# Patient Record
Sex: Female | Born: 1965 | Race: White | Hispanic: No | Marital: Married | Smoking: Never smoker
Health system: Southern US, Community
[De-identification: ages and names within clinical notes are randomized; demographics above are authoritative.]

## PROBLEM LIST (undated history)

## (undated) DIAGNOSIS — Z1389 Encounter for screening for other disorder: Secondary | ICD-10-CM

## (undated) DIAGNOSIS — Z8052 Family history of malignant neoplasm of bladder: Secondary | ICD-10-CM

## (undated) DIAGNOSIS — Z800 Family history of malignant neoplasm of digestive organs: Secondary | ICD-10-CM

## (undated) DIAGNOSIS — C801 Malignant (primary) neoplasm, unspecified: Secondary | ICD-10-CM

## (undated) DIAGNOSIS — Z1371 Encounter for nonprocreative screening for genetic disease carrier status: Secondary | ICD-10-CM

## (undated) DIAGNOSIS — C50919 Malignant neoplasm of unspecified site of unspecified female breast: Secondary | ICD-10-CM

## (undated) DIAGNOSIS — D649 Anemia, unspecified: Secondary | ICD-10-CM

## (undated) DIAGNOSIS — Z803 Family history of malignant neoplasm of breast: Secondary | ICD-10-CM

## (undated) DIAGNOSIS — D249 Benign neoplasm of unspecified breast: Secondary | ICD-10-CM

## (undated) DIAGNOSIS — L570 Actinic keratosis: Secondary | ICD-10-CM

## (undated) HISTORY — DX: Family history of malignant neoplasm of breast: Z80.3

## (undated) HISTORY — DX: Actinic keratosis: L57.0

## (undated) HISTORY — DX: Encounter for screening for other disorder: Z13.89

## (undated) HISTORY — DX: Malignant neoplasm of unspecified site of unspecified female breast: C50.919

## (undated) HISTORY — PX: DILATION AND CURETTAGE OF UTERUS: SHX78

## (undated) HISTORY — DX: Family history of malignant neoplasm of bladder: Z80.52

## (undated) HISTORY — DX: Benign neoplasm of unspecified breast: D24.9

## (undated) HISTORY — PX: MASTECTOMY: SHX3

## (undated) HISTORY — DX: Encounter for nonprocreative screening for genetic disease carrier status: Z13.71

## (undated) HISTORY — DX: Family history of malignant neoplasm of digestive organs: Z80.0

---

## 2004-04-04 ENCOUNTER — Ambulatory Visit: Payer: Self-pay | Admitting: Internal Medicine

## 2005-01-08 ENCOUNTER — Ambulatory Visit: Payer: Self-pay

## 2005-01-08 IMAGING — MG UNKNOWN MG STUDY
1 series · 4 of 4 positions shown · non-contrast
Comparison: none

REASON FOR EXAM: Screening  Dr EPSOLAM  Report to EPSOLAM
COMMENTS:

[R CC · right · 4 of 4 slices shown]
[im 1/4]
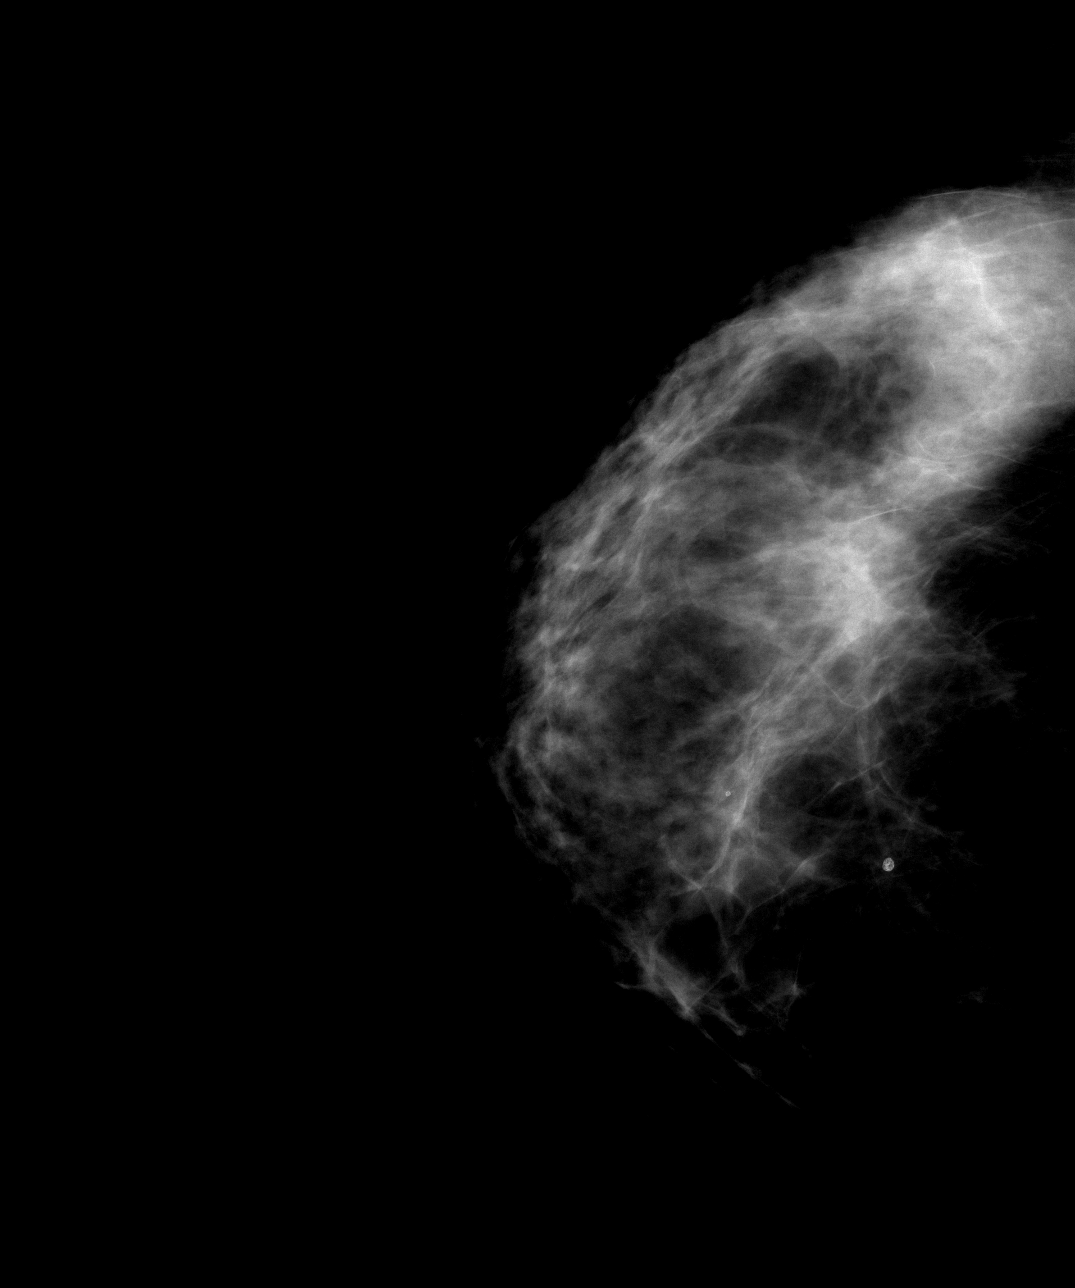
[im 2/4]
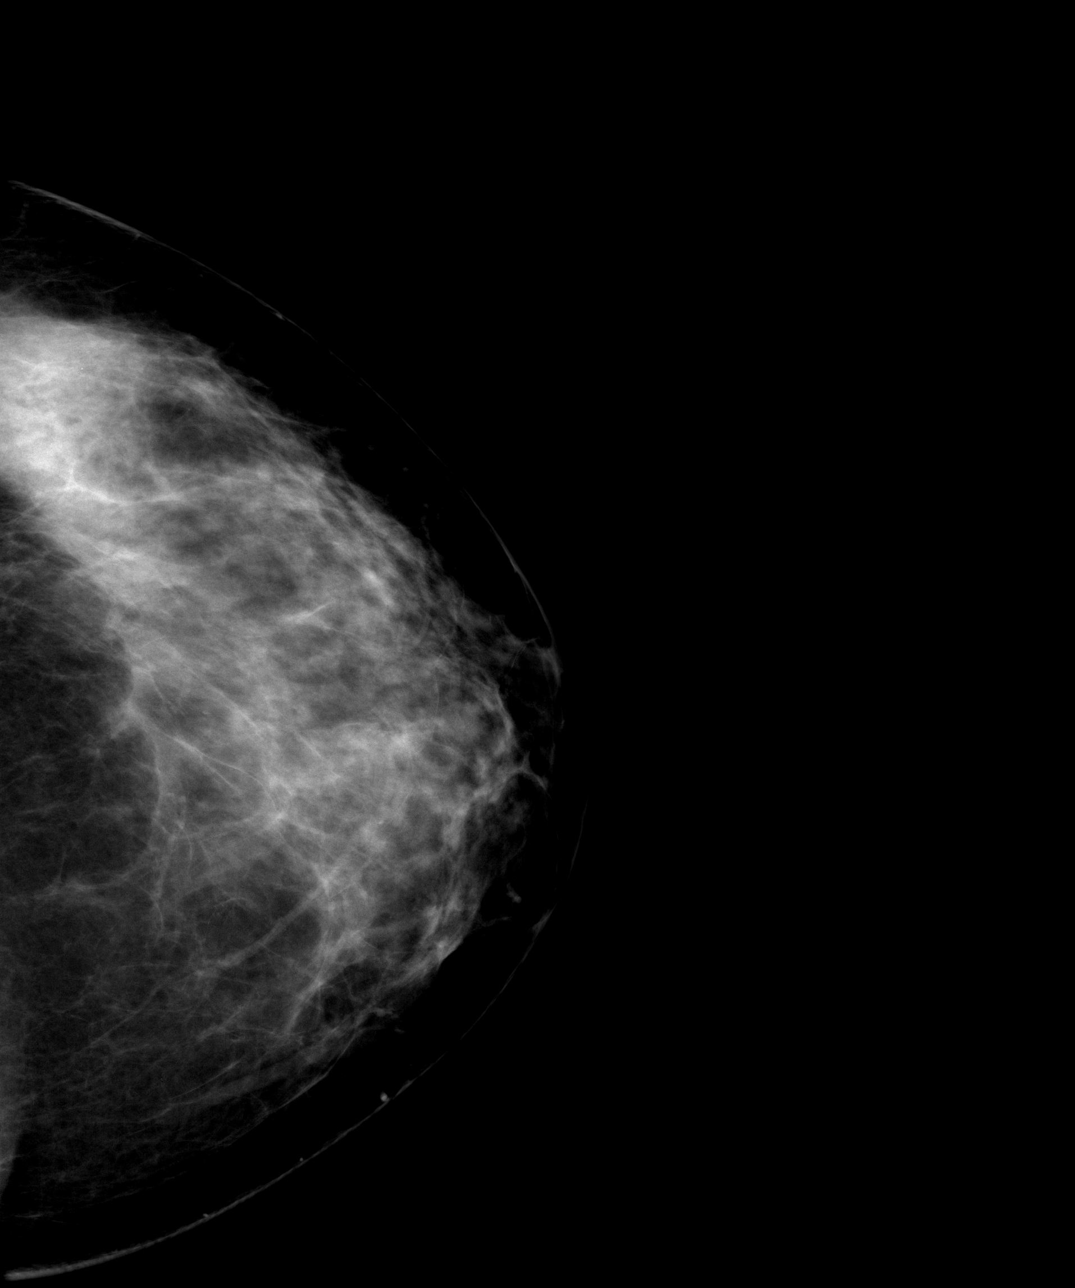
[im 3/4]
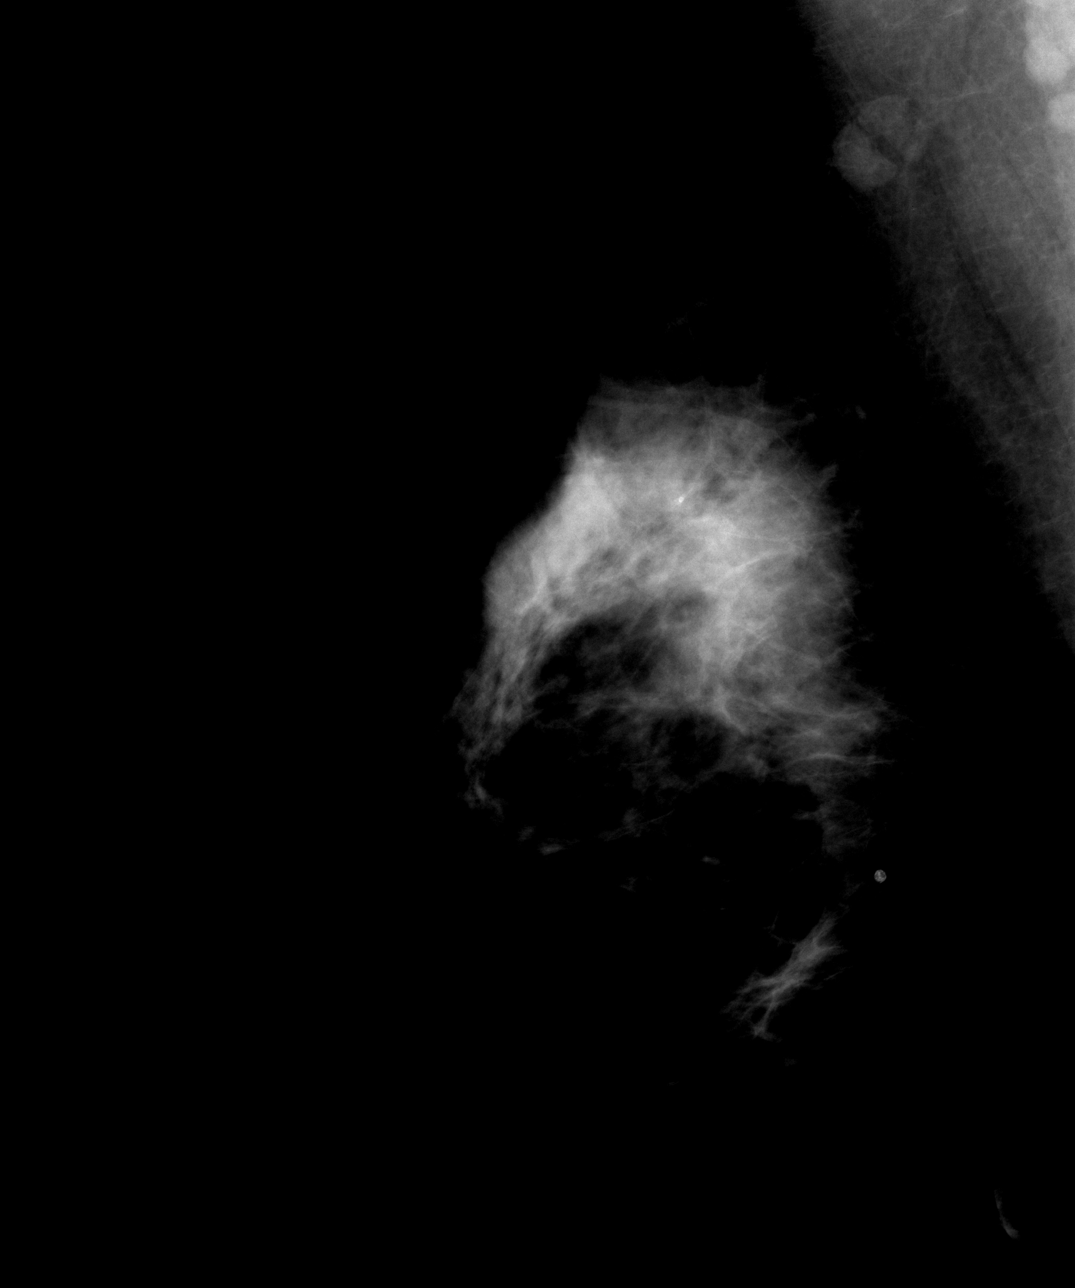
[im 4/4]
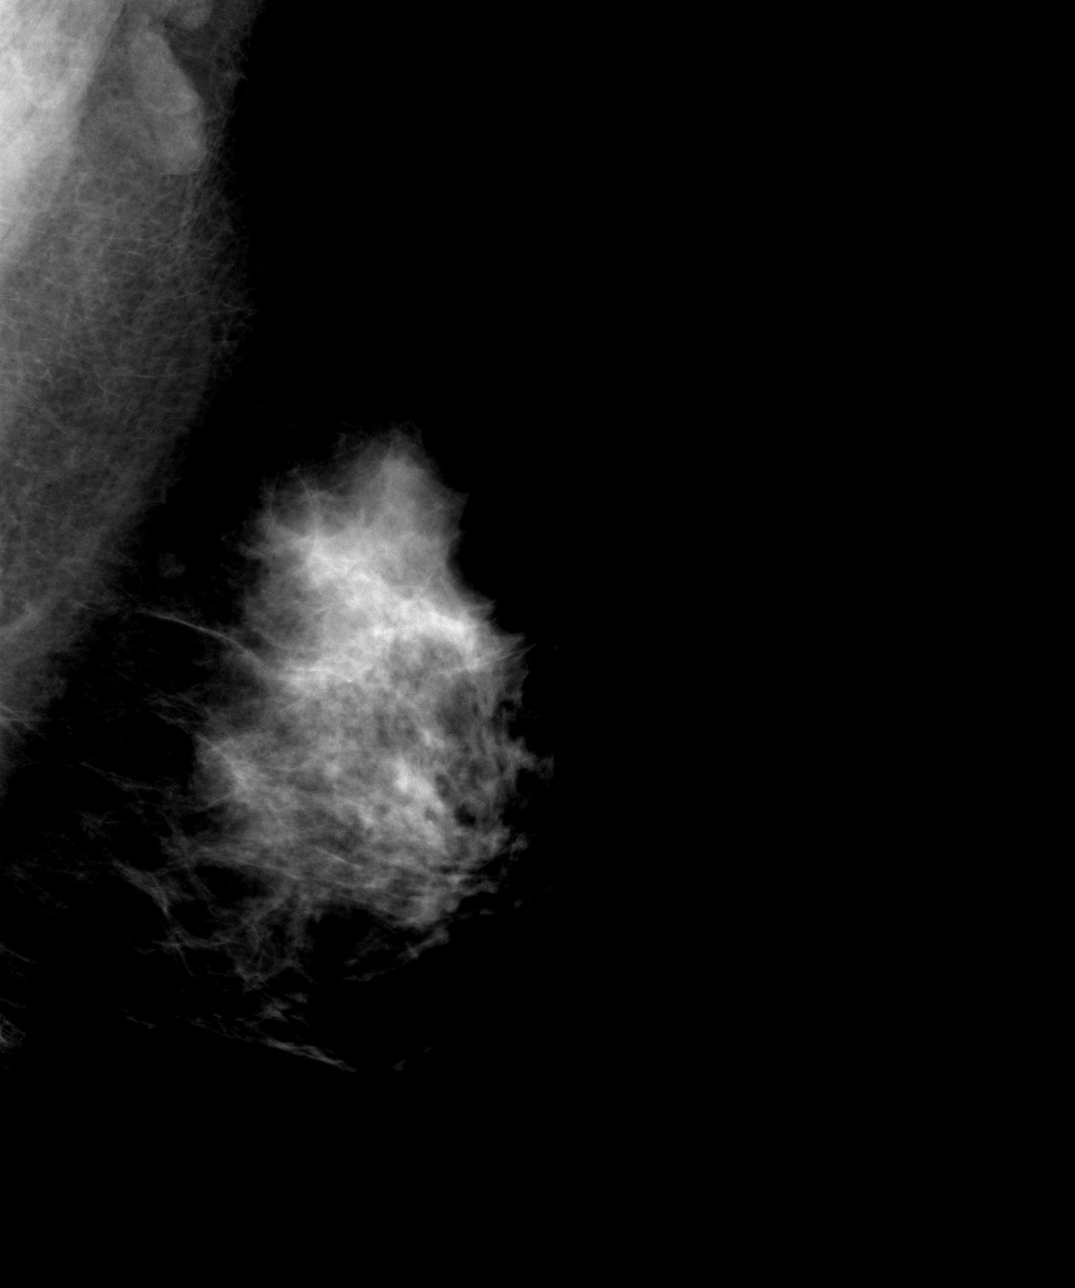

[4 of 4 positions shown; findings below may reference images not displayed]

PROCEDURE:     MAM - MAM DGTL SCREENING MAMMO W/CAD  - [DATE]  [DATE]

RESULT:     Mild fullness in the upper outer aspect of the RIGHT breast is
noted.  It is suggested that compression spot films be obtained for further
evaluation. If need be ultrasound can also be obtained.  A small nodule
noted in the upper aspect of the LEFT breast is stable, consistent with
intramammary lymph node.
IMPRESSION: 1)Increased density in the upper outer aspect of the RIGHT breast for which
compression spot films and if need ultrasound is suggested for further
evaluation.

BI-RADS: Category 0-Needs Additional Imaging Evaluation

A NEGATIVE MAMMOGRAM REPORT DOES NOT PRECLUDE BIOPSY OR OTHER EVALUATION OF
A CLINICALLY PALPABLE OR OTHERWISE SUSPICIOUS MASS OR LESION.  BREAST CANCER
MAY NOT BE DETECTED BY MAMMOGRAPHY IN UP TO 10% OF CASES.

## 2006-03-18 ENCOUNTER — Ambulatory Visit: Payer: Self-pay

## 2007-05-24 ENCOUNTER — Ambulatory Visit: Payer: Self-pay

## 2008-05-24 ENCOUNTER — Ambulatory Visit: Payer: Self-pay

## 2009-05-25 HISTORY — PX: ABDOMINAL HYSTERECTOMY: SHX81

## 2009-06-18 ENCOUNTER — Ambulatory Visit: Payer: Self-pay

## 2009-09-10 ENCOUNTER — Ambulatory Visit: Payer: Self-pay

## 2009-09-17 ENCOUNTER — Ambulatory Visit: Payer: Self-pay

## 2010-09-03 ENCOUNTER — Ambulatory Visit: Payer: Self-pay

## 2011-05-26 DIAGNOSIS — Z1389 Encounter for screening for other disorder: Secondary | ICD-10-CM

## 2011-05-26 DIAGNOSIS — Z803 Family history of malignant neoplasm of breast: Secondary | ICD-10-CM

## 2011-05-26 DIAGNOSIS — D249 Benign neoplasm of unspecified breast: Secondary | ICD-10-CM

## 2011-05-26 HISTORY — DX: Encounter for screening for other disorder: Z13.89

## 2011-05-26 HISTORY — PX: BREAST BIOPSY: SHX20

## 2011-05-26 HISTORY — DX: Family history of malignant neoplasm of breast: Z80.3

## 2011-05-26 HISTORY — DX: Benign neoplasm of unspecified breast: D24.9

## 2011-10-02 ENCOUNTER — Ambulatory Visit: Payer: Self-pay

## 2011-10-02 IMAGING — MG MAM DGTL SCREENING MAMMO W/CAD
1 series · 4 of 4 positions shown · non-contrast
Comparison: [DATE], [DATE], [DATE], [DATE].

REASON FOR EXAM: screening
COMMENTS:  Submitted by practice: [facility] Scheduled by user: AKIHIRU
AKIHIRU

PROCEDURE:     MAM - MAM DGTL SCREENING MAMMO W/CAD  - [DATE]  [DATE]
RESULT:

[Series 6275: R CC · right · 4 of 4 slices shown]
[im 1/4]
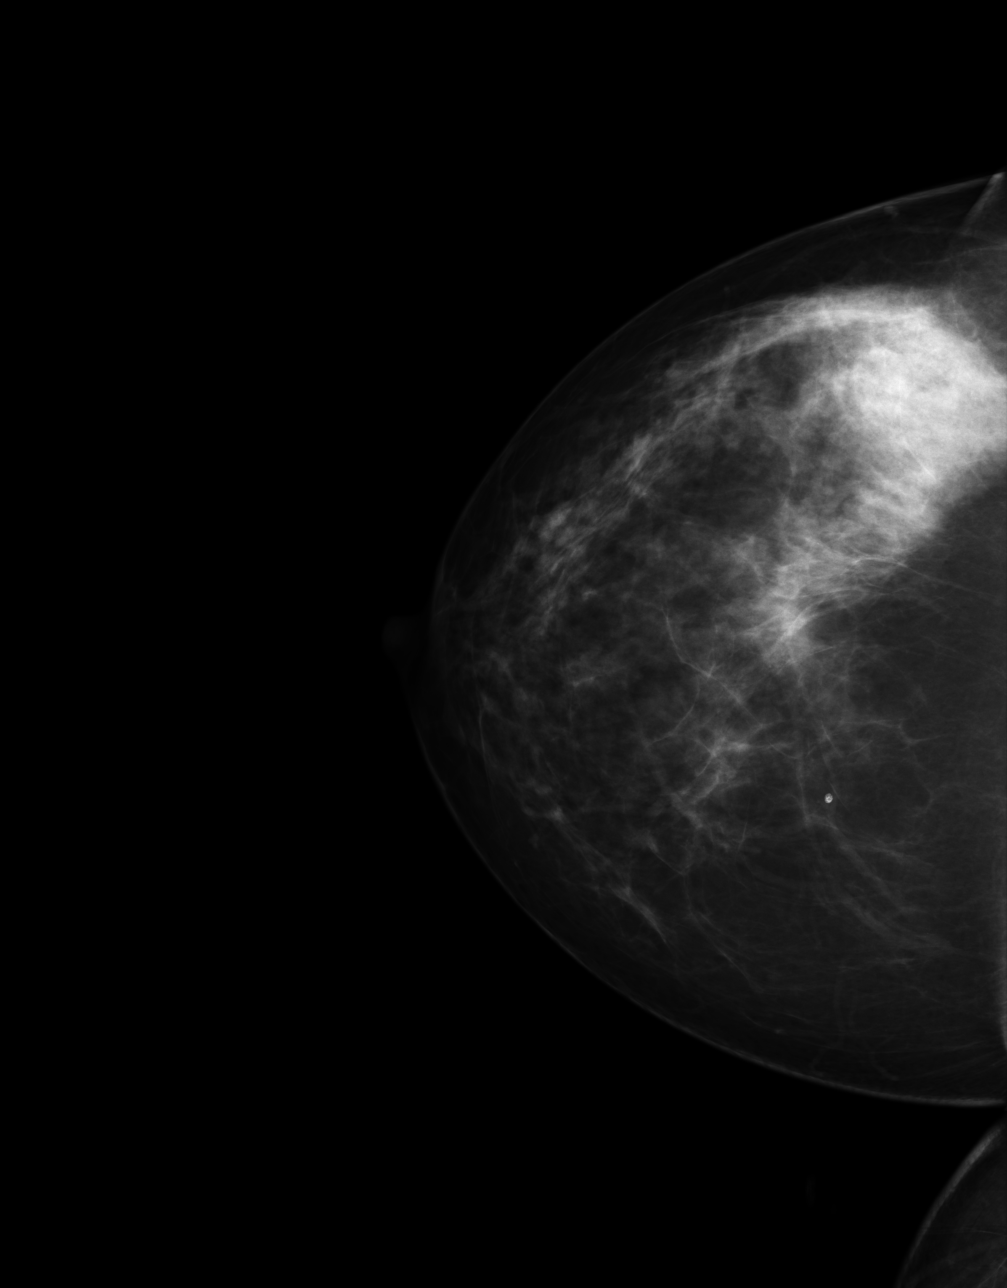
[im 2/4]
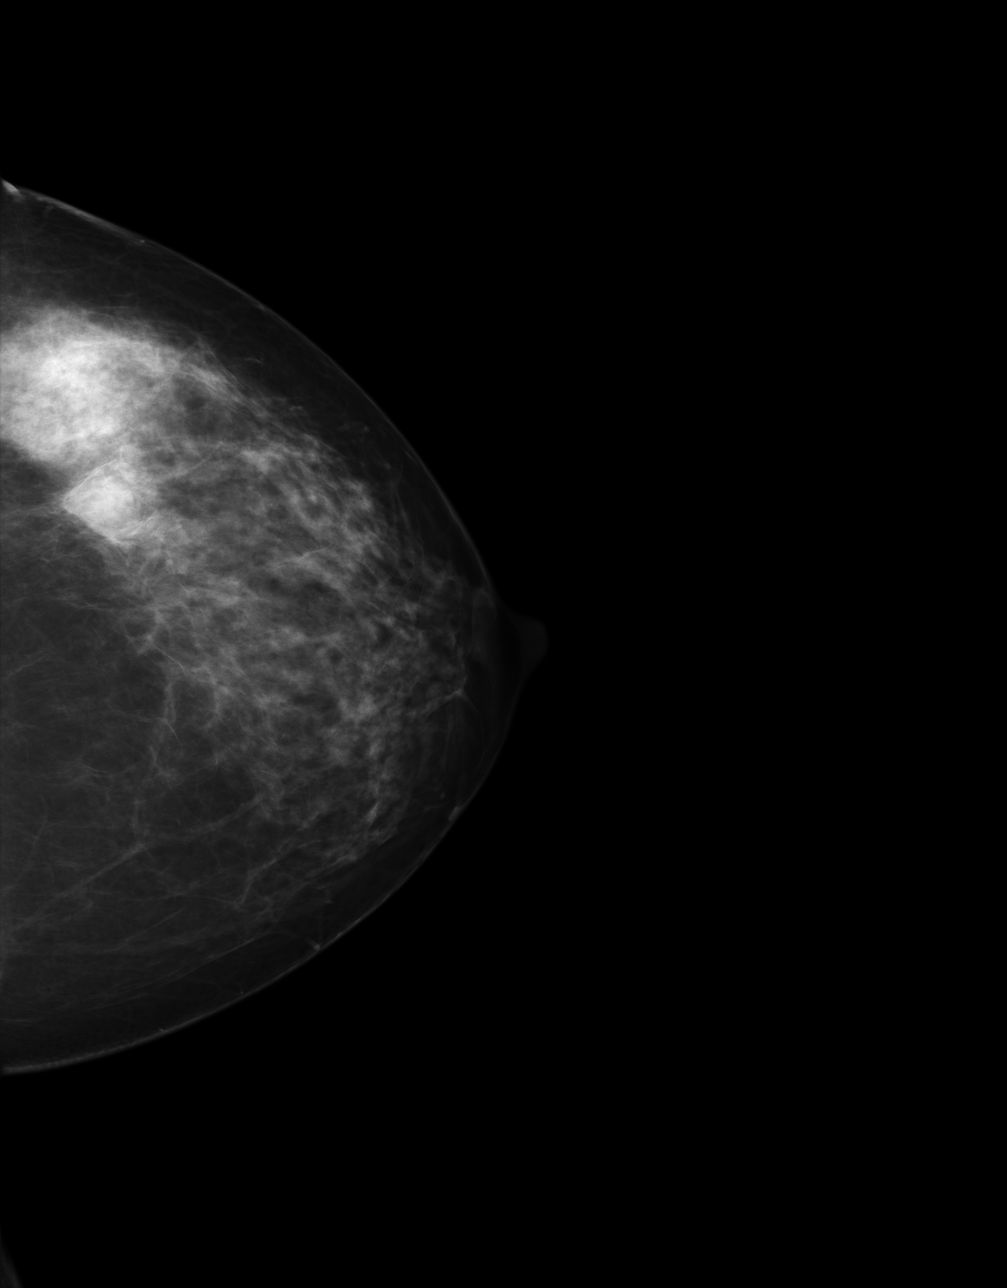
[im 3/4]
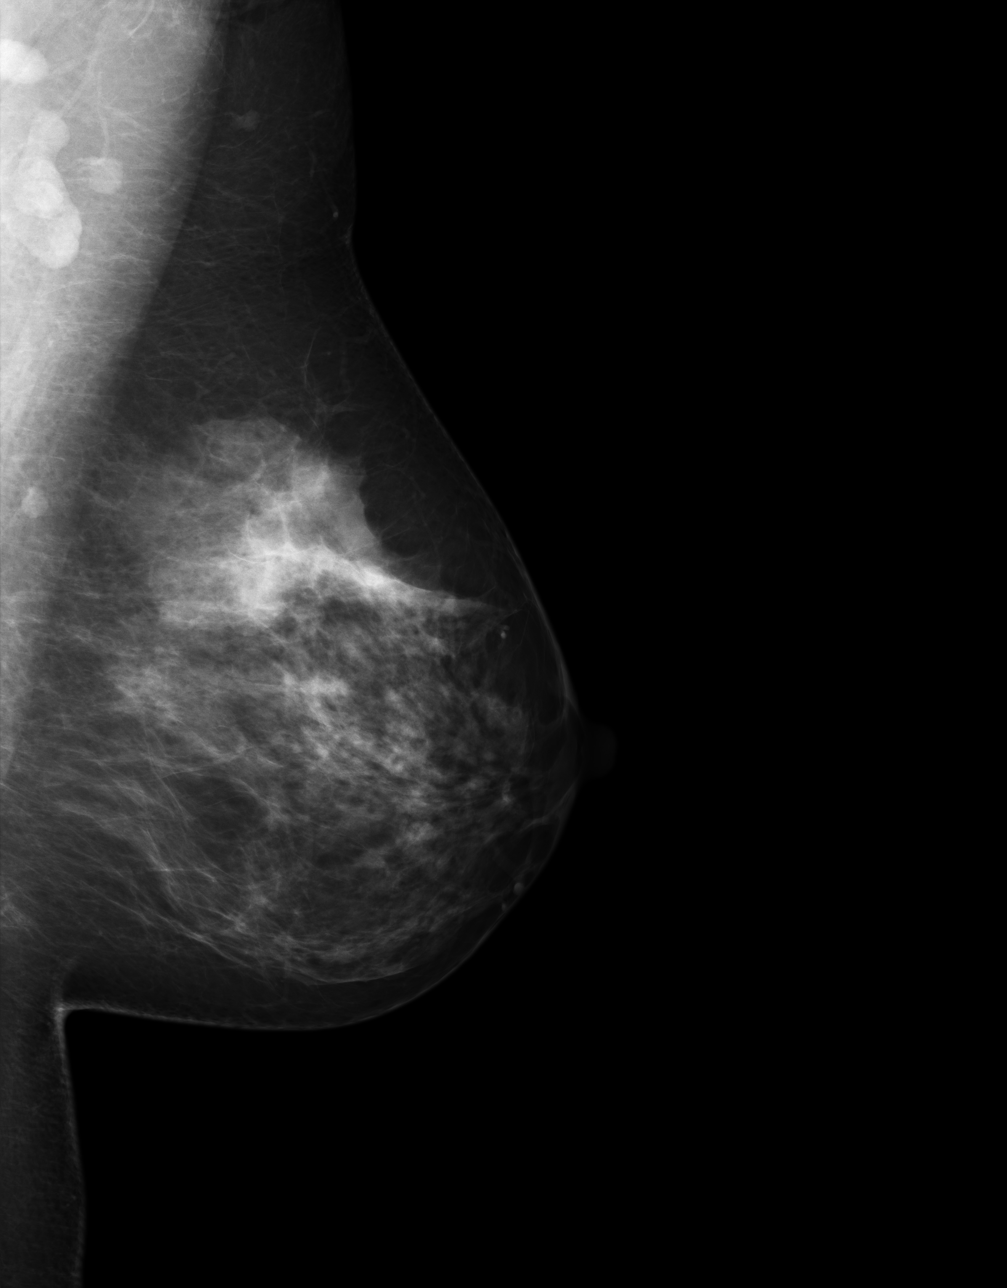
[im 4/4]
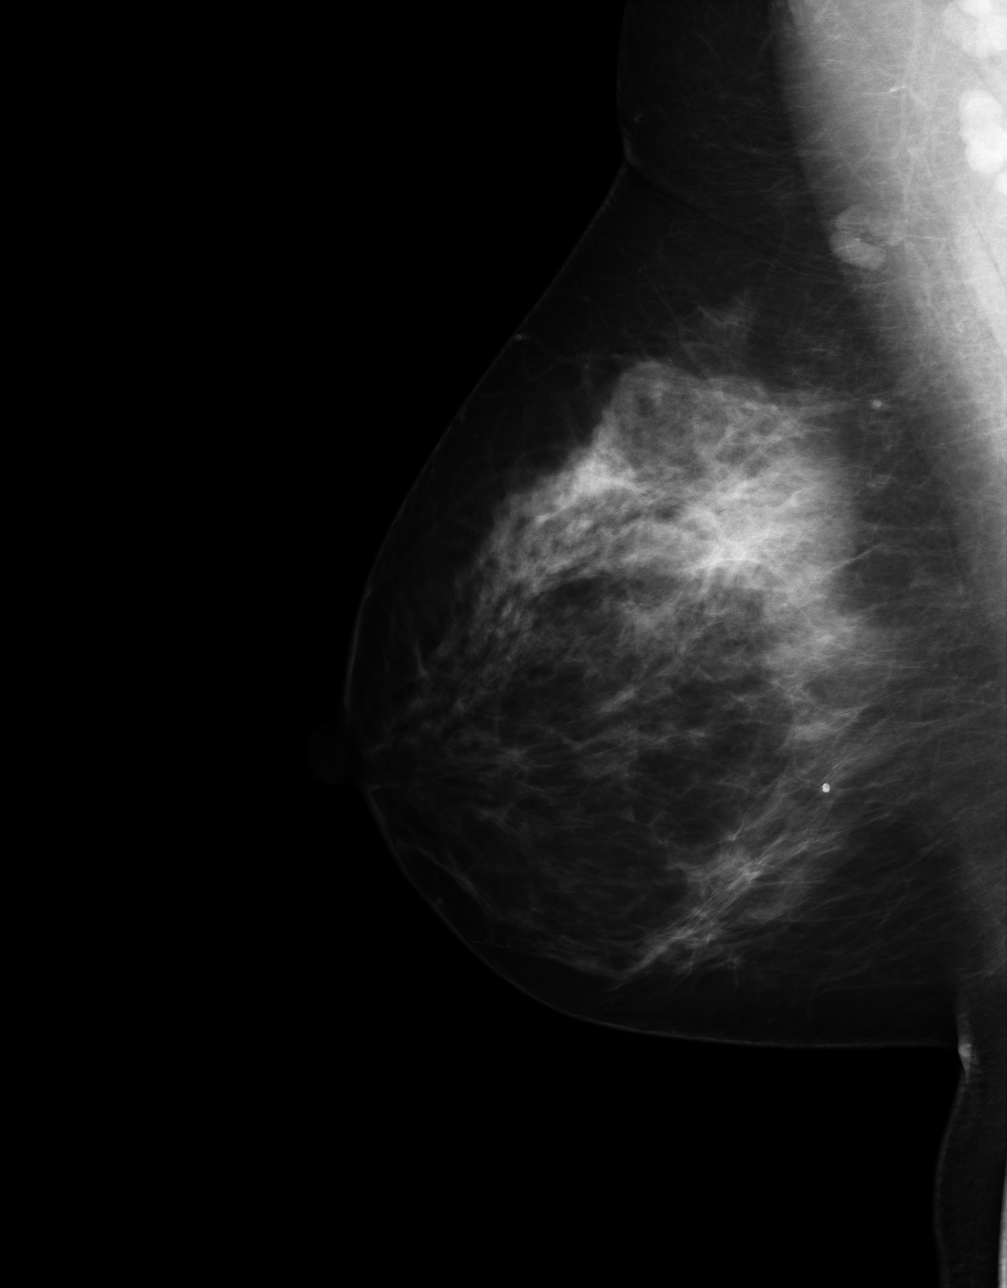

[4 of 4 positions shown; findings below may reference images not displayed]

FINDINGS: The breast tissue is heterogeneously dense. There is a small, round
asymmetry in the lateral aspect of the left CC view, posterior depth. This
likely represents an area of confluence of fibroglandular tissue. No
definite correlate seen on the MLO view. No suspicious masses or
calcifications are identified in the right breast.
IMPRESSION: BI-RADS: Category 0 - Needs Additional Imaging Evaluation

RECOMMENDATION:  True lateral and spot compression magnification views of
the small asymmetry in the left CC view.

Thank you for this opportunity to contribute to the care of your patient.

A NEGATIVE MAMMOGRAM REPORT DOES NOT PRECLUDE BIOPSY OR OTHER EVALUATION OF
A CLINICALLY PALPABLE OR OTHERWISE SUSPICIOUS MASS OR LESION. BREAST CANCER
MAY NOT BE DETECTED BY MAMMOGRAPHY IN UP TO 10% OF CASES.

## 2011-10-07 ENCOUNTER — Ambulatory Visit: Payer: Self-pay

## 2011-10-07 IMAGING — US ULTRASOUND LEFT BREAST
1 series · 13 of 25 positions shown · non-contrast
Comparison: none

REASON FOR EXAM: AV LT ASYMMETRY
COMMENTS:

PROCEDURE:     US  - US BREAST LEFT  - [DATE] [DATE]
RESULT:
COMPARISON STUDY:     [DATE], [DATE], [DATE], [DATE].

[Series 1: ultrasound left breast · 0.09mm/px · 13 of 28 slices shown]
[im 1/28]
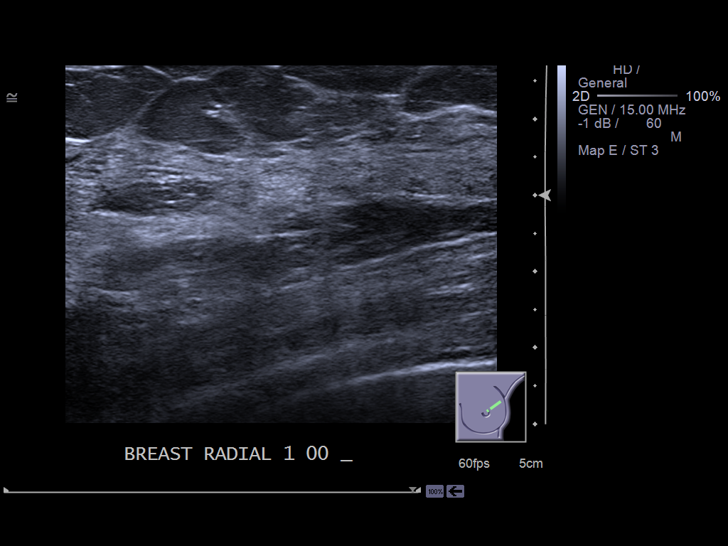
[im 3/28]
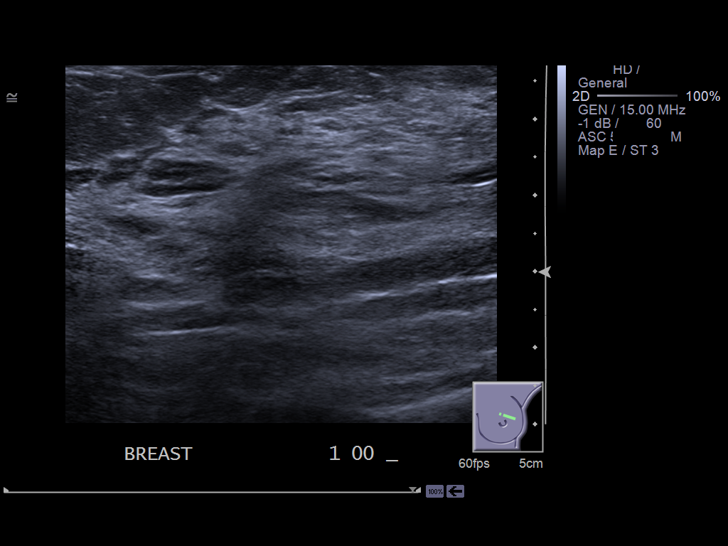
[im 5/28]
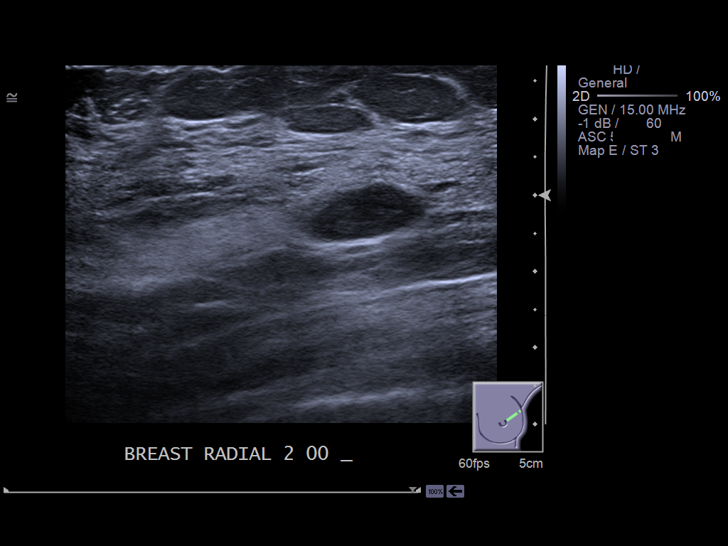
[im 7/28]
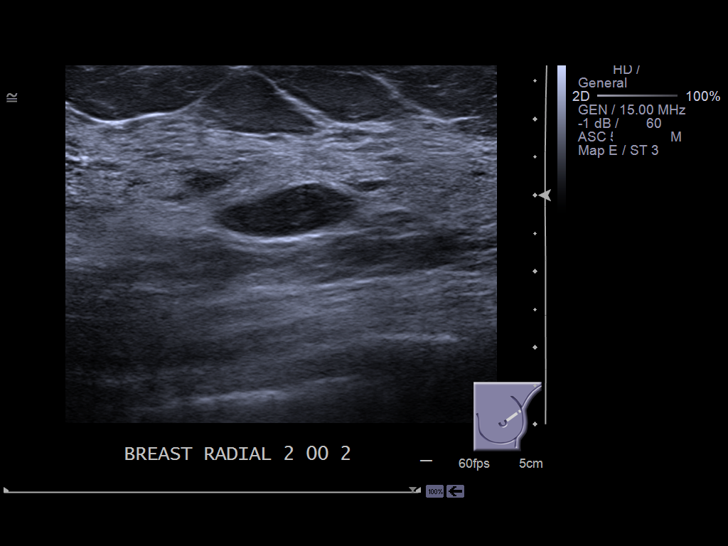
[im 10/28]
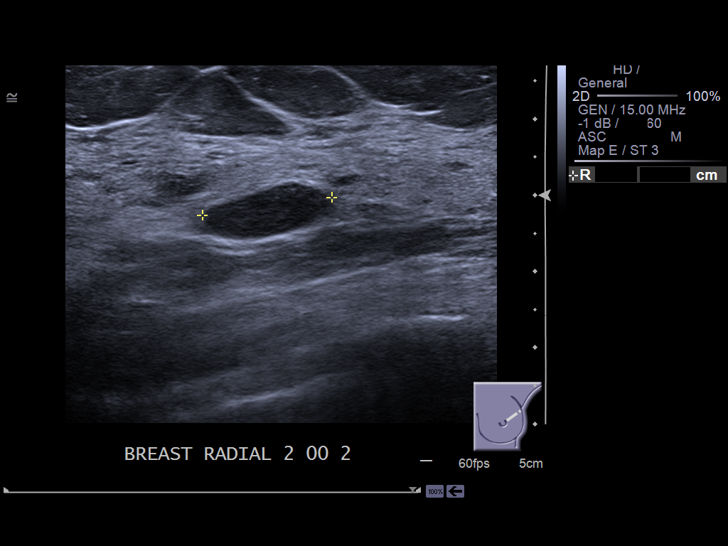
[im 12/28]
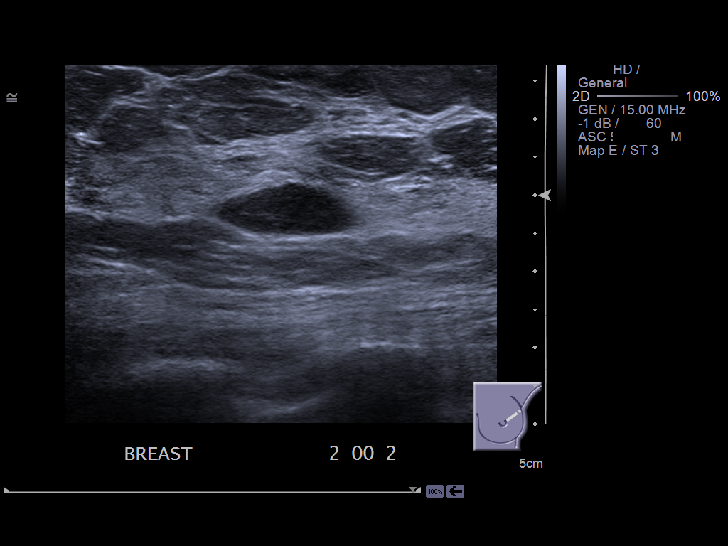
[im 14/28]
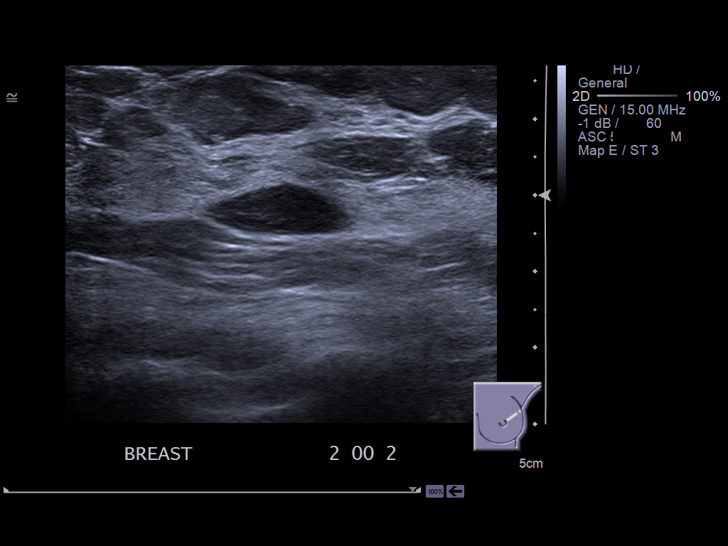
[im 16/28]
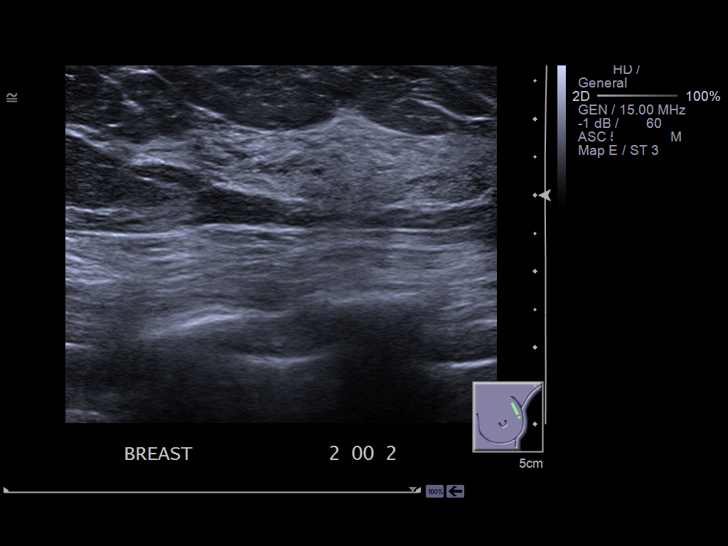
[im 19/28]
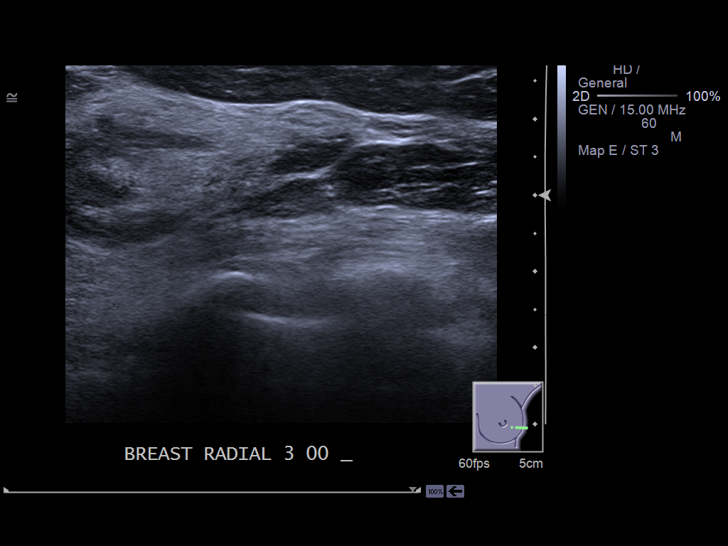
[im 21/28]
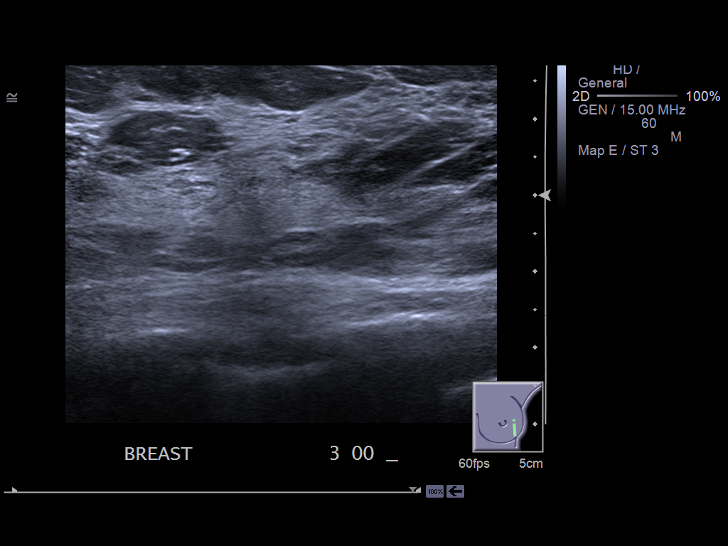
[im 23/28]
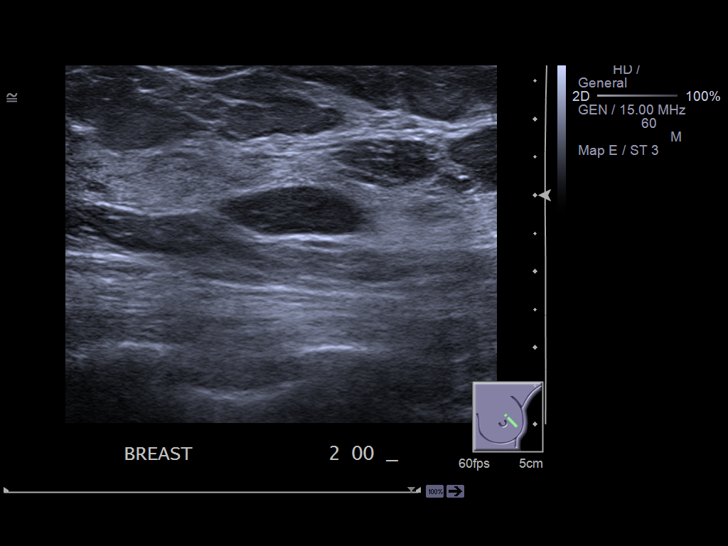
[im 25/28]
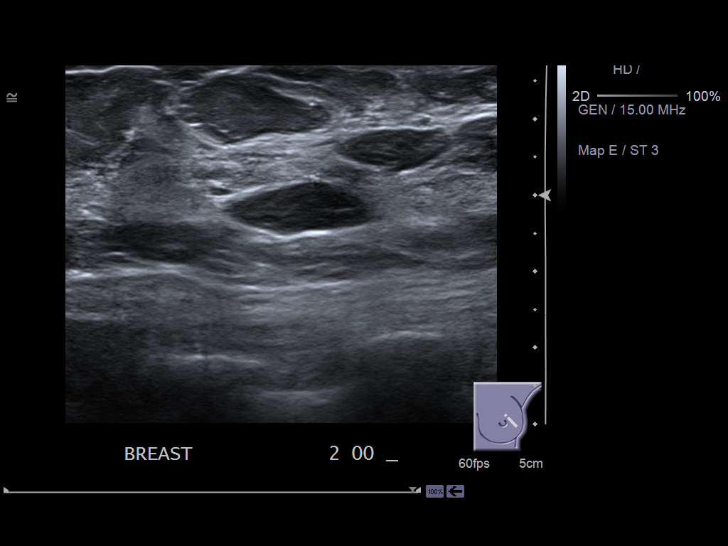
[im 28/28]
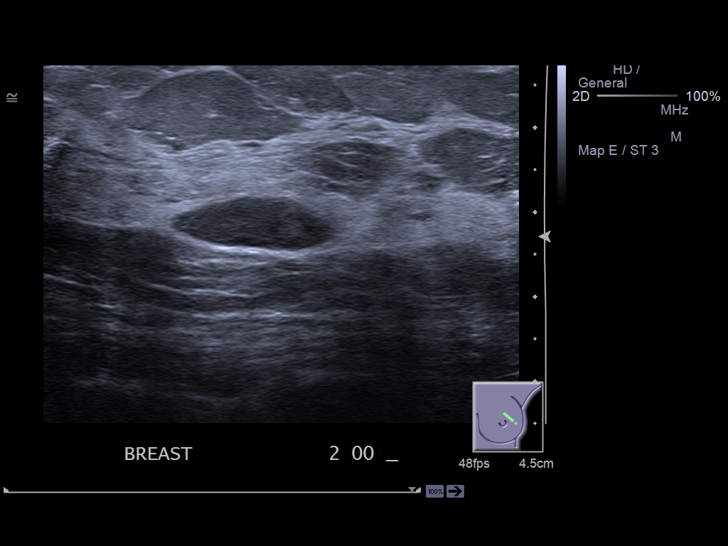

[13 of 25 positions shown; findings below may reference images not displayed]

FINDINGS: True lateral and spot compression magnification views were
performed to evaluate the findings in the left breast on the screening
mammograms. The spot compression magnification CC view demonstrated a
well-circumscribed oval mass in the lateral aspect of the left breast. Spot
compression magnification views in the MLO and true lateral views
demonstrate this mass to be just above the level of the nipple line at
approximately [DATE]. The majority of the borders are well-circumscribed,
although some of the borders are obscured by overlapping fibroglandular
tissue.

Real-time ultrasound was performed of the superior lateral left breast from
[DATE] through [DATE]. This demonstrated an oval hypoechoic mass at [DATE]. The
mass measures 1.8 x 1.7 x 0.7 cm. The borders are well-circumscribed. There
does not appear to be significant posterior acoustic shadowing or posterior
acoustic enhancement.
IMPRESSION: 1.  BIRADS:   Category 4-Suspicious Abnormality.
2.  Surgical consultation and tissue diagnosis are recommended for the small
oval mass in the superolateral left breast at approximately [DATE]. This could
represent a fibroadenoma, but is indeterminate, and tissue diagnosis is
recommended.

## 2011-10-16 HISTORY — PX: BREAST SURGERY: SHX581

## 2012-08-09 ENCOUNTER — Encounter: Payer: Self-pay | Admitting: *Deleted

## 2012-08-24 ENCOUNTER — Encounter: Payer: Self-pay | Admitting: General Surgery

## 2012-10-04 ENCOUNTER — Ambulatory Visit: Payer: Self-pay | Admitting: General Surgery

## 2012-10-04 IMAGING — MG MM CAD SCREENING MAMMO
1 series · 4 of 4 positions shown · non-contrast
Comparison: none

REASON FOR EXAM: SCR MAMMO NO ORDER
COMMENTS:

PROCEDURE:     MAM - MAM DGTL SCRN MAM NO ORDER W/CAD  - [DATE]  [DATE]
RESULT:
Comparisons: [DATE], [DATE], [DATE], [DATE], and left breast
ultrasound [DATE].

[R CC · right · 4 of 4 slices shown]
[im 1/4]
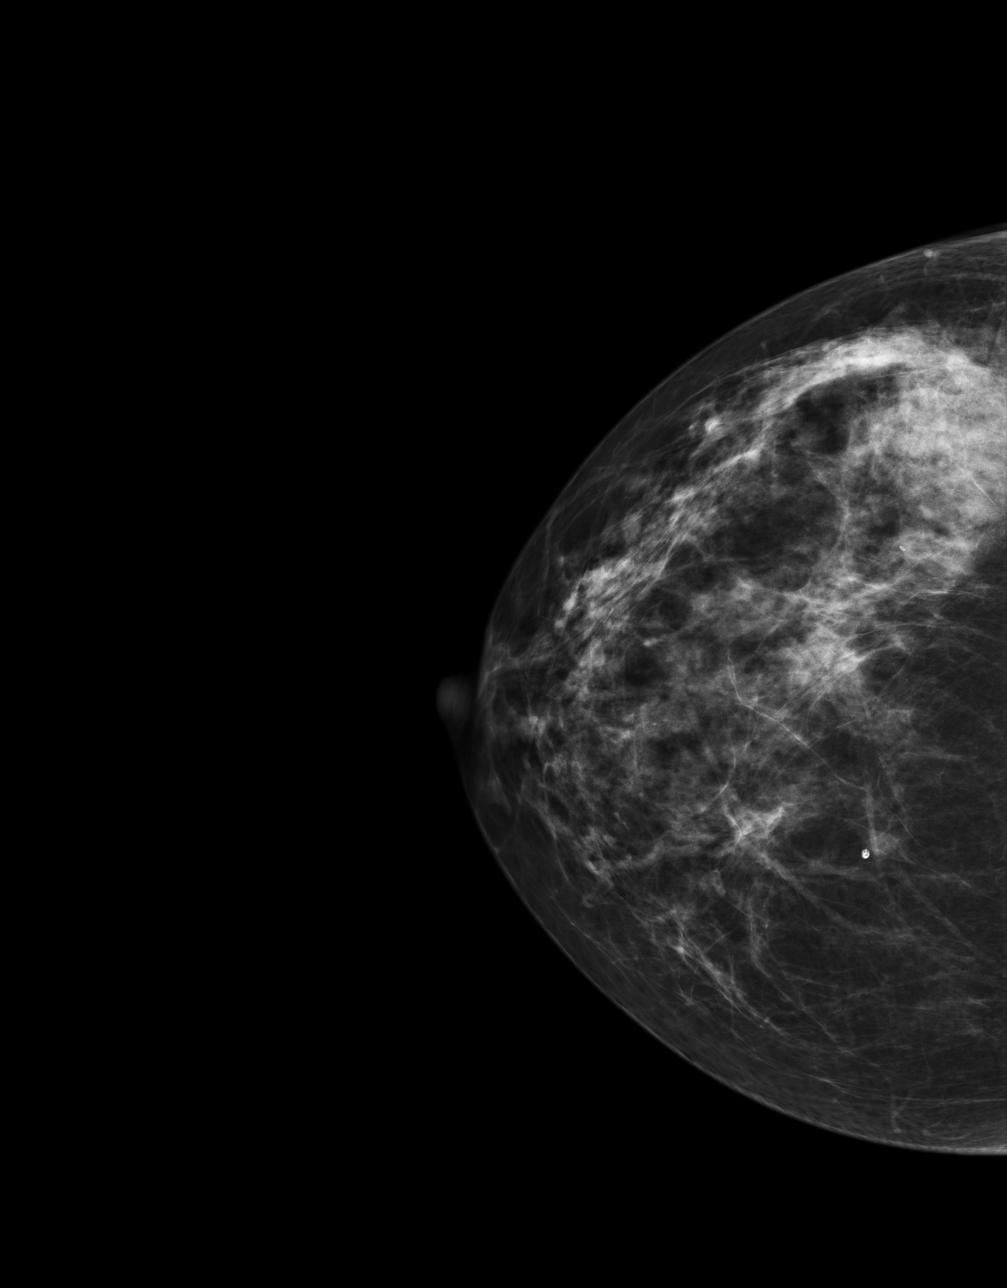
[im 2/4]
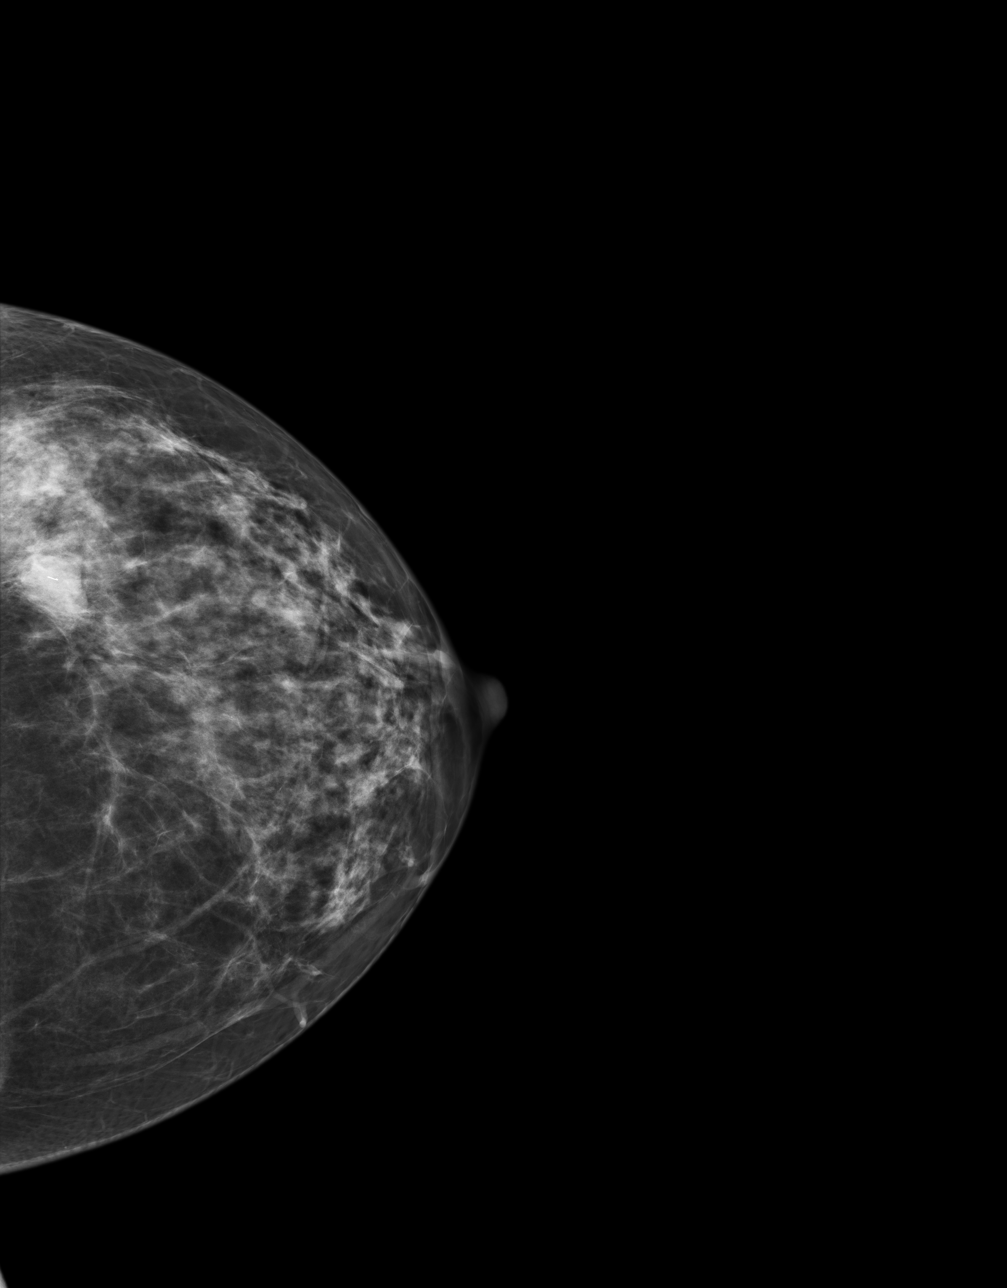
[im 3/4]
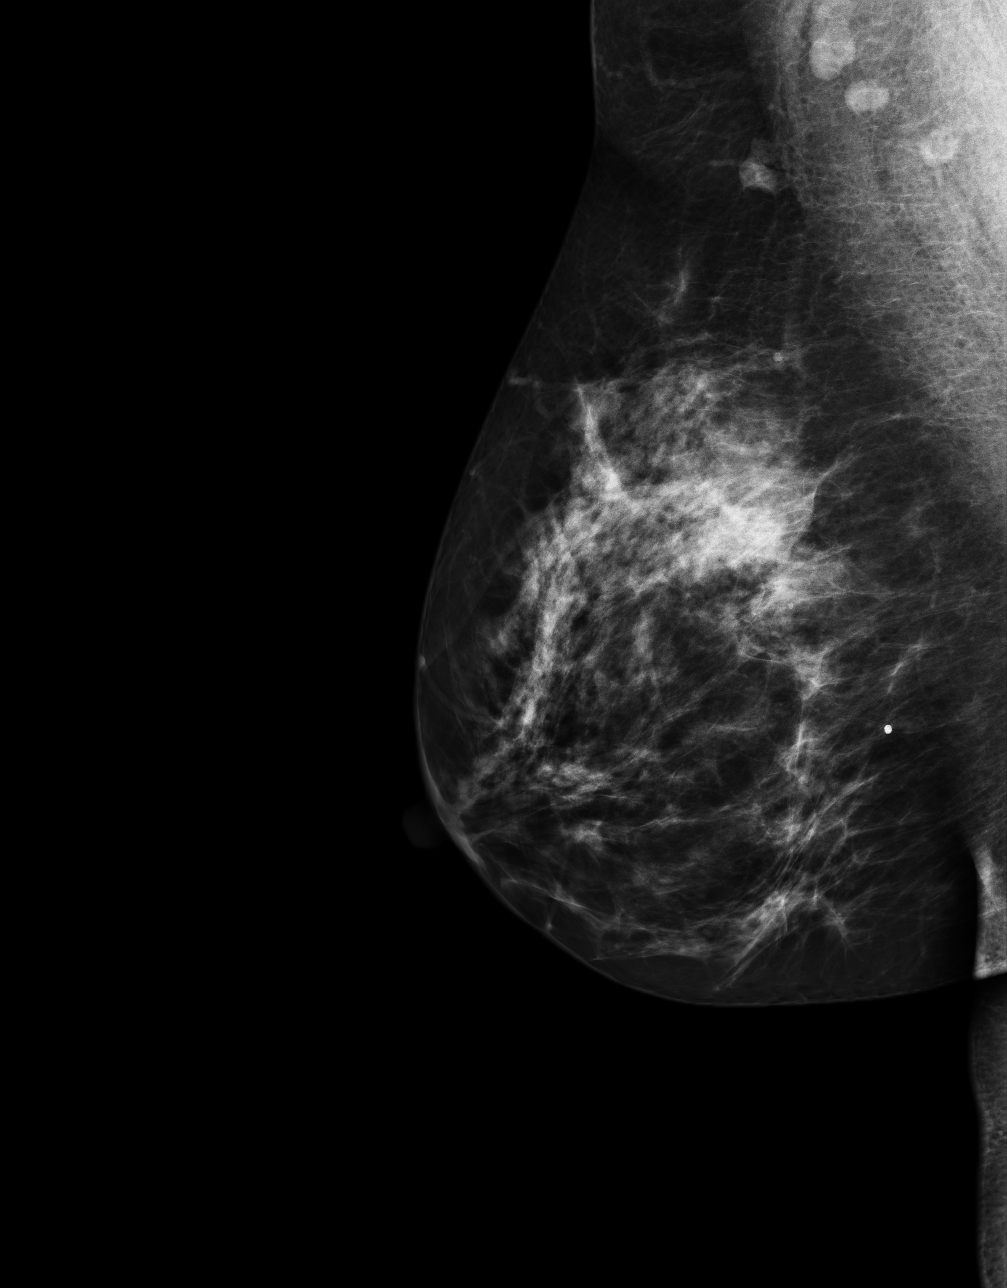
[im 4/4]
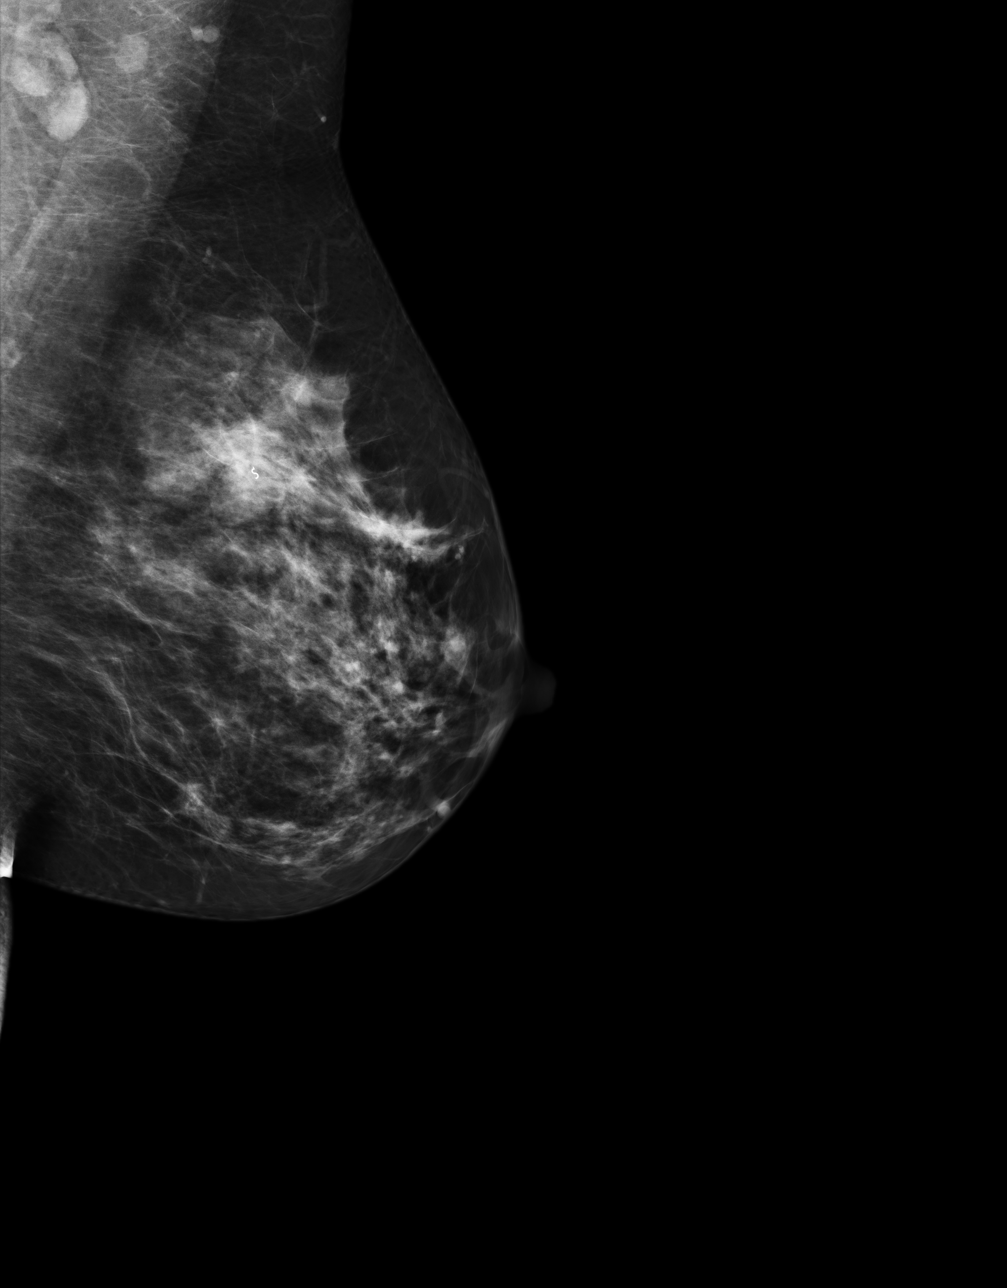

[4 of 4 positions shown; findings below may reference images not displayed]

FINDINGS: The breast tissue is heterogeneously dense, which may lower the sensitivity
of mammography. A biopsy clip marker overlies the mass in the superolateral
left breast. The biopsy results were benign. The mass is similar in size to
prior. No suspicious masses or calcifications are identified. No areas of
architectural distortion.
IMPRESSION: BI-RADS: Category 2 - Benign Finding.

BREAST COMPOSITION: The breast composition is HETEROGENEOUSLY DENSE
(glandular tissue is 51-75%) This may decrease the sensitivity of
mammography.

Recommend continued annual screening mammography.

A NEGATIVE MAMMOGRAM REPORT DOES NOT PRECLUDE BIOPSY OR OTHER EVALUATION OF
A CLINICALLY PALPABLE OR OTHERWISE SUSPICIOUS MASS OR LESION. BREAST CANCER
MAY NOT BE DETECTED BY MAMMOGRAPHY IN UP TO 10% OF CASES.

[REDACTED]

## 2012-10-12 ENCOUNTER — Encounter: Payer: Self-pay | Admitting: General Surgery

## 2012-10-27 ENCOUNTER — Ambulatory Visit: Payer: 59 | Admitting: General Surgery

## 2012-10-27 ENCOUNTER — Encounter: Payer: Self-pay | Admitting: General Surgery

## 2012-10-27 VITALS — BP 110/78 | Ht 67.0 in | Wt 188.0 lb

## 2012-10-27 DIAGNOSIS — D242 Benign neoplasm of left breast: Secondary | ICD-10-CM

## 2012-10-27 DIAGNOSIS — Z803 Family history of malignant neoplasm of breast: Secondary | ICD-10-CM

## 2012-10-27 DIAGNOSIS — D249 Benign neoplasm of unspecified breast: Secondary | ICD-10-CM | POA: Insufficient documentation

## 2012-10-27 NOTE — Progress Notes (Signed)
  Patient ID: Emma Cuevas, female   DOB: 09-19-1965, 47 y.o.   MRN: [id number]  Chief Complaint  Patient presents with  . Other    mammogram    HPI Emma Cuevas is a 47 y.o. female here today for her follow up mammogram done on 10/07/12. Patient states no new breast problems.Patienty had a left breast mass last year.Had a core biopsy showing a fibroadenoma. Family history of breast cancer. HPI  Past Medical History  Diagnosis Date  . Family history of malignant neoplasm of breast 2013  . Breast mass, left 2013    benign  . Screening for obesity 2013  . Obesity, unspecified 2013  . Lump or mass in breast 2013    left breast  . Breast screening, unspecified 2013  . Benign neoplasm of breast 2013    left breast    Past Surgical History  Procedure Laterality Date  . Breast surgery Left 10/16/2011    left breast finesse biopsy  . Abdominal hysterectomy  2011    partial    Family History  Problem Relation Age of Onset  . Breast cancer Mother 45  . Breast cancer Maternal Aunt 39    Social History History  Substance Use Topics  . Smoking status: Never Smoker   . Smokeless tobacco: Not on file  . Alcohol Use: No    No Known Allergies  Current Outpatient Prescriptions  Medication Sig Dispense Refill  . Calcium Carbonate (CALCIUM 600 PO) Take 1 tablet by mouth as directed.       No current facility-administered medications for this visit.    Review of Systems Review of Systems  Constitutional: Negative.   Respiratory: Negative.   Cardiovascular: Negative.     Blood pressure 110/78, height 5\' 7"  (1.702 m), weight 188 lb (85.276 kg).  Physical Exam Physical Exam  Constitutional: She appears well-developed and well-nourished.  Eyes: Conjunctivae are normal. No scleral icterus.  Neck: Neck supple.  Cardiovascular: Normal rate, regular rhythm and normal heart sounds.   Pulmonary/Chest: Breath sounds normal. Right breast exhibits no inverted nipple, no mass,  no nipple discharge and no skin change. Left breast exhibits no inverted nipple, no mass, no nipple discharge, no skin change and no tenderness.  Abdominal: Bowel sounds are normal.  Lymphadenopathy:    She has no cervical adenopathy.    She has no axillary adenopathy.    Data Reviewed Mammogram reviewed -left breast mass remains unchanged with clip in place.  Assessment   stable exam. Benign neoplasm left breast     Plan     Patient to return in one year bilateral screening mammogram.       Emma Cuevas G 10/27/2012, 11:56 AM

## 2012-10-27 NOTE — Patient Instructions (Addendum)
Patient to return in one year bilateral screening mammogram. Continued monthly self breast checks.

## 2013-05-25 DIAGNOSIS — Z1371 Encounter for nonprocreative screening for genetic disease carrier status: Secondary | ICD-10-CM

## 2013-05-25 HISTORY — DX: Encounter for nonprocreative screening for genetic disease carrier status: Z13.71

## 2013-11-06 ENCOUNTER — Ambulatory Visit: Payer: Self-pay | Admitting: General Surgery

## 2013-11-06 IMAGING — MG MM DIGITAL SCREENING BILAT W/ CAD
1 series · 4 of 4 positions shown · non-contrast
Comparison: Previous exam(s).

CLINICAL DATA: Screening.

EXAM:
DIGITAL SCREENING BILATERAL MAMMOGRAM WITH CAD

[R CC · right · 4 of 4 slices shown]
[im 1/4]
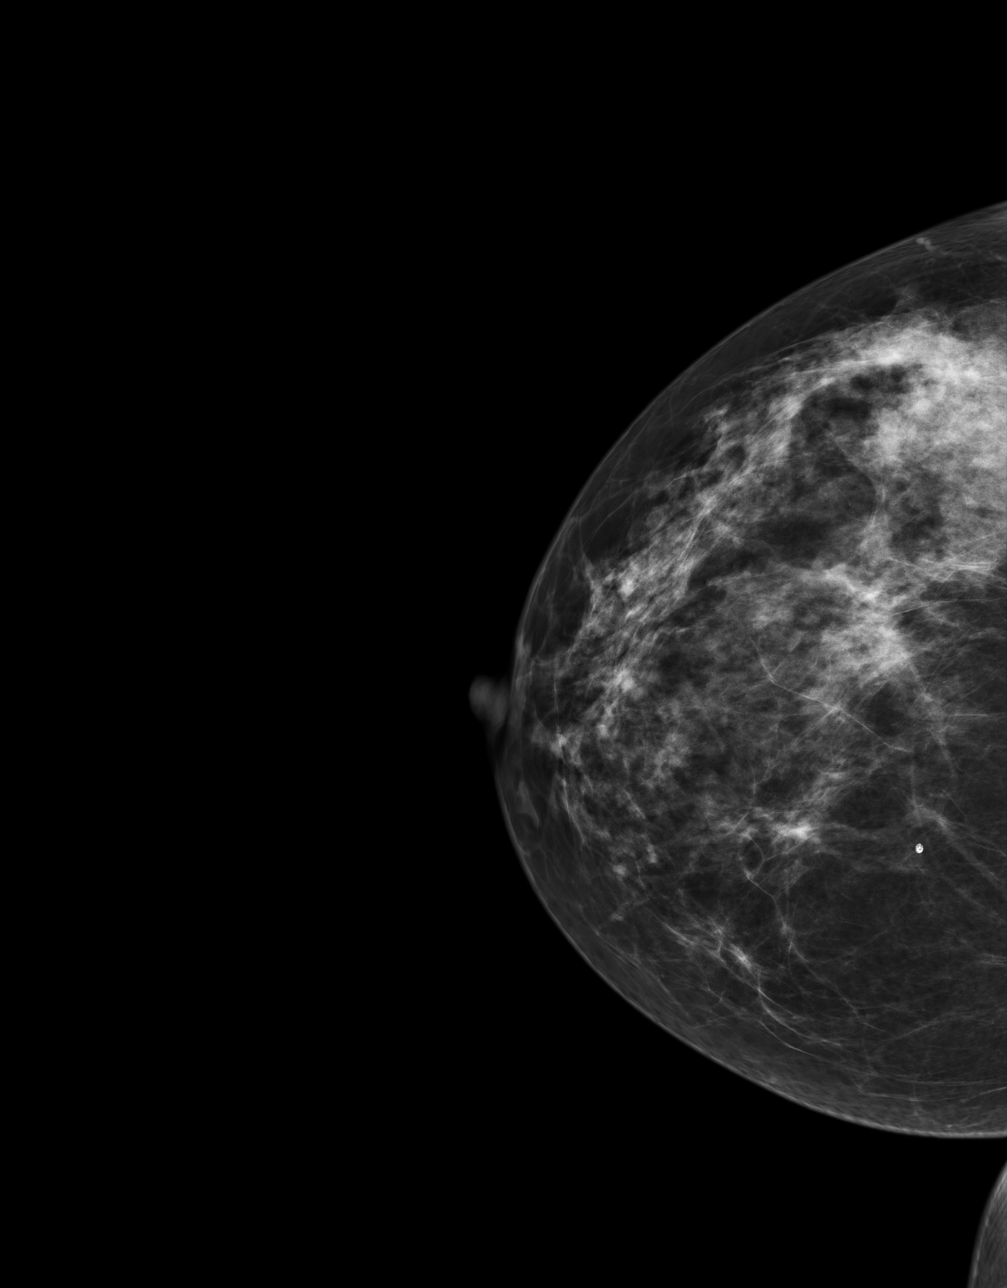
[im 2/4]
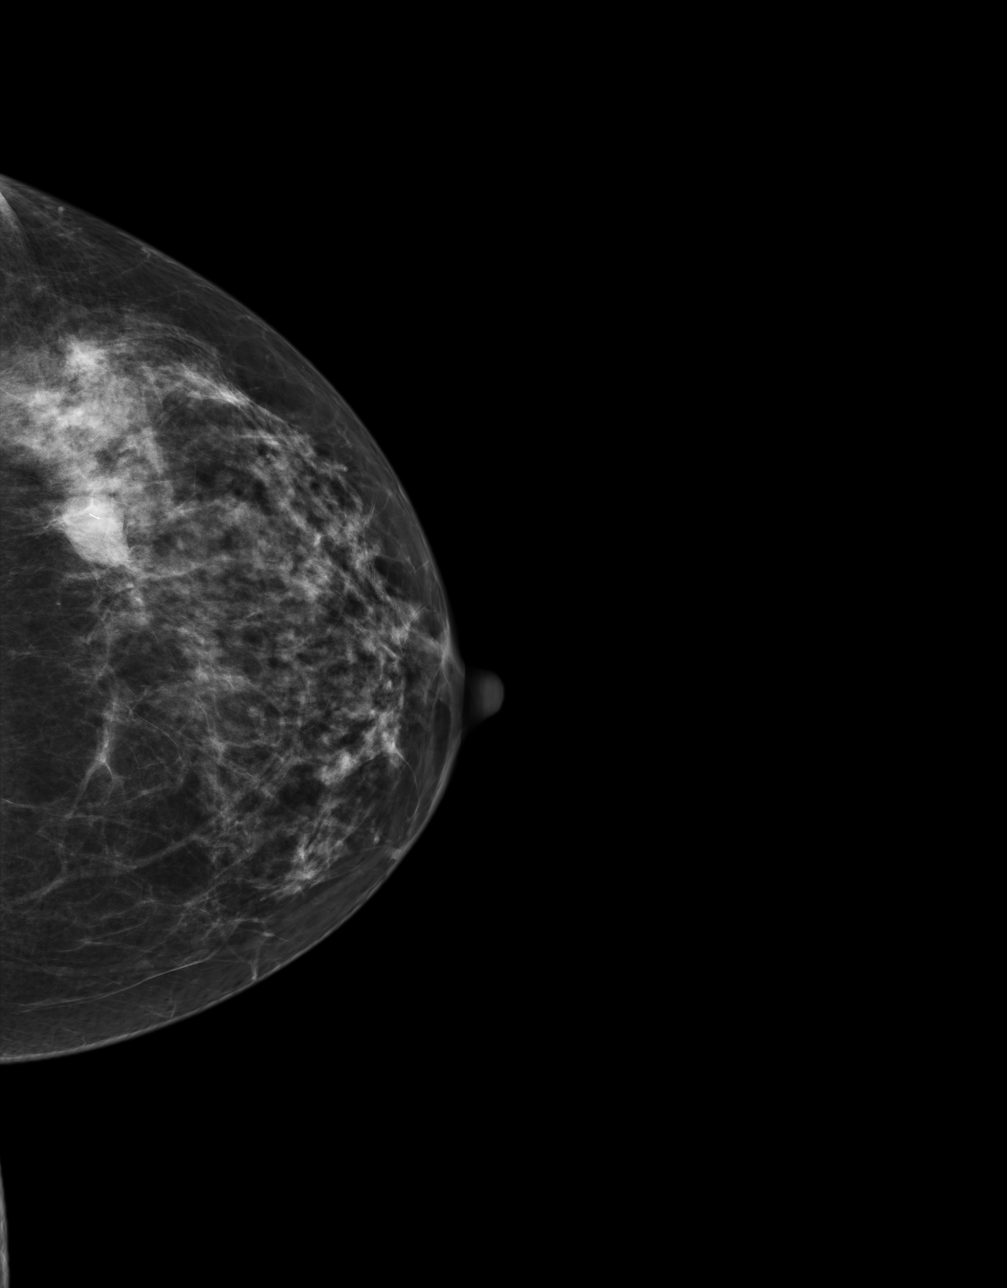
[im 3/4]
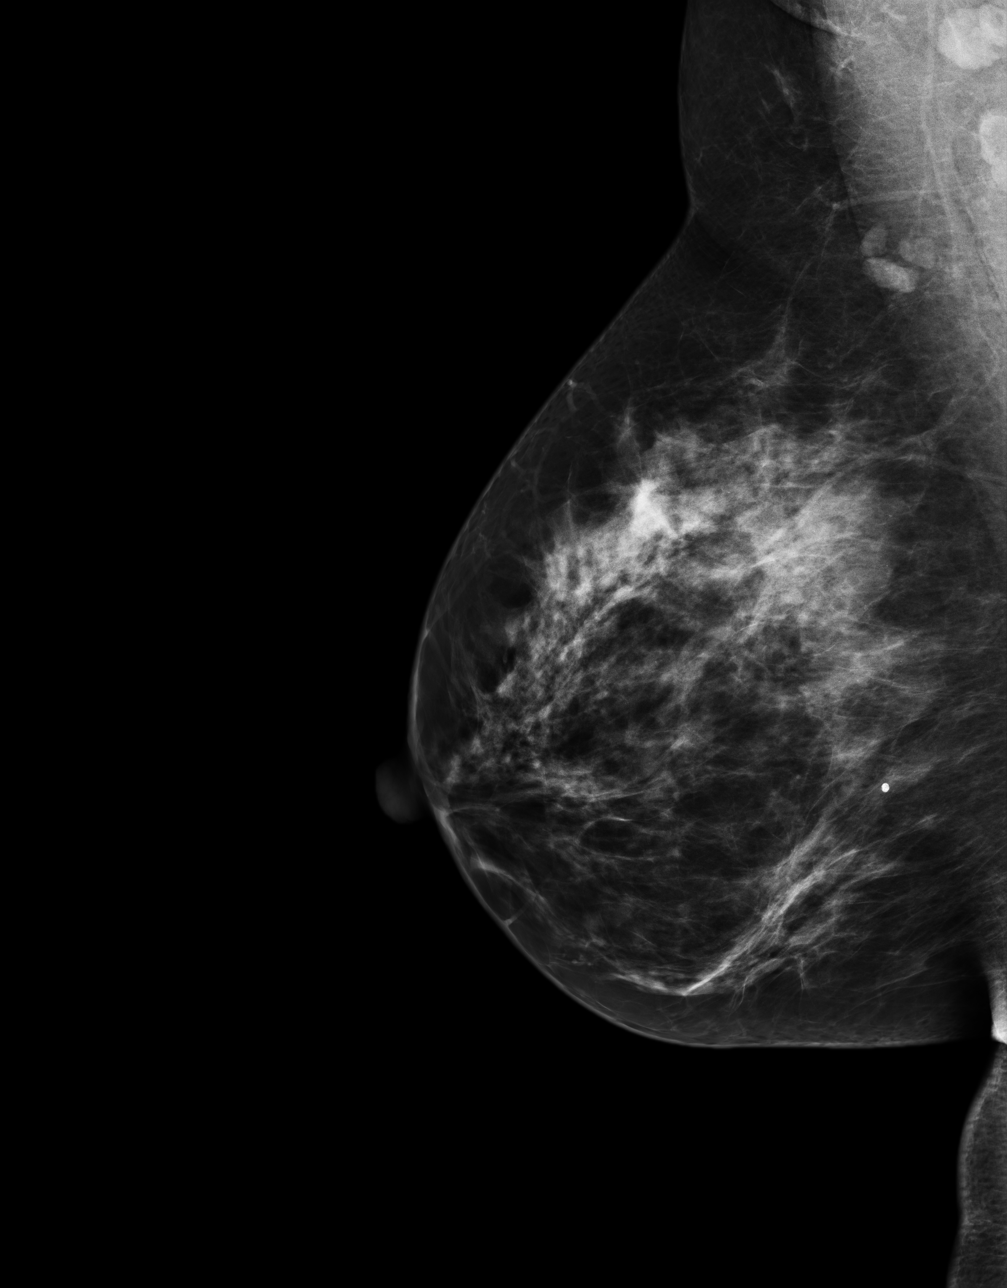
[im 4/4]
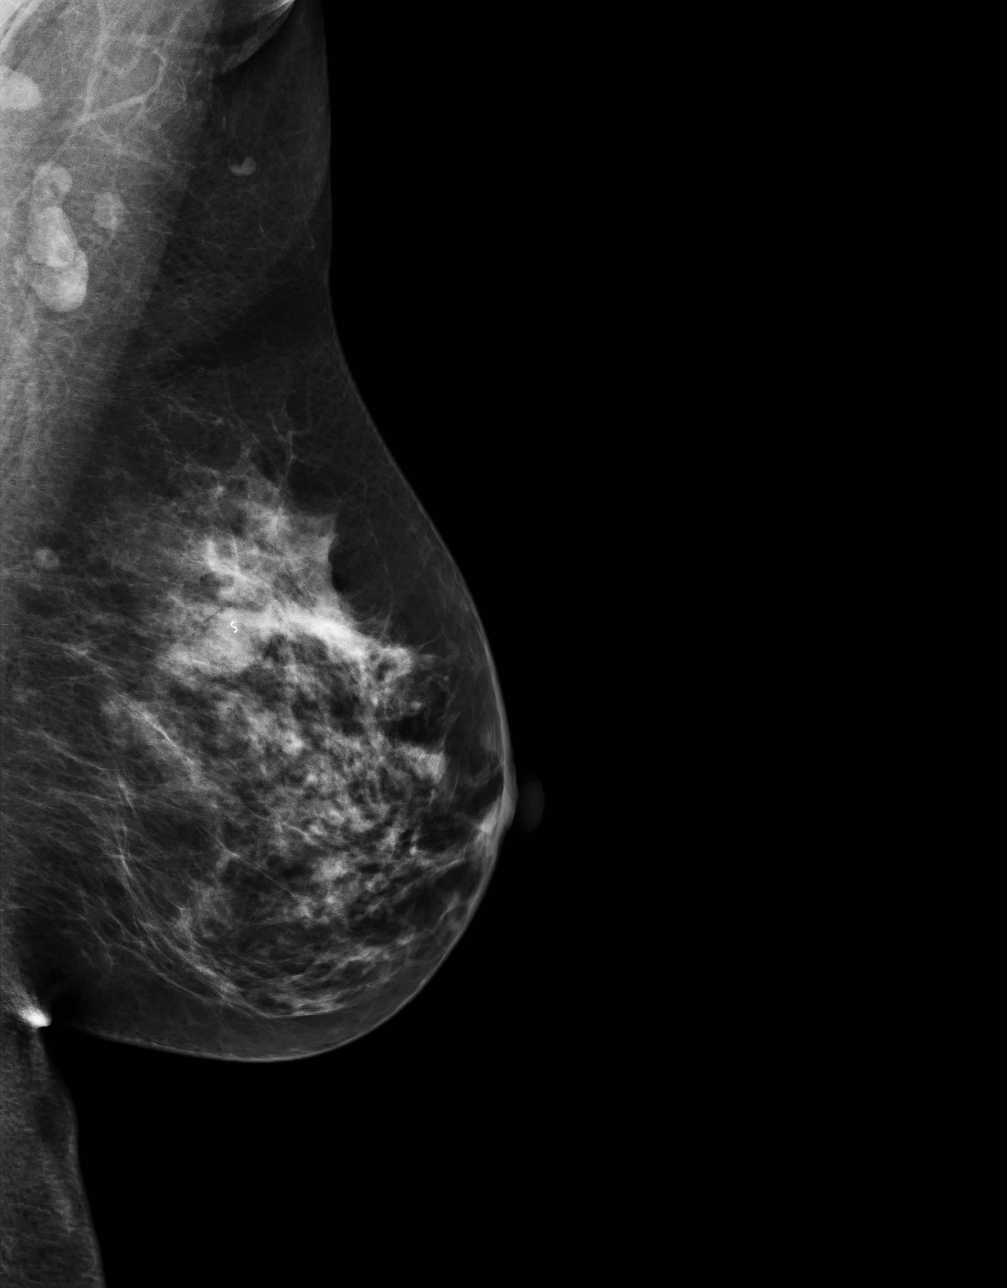

[4 of 4 positions shown; findings below may reference images not displayed]

ACR Breast Density Category c: The breast tissue is heterogeneously
dense, which may obscure small masses.
FINDINGS: There are no findings suspicious for malignancy. Images were
processed with CAD.
IMPRESSION: No mammographic evidence of malignancy. A result letter of this
screening mammogram will be mailed directly to the patient.

RECOMMENDATION:
Screening mammogram in one year. (Code:[0J])

BI-RADS CATEGORY  1: Negative.

## 2013-11-07 ENCOUNTER — Encounter: Payer: Self-pay | Admitting: General Surgery

## 2013-11-13 ENCOUNTER — Ambulatory Visit: Payer: 59 | Admitting: General Surgery

## 2013-11-15 ENCOUNTER — Ambulatory Visit: Payer: 59 | Admitting: General Surgery

## 2013-11-15 NOTE — Progress Notes (Signed)
This encounter was created in error - please disregard.

## 2013-11-15 NOTE — Progress Notes (Deleted)
Patient ID: Emma Cuevas, female   DOB: [birth year], 48 y.o.   MRN: [id number]  No chief complaint on file.   HPI Emma Cuevas is a 48 y.o. female who presents for a breast evaluation. The most recent mammogram was done at [facility] on 11/06/13. Patient does perform regular self breast checks and gets regular mammograms done.     HPI  Past Medical History  Diagnosis Date  . Family history of malignant neoplasm of breast 2013  . Breast mass, left 2013    benign  . Screening for obesity 2013  . Obesity, unspecified 2013  . Lump or mass in breast 2013    left breast  . Breast screening, unspecified 2013  . Benign neoplasm of breast 2013    left breast    Past Surgical History  Procedure Laterality Date  . Breast surgery Left 10/16/2011    left breast finesse biopsy  . Abdominal hysterectomy  2011    partial    Family History  Problem Relation Age of Onset  . Breast cancer Mother 21  . Breast cancer Maternal Aunt 57    Social History History  Substance Use Topics  . Smoking status: Never Smoker   . Smokeless tobacco: Not on file  . Alcohol Use: No    No Known Allergies  Current Outpatient Prescriptions  Medication Sig Dispense Refill  . Calcium Carbonate (CALCIUM 600 PO) Take 1 tablet by mouth as directed.       No current facility-administered medications for this visit.    Review of Systems Review of Systems  Constitutional: Negative.   Respiratory: Negative.   Cardiovascular: Negative.     There were no vitals taken for this visit.  Physical Exam Physical Exam  Data Reviewed ***  Assessment    ***    Plan    ***       Emma Cuevas 11/15/2013, 4:48 PM

## 2013-11-22 ENCOUNTER — Ambulatory Visit (INDEPENDENT_AMBULATORY_CARE_PROVIDER_SITE_OTHER): Payer: 59 | Admitting: General Surgery

## 2013-11-22 ENCOUNTER — Encounter: Payer: Self-pay | Admitting: General Surgery

## 2013-11-22 VITALS — BP 126/72 | HR 70 | Resp 12 | Ht 66.0 in | Wt 187.0 lb

## 2013-11-22 DIAGNOSIS — D249 Benign neoplasm of unspecified breast: Secondary | ICD-10-CM

## 2013-11-22 DIAGNOSIS — Z803 Family history of malignant neoplasm of breast: Secondary | ICD-10-CM

## 2013-11-22 DIAGNOSIS — D242 Benign neoplasm of left breast: Secondary | ICD-10-CM

## 2013-11-22 NOTE — Progress Notes (Signed)
Patient ID: Raynelle Jan, female   DOB: 05-Jan-1966, 48 y.o.   MRN: [id number]  Chief Complaint  Patient presents with  . Follow-up    mammogram    HPI AMBERLE LYTER is a 48 y.o. female who presents for a breast evaluation. The most recent mammogram was done on 11/07/13 Patient does perform regular self breast checks and gets regular mammograms done.   Patienty had a left breast mass 2013.Had a core biopsy showing a fibroadenoma.  Family history of breast cancer.  BRCA testing completed with OB/GYN was negative.  HPI  Past Medical History  Diagnosis Date  . Family history of malignant neoplasm of breast 2013  . Breast mass, left 2013    benign  . Screening for obesity 2013  . Obesity, unspecified 2013  . Lump or mass in breast 2013    left breast  . Breast screening, unspecified 2013  . Benign neoplasm of breast 2013    left breast    Past Surgical History  Procedure Laterality Date  . Breast surgery Left 10/16/2011    left breast finesse biopsy  . Abdominal hysterectomy  2011    partial    Family History  Problem Relation Age of Onset  . Breast cancer Mother 40  . Breast cancer Maternal Aunt 43  . Cancer Maternal Grandfather     prostate    Social History History  Substance Use Topics  . Smoking status: Never Smoker   . Smokeless tobacco: Never Used  . Alcohol Use: No    No Known Allergies  No current outpatient prescriptions on file.   No current facility-administered medications for this visit.    Review of Systems Review of Systems  Constitutional: Negative.   Respiratory: Negative.   Cardiovascular: Negative.     Blood pressure 126/72, pulse 70, resp. rate 12, height '5\' 6"'  (1.676 m), weight 187 lb (84.823 kg).  Physical Exam Physical Exam  Constitutional: She is oriented to person, place, and time. She appears well-developed and well-nourished.  Eyes: Conjunctivae are normal. No scleral icterus.  Neck: Neck supple.  Cardiovascular:  Normal rate, regular rhythm and normal heart sounds.   Pulmonary/Chest: Effort normal and breath sounds normal. Right breast exhibits no inverted nipple, no mass, no nipple discharge, no skin change and no tenderness. Left breast exhibits no inverted nipple, no mass, no nipple discharge, no skin change and no tenderness.  Lymphadenopathy:    She has no cervical adenopathy.    She has no axillary adenopathy.  Neurological: She is alert and oriented to person, place, and time.  Skin: Skin is warm and dry.    Data Reviewed Mammogram reviewed and stable.  Assessment    Stable physical exam. Benign fibroadenoma left breast     Plan    Follow up in one year with bilateral screening mammogram and office visit. Discussed scheduling a screening colonoscopy within the next year.       Nishka Heide G 11/23/2013, 7:33 AM

## 2013-11-22 NOTE — Patient Instructions (Addendum)
Follow up in one year with bilateral screening mammogram and office visit. Continue self breast exams. Call office for any new breast issues or concerns. Discussed scheduling a colonoscopy within the next year.

## 2013-11-23 ENCOUNTER — Encounter: Payer: Self-pay | Admitting: General Surgery

## 2014-01-11 ENCOUNTER — Telehealth: Payer: Self-pay | Admitting: General Surgery

## 2014-01-11 NOTE — Telephone Encounter (Signed)
01-11-14 I L/M @ 1:57PM ON PTS CELL,ASKING HER TO CALL BACK REGAURDING HER PMT.SHE MAILED IN HER [employer] CARD # TO PAY THE [id number].WHEN MICHELE RAN THE INFO THROUGH THE CARD ONLY ACCEPTED THE AMT $123.79.(ALL THAT WAS LEFT ON THE CARD.SHE VOIDED THE CREDIT CARD,NOT SURE IF PT WANTED TO PAY WITH ANOTHER CARD,RUN THE CARD INFO WE HAVE AGAIN./MTH

## 2014-03-26 ENCOUNTER — Encounter: Payer: Self-pay | Admitting: General Surgery

## 2014-10-03 ENCOUNTER — Other Ambulatory Visit: Payer: Self-pay

## 2014-10-03 DIAGNOSIS — Z1231 Encounter for screening mammogram for malignant neoplasm of breast: Secondary | ICD-10-CM

## 2014-11-08 ENCOUNTER — Ambulatory Visit
Admission: RE | Admit: 2014-11-08 | Discharge: 2014-11-08 | Disposition: A | Discharge status: Home / Self Care | Payer: 59 | Source: Ambulatory Visit | Attending: General Surgery | Admitting: General Surgery

## 2014-11-08 DIAGNOSIS — Z1231 Encounter for screening mammogram for malignant neoplasm of breast: Secondary | ICD-10-CM | POA: Diagnosis not present

## 2014-11-08 IMAGING — MG MM DIGITAL SCREENING BILAT W/ CAD
6 series · 6 of 6 positions shown · non-contrast
Comparison: Previous exam(s).

CLINICAL DATA: Screening.

EXAM:
DIGITAL SCREENING BILATERAL MAMMOGRAM WITH CAD

[L CC]
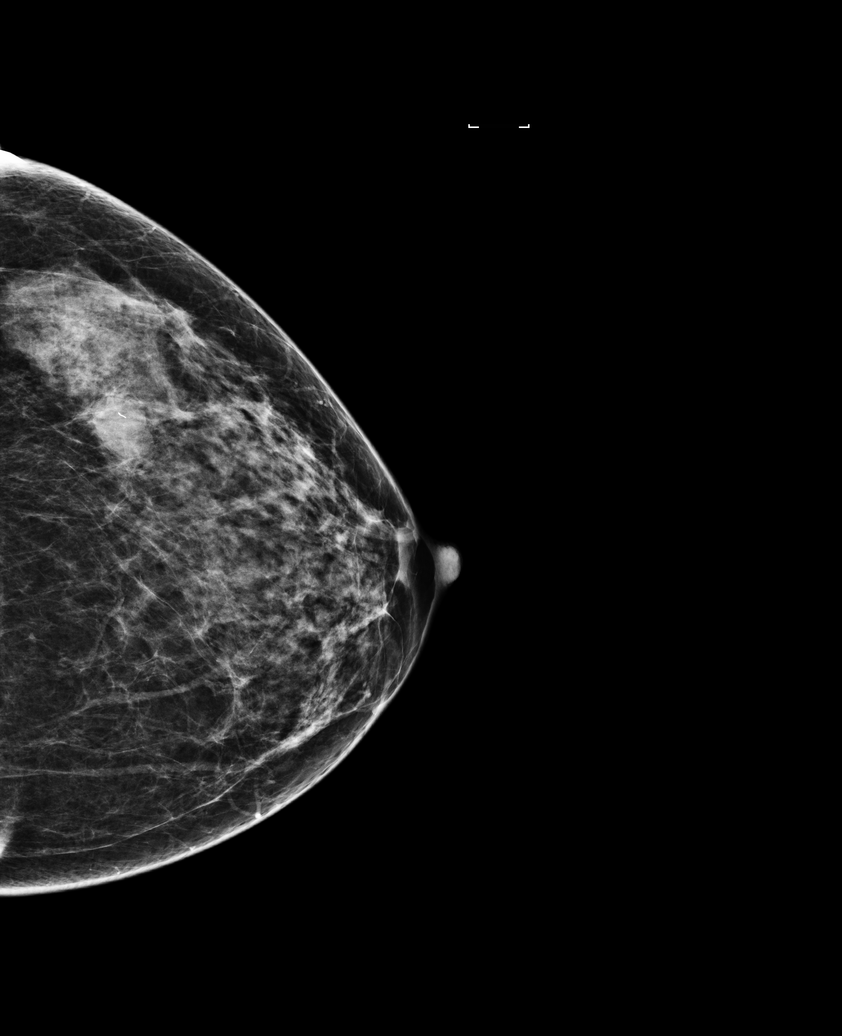

[R XCCL]
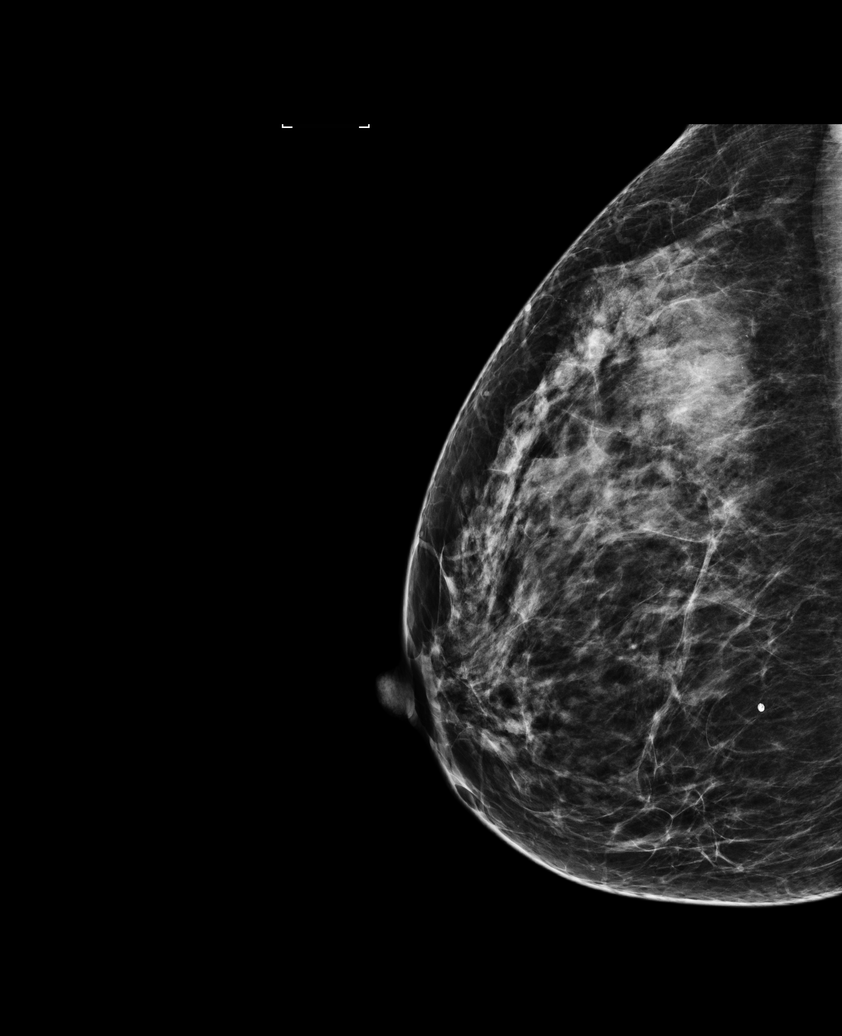

[L MLO]
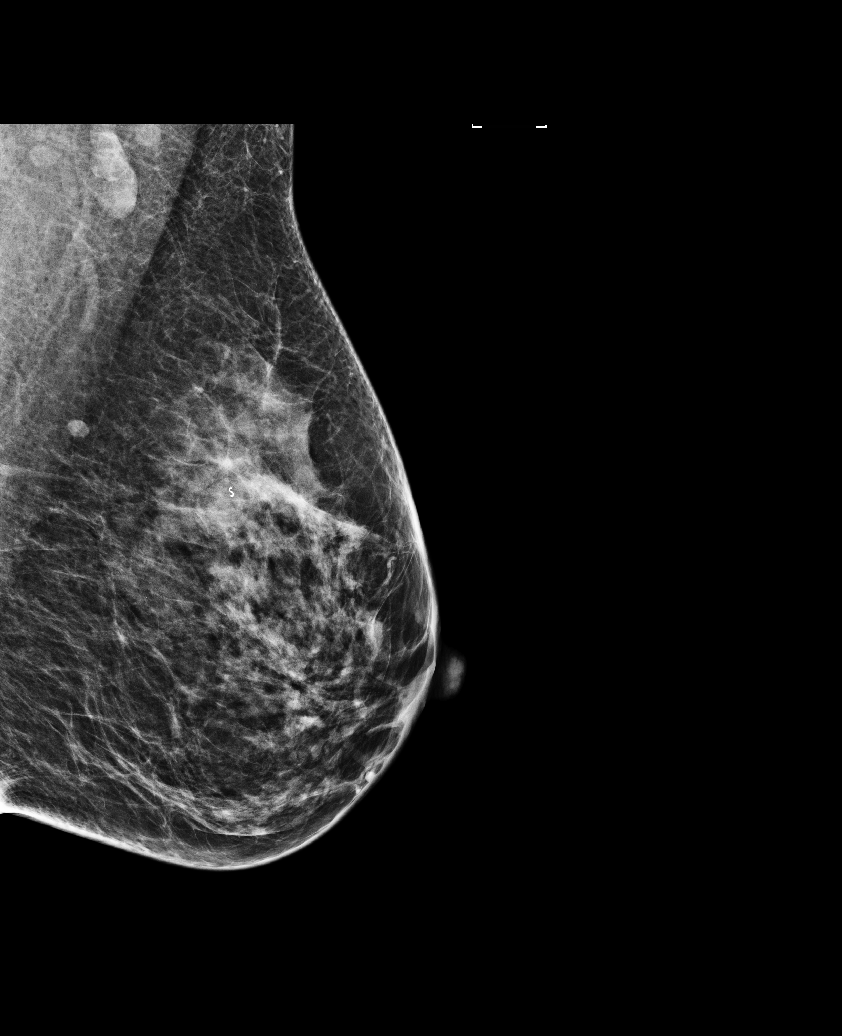

[R MLO]
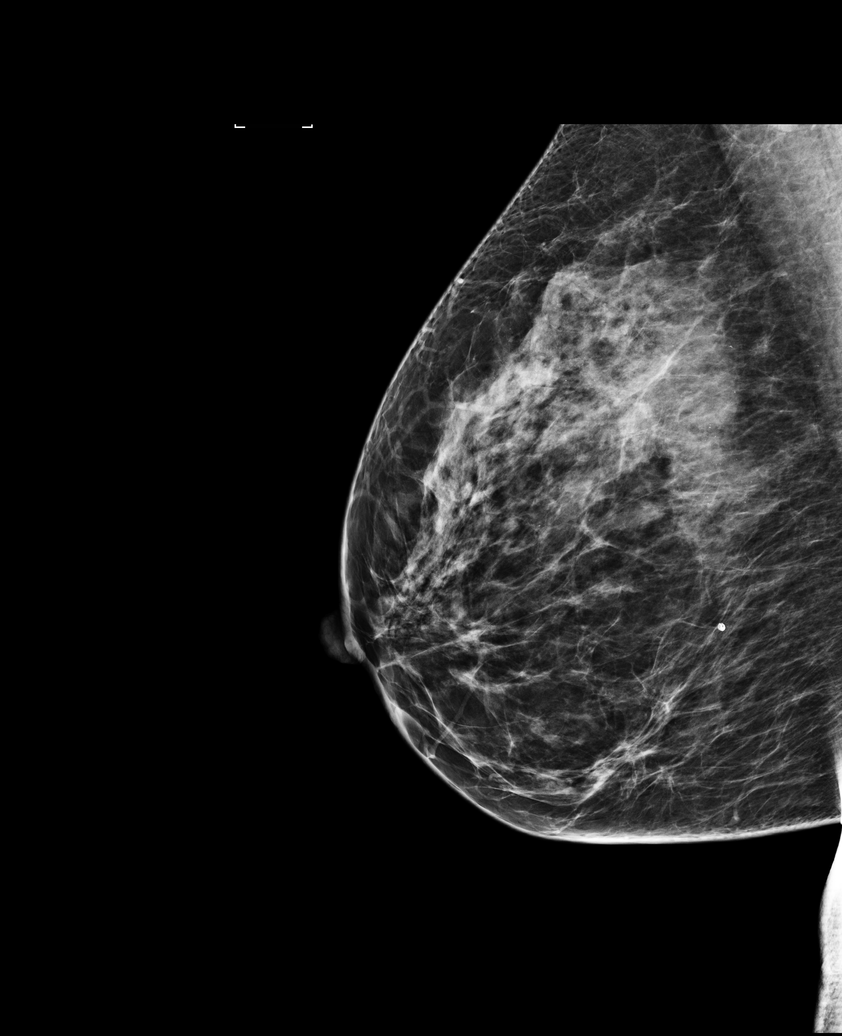

[R CC]
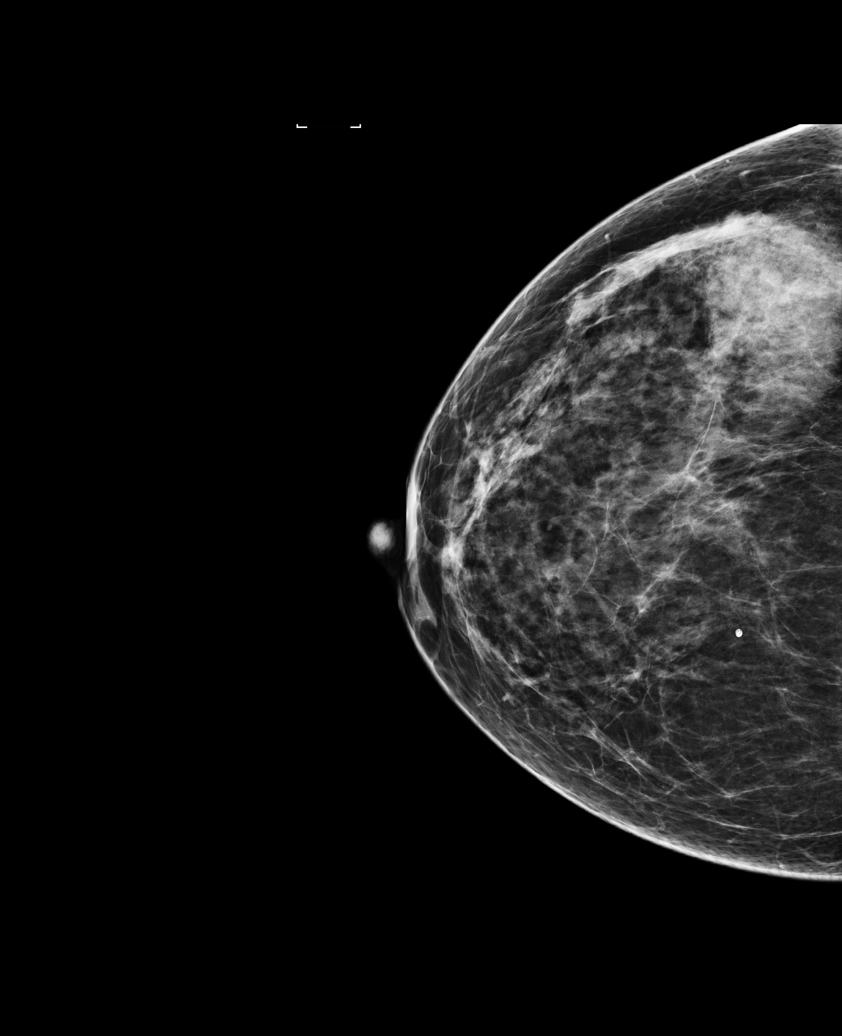

[L XCCL]
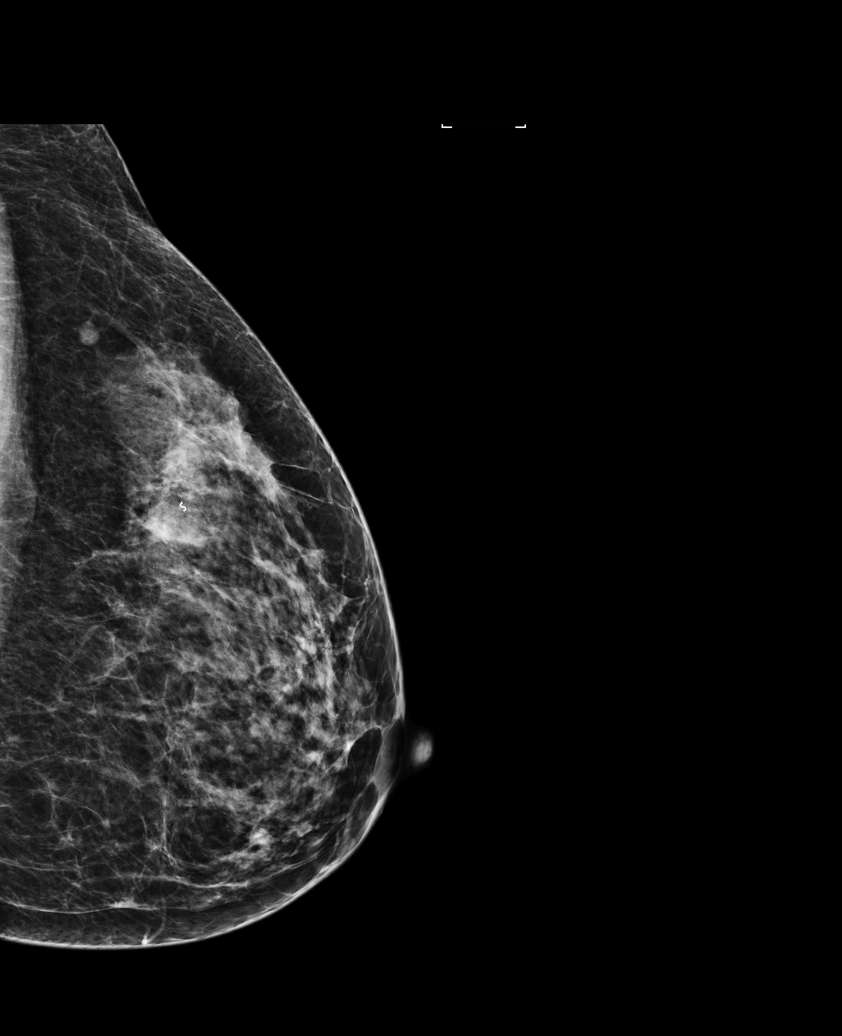

[6 of 6 positions shown; findings below may reference images not displayed]

ACR Breast Density Category c: The breast tissue is heterogeneously
dense, which may obscure small masses.
FINDINGS: There are no findings suspicious for malignancy. Images were
processed with CAD.
IMPRESSION: No mammographic evidence of malignancy. A result letter of this
screening mammogram will be mailed directly to the patient.

RECOMMENDATION:
Screening mammogram in one year. (Code:[0J])

BI-RADS CATEGORY  1: Negative.

## 2014-11-15 ENCOUNTER — Ambulatory Visit (INDEPENDENT_AMBULATORY_CARE_PROVIDER_SITE_OTHER): Payer: 59 | Admitting: General Surgery

## 2014-11-15 ENCOUNTER — Encounter: Payer: Self-pay | Admitting: General Surgery

## 2014-11-15 VITALS — BP 102/62 | HR 58 | Resp 12 | Ht 66.0 in | Wt 164.0 lb

## 2014-11-15 DIAGNOSIS — Z803 Family history of malignant neoplasm of breast: Secondary | ICD-10-CM

## 2014-11-15 DIAGNOSIS — D242 Benign neoplasm of left breast: Secondary | ICD-10-CM | POA: Diagnosis not present

## 2014-11-15 MED ORDER — POLYETHYLENE GLYCOL 3350 17 GM/SCOOP PO POWD: ORAL | Status: DC

## 2014-11-15 NOTE — Patient Instructions (Addendum)
The patient has been asked to return to the office in one year with a bilateral screening mammogram. Continue self breast exams. Call office for any new breast issues or concerns.ns.Colonoscopy A colonoscopy is an exam to look at the entire large intestine (colon). This exam can help find problems such as tumors, polyps, inflammation, and areas of bleeding. The exam takes about 1 hour.  LET [facility] CARE PROVIDER KNOW ABOUT:   Any allergies you have.  All medicines you are taking, including vitamins, herbs, eye drops, creams, and over-the-counter medicines.  Previous problems you or members of your family have had with the use of anesthetics.  Any blood disorders you have.  Previous surgeries you have had.  Medical conditions you have. RISKS AND COMPLICATIONS  Generally, this is a safe procedure. However, as with any procedure, complications can occur. Possible complications include:  Bleeding.  Tearing or rupture of the colon wall.  Reaction to medicines given during the exam.  Infection (rare). BEFORE THE PROCEDURE   Ask your health care provider about changing or stopping your regular medicines.  You may be prescribed an oral bowel prep. This involves drinking a large amount of medicated liquid, starting the day before your procedure. The liquid will cause you to have multiple loose stools until your stool is almost clear or light green. This cleans out your colon in preparation for the procedure.  Do not eat or drink anything else once you have started the bowel prep, unless your health care provider tells you it is safe to do so.  Arrange for someone to drive you home after the procedure. PROCEDURE   You will be given medicine to help you relax (sedative).  You will lie on your side with your knees bent.  A long, flexible tube with a light and camera on the end (colonoscope) will be inserted through the rectum and into the colon. The camera sends video back to a  computer screen as it moves through the colon. The colonoscope also releases carbon dioxide gas to inflate the colon. This helps your health care provider see the area better.  During the exam, your health care provider may take a small tissue sample (biopsy) to be examined under a microscope if any abnormalities are found.  The exam is finished when the entire colon has been viewed. AFTER THE PROCEDURE   Do not drive for 24 hours after the exam.  You may have a small amount of blood in your stool.  You may pass moderate amounts of gas and have mild abdominal cramping or bloating. This is caused by the gas used to inflate your colon during the exam.  Ask when your test results will be ready and how you will get your results. Make sure you get your test results. Document Released: 05/08/2000 Document Revised: 03/01/2013 Document Reviewed: 01/16/2013 [facility] Patient Information 2015 [place], [place]. This information is not intended to replace advice given to you by your health care provider. Make sure you discuss any questions you have with your health care provider.  Patient has been scheduled for a colonoscopy on 12-05-14 at [facility].

## 2014-11-15 NOTE — Progress Notes (Signed)
Patient ID: Emma Cuevas, female   DOB: 03/05/1966, 49 y.o.   MRN: [id number]  Chief Complaint  Patient presents with  . Follow-up    mammogram    HPI Emma Cuevas is a 49 y.o. female.  who presents for a breast evaluation. The most recent mammogram was done on 11-07-14.  Patient does perform regular self breast checks and gets regular mammograms done.  No new breast issues.  HPI  Past Medical History  Diagnosis Date  . Family history of malignant neoplasm of breast 2013  . Breast mass, left 2013    benign  . Screening for obesity 2013  . Obesity, unspecified 2013  . Lump or mass in breast 2013    left breast  . Breast screening, unspecified 2013  . Benign neoplasm of breast 2013    left breast  . BRCA negative 2015    Past Surgical History  Procedure Laterality Date  . Breast surgery Left 10/16/2011    left breast finesse biopsy  . Abdominal hysterectomy  2011    partial  . Breast biopsy Left 2013    Negative    Family History  Problem Relation Age of Onset  . Breast cancer Mother 64  . Breast cancer Maternal Aunt 45  . Cancer Maternal Grandfather     prostate    Social History History  Substance Use Topics  . Smoking status: Never Smoker   . Smokeless tobacco: Never Used  . Alcohol Use: No    No Known Allergies  Current Outpatient Prescriptions  Medication Sig Dispense Refill  . Nutritional Supplements (ESTROVEN MAXIMUM STRENGTH PO) Take by mouth daily.    . Probiotic Product (PROBIOTIC ADVANCED PO) Take by mouth daily.    . Vitamin D, Cholecalciferol, 1000 UNITS CAPS Take by mouth daily.    . polyethylene glycol powder (GLYCOLAX/MIRALAX) powder 255 grams one bottle for colonoscopy prep 255 g 0   No current facility-administered medications for this visit.    Review of Systems Review of Systems  Constitutional: Negative.   Respiratory: Negative.   Cardiovascular: Negative.     Blood pressure 102/62, pulse 58, resp. rate 12, height 5' 6"  (1.676 m), weight 164 lb (74.39 kg).  Physical Exam Physical Exam  Constitutional: She is oriented to person, place, and time. She appears well-developed and well-nourished.  Eyes: Conjunctivae are normal. No scleral icterus.  Neck: Neck supple.  Cardiovascular: Normal rate, regular rhythm and normal heart sounds.   Pulmonary/Chest: Effort normal and breath sounds normal. Right breast exhibits no inverted nipple, no mass, no nipple discharge, no skin change and no tenderness. Left breast exhibits no inverted nipple, no mass, no nipple discharge, no skin change and no tenderness. Breasts are symmetrical.  Abdominal: Soft. Bowel sounds are normal. There is no hepatomegaly. There is no tenderness.  Lymphadenopathy:    She has no cervical adenopathy.    She has no axillary adenopathy.  Neurological: She is alert and oriented to person, place, and time.  Skin: Skin is warm and dry.    Data Reviewed Mammogram, stable  Assessment    Prior history of fibroadenoma of left breast. Exam and mammogram stable. BRCA negative.  Family history of breast cancer.     Plan    The patient has been asked to return to the office in one year with a bilateral screening mammogram. Continue self breast exams. Call office for any new breast issues or concerns.   Patient is due for screening colonoscopy.    Colonoscopy with possible biopsy/polypectomy prn: Information regarding the procedure, including its potential risks and complications (including but not limited to perforation of the bowel, which may require emergency surgery to repair, and bleeding) was verbally given to the patient. Educational information regarding lower instestinal endoscopy was given to the patient. Written instructions for how to complete the bowel prep using Miralax were provided. The importance of drinking ample fluids to avoid dehydration as a result of the prep emphasized. Patient agreeable.   Patient has been scheduled for a  colonoscopy on 12-05-14 at [facility].   PCP:  No Pcp Per Patient  Tremain Rucinski G 11/15/2014, 6:24 PM   

## 2014-11-20 ENCOUNTER — Encounter: Payer: Self-pay | Admitting: General Surgery

## 2014-11-28 ENCOUNTER — Other Ambulatory Visit: Payer: Self-pay | Admitting: General Surgery

## 2014-11-28 DIAGNOSIS — Z1211 Encounter for screening for malignant neoplasm of colon: Secondary | ICD-10-CM

## 2014-12-05 ENCOUNTER — Ambulatory Visit
Admission: RE | Admit: 2014-12-05 | Discharge: 2014-12-05 | Disposition: A | Discharge status: Home / Self Care | Payer: 59 | Source: Ambulatory Visit | Attending: General Surgery | Admitting: General Surgery

## 2014-12-05 ENCOUNTER — Ambulatory Visit: Payer: 59 | Admitting: Anesthesiology

## 2014-12-05 ENCOUNTER — Encounter: Payer: Self-pay | Admitting: *Deleted

## 2014-12-05 ENCOUNTER — Encounter
Admission: RE | Disposition: A | Discharge status: Home / Self Care | Payer: Self-pay | Source: Ambulatory Visit | Attending: General Surgery

## 2014-12-05 DIAGNOSIS — Z803 Family history of malignant neoplasm of breast: Secondary | ICD-10-CM | POA: Diagnosis not present

## 2014-12-05 DIAGNOSIS — Z1211 Encounter for screening for malignant neoplasm of colon: Secondary | ICD-10-CM

## 2014-12-05 DIAGNOSIS — Z8042 Family history of malignant neoplasm of prostate: Secondary | ICD-10-CM | POA: Insufficient documentation

## 2014-12-05 DIAGNOSIS — K644 Residual hemorrhoidal skin tags: Secondary | ICD-10-CM

## 2014-12-05 DIAGNOSIS — Z90710 Acquired absence of both cervix and uterus: Secondary | ICD-10-CM | POA: Insufficient documentation

## 2014-12-05 DIAGNOSIS — Z79899 Other long term (current) drug therapy: Secondary | ICD-10-CM | POA: Diagnosis not present

## 2014-12-05 DIAGNOSIS — E669 Obesity, unspecified: Secondary | ICD-10-CM | POA: Diagnosis not present

## 2014-12-05 HISTORY — PX: COLONOSCOPY WITH PROPOFOL: SHX5780

## 2014-12-05 SURGERY — COLONOSCOPY WITH PROPOFOL
Anesthesia: General

## 2014-12-05 MED ORDER — PROPOFOL 10 MG/ML IV BOLUS
INTRAVENOUS | Status: DC | PRN
  Administered 2014-12-05 (×2): 50 mg via INTRAVENOUS

## 2014-12-05 MED ORDER — LACTATED RINGERS IV SOLN
INTRAVENOUS | Status: DC
  Administered 2014-12-05: 10:00:00 via INTRAVENOUS
  Administered 2014-12-05: 1000 mL via INTRAVENOUS

## 2014-12-05 MED ORDER — LIDOCAINE HCL (CARDIAC) 20 MG/ML IV SOLN
INTRAVENOUS | Status: DC | PRN
  Administered 2014-12-05: 20 mg via INTRAVENOUS

## 2014-12-05 MED ORDER — PROPOFOL INFUSION 10 MG/ML OPTIME
INTRAVENOUS | Status: DC | PRN
  Administered 2014-12-05: 150 ug/kg/min via INTRAVENOUS

## 2014-12-05 NOTE — Interval H&P Note (Signed)
History and Physical Interval Note:  12/05/2014 9:38 AM  Emma Cuevas  has presented today for surgery, with the diagnosis of SCREENING  The various methods of treatment have been discussed with the patient and family. After consideration of risks, benefits and other options for treatment, the patient has consented to  Procedure(s): COLONOSCOPY WITH PROPOFOL (N/A) as a surgical intervention .  The patient's history has been reviewed, patient examined, no change in status, stable for surgery.  I have reviewed the patient's chart and labs.  Questions were answered to the patient's satisfaction.     SANKAR,SEEPLAPUTHUR G

## 2014-12-05 NOTE — Op Note (Signed)
[facility]astroenterology Patient Name: Emma Cuevas Procedure Date: 12/05/2014 9:39 AM MRN: [id number] Account #: [id number] Date of Birth: 1966-04-30 Admit Type: Outpatient Age: 49 Room: [facility] ENDO ROOM 1 Gender: Female Note Status: Finalized Procedure:         Colonoscopy Indications:       Screening for colorectal malignant neoplasm Providers:         Orlie Pollen, MD Medicines:         General Anesthesia Complications:     No immediate complications. Procedure:         Pre-Anesthesia Assessment:                    - General anesthesia under the supervision of an                     anesthesiologist was determined to be medically necessary                     for this procedure based on review of the patient's                     medical history, medications, and prior anesthesia history.                    After obtaining informed consent, the colonoscope was                     passed under direct vision. Throughout the procedure, the                     patient's blood pressure, pulse, and oxygen saturations                     were monitored continuously. The Colonoscope was                     introduced through the anus and advanced to the the cecum,                     identified by the ileocecal valve. The colonoscopy was                     performed without difficulty. The quality of the bowel                     preparation was good. Findings:      The perianal exam findings include non-thrombosed external hemorrhoids.      The entire examined colon appeared normal. Impression:        - Non-thrombosed external hemorrhoids found on perianal                     exam.                    - The entire examined colon is normal.                    - No specimens collected. Recommendation:    - Return to endoscopist as previously scheduled. Procedure Code(s): --- Professional ---                    [phone], Colonoscopy, flexible; diagnostic,  including  collection of specimen(s) by brushing or washing, when                     performed (separate procedure) Diagnosis Code(s): --- Professional ---                    Z12.11, Encounter for screening for malignant neoplasm of                     colon                    K64.4, Residual hemorrhoidal skin tags CPT copyright 2014 American [employer] Association. All rights reserved. The codes documented in this report are preliminary and upon coder review may  be revised to meet current compliance requirements. Orlie Pollen, MD 12/05/2014 10:11:17 AM This report has been signed electronically. Number of Addenda: 0 Note Initiated On: 12/05/2014 9:39 AM Scope Withdrawal Time: 0 hours 2 minutes 8 seconds  Total Procedure Duration: 0 hours 22 minutes 58 seconds       [facility]

## 2014-12-05 NOTE — Anesthesia Preprocedure Evaluation (Signed)
Anesthesia Evaluation  Patient identified by MRN, date of birth, ID band Patient awake    Reviewed: Allergy & Precautions, H&P , NPO status , Patient's Chart, lab work & pertinent test results, reviewed documented beta blocker date and time   Airway Mallampati: II  TM Distance: >3 FB Neck ROM: full    Dental no notable dental hx. (+) Teeth Intact   Pulmonary neg pulmonary ROS,  breath sounds clear to auscultation  Pulmonary exam normal       Cardiovascular - Past MI negative cardio ROS Normal cardiovascular examRhythm:regular Rate:Normal     Neuro/Psych negative neurological ROS  negative psych ROS   GI/Hepatic negative GI ROS, Neg liver ROS,   Endo/Other  negative endocrine ROS  Renal/GU negative Renal ROS  negative genitourinary   Musculoskeletal   Abdominal   Peds  Hematology negative hematology ROS (+)   Anesthesia Other Findings Past Medical History:   Family history of malignant neoplasm of breast  2013         Breast mass, left                               2013           Comment:benign   Screening for obesity                           2013         Obesity, unspecified                            2013         Lump or mass in breast                          2013           Comment:left breast   Breast screening, unspecified                   2013         Benign neoplasm of breast                       2013           Comment:left breast   BRCA negative                                   2015         Reproductive/Obstetrics negative OB ROS                             Anesthesia Physical Anesthesia Plan  ASA: II  Anesthesia Plan: General   Post-op Pain Management:    Induction:   Airway Management Planned:   Additional Equipment:   Intra-op Plan:   Post-operative Plan:   Informed Consent: I have reviewed the patients History and Physical, chart, labs and discussed the  procedure including the risks, benefits and alternatives for the proposed anesthesia with the patient or authorized representative who has indicated his/her understanding and acceptance.   Dental Advisory Given  Plan Discussed with: Anesthesiologist, CRNA and Surgeon  Anesthesia Plan Comments:         Anesthesia Quick Evaluation

## 2014-12-05 NOTE — Transfer of Care (Signed)
Immediate Anesthesia Transfer of Care Note  Patient: Emma Cuevas [facility]  Procedure(s) Performed: Procedure(s): COLONOSCOPY WITH PROPOFOL (N/A)  Patient Location: PACU and Endoscopy Unit  Anesthesia Type:General  Level of Consciousness: awake, alert  and oriented  Airway & Oxygen Therapy: Patient Spontanous Breathing and Patient connected to nasal cannula oxygen  Post-op Assessment: Report given to RN and Post -op Vital signs reviewed and stable  Post vital signs: Reviewed and stable  Last Vitals:  Filed Vitals:   12/05/14 0826  BP: 116/60  Pulse: 74  Temp: 36.8 C  Resp: 22    Complications: No apparent anesthesia complications

## 2014-12-05 NOTE — Anesthesia Postprocedure Evaluation (Signed)
  Anesthesia Post-op Note  Patient: Emma Cuevas  Procedure(s) Performed: Procedure(s): COLONOSCOPY WITH PROPOFOL (N/A)  Anesthesia type:General  Patient location: PACU  Post pain: Pain level controlled  Post assessment: Post-op Vital signs reviewed, Patient's Cardiovascular Status Stable, Respiratory Function Stable, Patent Airway and No signs of Nausea or vomiting  Post vital signs: Reviewed and stable  Last Vitals:  Filed Vitals:   12/05/14 1010  BP: 100/52  Pulse: 70  Temp: 36.3 C  Resp: 22    Level of consciousness: awake, alert  and patient cooperative  Complications: No apparent anesthesia complications

## 2014-12-05 NOTE — H&P (View-Only) (Signed)
Patient ID: Emma Cuevas, female   DOB: [birth year], 49 y.o.   MRN: [id number]  Chief Complaint  Patient presents with  . Follow-up    mammogram    HPI Emma Cuevas is a 49 y.o. female.  who presents for a breast evaluation. The most recent mammogram was done on 11-07-14.  Patient does perform regular self breast checks and gets regular mammograms done.  No new breast issues.  HPI  Past Medical History  Diagnosis Date  . Family history of malignant neoplasm of breast 2013  . Breast mass, left 2013    benign  . Screening for obesity 2013  . Obesity, unspecified 2013  . Lump or mass in breast 2013    left breast  . Breast screening, unspecified 2013  . Benign neoplasm of breast 2013    left breast  . BRCA negative 2015    Past Surgical History  Procedure Laterality Date  . Breast surgery Left 10/16/2011    left breast finesse biopsy  . Abdominal hysterectomy  2011    partial  . Breast biopsy Left 2013    Negative    Family History  Problem Relation Age of Onset  . Breast cancer Mother 45  . Breast cancer Maternal Aunt 60  . Cancer Maternal Grandfather     prostate    Social History History  Substance Use Topics  . Smoking status: Never Smoker   . Smokeless tobacco: Never Used  . Alcohol Use: No    No Known Allergies  Current Outpatient Prescriptions  Medication Sig Dispense Refill  . Nutritional Supplements (ESTROVEN MAXIMUM STRENGTH PO) Take by mouth daily.    . Probiotic Product (PROBIOTIC ADVANCED PO) Take by mouth daily.    . Vitamin D, Cholecalciferol, 1000 UNITS CAPS Take by mouth daily.    . polyethylene glycol powder (GLYCOLAX/MIRALAX) powder 255 grams one bottle for colonoscopy prep 255 g 0   No current facility-administered medications for this visit.    Review of Systems Review of Systems  Constitutional: Negative.   Respiratory: Negative.   Cardiovascular: Negative.     Blood pressure 102/62, pulse 58, resp. rate 12, height _0   (1.676 m), weight 164 lb (74.39 kg).  Physical Exam Physical Exam  Constitutional: She is oriented to person, place, and time. She appears well-developed and well-nourished.  Eyes: Conjunctivae are normal. No scleral icterus.  Neck: Neck supple.  Cardiovascular: Normal rate, regular rhythm and normal heart sounds.   Pulmonary/Chest: Effort normal and breath sounds normal. Right breast exhibits no inverted nipple, no mass, no nipple discharge, no skin change and no tenderness. Left breast exhibits no inverted nipple, no mass, no nipple discharge, no skin change and no tenderness. Breasts are symmetrical.  Abdominal: Soft. Bowel sounds are normal. There is no hepatomegaly. There is no tenderness.  Lymphadenopathy:    She has no cervical adenopathy.    She has no axillary adenopathy.  Neurological: She is alert and oriented to person, place, and time.  Skin: Skin is warm and dry.    Data Reviewed Mammogram, stable  Assessment    Prior history of fibroadenoma of left breast. Exam and mammogram stable. BRCA negative.  Family history of breast cancer.     Plan    The patient has been asked to return to the office in one year with a bilateral screening mammogram. Continue self breast exams. Call office for any new breast issues or concerns.   Patient is due for screening colonoscopy.  Colonoscopy with possible biopsy/polypectomy prn: Information regarding the procedure, including its potential risks and complications (including but not limited to perforation of the bowel, which may require emergency surgery to repair, and bleeding) was verbally given to the patient. Educational information regarding lower instestinal endoscopy was given to the patient. Written instructions for how to complete the bowel prep using Miralax were provided. The importance of drinking ample fluids to avoid dehydration as a result of the prep emphasized. Patient agreeable.   Patient has been scheduled for a  colonoscopy on 12-05-14 at [facility].   PCP:  No Pcp Per Patient  Christene Lye 11/15/2014, 6:24 PM

## 2015-08-27 ENCOUNTER — Other Ambulatory Visit: Payer: Self-pay | Admitting: *Deleted

## 2015-08-27 DIAGNOSIS — Z1231 Encounter for screening mammogram for malignant neoplasm of breast: Secondary | ICD-10-CM

## 2015-08-29 ENCOUNTER — Encounter: Payer: Self-pay | Admitting: *Deleted

## 2015-11-11 ENCOUNTER — Other Ambulatory Visit: Payer: Self-pay | Admitting: General Surgery

## 2015-11-11 ENCOUNTER — Ambulatory Visit
Admission: RE | Admit: 2015-11-11 | Discharge: 2015-11-11 | Disposition: A | Discharge status: Home / Self Care | Payer: 59 | Source: Ambulatory Visit | Attending: General Surgery | Admitting: General Surgery

## 2015-11-11 DIAGNOSIS — Z1231 Encounter for screening mammogram for malignant neoplasm of breast: Secondary | ICD-10-CM

## 2015-11-11 IMAGING — MG MM DIGITAL SCREENING BILAT W/ TOMO W/ CAD
8 of 12 series · 8 of 28 positions shown · non-contrast
Comparison: Previous exam(s).

CLINICAL DATA: Screening.

EXAM:
2D DIGITAL SCREENING BILATERAL MAMMOGRAM WITH CAD AND ADJUNCT TOMO

[R CC]
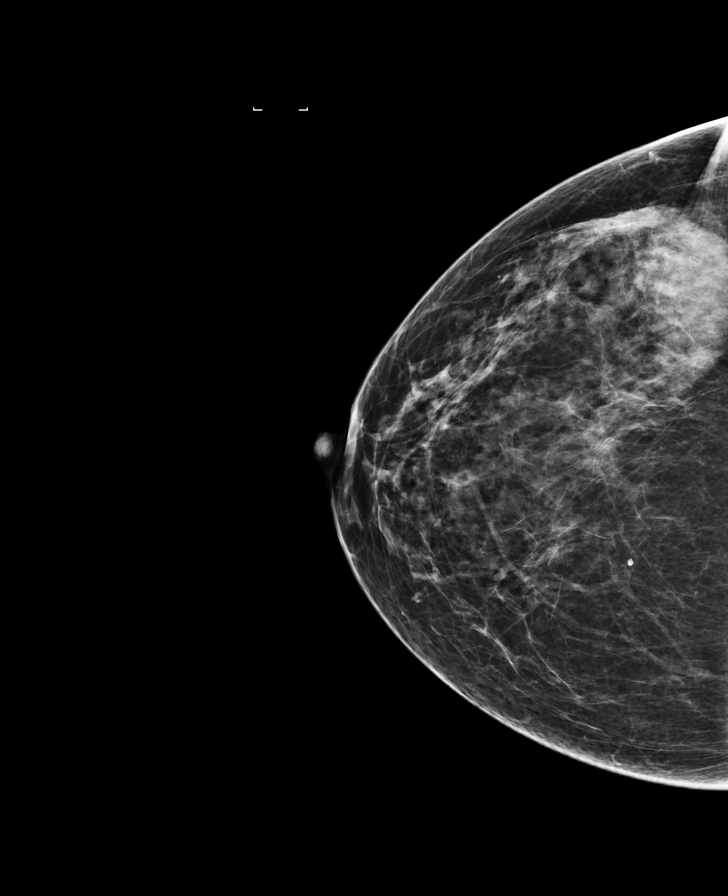

[L CC]
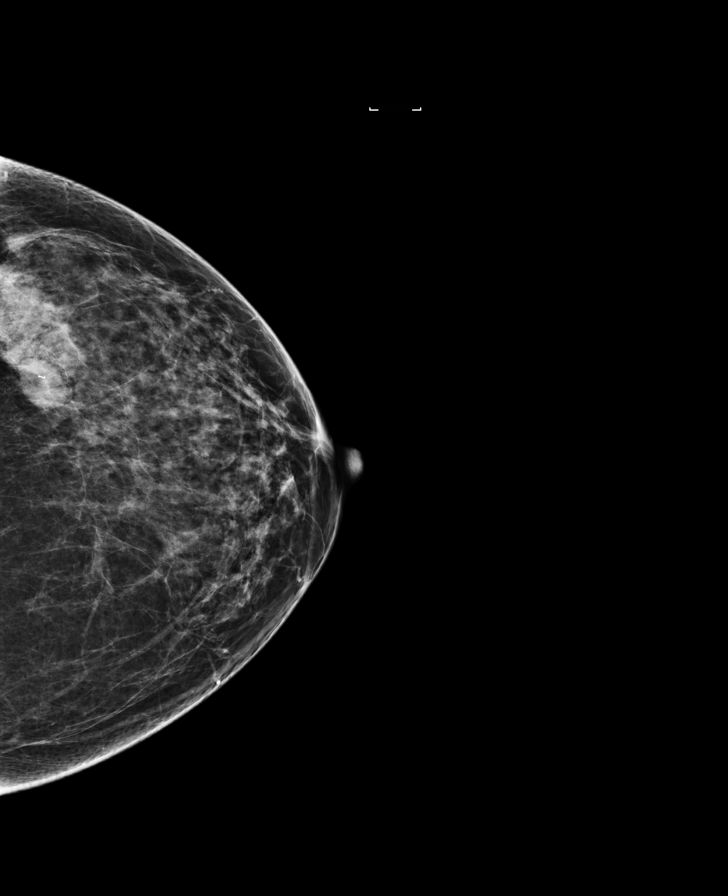

[L MLO]
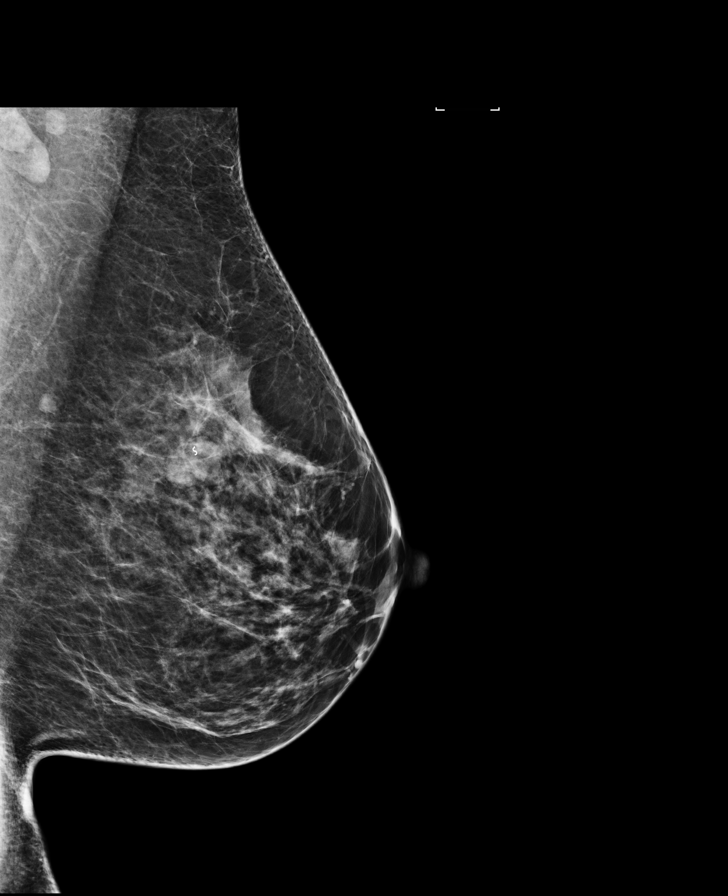

[R CC synth-2D]
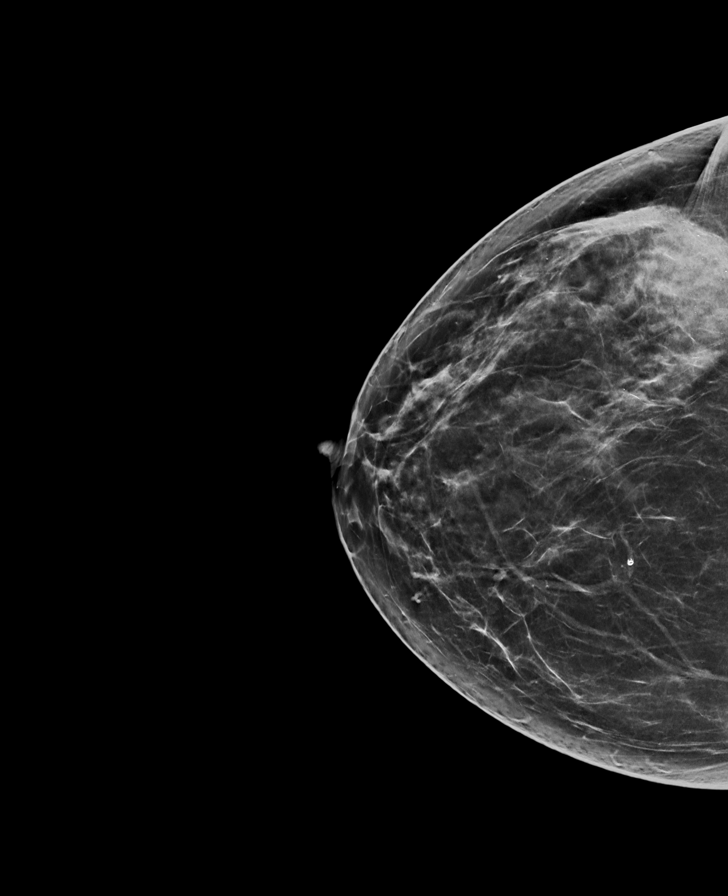

[L CC synth-2D]
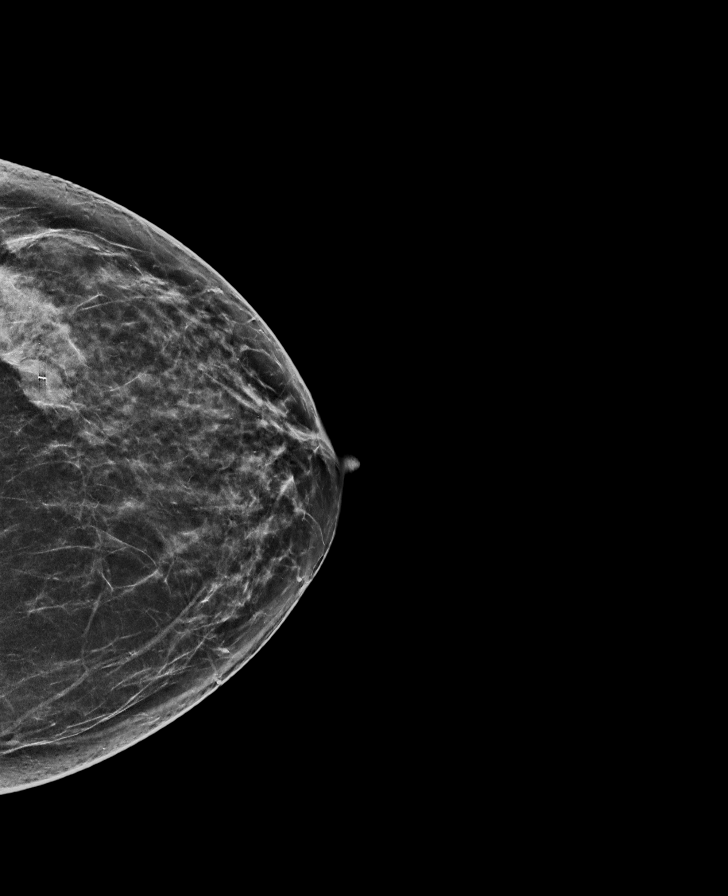

[R MLO synth-2D]
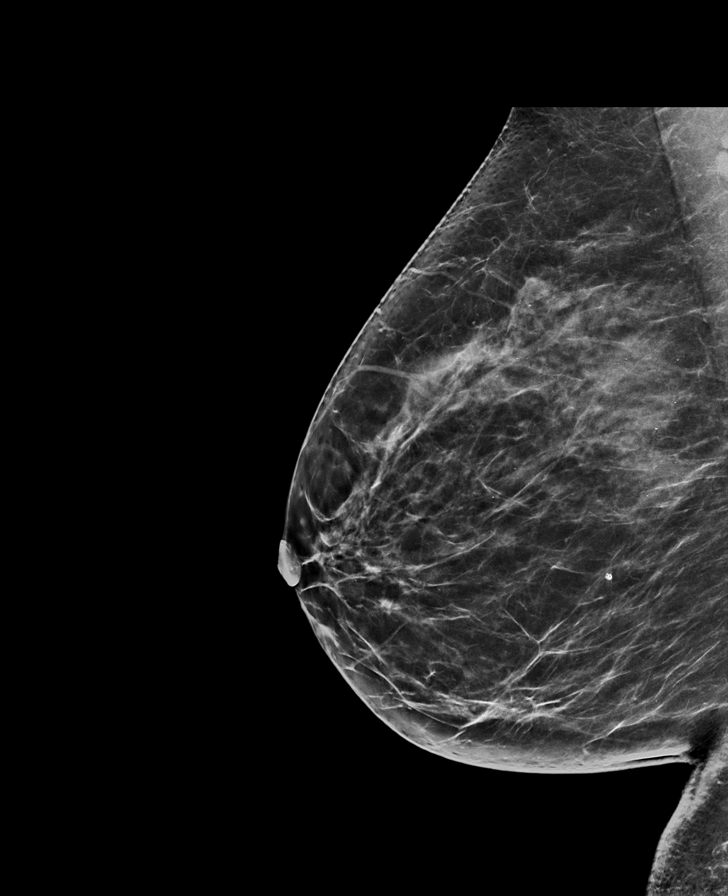

[R MLO]
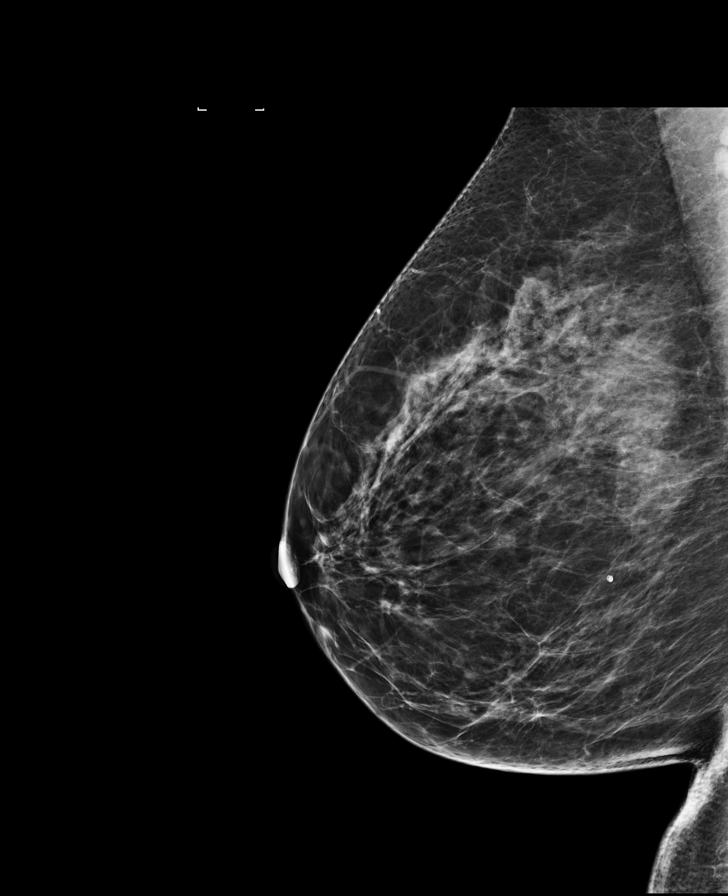

[L MLO synth-2D]
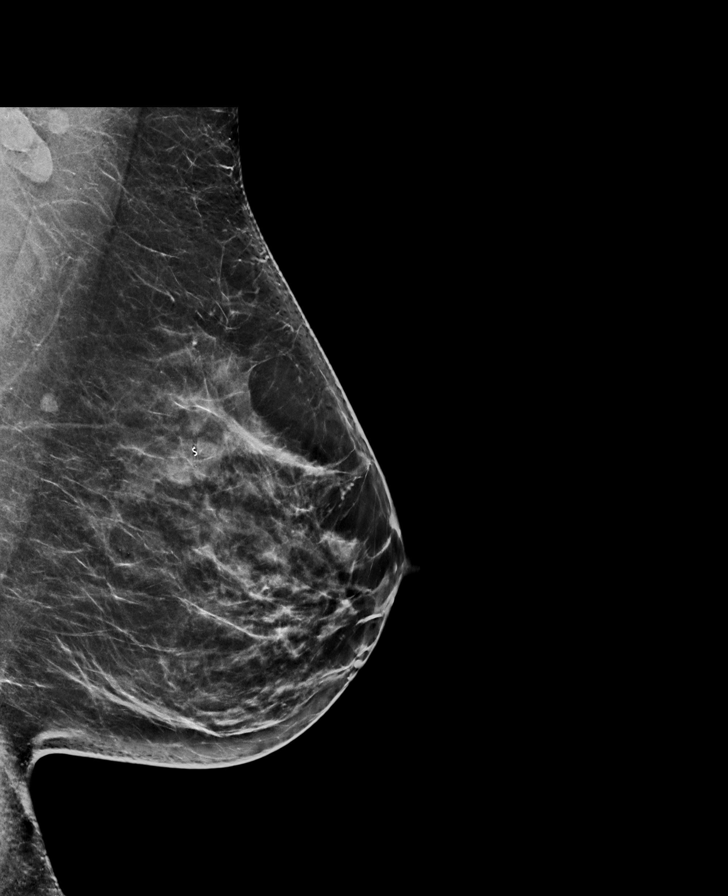

[8 of 28 positions shown; findings below may reference images not displayed]

ACR Breast Density Category c: The breast tissue is heterogeneously
dense, which may obscure small masses.
FINDINGS: There are no findings suspicious for malignancy. Images were
processed with CAD.
IMPRESSION: No mammographic evidence of malignancy. A result letter of this
screening mammogram will be mailed directly to the patient.

RECOMMENDATION:
Screening mammogram in one year. (Code:[TA])

BI-RADS CATEGORY  1: Negative.

## 2015-11-19 ENCOUNTER — Encounter: Payer: Self-pay | Admitting: *Deleted

## 2015-11-21 ENCOUNTER — Ambulatory Visit: Payer: 59 | Admitting: General Surgery

## 2015-11-28 ENCOUNTER — Ambulatory Visit (INDEPENDENT_AMBULATORY_CARE_PROVIDER_SITE_OTHER): Payer: 59 | Admitting: General Surgery

## 2015-11-28 ENCOUNTER — Encounter: Payer: Self-pay | Admitting: General Surgery

## 2015-11-28 VITALS — BP 122/68 | HR 72 | Resp 12 | Ht 67.0 in | Wt 168.0 lb

## 2015-11-28 DIAGNOSIS — Z803 Family history of malignant neoplasm of breast: Secondary | ICD-10-CM | POA: Diagnosis not present

## 2015-11-28 DIAGNOSIS — D242 Benign neoplasm of left breast: Secondary | ICD-10-CM

## 2015-11-28 NOTE — Progress Notes (Signed)
Patient ID: Emma Cuevas, female   DOB: 1965/10/09, 50 y.o.   MRN: [id number]  Chief Complaint  Patient presents with  . Follow-up    mammogram    HPI Emma Cuevas is a 50 y.o. female who presents for a breast evaluation. The most recent mammogram was done on 11/11/15. Patient does perform regular self breast checks and gets regular mammograms done. No new breast issues. Last year screening colonoscopy was completed and resulted normal.  I have reviewed the history of present illness with the patient.   HPI  Past Medical History  Diagnosis Date  . Family history of malignant neoplasm of breast 2013  . Breast mass, left 2013    benign  . Screening for obesity 2013  . Obesity, unspecified 2013  . Lump or mass in breast 2013    left breast  . Breast screening, unspecified 2013  . Benign neoplasm of breast 2013    left breast  . BRCA negative 2015    Past Surgical History  Procedure Laterality Date  . Breast surgery Left 10/16/2011    left breast finesse biopsy  . Abdominal hysterectomy  2011    partial  . Breast biopsy Left 2013    Negative  . Colonoscopy with propofol N/A 12/05/2014    Procedure: COLONOSCOPY WITH PROPOFOL;  Surgeon: Christene Lye, MD;  Location: [facility] ENDOSCOPY;  Service: Endoscopy;  Laterality: N/A;    Family History  Problem Relation Age of Onset  . Breast cancer Mother 36  . Breast cancer Maternal Aunt 99  . Cancer Maternal Grandfather     prostate    Social History Social History  Substance Use Topics  . Smoking status: Never Smoker   . Smokeless tobacco: Never Used  . Alcohol Use: No    No Known Allergies  Current Outpatient Prescriptions  Medication Sig Dispense Refill  . Vitamin D, Cholecalciferol, 1000 UNITS CAPS Take by mouth daily.     No current facility-administered medications for this visit.    Review of Systems Review of Systems  Constitutional: Negative.   Respiratory: Negative.   Cardiovascular: Negative.      Blood pressure 122/68, pulse 72, resp. rate 12, height '5\' 7"'  (1.702 m), weight 168 lb (76.204 kg).  Physical Exam Physical Exam  Constitutional: She is oriented to person, place, and time. She appears well-developed and well-nourished.  Eyes: Conjunctivae are normal. No scleral icterus.  Neck: Neck supple.  Cardiovascular: Normal rate, regular rhythm and normal heart sounds.   Pulmonary/Chest: Effort normal and breath sounds normal. Right breast exhibits no inverted nipple, no mass, no nipple discharge, no skin change and no tenderness. Left breast exhibits no inverted nipple, no mass, no nipple discharge, no skin change and no tenderness.  Abdominal: Soft. Bowel sounds are normal. There is no hepatomegaly. There is no tenderness.  Lymphadenopathy:    She has no cervical adenopathy.    She has no axillary adenopathy.  Neurological: She is alert and oriented to person, place, and time.  Skin: Skin is warm and dry.    Data Reviewed Mammogram reviewed and stable   Assessment    History of benign neoplasm of left breast Family history of breast cancer, BRCA negative    Plan    Follow up in one year with a bilateral screening mammogram and office visit. Continue with monthly self-exams.     PCP: None This has been scribed by Lesly Rubenstein LPN    Herald Vallin G 11/28/2015, 11:53 AM

## 2015-11-28 NOTE — Patient Instructions (Signed)
Follow-up in one year. Contact the office with any concerns.

## 2016-09-03 ENCOUNTER — Other Ambulatory Visit: Payer: Self-pay

## 2016-09-03 DIAGNOSIS — Z1231 Encounter for screening mammogram for malignant neoplasm of breast: Secondary | ICD-10-CM

## 2016-09-28 ENCOUNTER — Other Ambulatory Visit: Payer: Self-pay | Admitting: Internal Medicine

## 2016-09-28 DIAGNOSIS — N289 Disorder of kidney and ureter, unspecified: Secondary | ICD-10-CM

## 2016-10-05 DIAGNOSIS — N289 Disorder of kidney and ureter, unspecified: Secondary | ICD-10-CM | POA: Insufficient documentation

## 2016-10-05 DIAGNOSIS — Z780 Asymptomatic menopausal state: Secondary | ICD-10-CM | POA: Insufficient documentation

## 2016-10-06 ENCOUNTER — Ambulatory Visit
Admission: RE | Admit: 2016-10-06 | Discharge: 2016-10-06 | Disposition: A | Discharge status: Home / Self Care | Payer: 59 | Source: Ambulatory Visit | Attending: Internal Medicine | Admitting: Internal Medicine

## 2016-10-06 DIAGNOSIS — K8020 Calculus of gallbladder without cholecystitis without obstruction: Secondary | ICD-10-CM | POA: Insufficient documentation

## 2016-10-06 DIAGNOSIS — N289 Disorder of kidney and ureter, unspecified: Secondary | ICD-10-CM | POA: Diagnosis present

## 2016-11-09 ENCOUNTER — Ambulatory Visit
Admission: RE | Admit: 2016-11-09 | Discharge: 2016-11-09 | Disposition: A | Discharge status: Home / Self Care | Payer: 59 | Source: Ambulatory Visit | Attending: General Surgery | Admitting: General Surgery

## 2016-11-09 DIAGNOSIS — Z1231 Encounter for screening mammogram for malignant neoplasm of breast: Secondary | ICD-10-CM

## 2016-11-09 IMAGING — MG MM DIGITAL SCREENING BILAT W/ TOMO W/ CAD
8 of 13 series · 8 of 29 positions shown · non-contrast
Comparison: Previous exam(s).

CLINICAL DATA: Screening.

EXAM:
2D DIGITAL SCREENING BILATERAL MAMMOGRAM WITH CAD AND ADJUNCT TOMO

[R XCCL]
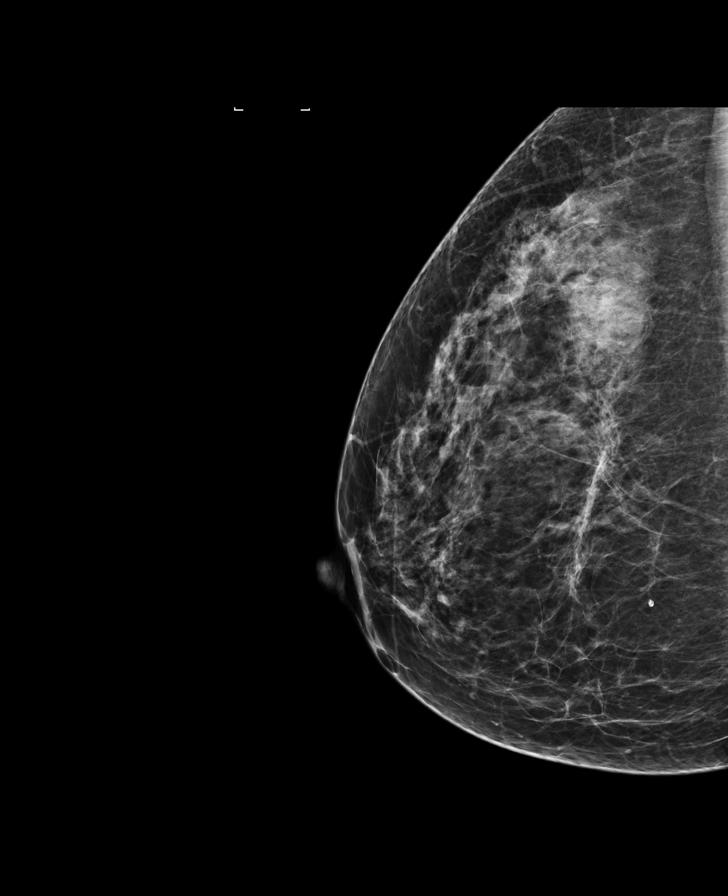

[L MLO]
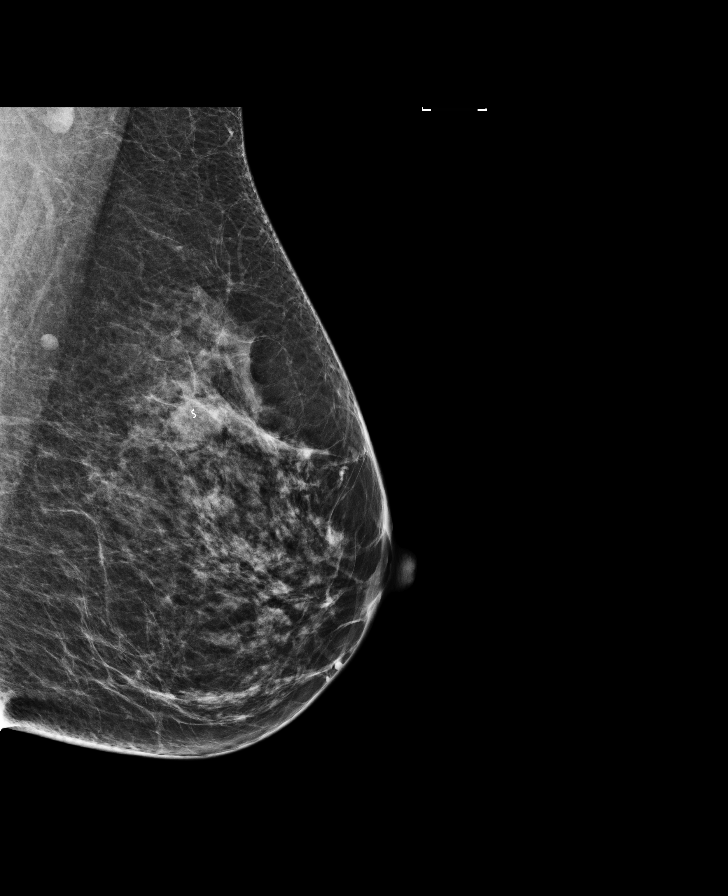

[L CC synth-2D]
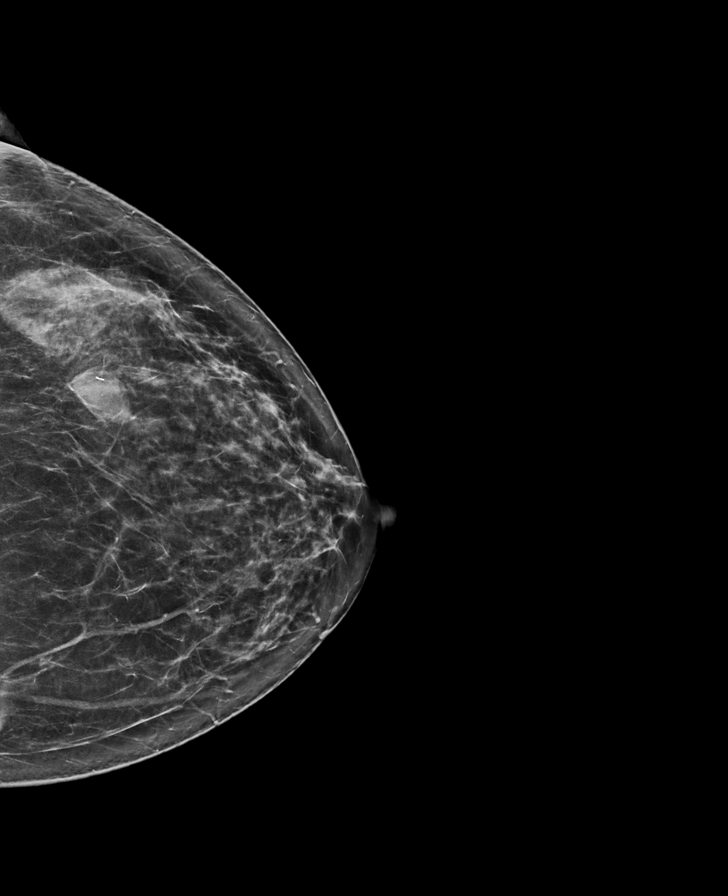

[R CC synth-2D]
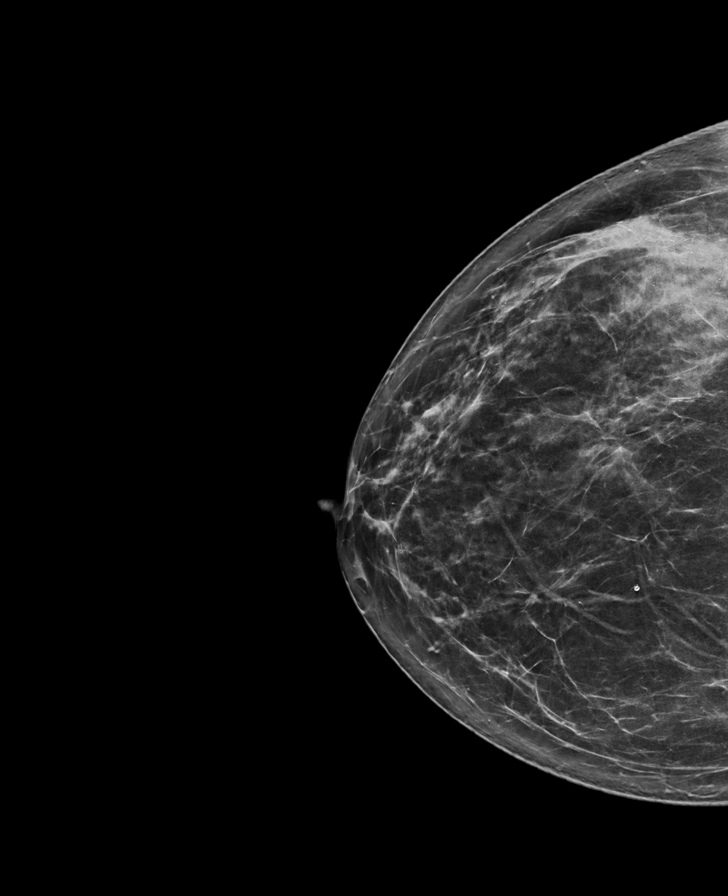

[R MLO]
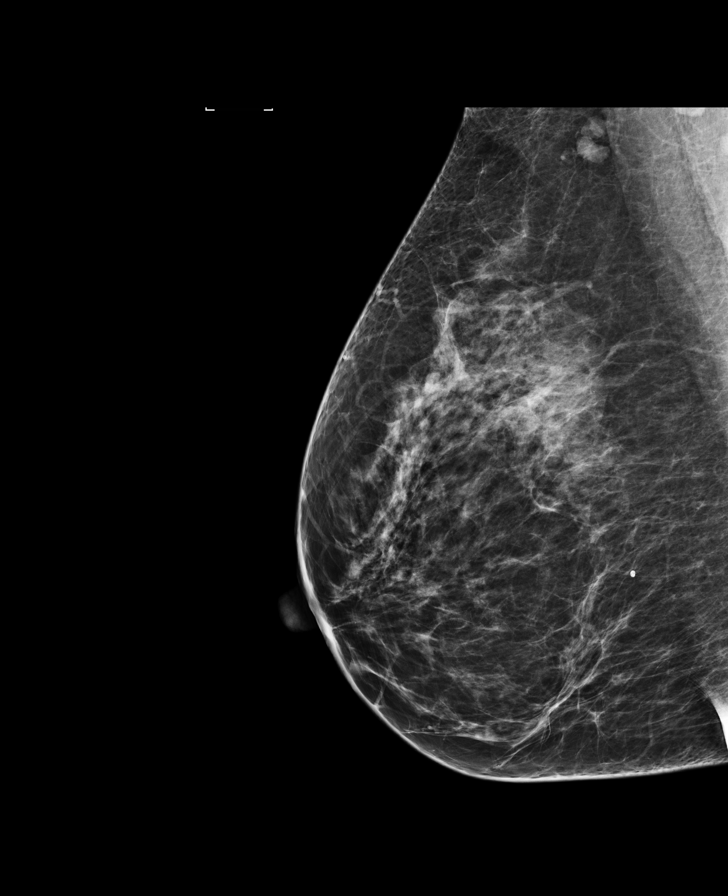

[L CC]
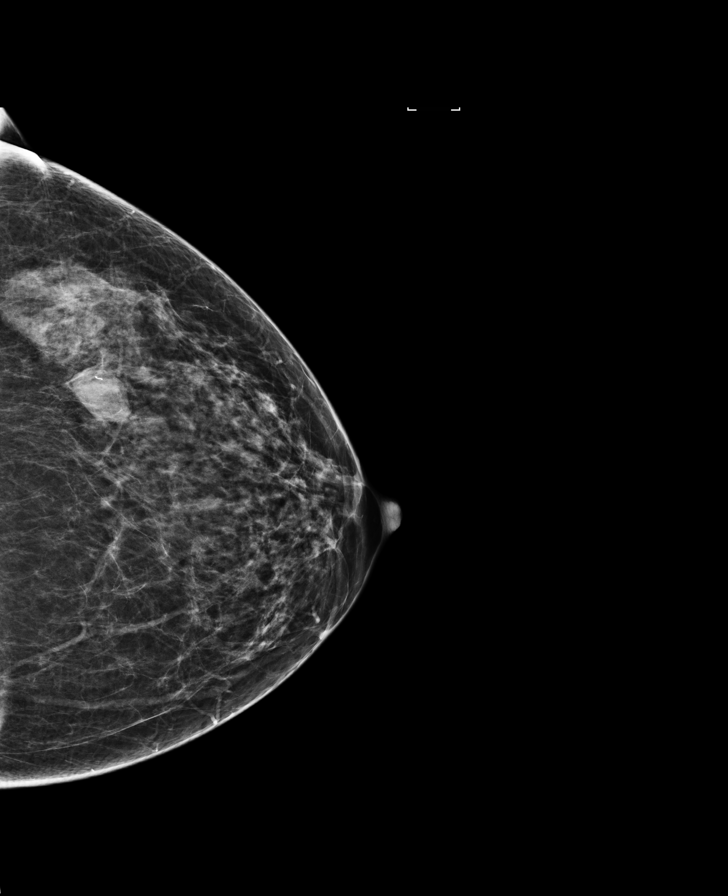

[R MLO synth-2D]
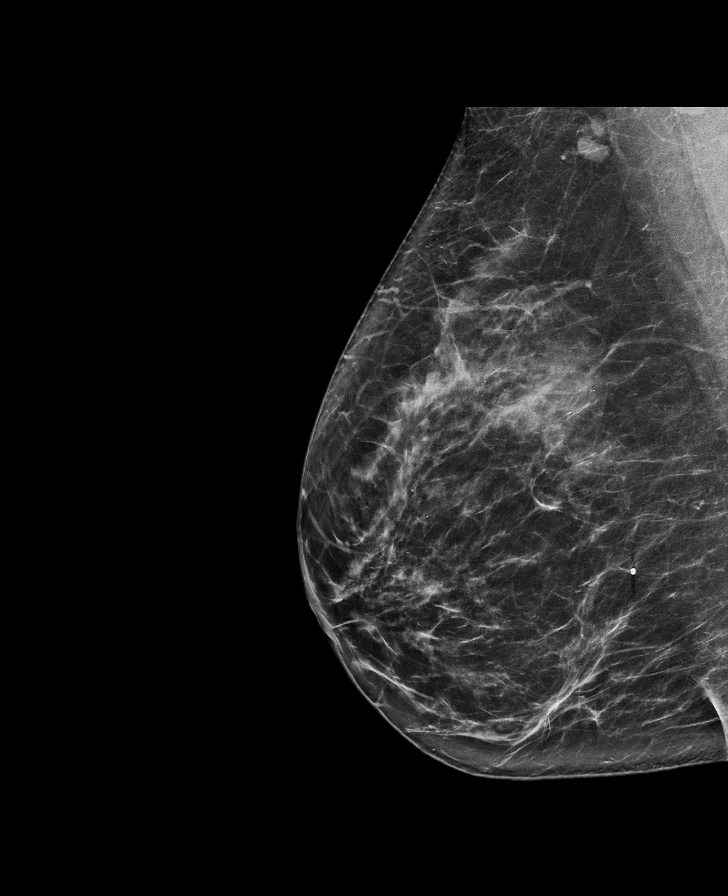

[R CC]
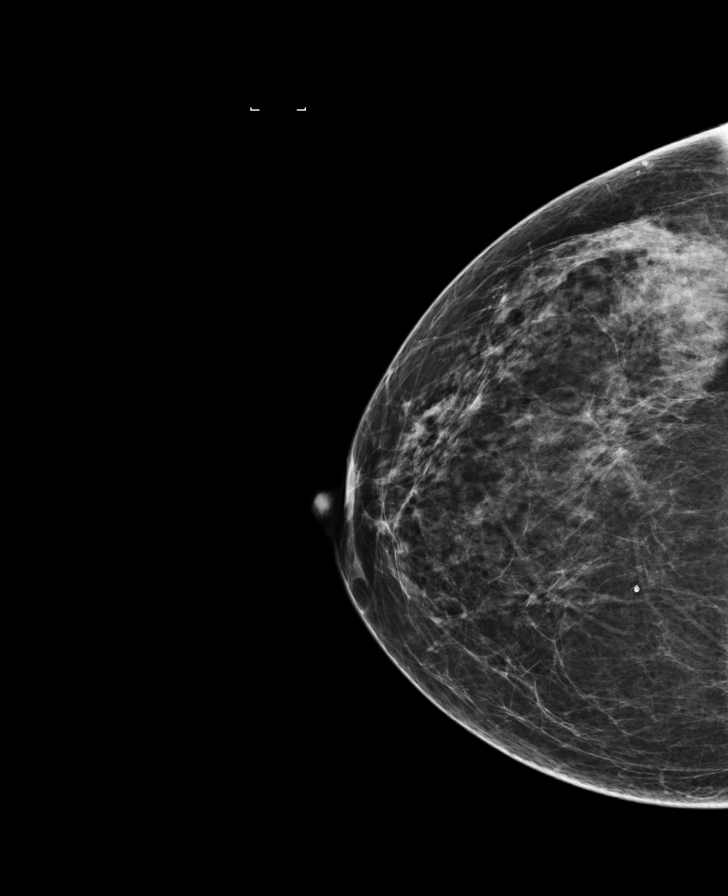

[8 of 29 positions shown; findings below may reference images not displayed]

ACR Breast Density Category c: The breast tissue is heterogeneously
dense, which may obscure small masses.
FINDINGS: There are no findings suspicious for malignancy. Images were
processed with CAD.
IMPRESSION: No mammographic evidence of malignancy. A result letter of this
screening mammogram will be mailed directly to the patient.

RECOMMENDATION:
Screening mammogram in one year. (Code:[TA])

BI-RADS CATEGORY  1: Negative.

## 2016-11-10 ENCOUNTER — Encounter: Payer: Self-pay | Admitting: *Deleted

## 2016-11-16 ENCOUNTER — Ambulatory Visit (INDEPENDENT_AMBULATORY_CARE_PROVIDER_SITE_OTHER): Payer: 59 | Admitting: General Surgery

## 2016-11-16 ENCOUNTER — Encounter: Payer: Self-pay | Admitting: General Surgery

## 2016-11-16 VITALS — BP 128/74 | HR 70 | Resp 12 | Ht 66.0 in | Wt 179.0 lb

## 2016-11-16 DIAGNOSIS — Z803 Family history of malignant neoplasm of breast: Secondary | ICD-10-CM

## 2016-11-16 DIAGNOSIS — D242 Benign neoplasm of left breast: Secondary | ICD-10-CM

## 2016-11-16 NOTE — Patient Instructions (Signed)
Follow up in 1 year with PCP for bilateral screening mammogram. Advised to call office with any questions or concerns.

## 2016-11-16 NOTE — Progress Notes (Signed)
Patient ID: Emma Cuevas, female   DOB: 06-10-1965, 51 y.o.   MRN: [id number]  Chief Complaint  Patient presents with  . Follow-up    mammogram     HPI Emma Cuevas is a 51 y.o. female who presents for a breast evaluation. The most recent mammogram was done on 11/09/2016.  Patient does perform regular self breast checks and gets regular mammograms done.    HPI  Past Medical History:  Diagnosis Date  . 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 BRCA negative 2015  . Family history of malignant neoplasm of breast 2013  . Screening for obesity     Past Surgical History:  Procedure Laterality Date  . ABDOMINAL HYSTERECTOMY  2011   partial  . BREAST SURGERY Left 10/16/2011   left breast finesse biopsy  . COLONOSCOPY WITH PROPOFOL N/A 12/05/2014   Procedure: COLONOSCOPY WITH PROPOFOL;  Surgeon: Christene Lye, MD;  Location: [facility] ENDOSCOPY;  Service: Endoscopy;  Laterality: N/A;    Family History  Problem Relation Age of Onset  . Breast cancer Mother 91  . Breast cancer Maternal Aunt 66  . Cancer Maternal Grandfather        prostate  . Bladder Cancer Father     Social History Social History  Substance Use Topics  . Smoking status: Never Smoker  . Smokeless tobacco: Never Used  . Alcohol use No    No Known Allergies  Current Outpatient Prescriptions  Medication Sig Dispense Refill  . Vitamin D, Cholecalciferol, 1000 UNITS CAPS Take by mouth daily.     No current facility-administered medications for this visit.     Review of Systems Review of Systems  Constitutional: Negative.   Respiratory: Negative.   Cardiovascular: Negative.     Blood pressure 128/74, pulse 70, resp. rate 12, height '5\' 6"'$  (1.676 m), weight 179 lb (81.2 kg).  Physical Exam Physical Exam  Constitutional: She is oriented to person, place, and time. She appears well-developed and well-nourished.  Eyes: Conjunctivae are normal. No scleral icterus.  Neck: Neck supple.   Cardiovascular: Normal rate, regular rhythm and normal heart sounds.   Pulmonary/Chest: Effort normal and breath sounds normal. Right breast exhibits no inverted nipple, no mass, no nipple discharge, no skin change and no tenderness. Left breast exhibits no inverted nipple, no mass, no nipple discharge, no skin change and no tenderness.  Abdominal: Soft. Normal appearance and bowel sounds are normal.  Lymphadenopathy:    She has no cervical adenopathy.    She has no axillary adenopathy.  Neurological: She is alert and oriented to person, place, and time.  Skin: Skin is warm and dry.  Psychiatric: She has a normal mood and affect. Her behavior is normal.    Data Reviewed Prior notes and mammogram reviewed.  Assessment    History of benign neoplasm of left breast- biopsy results normal Family history of breast cancer, BRCA negative      Plan   Advised on continued need for yearly mammogram and breast exam.  Follow up in 1 year with PCP for bilateral screening mammogram. Advised to call office with any questions or concerns.     HPI, Physical Exam, Assessment and Plan have been scribed under the direction and in the presence of Mckinley Jewel, MD  Gaspar Cola, CMA  I have completed the exam and reviewed the above documentation for accuracy and completeness.  I agree with the above.  Haematologist has been used and any  errors in dictation or transcription are unintentional.  Seeplaputhur G. Jamal Collin, M.D., F.A.C.S.   Junie Panning G 11/16/2016, 10:08 AM

## 2017-05-18 IMAGING — US US RENAL
1 series · 14 of 25 positions shown · non-contrast
Comparison: None.

CLINICAL DATA: Initial evaluation for renal insufficiency.

EXAM:
RENAL / URINARY TRACT ULTRASOUND COMPLETE

[Series 1: us renal · 0.23mm/px · 14 of 55 slices shown]
[im 1/55]
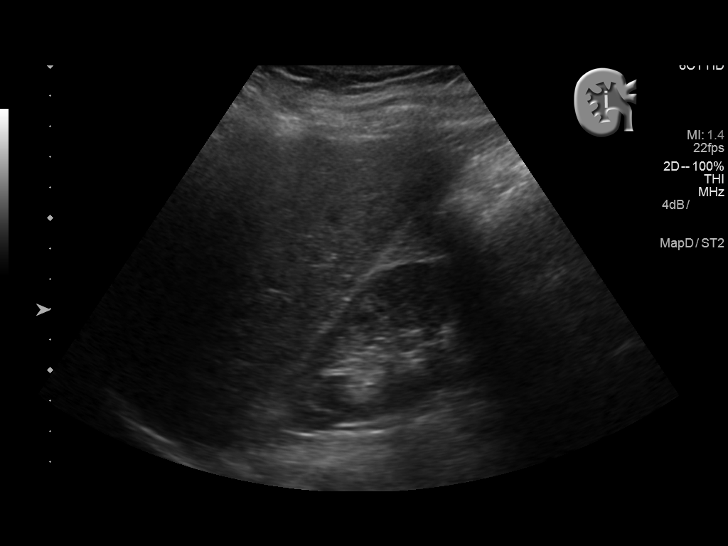
[im 5/55]
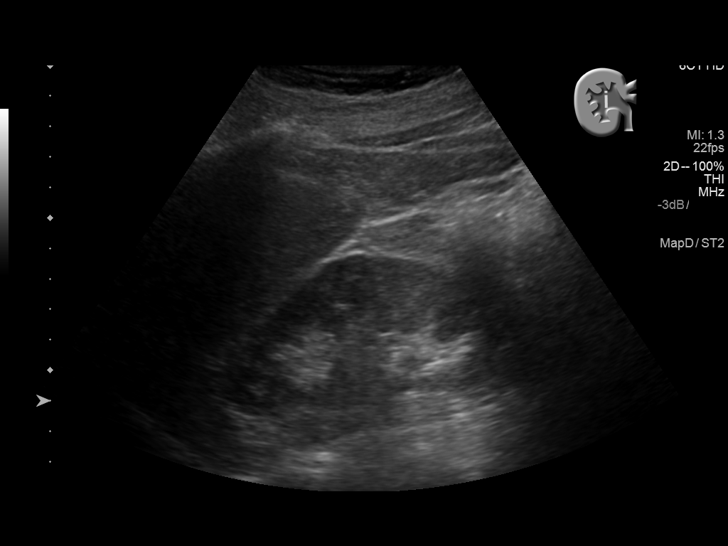
[im 10/55]
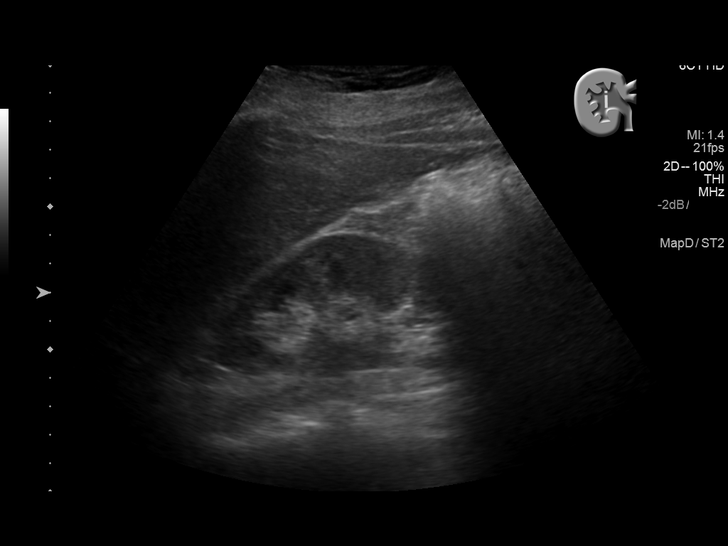
[im 14/55]
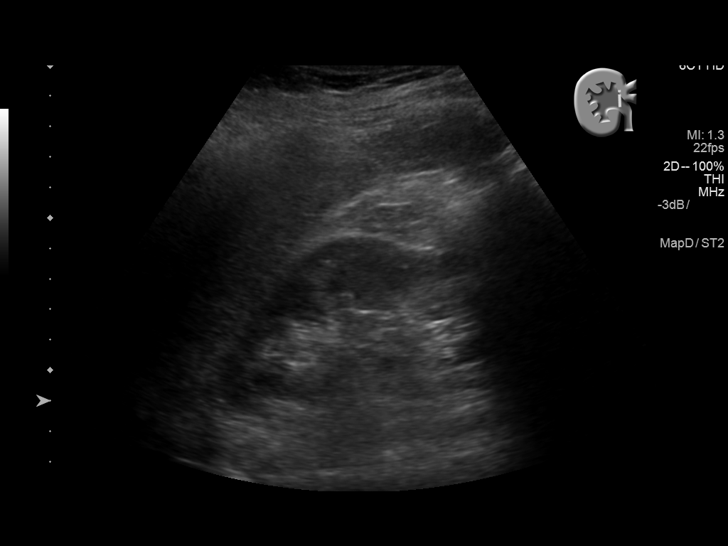
[im 19/55]
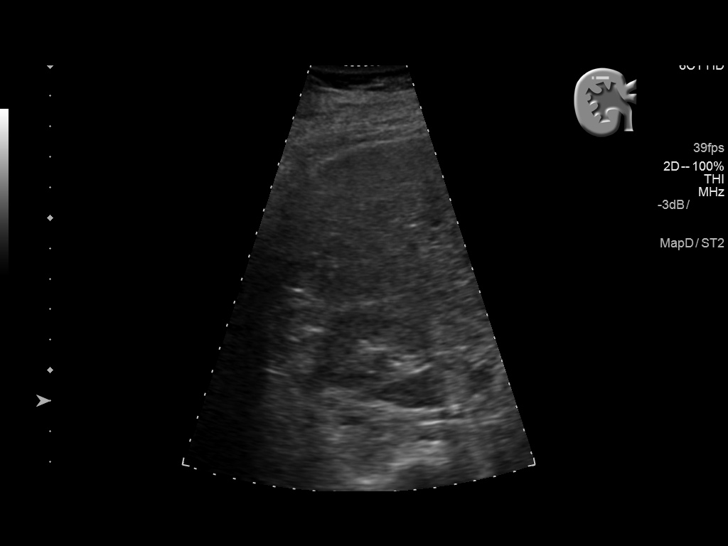
[im 21/55]
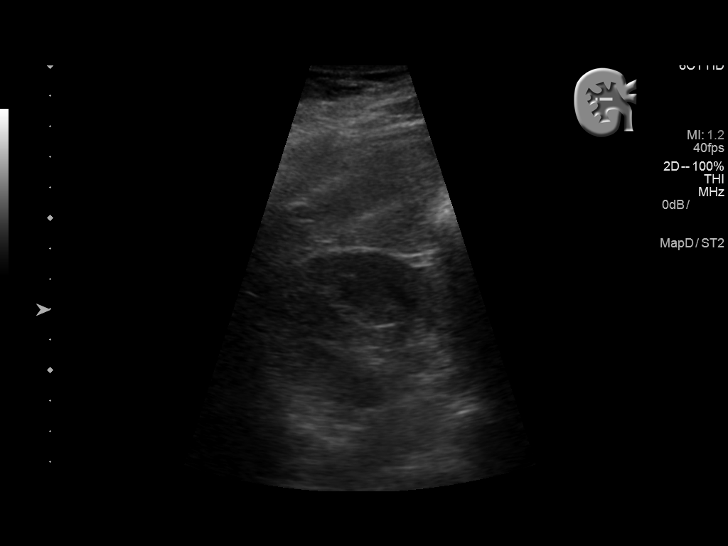
[im 25/55]
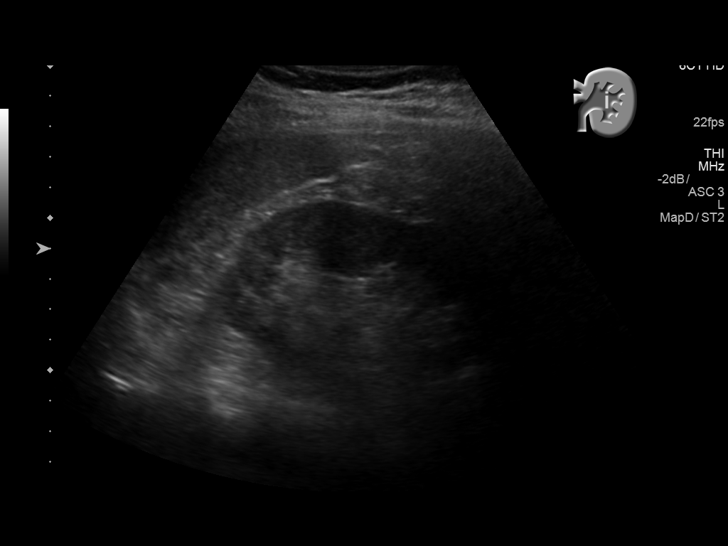
[im 30/55]
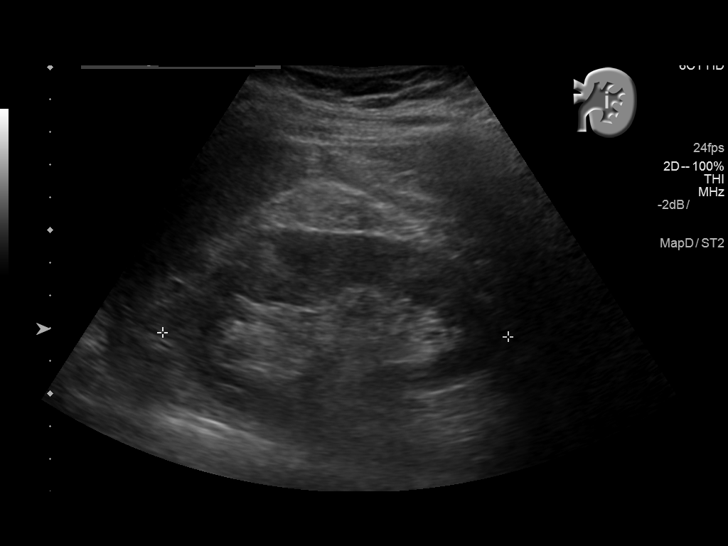
[im 34/55]
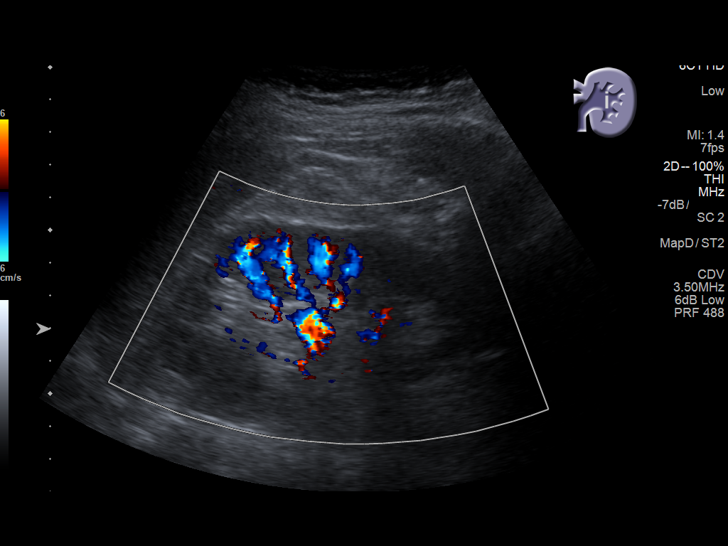
[im 37/55]
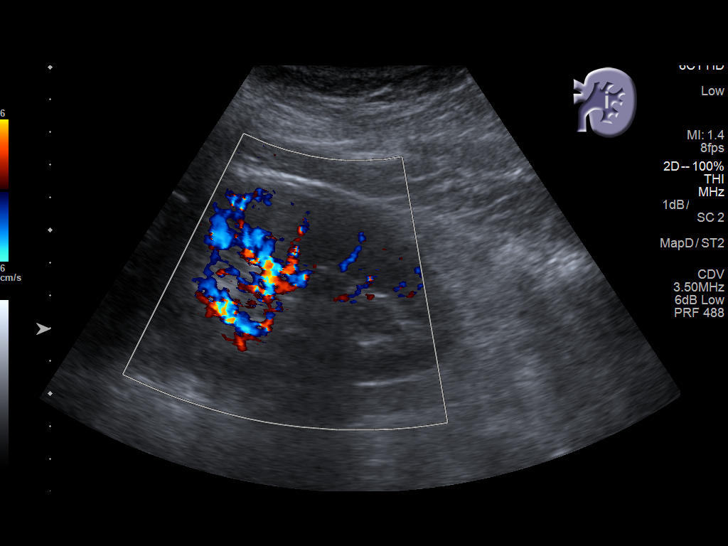
[im 41/55]
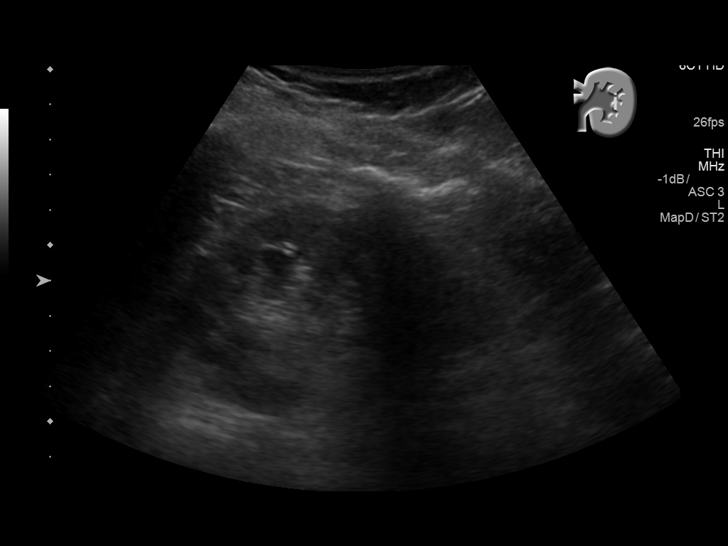
[im 46/55]
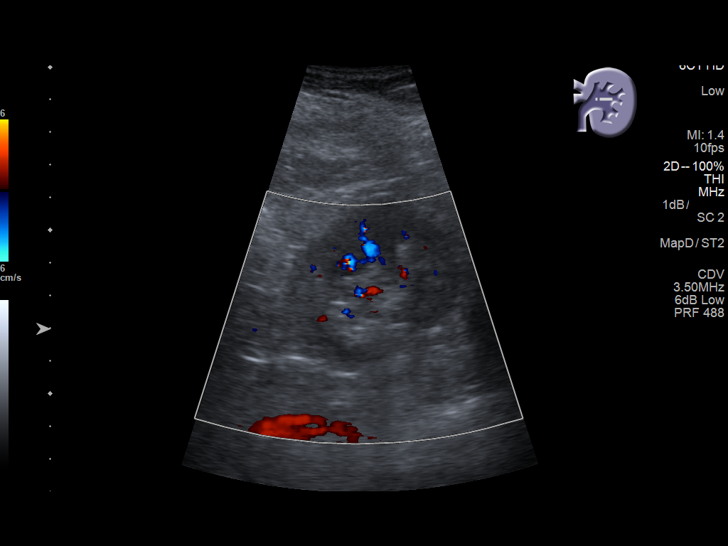
[im 50/55]
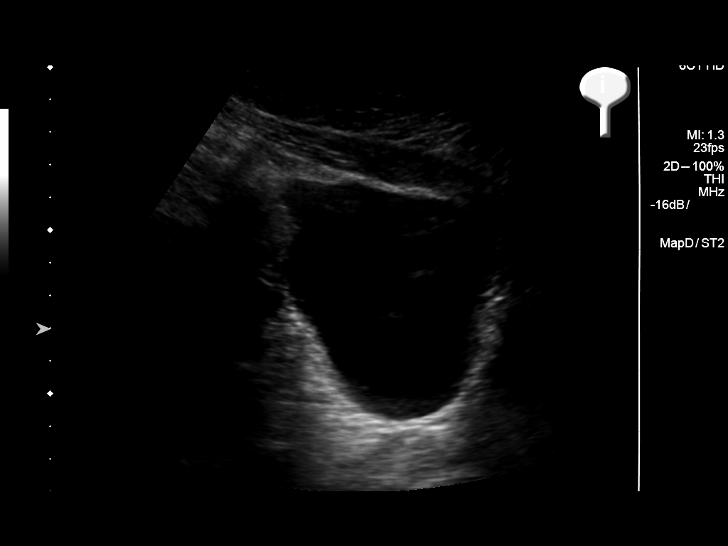
[im 55/55]
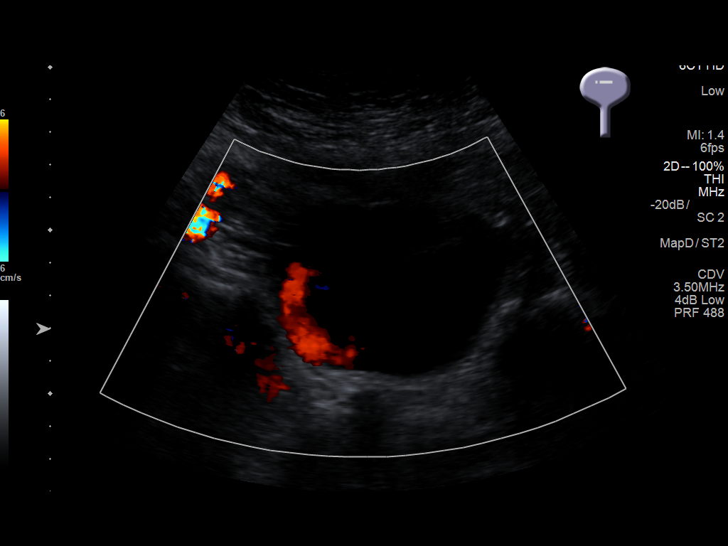

[14 of 25 positions shown; findings below may reference images not displayed]

FINDINGS: Right Kidney:

Length: 10.0 cm. Echogenicity within normal limits. No mass or
hydronephrosis visualized.

Left Kidney:

Length: 10.6 cm. Echogenicity within normal limits. No mass or
hydronephrosis visualized.

Bladder:

Appears normal for degree of bladder distention.

Incidental echogenic stone noted within the gallbladder lumen.
IMPRESSION: 1. Normal renal ultrasound.  No hydronephrosis.
2. Cholelithiasis.

## 2017-11-22 ENCOUNTER — Other Ambulatory Visit: Payer: Self-pay | Admitting: Internal Medicine

## 2017-11-22 DIAGNOSIS — Z1231 Encounter for screening mammogram for malignant neoplasm of breast: Secondary | ICD-10-CM

## 2017-12-07 ENCOUNTER — Ambulatory Visit: Payer: Self-pay | Admitting: Podiatry

## 2017-12-09 ENCOUNTER — Ambulatory Visit
Admission: RE | Admit: 2017-12-09 | Discharge: 2017-12-09 | Disposition: A | Discharge status: Home / Self Care | Payer: Managed Care, Other (non HMO) | Source: Ambulatory Visit | Attending: Internal Medicine | Admitting: Internal Medicine

## 2017-12-09 DIAGNOSIS — Z1231 Encounter for screening mammogram for malignant neoplasm of breast: Secondary | ICD-10-CM

## 2017-12-09 IMAGING — MG MM DIGITAL SCREENING BILAT W/ TOMO W/ CAD
8 series · 8 of 24 positions shown · non-contrast
Comparison: Previous exam(s).

CLINICAL DATA: Screening.

EXAM:
DIGITAL SCREENING BILATERAL MAMMOGRAM WITH TOMO AND CAD

[L CC synth-2D]
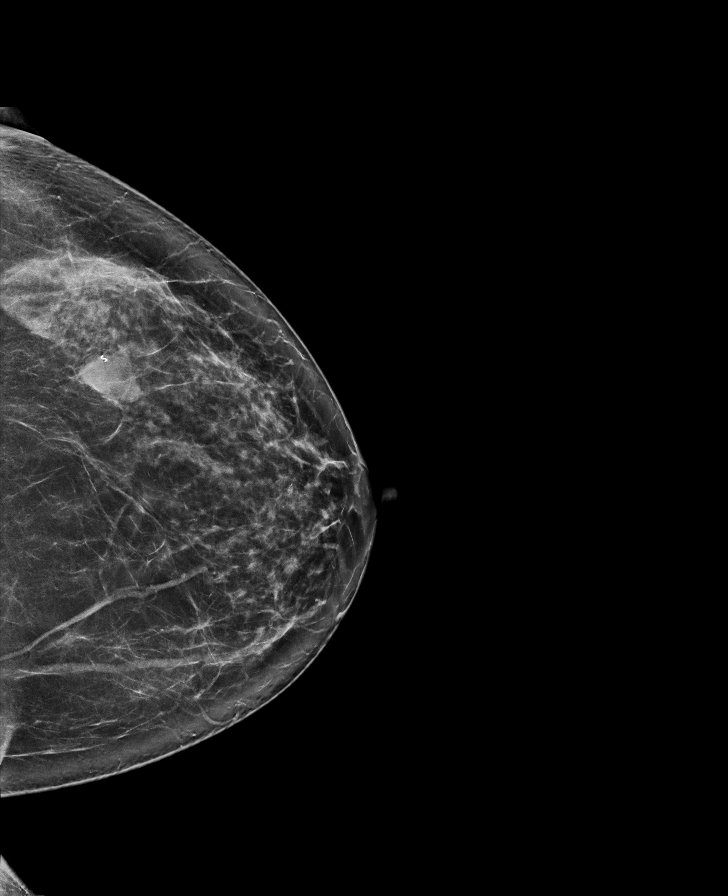

[R MLO synth-2D]
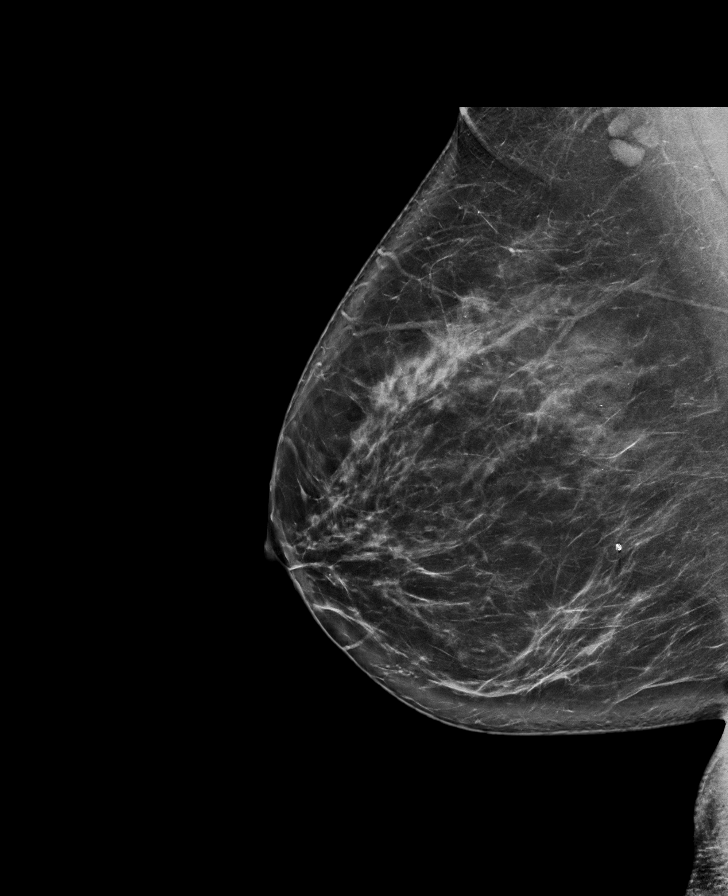

[L MLO synth-2D]
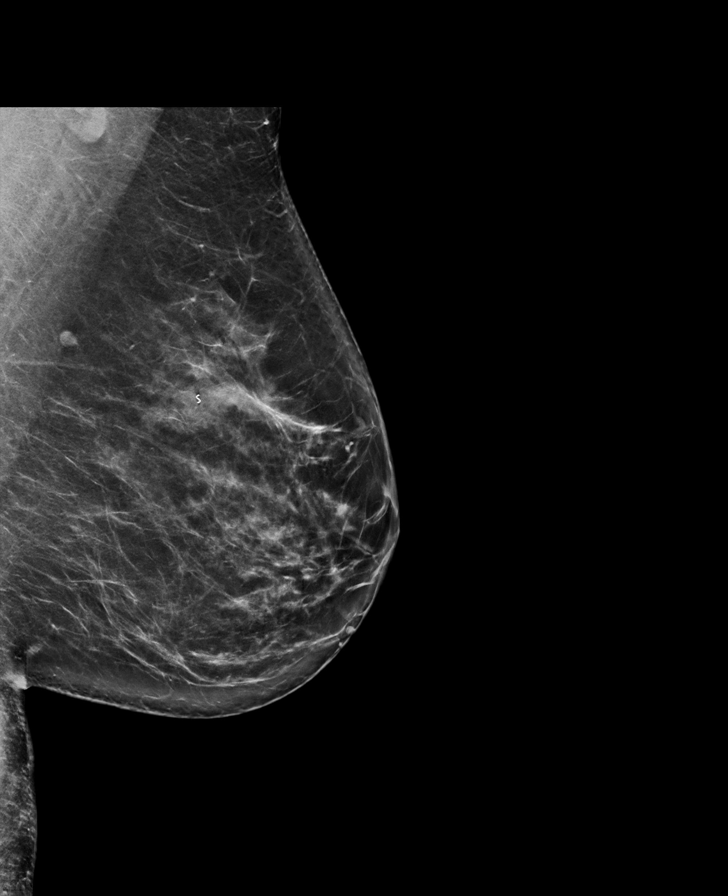

[R CC synth-2D]
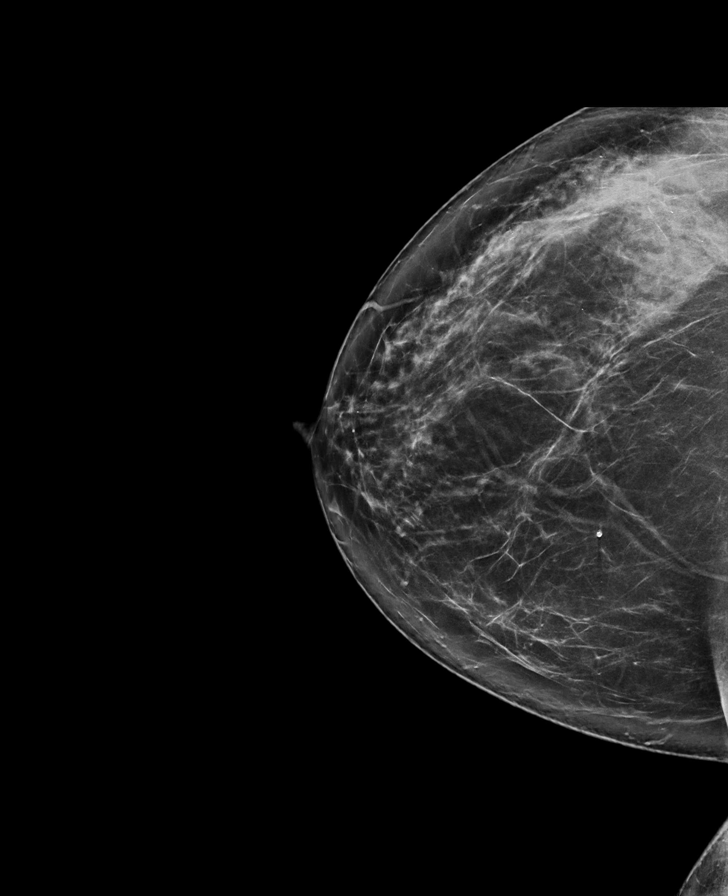

[L CC tomo · tomo slice 41/80.0]
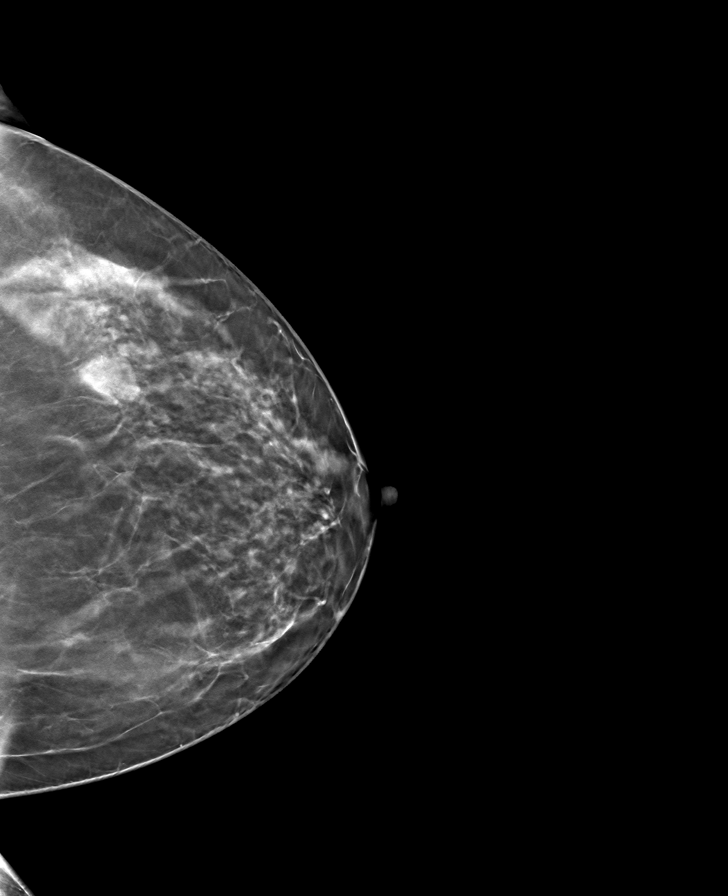

[R CC tomo · tomo slice 44/87.0]
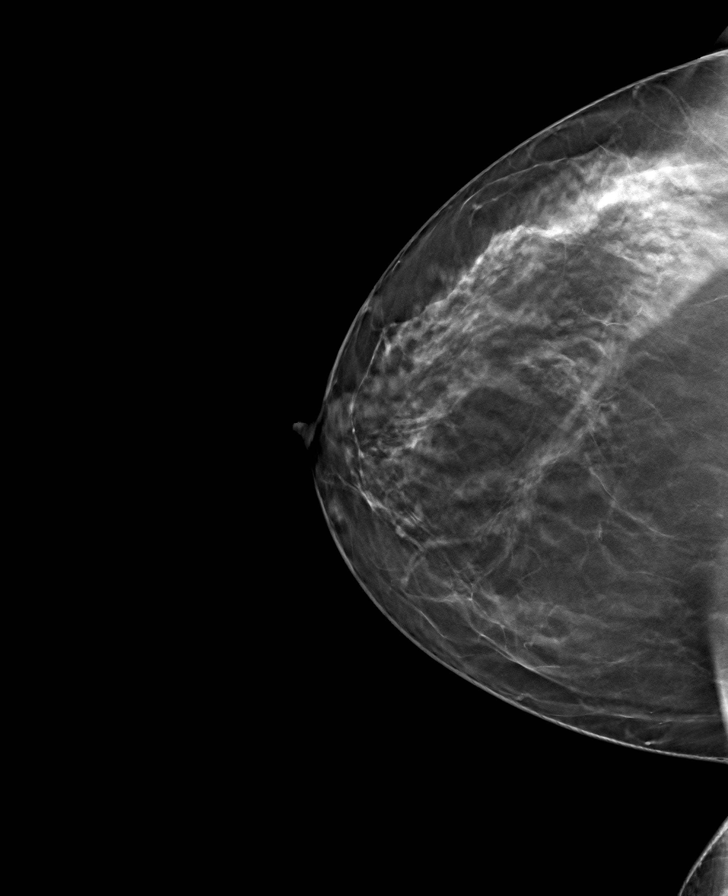

[L MLO tomo · tomo slice 46/91.0]
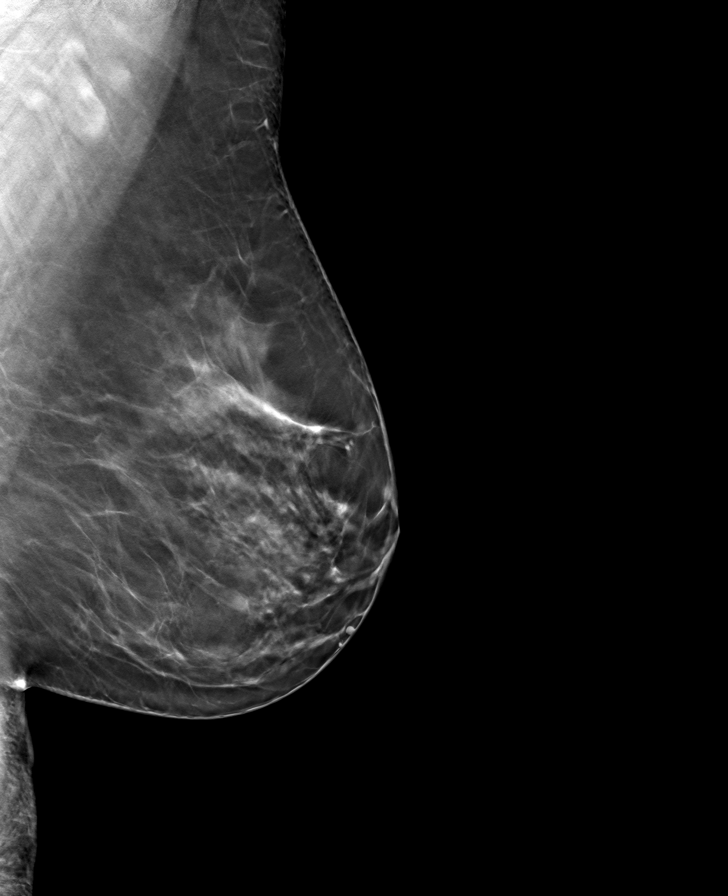

[R MLO tomo · tomo slice 45/90.0]
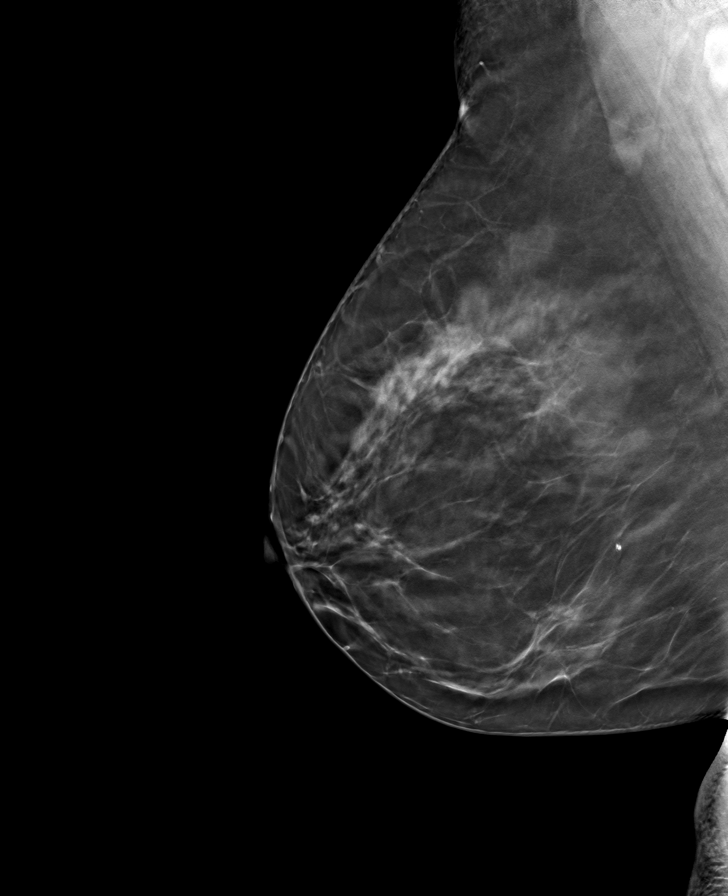

[8 of 24 positions shown; findings below may reference images not displayed]

ACR Breast Density Category c: The breast tissue is heterogeneously
dense, which may obscure small masses.
FINDINGS: There are no findings suspicious for malignancy. Images were
processed with CAD.
IMPRESSION: No mammographic evidence of malignancy. A result letter of this
screening mammogram will be mailed directly to the patient.

RECOMMENDATION:
Screening mammogram in one year. (Code:[5V])

BI-RADS CATEGORY  1: Negative.

## 2018-03-21 ENCOUNTER — Ambulatory Visit: Payer: Self-pay | Admitting: Podiatry

## 2019-01-24 ENCOUNTER — Other Ambulatory Visit: Payer: Self-pay | Admitting: Family Medicine

## 2019-01-24 DIAGNOSIS — Z1231 Encounter for screening mammogram for malignant neoplasm of breast: Secondary | ICD-10-CM

## 2019-02-09 ENCOUNTER — Ambulatory Visit
Admission: RE | Admit: 2019-02-09 | Discharge: 2019-02-09 | Disposition: A | Discharge status: Home / Self Care | Payer: Managed Care, Other (non HMO) | Source: Ambulatory Visit | Attending: Family Medicine | Admitting: Family Medicine

## 2019-02-09 DIAGNOSIS — Z1231 Encounter for screening mammogram for malignant neoplasm of breast: Secondary | ICD-10-CM | POA: Diagnosis present

## 2019-02-09 IMAGING — MG MM DIGITAL SCREENING BILAT W/ TOMO W/ CAD
8 series · 8 of 24 positions shown · non-contrast
Comparison: Previous exam(s).

CLINICAL DATA: Screening.

EXAM:
DIGITAL SCREENING BILATERAL MAMMOGRAM WITH TOMO AND CAD

[L MLO synth-2D]
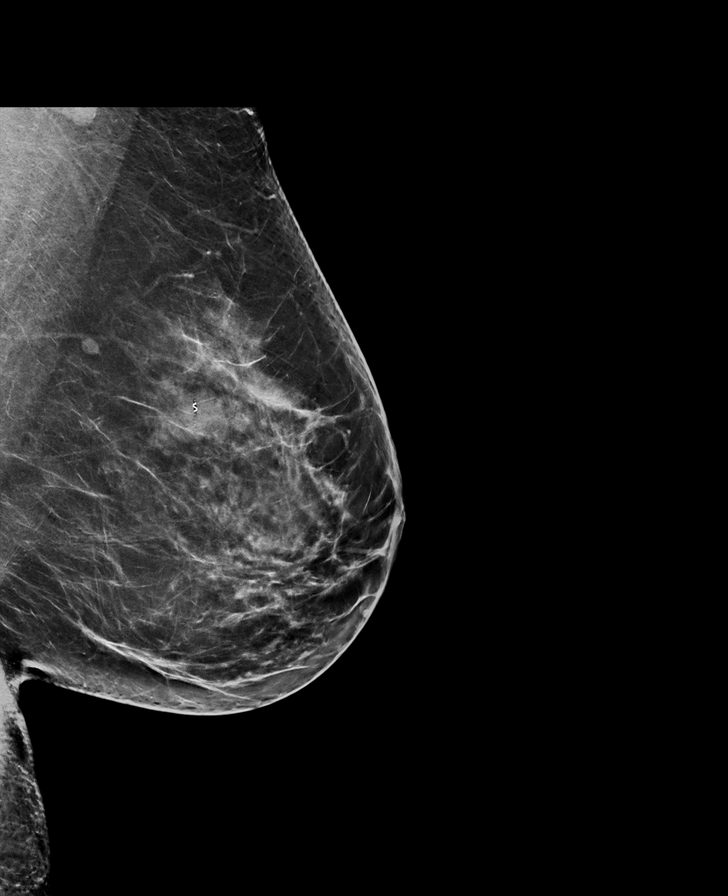

[R MLO synth-2D]
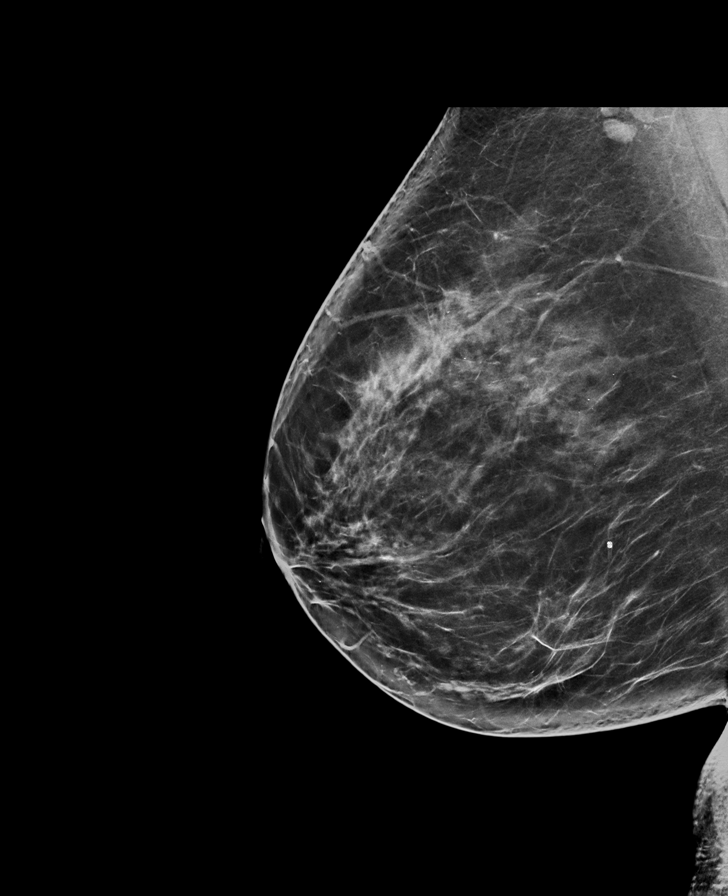

[L CC synth-2D]
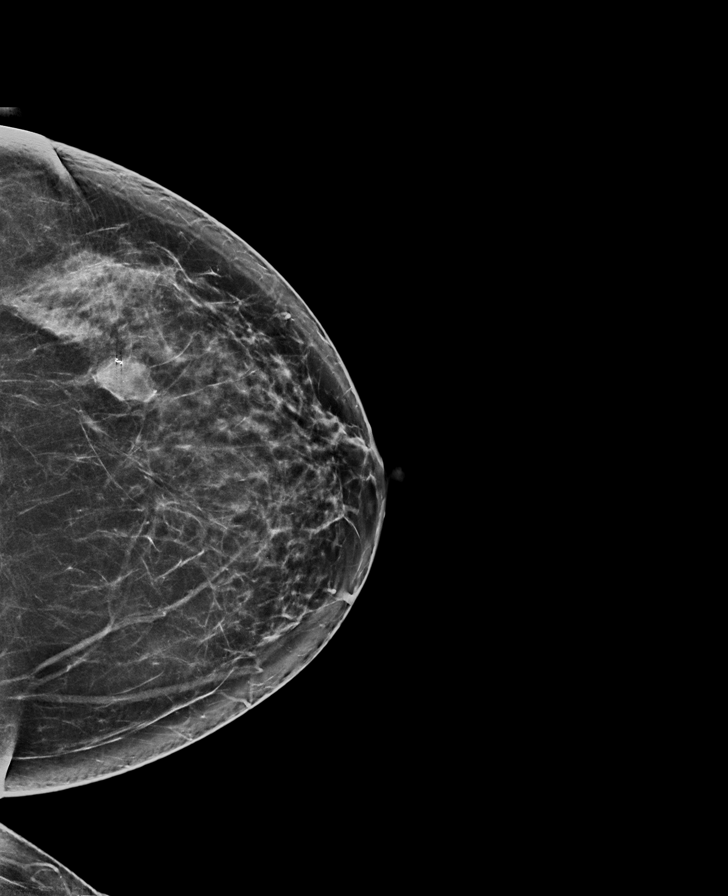

[R CC synth-2D]
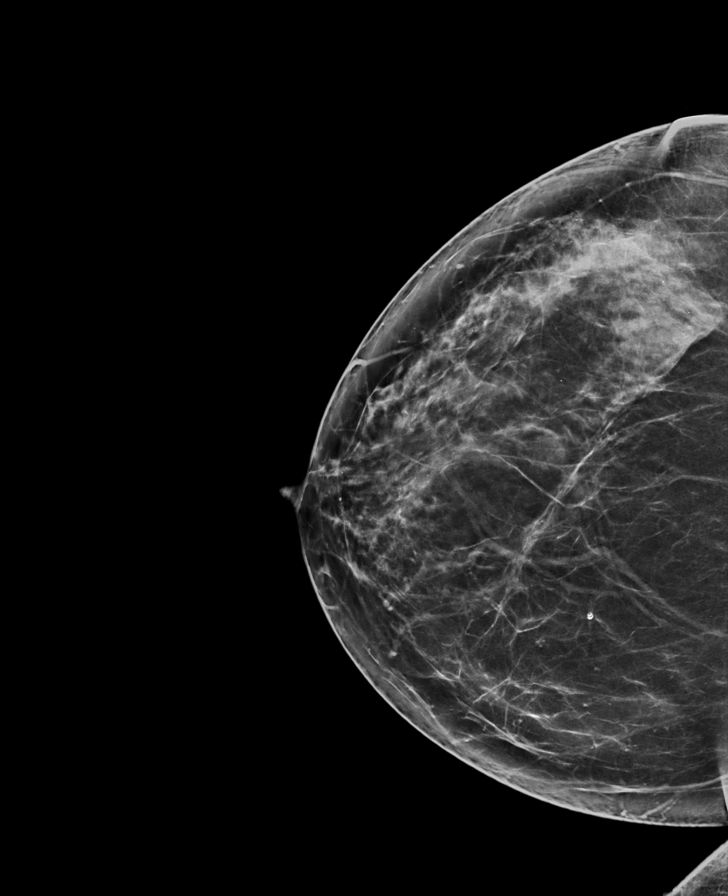

[L MLO tomo · tomo slice 43/86.0]
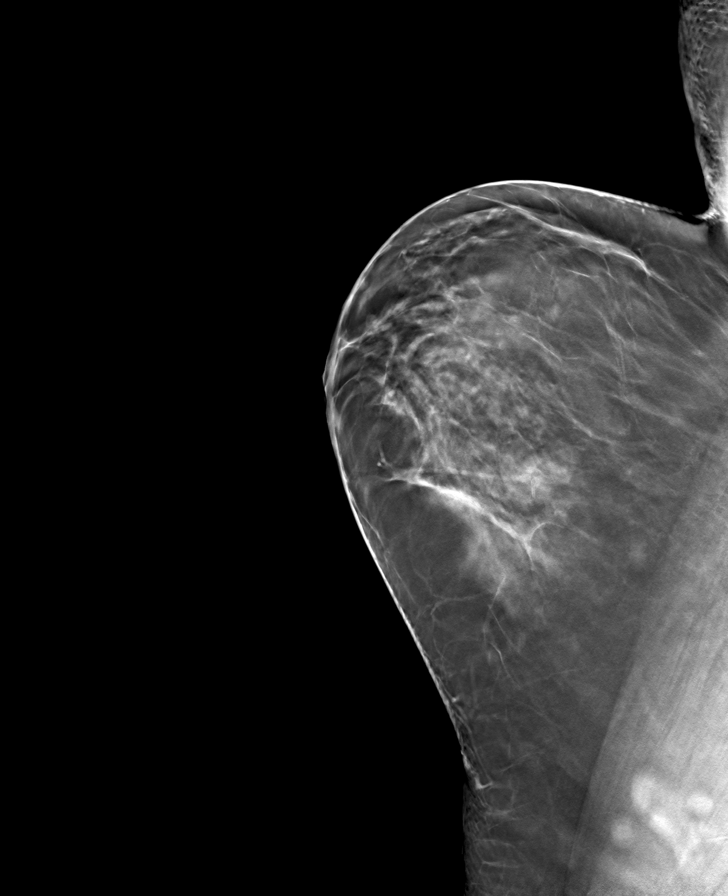

[L CC tomo · tomo slice 39/78.0]
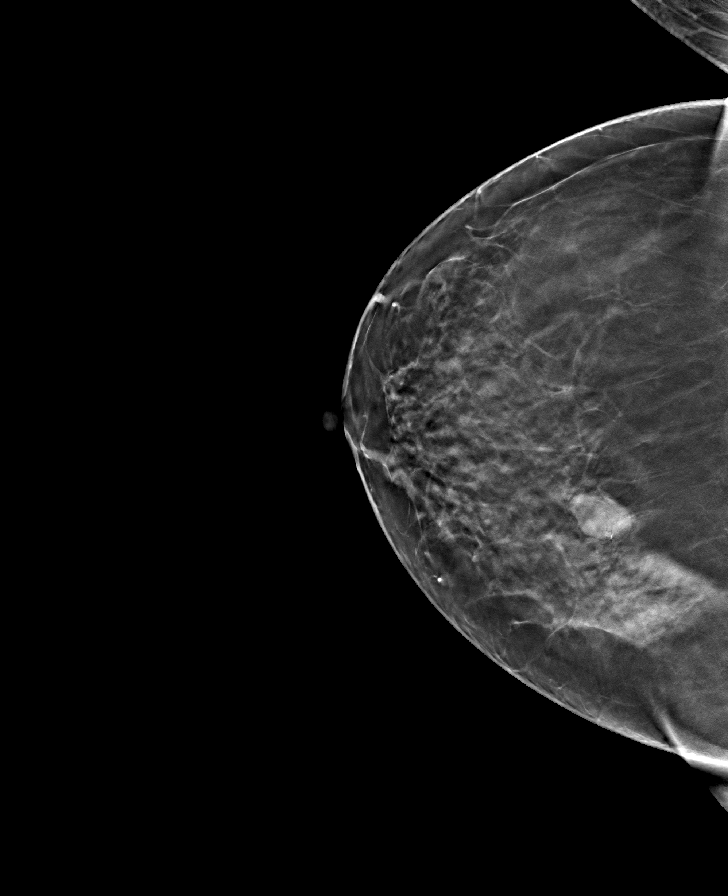

[R MLO tomo · tomo slice 43/84.0]
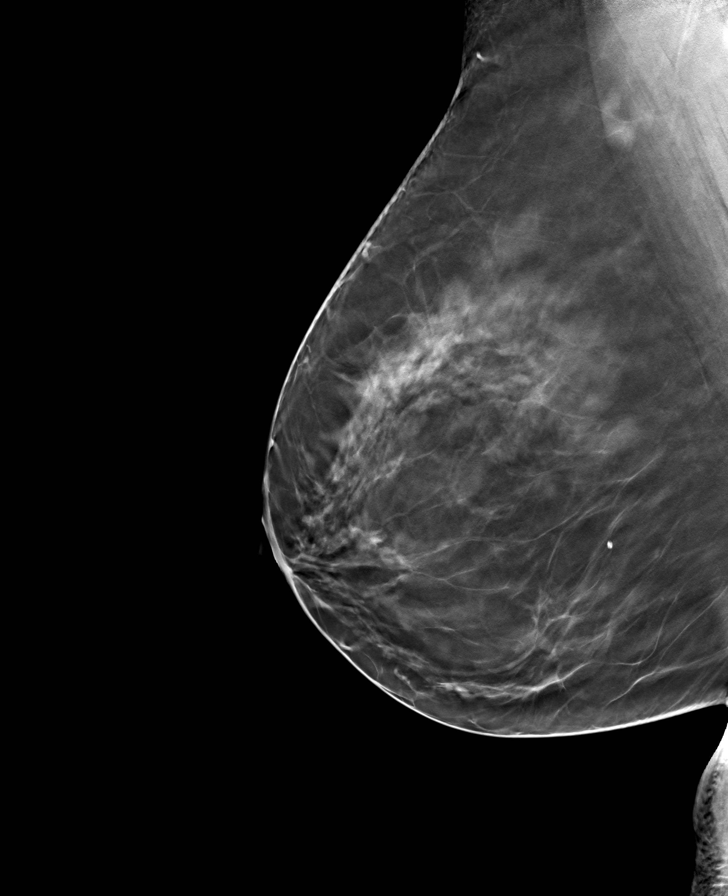

[R CC tomo · tomo slice 40/79.0]
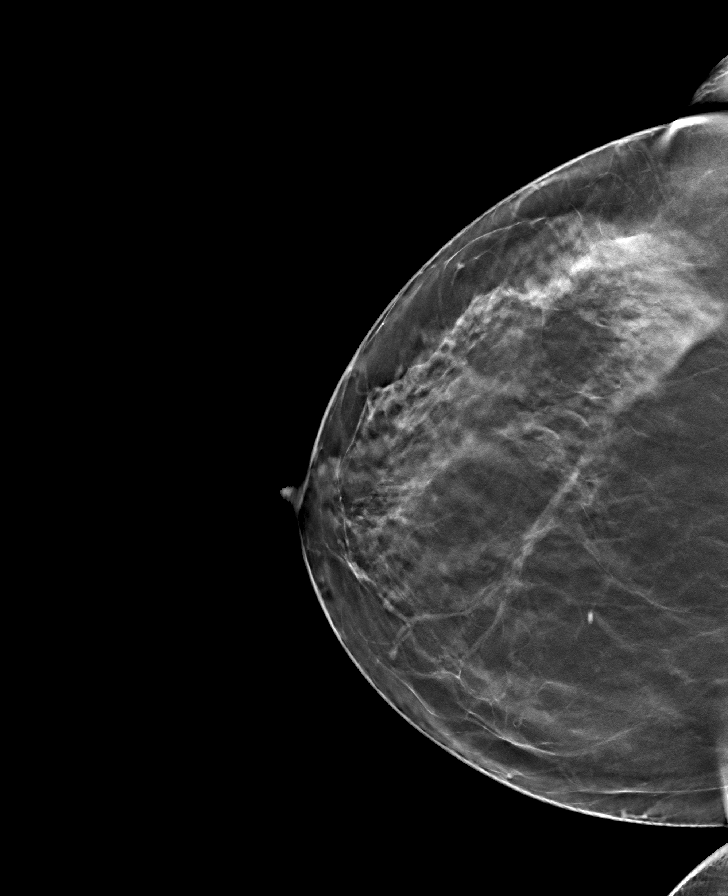

[8 of 24 positions shown; findings below may reference images not displayed]

ACR Breast Density Category c: The breast tissue is heterogeneously
dense, which may obscure small masses.
FINDINGS: There are no findings suspicious for malignancy. Images were
processed with CAD.
IMPRESSION: No mammographic evidence of malignancy. A result letter of this
screening mammogram will be mailed directly to the patient.

RECOMMENDATION:
Screening mammogram in one year. (Code:[5V])

BI-RADS CATEGORY  1: Negative.

## 2019-05-08 ENCOUNTER — Ambulatory Visit: Payer: Managed Care, Other (non HMO)

## 2019-05-08 ENCOUNTER — Ambulatory Visit: Payer: Managed Care, Other (non HMO) | Admitting: Podiatry

## 2019-05-08 ENCOUNTER — Encounter: Payer: Self-pay | Admitting: Podiatry

## 2019-05-08 ENCOUNTER — Other Ambulatory Visit: Payer: Self-pay

## 2019-05-08 VITALS — BP 109/67 | HR 77 | Resp 16

## 2019-05-08 DIAGNOSIS — B070 Plantar wart: Secondary | ICD-10-CM | POA: Diagnosis not present

## 2019-05-08 DIAGNOSIS — M7662 Achilles tendinitis, left leg: Secondary | ICD-10-CM | POA: Diagnosis not present

## 2019-05-08 DIAGNOSIS — M7750 Other enthesopathy of unspecified foot: Secondary | ICD-10-CM

## 2019-05-08 DIAGNOSIS — M76822 Posterior tibial tendinitis, left leg: Secondary | ICD-10-CM | POA: Diagnosis not present

## 2019-05-08 DIAGNOSIS — M7989 Other specified soft tissue disorders: Secondary | ICD-10-CM | POA: Diagnosis not present

## 2019-05-08 MED ORDER — FLUOROURACIL 5 % EX CREA: TOPICAL_CREAM | Freq: Two times a day (BID) | CUTANEOUS | 1 refills | Status: DC

## 2019-05-08 NOTE — Progress Notes (Signed)
Subjective:  Patient ID: Emma Cuevas, female    DOB: 1965/07/11,  MRN: [id number] HPI Chief Complaint  Patient presents with  . Foot Pain    Plantar forefoot left - aching x few months, lesion between 1st and 2nd toes, treating with OTC meds for corns, think causing forefoot pain  . Foot Pain    Posterior heel/medial ankle left - aching x months, AM pain, history of PF, swelling medially   . New Patient (Initial Visit)    53 y.o. female presents with the above complaint.  ROS  Ros: Denies fever chills nausea vomiting muscle aches pains calf pain back pain chest pain shortness of breath.  States that she saw Dr. Nehemiah Massed for the lesion between her first and second toe told her that it was a callus and put an over-the-counter topical on it.  She states that she did this and it helped some but has not gone away all the way.  Past Medical History:  Diagnosis Date  . 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 BRCA negative 2015  . Family history of malignant neoplasm of breast 2013  . Screening for obesity    Past Surgical History:  Procedure Laterality Date  . ABDOMINAL HYSTERECTOMY  2011   partial  . BREAST BIOPSY Left 2013  . BREAST SURGERY Left 10/16/2011   left breast finesse biopsy  . COLONOSCOPY WITH PROPOFOL N/A 12/05/2014   Procedure: COLONOSCOPY WITH PROPOFOL;  Surgeon: Christene Lye, MD;  Location: [facility] ENDOSCOPY;  Service: Endoscopy;  Laterality: N/A;    Current Outpatient Medications:  .  acetaminophen (TYLENOL) 500 MG tablet, Take 500 mg by mouth every 6 (six) hours as needed., Disp: , Rfl:  .  fluorouracil (EFUDEX) 5 % cream, Apply topically 2 (two) times daily., Disp: 40 g, Rfl: 1 .  metroNIDAZOLE (METROGEL) 0.75 % gel, APPLY A SMALL AMOUNT TO AFFECTED AREA AS DIRECTED QD TO BID TO FACE FOR ROSACEA, Disp: , Rfl:  .  Vitamin D, Cholecalciferol, 1000 UNITS CAPS, Take by mouth daily., Disp: , Rfl:   No Known Allergies Review of Systems Objective:     Vitals:   05/08/19 0914  BP: 109/67  Pulse: 77  Resp: 16    General: Well developed, nourished, in no acute distress, alert and oriented x3   Dermatological: Skin is warm, dry and supple bilateral. Nails x 10 are well maintained; remaining integument appears unremarkable at this time. There are no open sores, no preulcerative lesions, no rash or signs of infection present.  A 0.6 cm lesion between the first and second toes of the left foot with a gold dried drainage dorsally and what appears to be several thrombosed capillaries are visible.  Easily believable with mild debridement.  Vascular: Dorsalis Pedis artery and Posterior Tibial artery pedal pulses are 2/4 bilateral with immedate capillary fill time. Pedal hair growth present. No varicosities and no lower extremity edema present bilateral.   Neruologic: Grossly intact via light touch bilateral. Vibratory intact via tuning fork bilateral. Protective threshold with Semmes Wienstein monofilament intact to all pedal sites bilateral. Patellar and Achilles deep tendon reflexes 2+ bilateral. No Babinski or clonus noted bilateral.   Musculoskeletal: No gross boney pedal deformities bilateral. No pain, crepitus, or limitation noted with foot and ankle range of motion bilateral. Muscular strength 5/5 in all groups tested bilateral.  Significant hallux interphalangeus  Gait: Unassisted, Nonantalgic.    Radiographs:  Radiographs taken today do not demonstrate any type of  osseous abnormalities other than a posterior calcaneal heel spur and a plantar calcaneal spur with some soft tissue increase in density at the plantar fascial calcaneal insertion site otherwise margins of the soft tissue appear to be normal as do the osseous structures.  Hallux interphalangeus is noted.  Assessment & Plan:   Assessment: Swelling of the left foot and ankle resulting in some mild posterior tibial tendinitis.  Soft tissue lesion between the first and second  toes of the left foot rule out verruca plantaris versus basal cell carcinoma.  Plan: Discussed etiology pathology conservative versus surgical therapies at this point compression to the ankle should help alleviate and eliminate the soft tissue swelling and the tenderness in the rear foot.  I provided her with an anklet compression device today discussed different types with her.  I debrided the lesion today which was readily believable we will try Efudex cream initially and I will follow-up with her in 6 weeks she will apply this twice daily if this resolves the problem then no follow-up is necessary otherwise I will see her in 6 weeks at which time we will perform a punch biopsy     Airi Copado T. [place], [place]

## 2019-06-07 ENCOUNTER — Ambulatory Visit: Payer: Managed Care, Other (non HMO) | Admitting: Podiatry

## 2019-06-07 ENCOUNTER — Other Ambulatory Visit: Payer: Self-pay

## 2019-06-07 ENCOUNTER — Encounter: Payer: Self-pay | Admitting: Podiatry

## 2019-06-07 DIAGNOSIS — M722 Plantar fascial fibromatosis: Secondary | ICD-10-CM | POA: Diagnosis not present

## 2019-06-07 DIAGNOSIS — B070 Plantar wart: Secondary | ICD-10-CM

## 2019-06-07 NOTE — Patient Instructions (Signed)

## 2019-06-07 NOTE — Progress Notes (Signed)
She presents today stating that the left posterior tibial tendon is doing much better it was hurting around Christmas and until last week he was doing pretty well there is been very painful to walk on.  She states that she has been utilizing the Efudex for the wart which has almost resolved.  She states that she is now having pain beneath the area she points to the plantar fascial area of the left foot.  Objective: Vital signs are stable she is alert and oriented x3.  She has almost 100% clearance of the wart is a small lesion there between the first and second toes on the plantar surface that measures about 2 to 3 mm in diameter and appears that it is almost ready to peel off.  She has pain on palpation medial calcaneal tubercle of the left heel but no pain on palpation of the posterior tibial tendon though there is still some swelling overlying the area.  Assessment: Plantar fasciitis resolving posterior tibial tendinitis resolving wart left foot.  Plan: Continue with the Efudex cream until the wart is completely resolved.  Injected the plantar fascia at the plantar fascial cannula insertion site left.  She tolerated procedure well without complication we discussed appropriate shoe gear stretching exercises and ice therapy follow-up with her in the near future.

## 2019-06-08 ENCOUNTER — Ambulatory Visit: Payer: Managed Care, Other (non HMO) | Admitting: Podiatry

## 2019-06-21 ENCOUNTER — Ambulatory Visit: Payer: Managed Care, Other (non HMO) | Admitting: Podiatry

## 2019-07-10 ENCOUNTER — Other Ambulatory Visit: Payer: Self-pay

## 2019-07-10 ENCOUNTER — Encounter: Payer: Self-pay | Admitting: Podiatry

## 2019-07-10 ENCOUNTER — Ambulatory Visit: Payer: Managed Care, Other (non HMO) | Admitting: Podiatry

## 2019-07-10 DIAGNOSIS — M722 Plantar fascial fibromatosis: Secondary | ICD-10-CM | POA: Diagnosis not present

## 2019-07-10 DIAGNOSIS — B070 Plantar wart: Secondary | ICD-10-CM

## 2019-07-10 NOTE — Progress Notes (Signed)
She presents today for follow-up of her plantar fasciitis and posterior tibial tendinitis of her left foot and ankle.  She is also following up for wart between her first and second toes of her left foot.  She states that she has been continuing to apply the Efudex cream the wart has since gone and all of the pain to the foot has gone along as well.  Continues to wear plantar fascial brace regularly.  Has a pair of new shoes.  Objective: Vital signs are stable alert oriented x3.  Pulses are palpable.  There is no erythema edema cellulitis drainage odor no signs of a wart at all.  She has no pain on palpation of the posterior tibial tendon and she has no pain on palpation of the plantar fascia.  Assessment: Well-healing verrucoid lesion.  Well-healing posterior tibial tendon.  Well-healing plan fasciitis all left foot.  Plan: At this point went ahead and recommended that she continue to wear the brace for about another month make sure that she stays in good new shoes all the time and I will follow-up with her on an as-needed basis.

## 2020-01-17 ENCOUNTER — Other Ambulatory Visit: Payer: Self-pay | Admitting: Family Medicine

## 2020-01-17 DIAGNOSIS — Z1231 Encounter for screening mammogram for malignant neoplasm of breast: Secondary | ICD-10-CM

## 2020-02-13 ENCOUNTER — Other Ambulatory Visit: Payer: Self-pay

## 2020-02-13 ENCOUNTER — Ambulatory Visit
Admission: RE | Admit: 2020-02-13 | Discharge: 2020-02-13 | Disposition: A | Discharge status: Home / Self Care | Payer: Managed Care, Other (non HMO) | Source: Ambulatory Visit | Attending: Family Medicine | Admitting: Family Medicine

## 2020-02-13 DIAGNOSIS — Z1231 Encounter for screening mammogram for malignant neoplasm of breast: Secondary | ICD-10-CM | POA: Insufficient documentation

## 2020-02-13 IMAGING — MG DIGITAL SCREENING BILAT W/ TOMO W/ CAD
8 series · 8 of 24 positions shown · non-contrast
Comparison: Previous exam(s).

CLINICAL DATA: Screening.

EXAM:
DIGITAL SCREENING BILATERAL MAMMOGRAM WITH TOMO AND CAD

[R CC synth-2D]
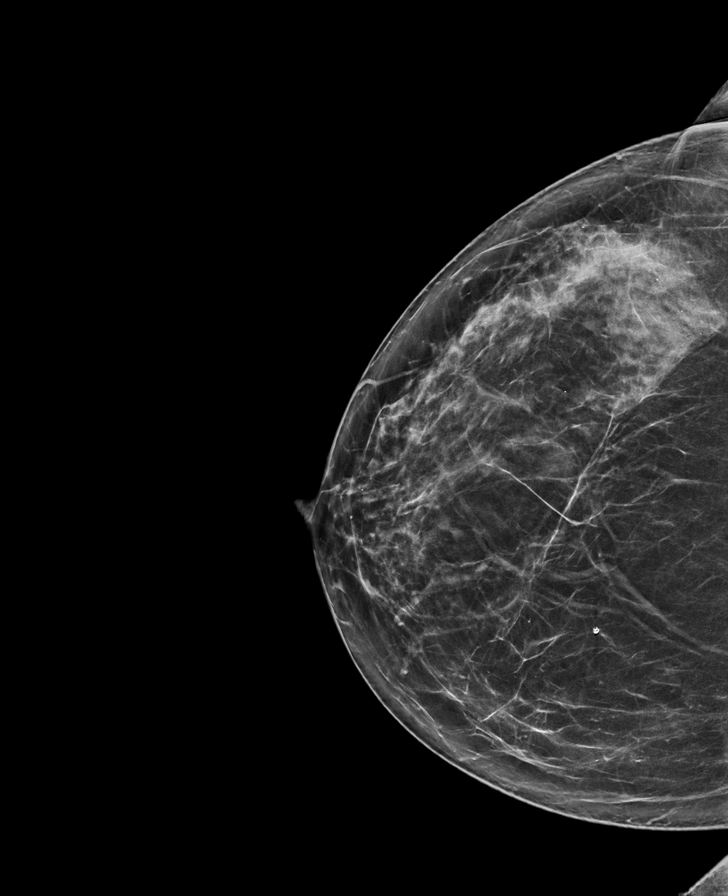

[R MLO synth-2D]
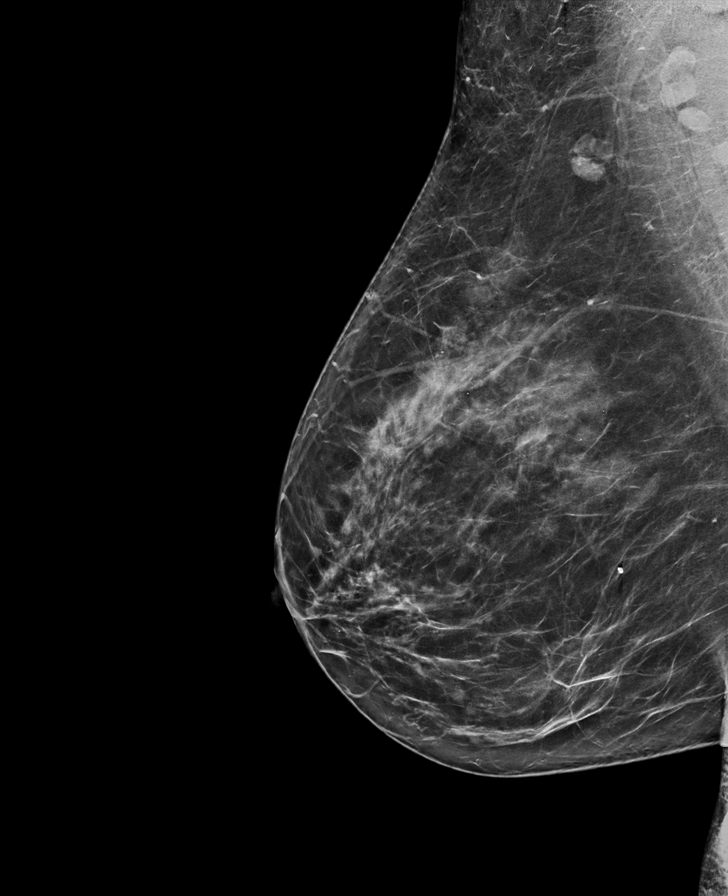

[L MLO synth-2D]
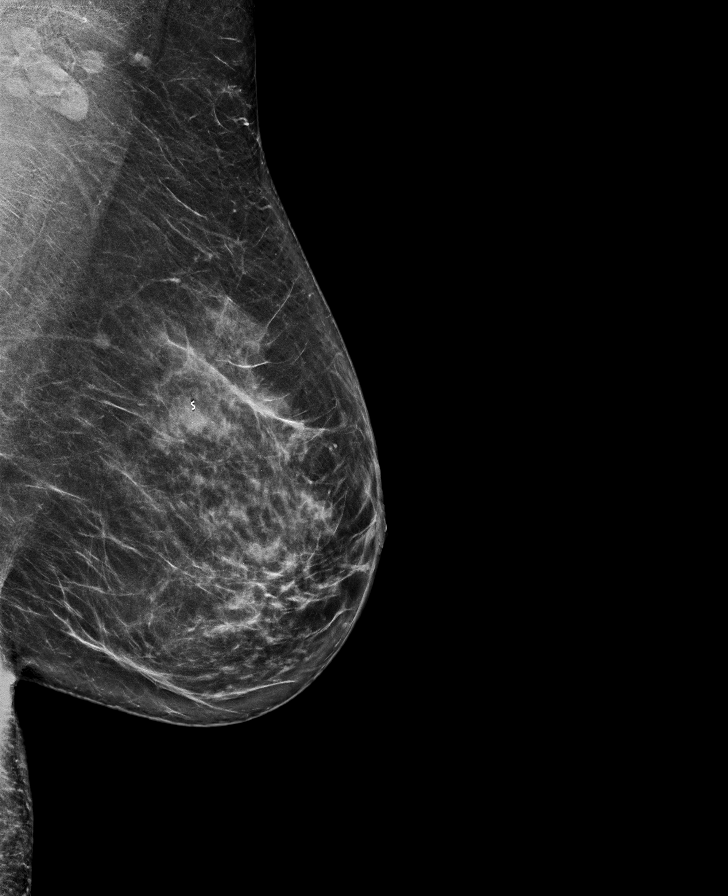

[L CC synth-2D]
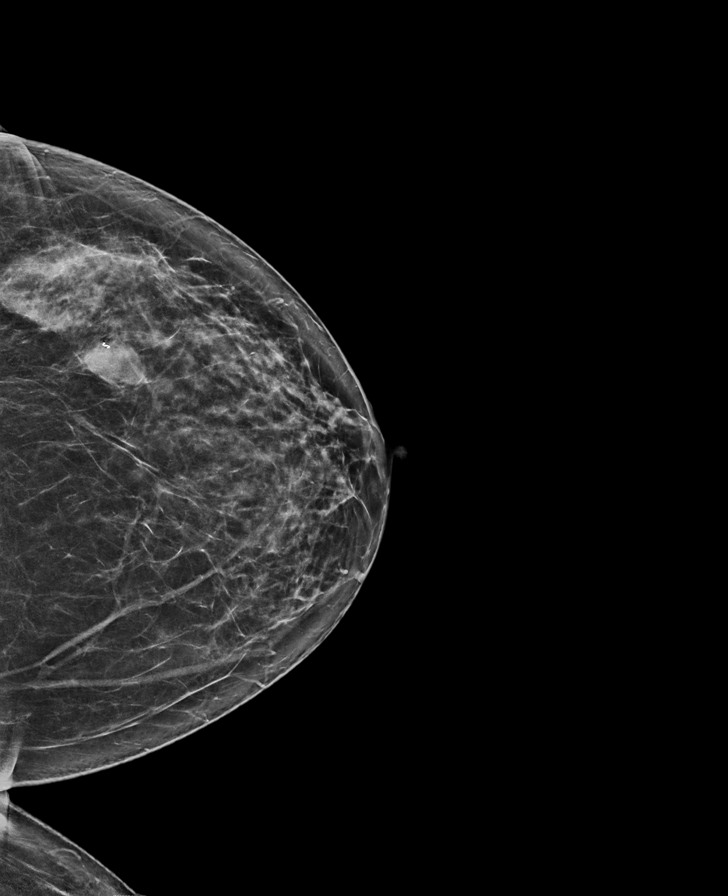

[L MLO tomo · tomo slice 41/81.0]
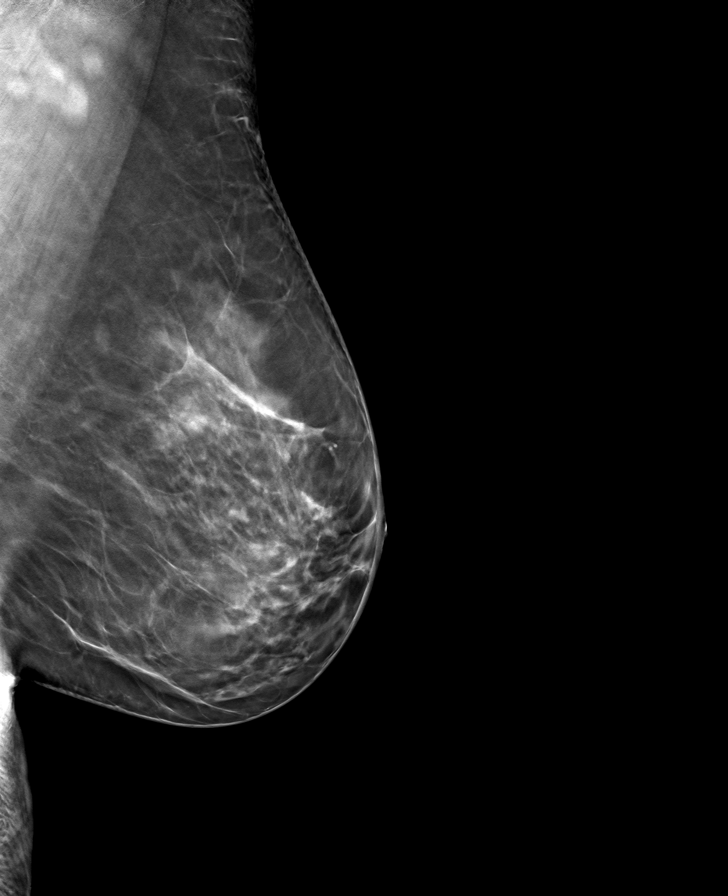

[R MLO tomo · tomo slice 40/79.0]
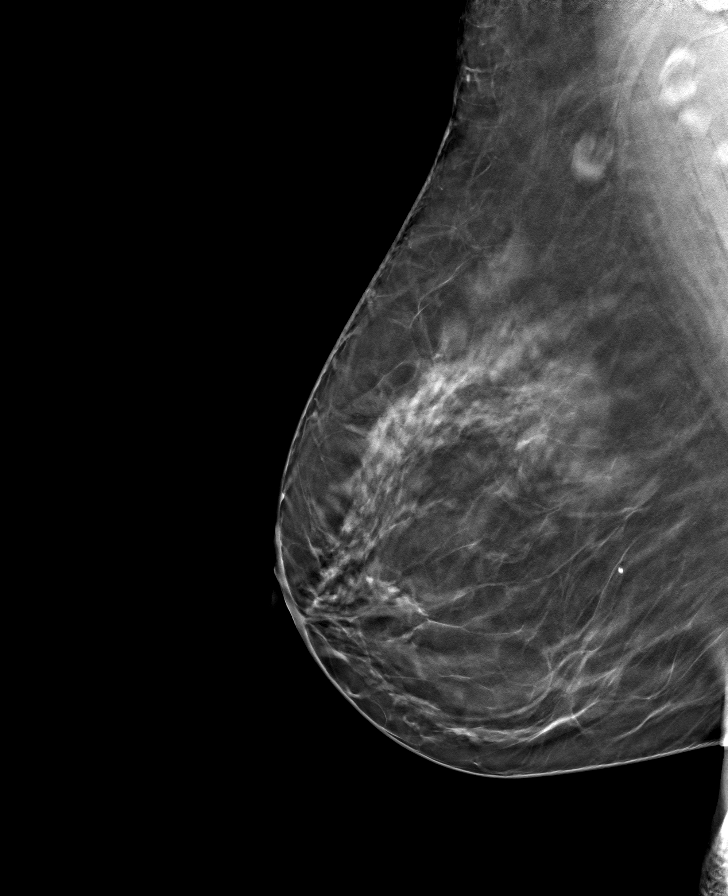

[L CC tomo · tomo slice 35/70.0]
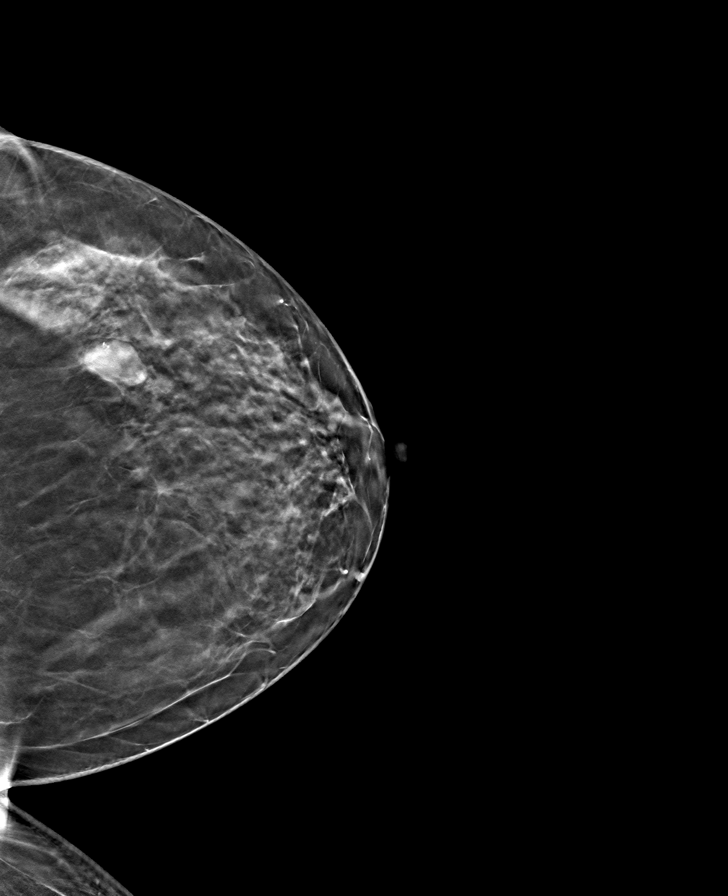

[R CC tomo · tomo slice 37/74.0]
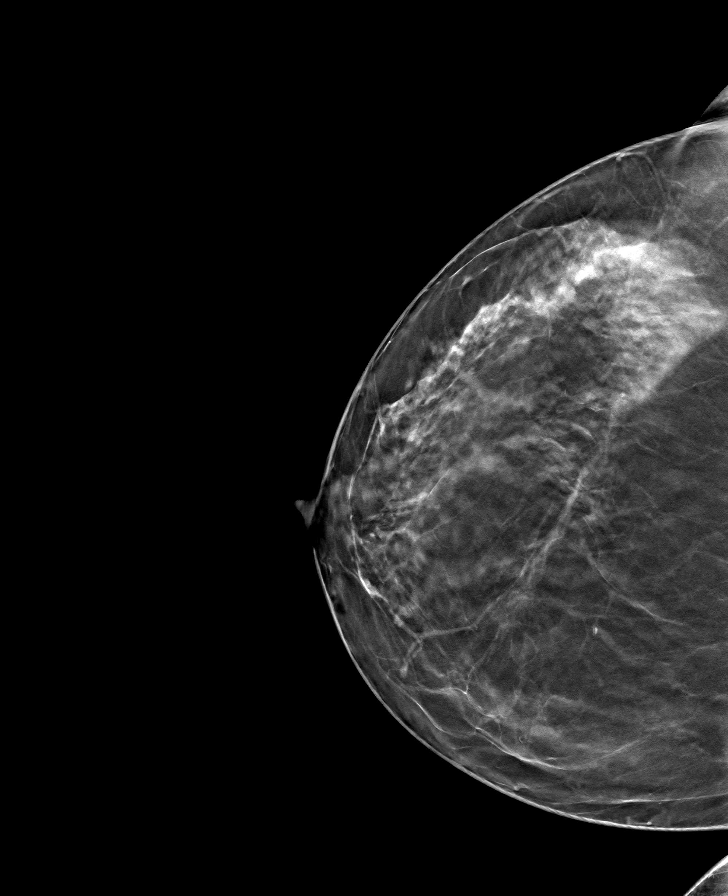

[8 of 24 positions shown; findings below may reference images not displayed]

ACR Breast Density Category c: The breast tissue is heterogeneously
dense, which may obscure small masses.
FINDINGS: There are no findings suspicious for malignancy. Images were
processed with CAD.
IMPRESSION: No mammographic evidence of malignancy. A result letter of this
screening mammogram will be mailed directly to the patient.

RECOMMENDATION:
Screening mammogram in one year. (Code:[5V])

BI-RADS CATEGORY  1: Negative.

## 2020-04-15 ENCOUNTER — Other Ambulatory Visit: Payer: Self-pay

## 2020-04-15 ENCOUNTER — Ambulatory Visit (INDEPENDENT_AMBULATORY_CARE_PROVIDER_SITE_OTHER): Payer: Managed Care, Other (non HMO) | Admitting: Dermatology

## 2020-04-15 DIAGNOSIS — L719 Rosacea, unspecified: Secondary | ICD-10-CM | POA: Diagnosis not present

## 2020-04-15 DIAGNOSIS — L814 Other melanin hyperpigmentation: Secondary | ICD-10-CM

## 2020-04-15 DIAGNOSIS — L743 Miliaria, unspecified: Secondary | ICD-10-CM

## 2020-04-15 DIAGNOSIS — Z1283 Encounter for screening for malignant neoplasm of skin: Secondary | ICD-10-CM

## 2020-04-15 DIAGNOSIS — I8393 Asymptomatic varicose veins of bilateral lower extremities: Secondary | ICD-10-CM

## 2020-04-15 DIAGNOSIS — L578 Other skin changes due to chronic exposure to nonionizing radiation: Secondary | ICD-10-CM

## 2020-04-15 DIAGNOSIS — D229 Melanocytic nevi, unspecified: Secondary | ICD-10-CM

## 2020-04-15 DIAGNOSIS — D1800 Hemangioma unspecified site: Secondary | ICD-10-CM

## 2020-04-15 DIAGNOSIS — L821 Other seborrheic keratosis: Secondary | ICD-10-CM

## 2020-04-15 NOTE — Progress Notes (Signed)
   Follow-Up Visit   Subjective  Emma Cuevas is a 54 y.o. female who presents for the following: Annual Exam (No history of abnormal moles or skin cancers - TBSE today) and Other (Spots of left wrist. She thinks it may have come from her watch). The patient presents for Total-Body Skin Exam (TBSE) for skin cancer screening and mole check.  The following portions of the chart were reviewed this encounter and updated as appropriate:  Tobacco  Allergies  Meds  Problems  Med Hx  Surg Hx  Fam Hx     Review of Systems:  No other skin or systemic complaints except as noted in HPI or Assessment and Plan.  Objective  Well appearing patient in no apparent distress; mood and affect are within normal limits.  A full examination was performed including scalp, head, eyes, ears, nose, lips, neck, chest, axillae, abdomen, back, buttocks, bilateral upper extremities, bilateral lower extremities, hands, feet, fingers, toes, fingernails, and toenails. All findings within normal limits unless otherwise noted below.  Objective  Head - Anterior (Face): Papule of right face  Objective  Left Lower Leg - Anterior: Spider veins   Assessment & Plan    Lentigines - Scattered tan macules - Discussed due to sun exposure - Benign, observe - Call for any changes  Seborrheic Keratoses - Stuck-on, waxy, tan-brown papules and plaques  - Discussed benign etiology and prognosis. - Observe - Call for any changes  Melanocytic Nevi - Tan-brown and/or pink-flesh-colored symmetric macules and papules - Benign appearing on exam today - Observation - Call clinic for new or changing moles - Recommend daily use of broad spectrum spf 30+ sunscreen to sun-exposed areas.   Hemangiomas - Red papules - Discussed benign nature - Observe - Call for any changes  Actinic Damage - Chronic, secondary to cumulative UV/sun exposure - diffuse scaly erythematous macules with underlying dyspigmentation -  Recommend daily broad spectrum sunscreen SPF 30+ to sun-exposed areas, reapply every 2 hours as needed.  - Call for new or changing lesions.  Skin cancer screening performed today.  Miliaria Left Wrist - Posterior  Vs Folliculitis  Benign appearing. No treatment today.   Rosacea Head - Anterior (Face)  Rosacea is a chronic progressive skin condition usually affecting the face of adults. It is treatable but not curable. It sometimes affects the eyes (ocular rosacea) as well. It may respond to topical and/or systemic medication and can flare with stress, sun exposure, alcohol, exercise and some foods.  Start [facility] metronidazole/ivermectin/azelaic acid twice daily as needed to affected areas on the face. The patient was advised this is not covered by insurance. They will receive an email to check out and the medication will be mailed to their home.     Spider veins of both lower extremities Left Lower Leg - Anterior  Discussed sclerotherapy. Advised patient fee is $350/treatment. Patient may consider.  Return in about 1 year (around 04/15/2021).   I, Ashok Cordia, CMA, am acting as scribe for Sarina Ser, MD .  Documentation: I have reviewed the above documentation for accuracy and completeness, and I agree with the above.  Sarina Ser, MD

## 2020-04-15 NOTE — Patient Instructions (Signed)
Instructions for [facility] Medications  One or more of your medications was sent to the [facility] mail order compounding pharmacy. You will receive an email from them and can purchase the medicine through that link. It will then be mailed to your home at the address you confirmed. If for any reason you do not receive an email from them, please check your spam folder. If you still do not find the email, please let us know. [facility] phone number is [phone].   

## 2020-04-23 ENCOUNTER — Encounter: Payer: Self-pay | Admitting: Dermatology

## 2021-02-04 ENCOUNTER — Other Ambulatory Visit: Payer: Self-pay | Admitting: Family Medicine

## 2021-02-04 DIAGNOSIS — Z1231 Encounter for screening mammogram for malignant neoplasm of breast: Secondary | ICD-10-CM

## 2021-02-18 ENCOUNTER — Other Ambulatory Visit: Payer: Self-pay

## 2021-02-18 ENCOUNTER — Ambulatory Visit
Admission: RE | Admit: 2021-02-18 | Discharge: 2021-02-18 | Disposition: A | Discharge status: Home / Self Care | Payer: Managed Care, Other (non HMO) | Source: Ambulatory Visit | Attending: Family Medicine | Admitting: Family Medicine

## 2021-02-18 DIAGNOSIS — Z1231 Encounter for screening mammogram for malignant neoplasm of breast: Secondary | ICD-10-CM | POA: Insufficient documentation

## 2021-02-18 IMAGING — MG MM DIGITAL SCREENING BILAT W/ TOMO AND CAD
8 series · 8 of 24 positions shown · non-contrast
Comparison: Previous exam(s).

CLINICAL DATA: Screening.

EXAM:
DIGITAL SCREENING BILATERAL MAMMOGRAM WITH TOMOSYNTHESIS AND CAD
TECHNIQUE: Bilateral screening digital craniocaudal and mediolateral oblique
mammograms were obtained. Bilateral screening digital breast
tomosynthesis was performed. The images were evaluated with
computer-aided detection.

[R CC synth-2D]
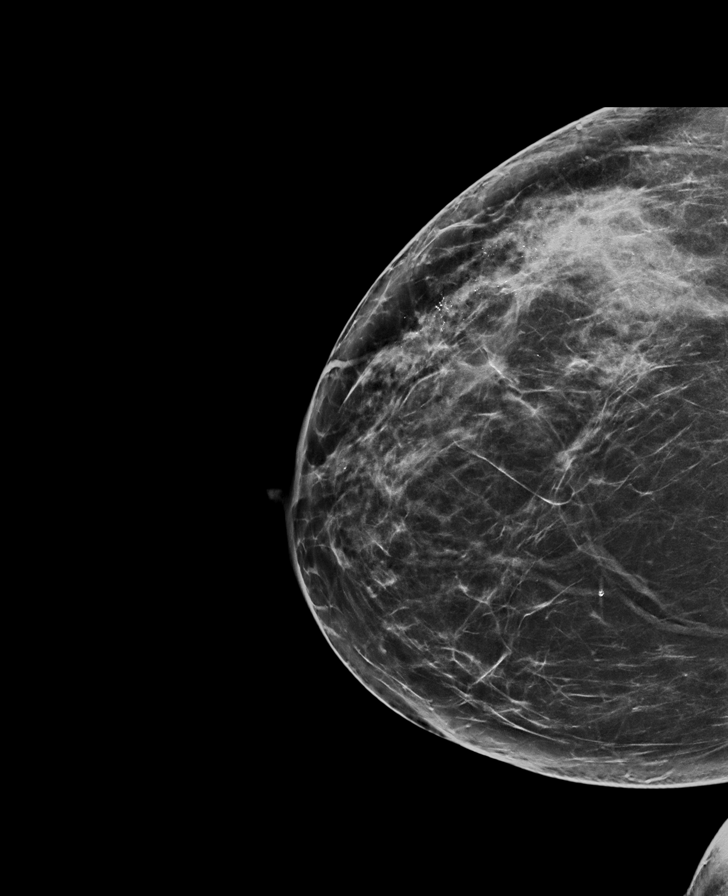

[R MLO synth-2D]
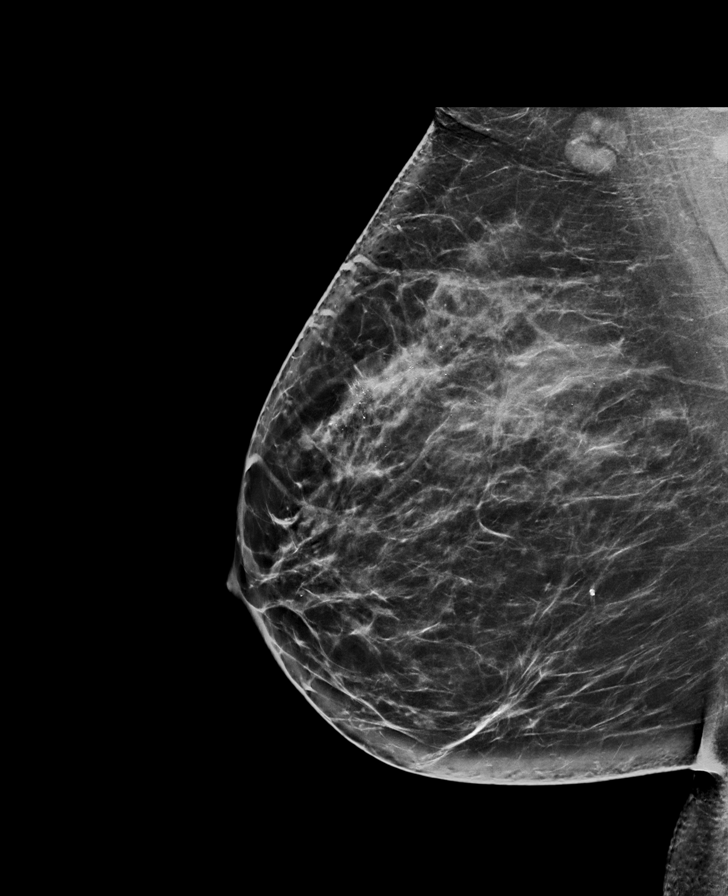

[L CC synth-2D]
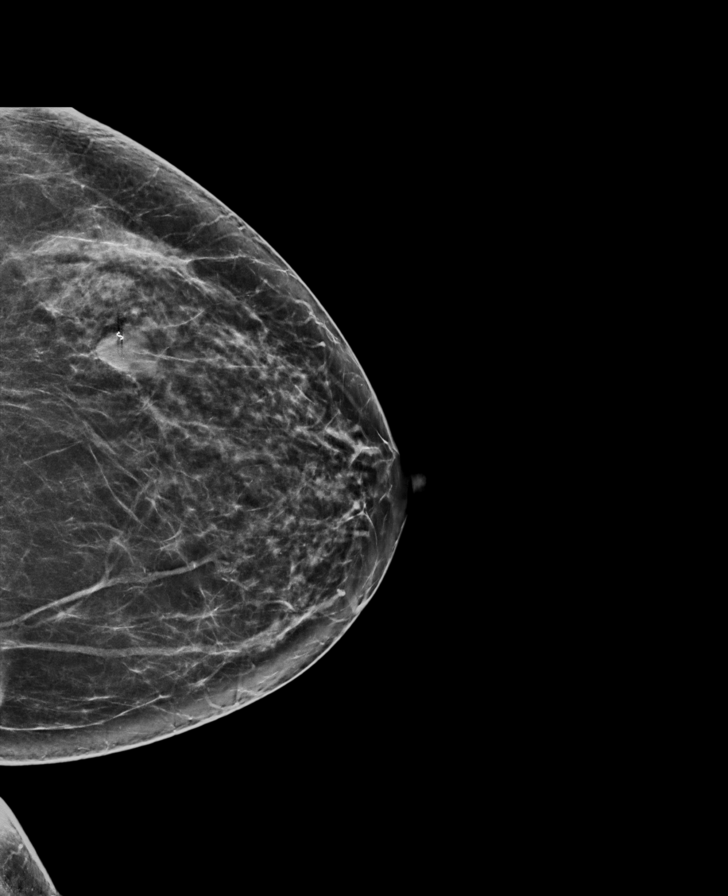

[L MLO synth-2D]
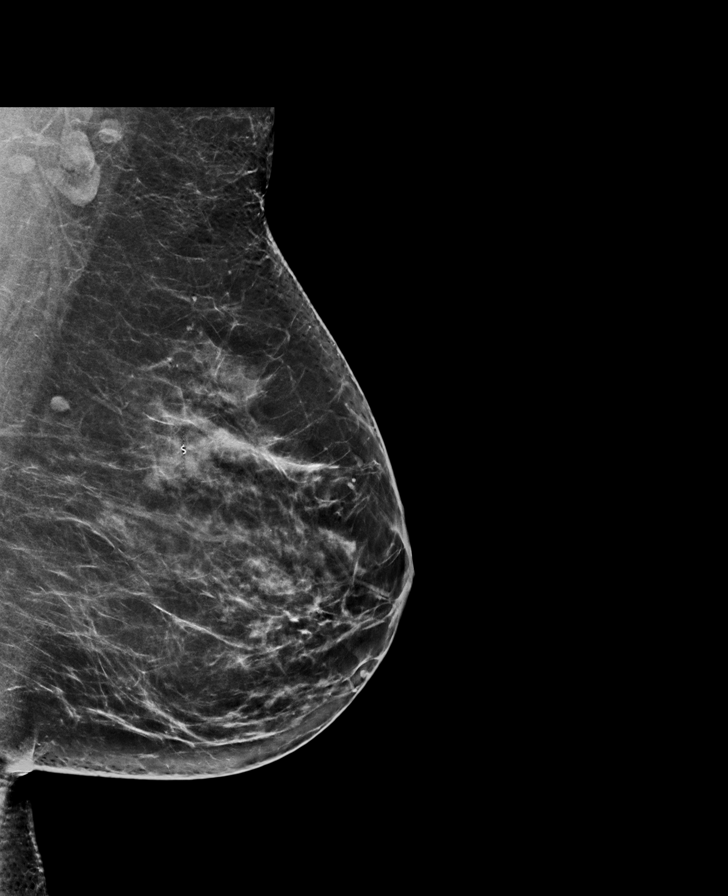

[L MLO tomo · tomo slice 41/81.0]
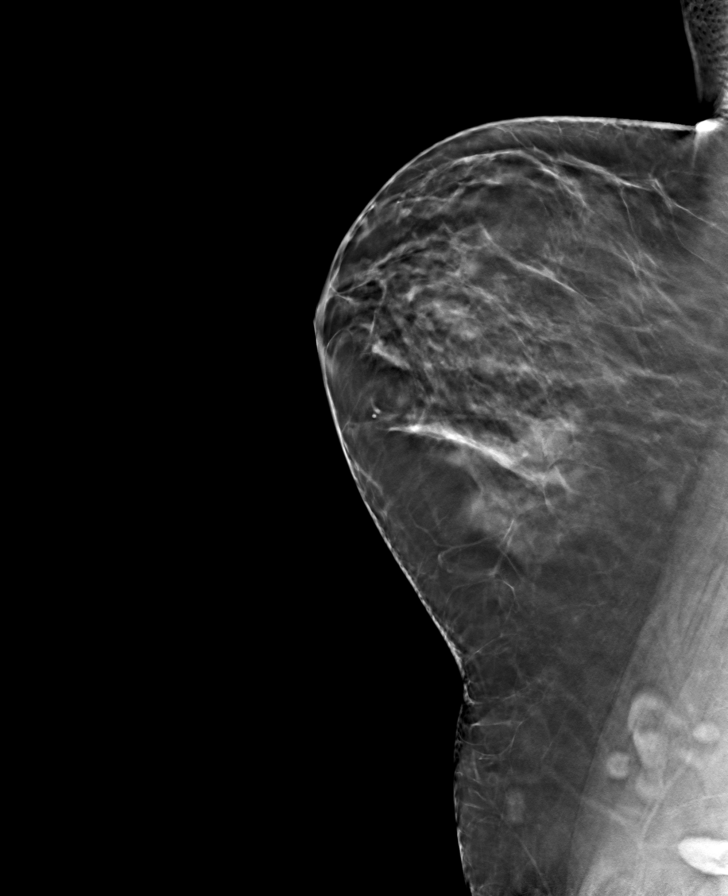

[L CC tomo · tomo slice 38/75.0]
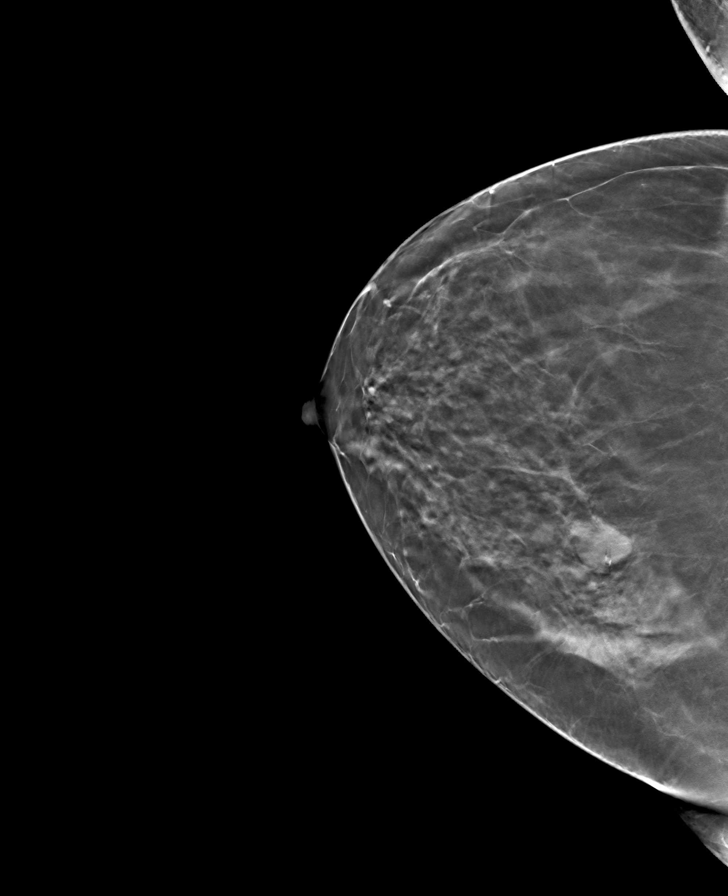

[R MLO tomo · tomo slice 43/86.0]
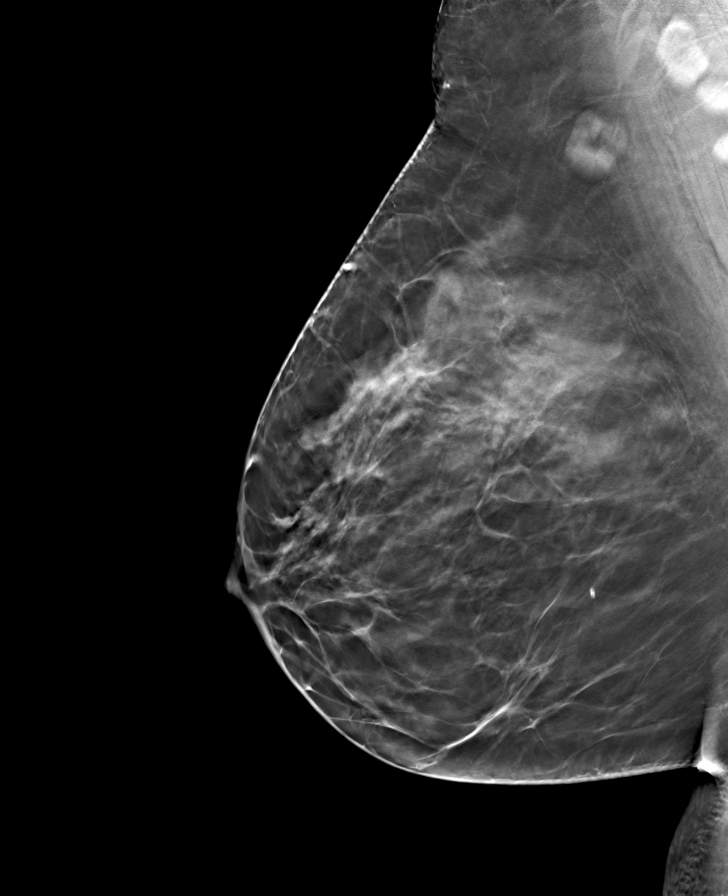

[R CC tomo · tomo slice 43/84.0]
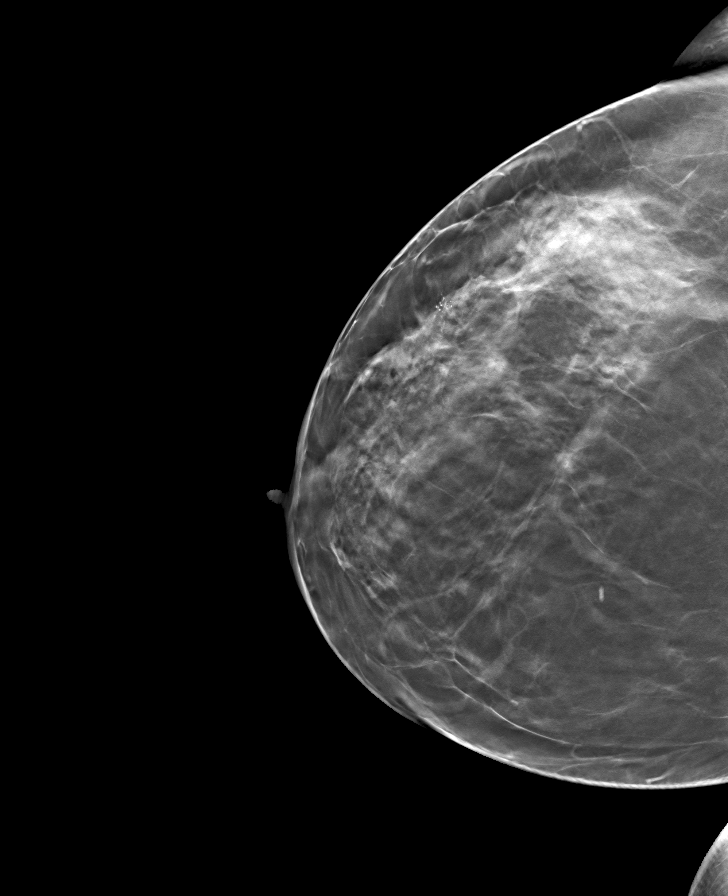

[8 of 24 positions shown; findings below may reference images not displayed]

ACR Breast Density Category c: The breast tissue is heterogeneously
dense, which may obscure small masses.
FINDINGS: In the right breast, calcifications warrant further evaluation with
magnified views. Additionally there is a prominent right axillary
lymph node which warrants further evaluation. In the left breast, no
findings suspicious for malignancy.
IMPRESSION: Further evaluation is suggested for calcifications in the right
breast and for prominent right axillary lymph node.

RECOMMENDATION:
Diagnostic mammogram of the right breast and ultrasound of the right
axilla.

The patient will be contacted regarding the findings, and additional
imaging will be scheduled.

BI-RADS CATEGORY  0: Incomplete. Need additional imaging evaluation
and/or prior mammograms for comparison.

## 2021-02-26 ENCOUNTER — Other Ambulatory Visit: Payer: Self-pay | Admitting: Family Medicine

## 2021-02-26 DIAGNOSIS — R928 Other abnormal and inconclusive findings on diagnostic imaging of breast: Secondary | ICD-10-CM

## 2021-02-26 DIAGNOSIS — R921 Mammographic calcification found on diagnostic imaging of breast: Secondary | ICD-10-CM

## 2021-03-04 ENCOUNTER — Ambulatory Visit
Admission: RE | Admit: 2021-03-04 | Discharge: 2021-03-04 | Disposition: A | Discharge status: Home / Self Care | Payer: Managed Care, Other (non HMO) | Source: Ambulatory Visit | Attending: Family Medicine | Admitting: Family Medicine

## 2021-03-04 ENCOUNTER — Other Ambulatory Visit: Payer: Self-pay | Admitting: Family Medicine

## 2021-03-04 ENCOUNTER — Other Ambulatory Visit: Payer: Self-pay

## 2021-03-04 DIAGNOSIS — R928 Other abnormal and inconclusive findings on diagnostic imaging of breast: Secondary | ICD-10-CM | POA: Diagnosis not present

## 2021-03-04 DIAGNOSIS — R921 Mammographic calcification found on diagnostic imaging of breast: Secondary | ICD-10-CM

## 2021-03-04 IMAGING — MG MM DIGITAL DIAGNOSTIC UNILAT*R* W/ TOMO W/ CAD
6 series · 6 of 14 positions shown · non-contrast
Comparison: Previous exam(s).

CLINICAL DATA: 55-year-old female presenting as a recall from
screening for right breast calcifications and a right axillary lymph
node.

EXAM:
DIGITAL DIAGNOSTIC UNILATERAL RIGHT MAMMOGRAM WITH TOMOSYNTHESIS AND
CAD; US AXILLARY RIGHT
TECHNIQUE: Right digital diagnostic mammography and breast tomosynthesis was
performed. The images were evaluated with computer-aided detection.;
Targeted ultrasound examination of the right axilla was performed.

[R CC]
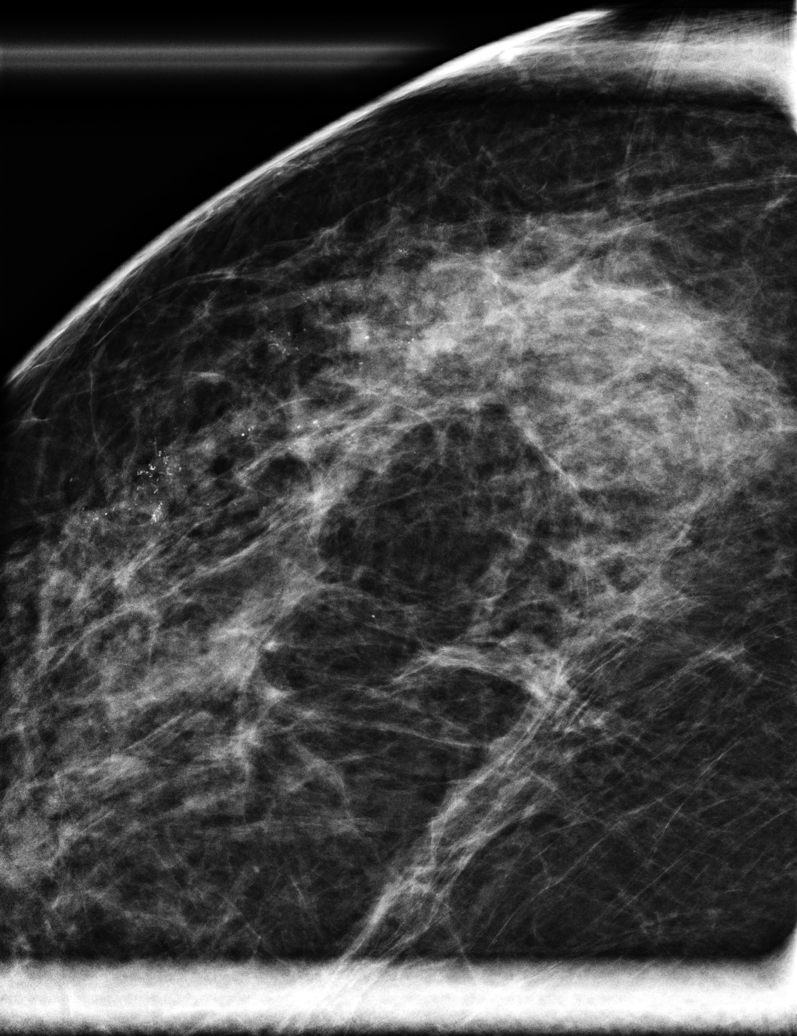

[R ML]
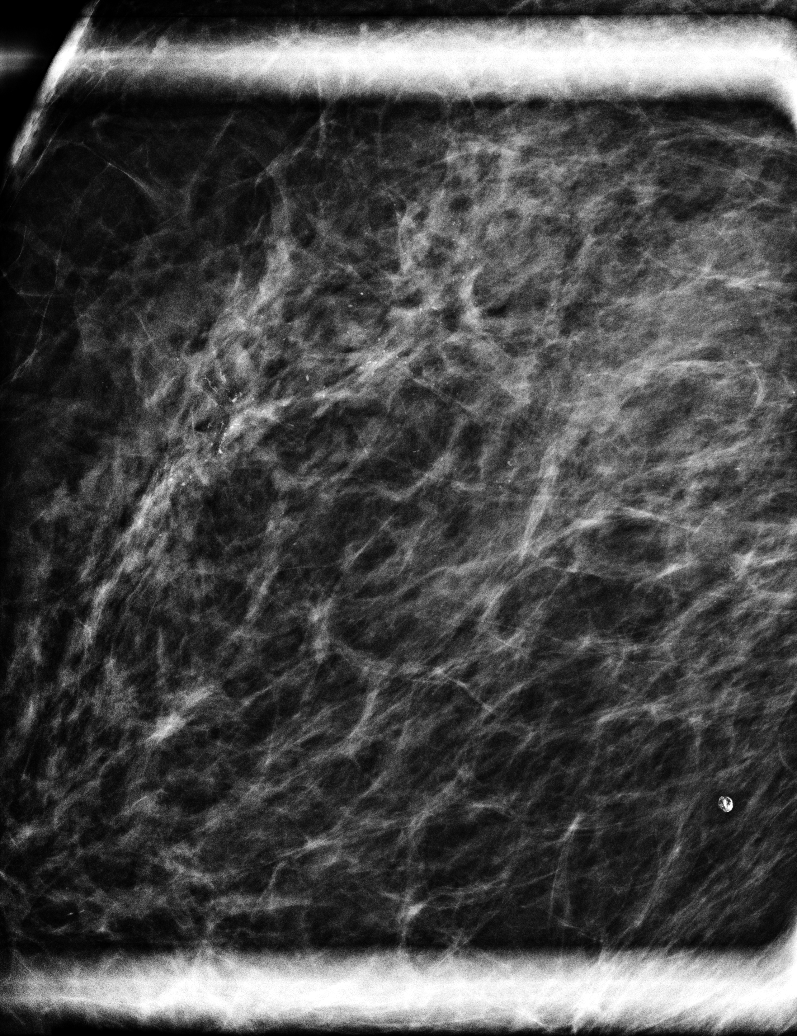

[R MLO synth-2D]
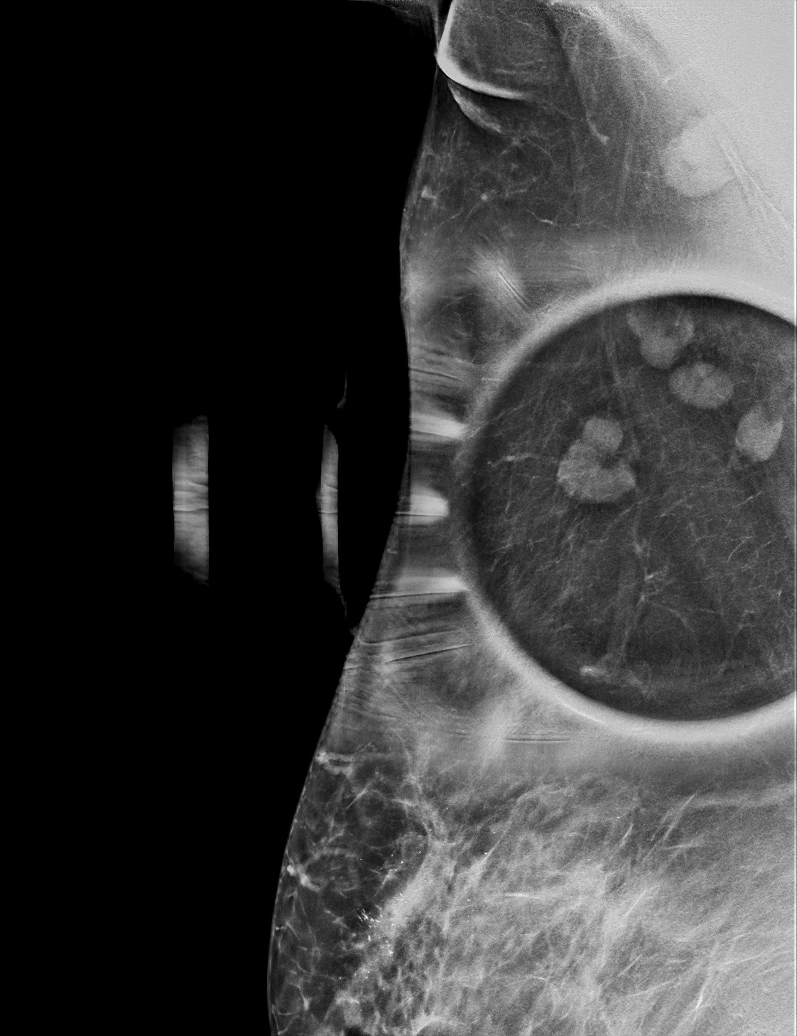

[R ML synth-2D]
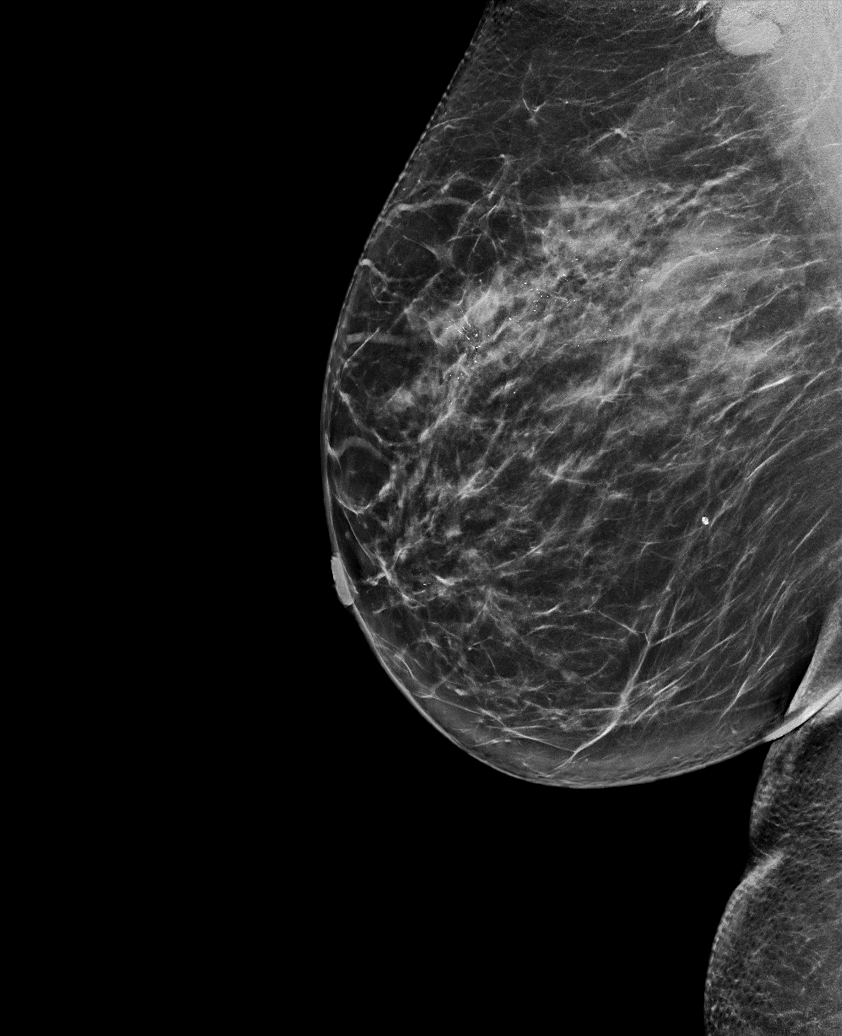

[R ML tomo · tomo slice 45/90.0]
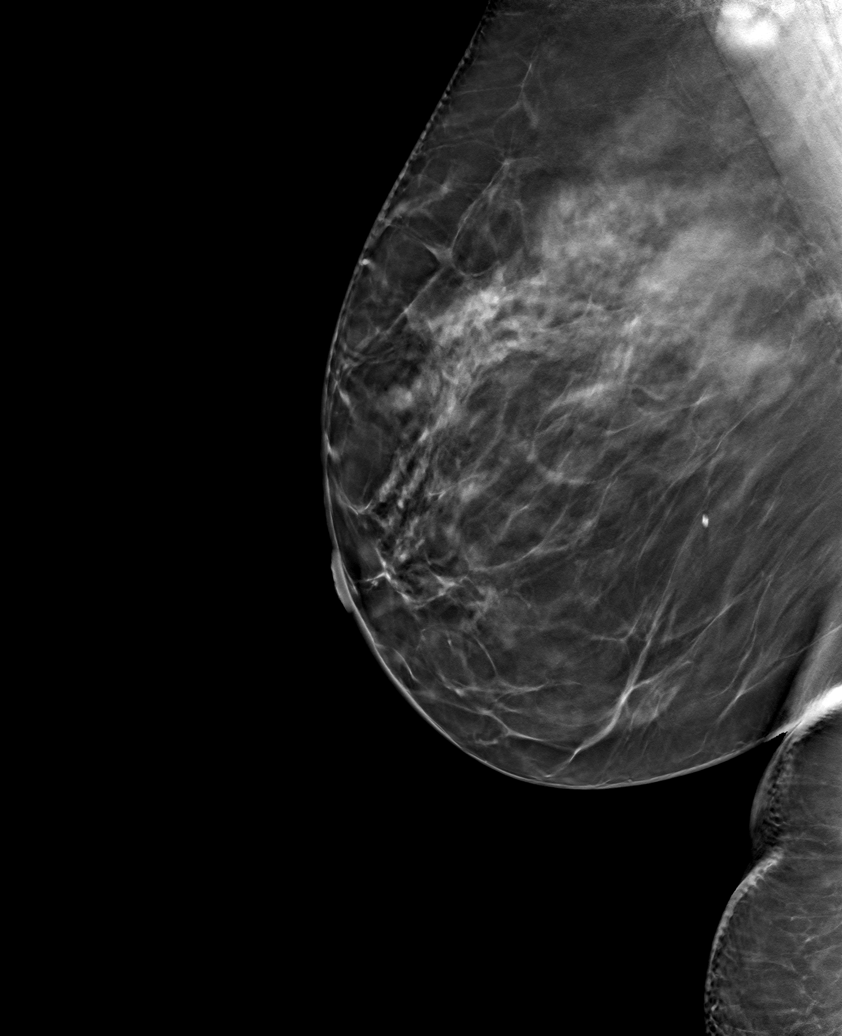

[R MLO tomo · tomo slice 44/87.0]
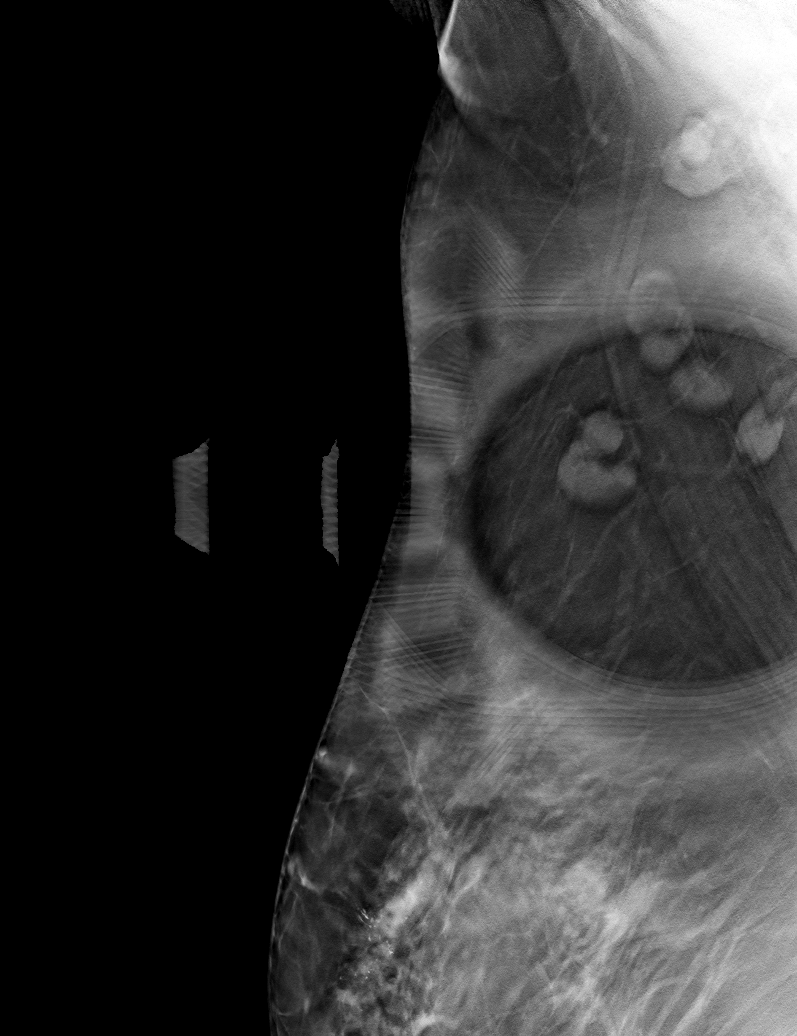

[6 of 14 positions shown; findings below may reference images not displayed]

ACR Breast Density Category c: The breast tissue is heterogeneously
dense, which may obscure small masses.
FINDINGS: Mammogram:

Spot 2D magnification views of the right breast were performed
demonstrating persistence of extensive pleomorphic calcifications
throughout the upper-outer quadrant of the right breast spanning at
least 6.8 cm. There is a possible distortion associated with the
calcifications on the full field mL view (image 67/90).

Spot compression tomosynthesis views of the right axilla were
performed demonstrating persistence of at least 1 lymph node with
cortical thickening.

Ultrasound:

Targeted ultrasound performed in the right axilla demonstrating 2
abnormal lymph nodes with cortical thickness measuring up to 0.7 cm.
IMPRESSION: 1. Extensive suspicious pleomorphic calcifications involving the
upper outer right breast measuring up to 6.8 cm. There is a possible
distortion associated with the calcifications.

2.  Suspicious right axillary lymph nodes (2).

RECOMMENDATION:
1. Stereotactic core needle biopsy x2 targeting the anterior and
posterior aspect of the calcifications. Recommend targeting the
possible area of distortion with calcifications at the anterior
aspect.

2. Ultrasound-guided core needle biopsy of a right axillary lymph
node.

I have discussed the findings and recommendations with the patient
who agrees to proceed with biopsy. The patient will be scheduled for
the biopsy appointment prior to leaving the office today.

BI-RADS CATEGORY  4: Suspicious.

## 2021-03-04 IMAGING — US US AXILLARY RIGHT
1 series · 3 of 3 positions shown · non-contrast
Comparison: Previous exam(s).

CLINICAL DATA: 55-year-old female presenting as a recall from
screening for right breast calcifications and a right axillary lymph
node.

EXAM:
DIGITAL DIAGNOSTIC UNILATERAL RIGHT MAMMOGRAM WITH TOMOSYNTHESIS AND
CAD; US AXILLARY RIGHT
TECHNIQUE: Right digital diagnostic mammography and breast tomosynthesis was
performed. The images were evaluated with computer-aided detection.;
Targeted ultrasound examination of the right axilla was performed.

[Series 1: us axillary right · 0.07mm/px · 3 of 3 slices shown]
[im 1/3]
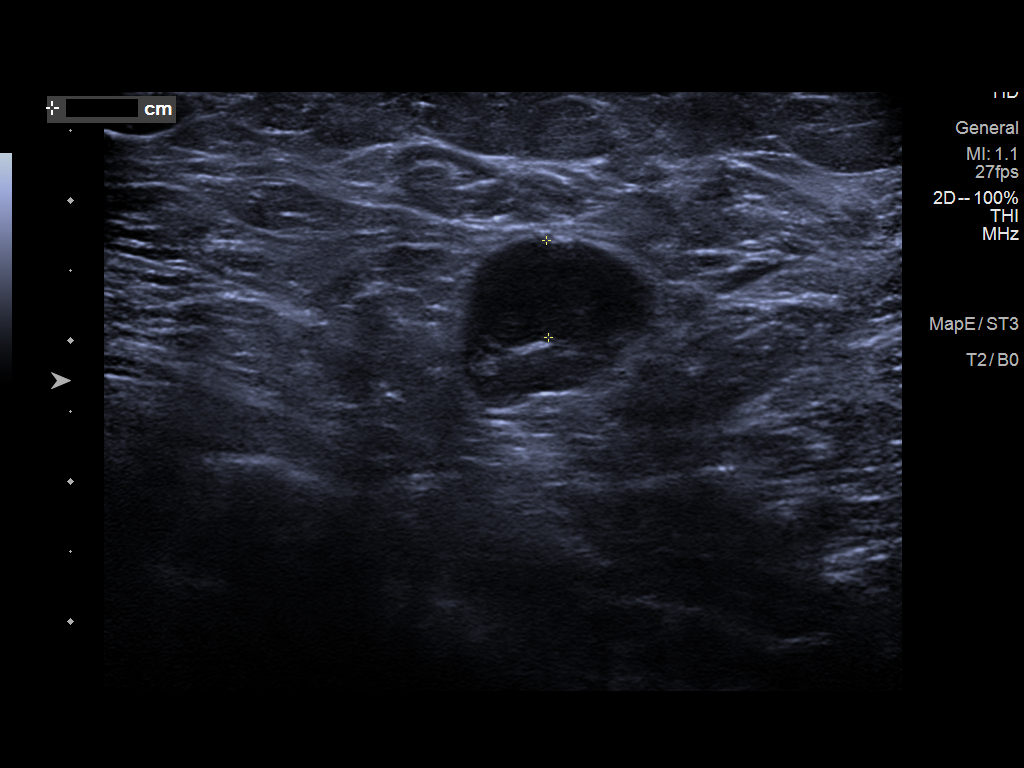
[im 2/3]
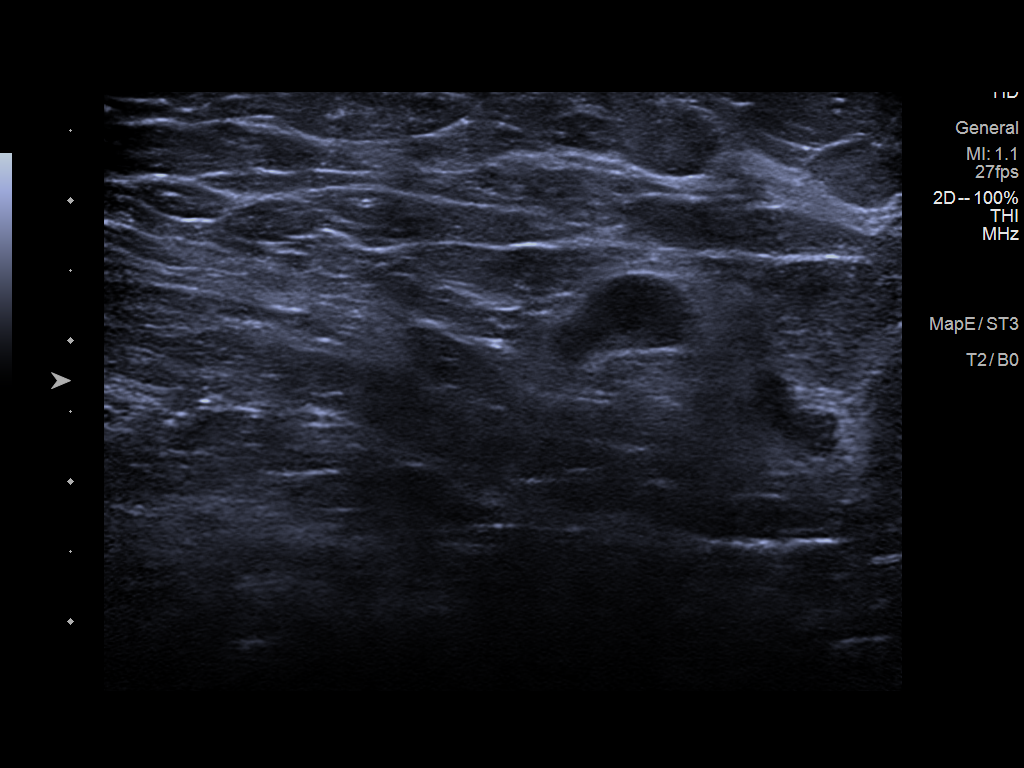
[im 3/3]
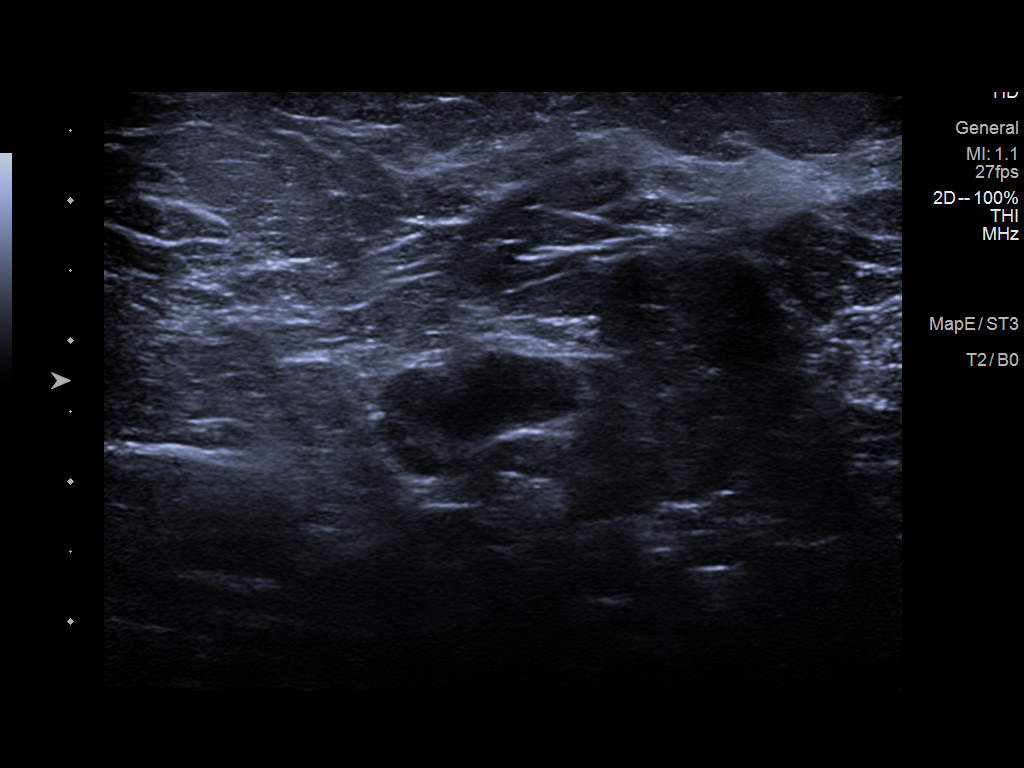

[3 of 3 positions shown; findings below may reference images not displayed]

ACR Breast Density Category c: The breast tissue is heterogeneously
dense, which may obscure small masses.
FINDINGS: Mammogram:

Spot 2D magnification views of the right breast were performed
demonstrating persistence of extensive pleomorphic calcifications
throughout the upper-outer quadrant of the right breast spanning at
least 6.8 cm. There is a possible distortion associated with the
calcifications on the full field mL view (image 67/90).

Spot compression tomosynthesis views of the right axilla were
performed demonstrating persistence of at least 1 lymph node with
cortical thickening.

Ultrasound:

Targeted ultrasound performed in the right axilla demonstrating 2
abnormal lymph nodes with cortical thickness measuring up to 0.7 cm.
IMPRESSION: 1. Extensive suspicious pleomorphic calcifications involving the
upper outer right breast measuring up to 6.8 cm. There is a possible
distortion associated with the calcifications.

2.  Suspicious right axillary lymph nodes (2).

RECOMMENDATION:
1. Stereotactic core needle biopsy x2 targeting the anterior and
posterior aspect of the calcifications. Recommend targeting the
possible area of distortion with calcifications at the anterior
aspect.

2. Ultrasound-guided core needle biopsy of a right axillary lymph
node.

I have discussed the findings and recommendations with the patient
who agrees to proceed with biopsy. The patient will be scheduled for
the biopsy appointment prior to leaving the office today.

BI-RADS CATEGORY  4: Suspicious.

## 2021-03-07 ENCOUNTER — Other Ambulatory Visit: Payer: Self-pay | Admitting: Family Medicine

## 2021-03-07 DIAGNOSIS — R921 Mammographic calcification found on diagnostic imaging of breast: Secondary | ICD-10-CM

## 2021-03-07 DIAGNOSIS — R928 Other abnormal and inconclusive findings on diagnostic imaging of breast: Secondary | ICD-10-CM

## 2021-03-14 ENCOUNTER — Ambulatory Visit
Admission: RE | Admit: 2021-03-14 | Discharge: 2021-03-14 | Disposition: A | Discharge status: Home / Self Care | Payer: Managed Care, Other (non HMO) | Source: Ambulatory Visit | Attending: Family Medicine | Admitting: Family Medicine

## 2021-03-14 ENCOUNTER — Other Ambulatory Visit: Payer: Self-pay

## 2021-03-14 DIAGNOSIS — R928 Other abnormal and inconclusive findings on diagnostic imaging of breast: Secondary | ICD-10-CM

## 2021-03-14 DIAGNOSIS — R921 Mammographic calcification found on diagnostic imaging of breast: Secondary | ICD-10-CM | POA: Diagnosis present

## 2021-03-14 HISTORY — PX: OTHER SURGICAL HISTORY: SHX169

## 2021-03-14 HISTORY — PX: BREAST BIOPSY: SHX20

## 2021-03-14 IMAGING — MG MM BREAST BX W/ LOC DEV 1ST LESION IMAGE BX SPEC STEREO GUIDE*R*
6 of 10 series · 6 of 18 positions shown · non-contrast
Comparison: Previous exams.
COMPARISON: Previous exams.

Addendum:
CLINICAL DATA: Patient presents for stereotactic core needle biopsy
of the anterior-posterior extents of suspicious right breast
calcifications as well as an ultrasound-guided core needle biopsy of
1 of 2 abnormal right axillary lymph nodes.

EXAM:
RIGHT BREAST STEREOTACTIC CORE NEEDLE BIOPSY: 2 BIOPSIES PERFORMED.
RIGHT AXILLARY LYMPH NODE ULTRASOUND-GUIDED CORE NEEDLE BIOPSY

[R (1 of 6)]
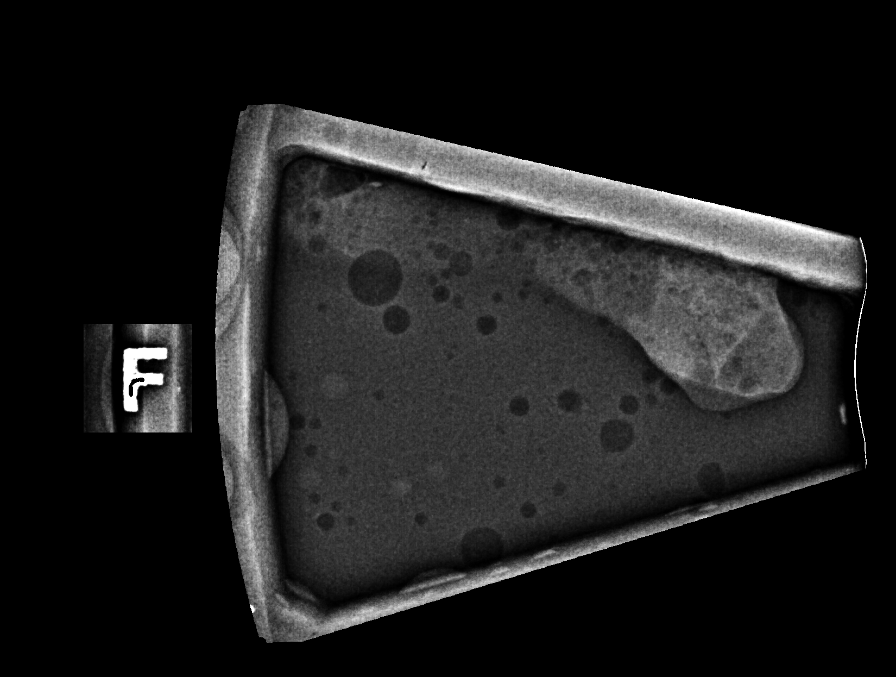

[R (2 of 6)]
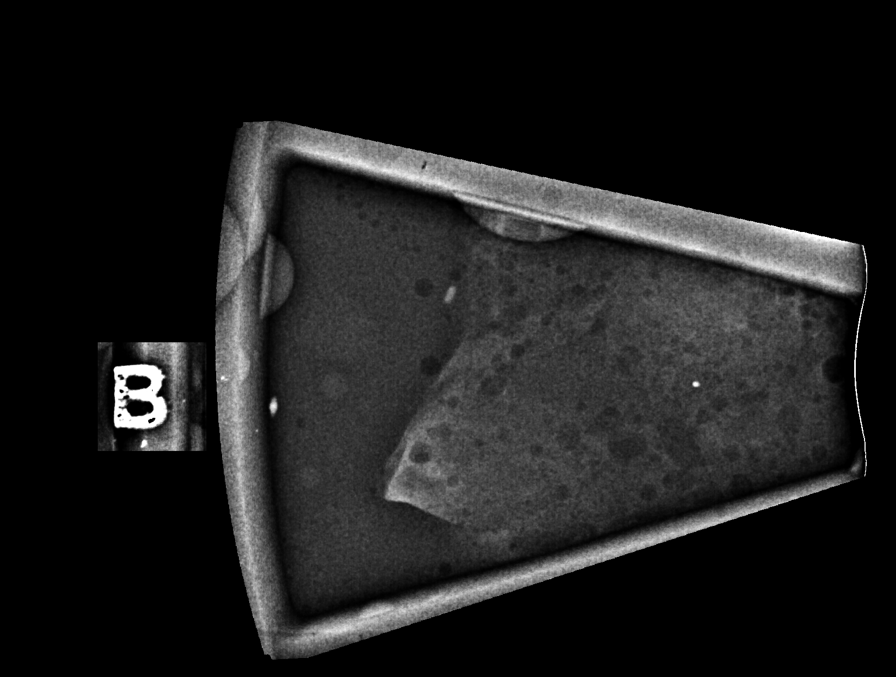

[R (3 of 6)]
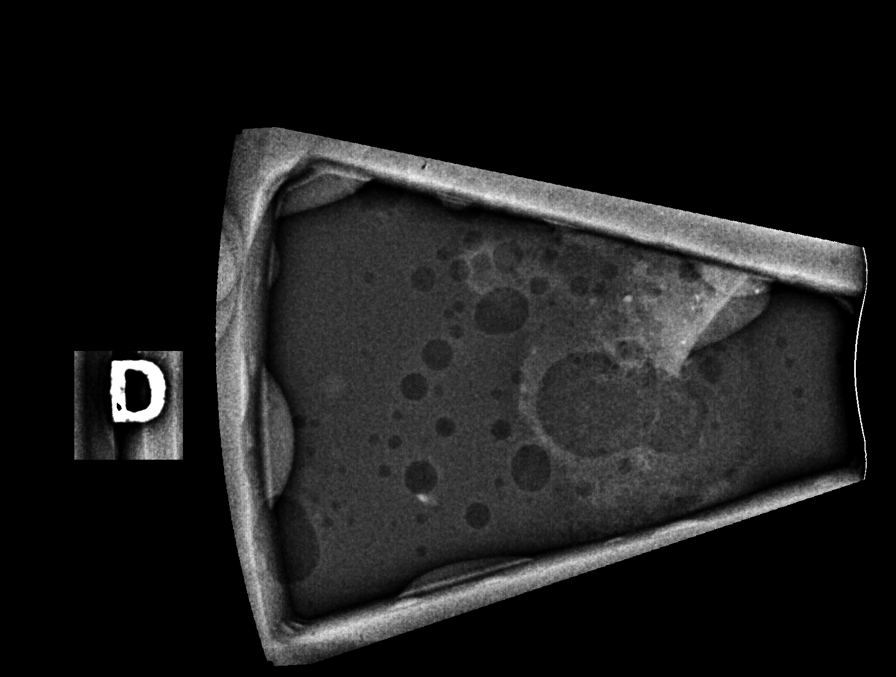

[R (4 of 6)]
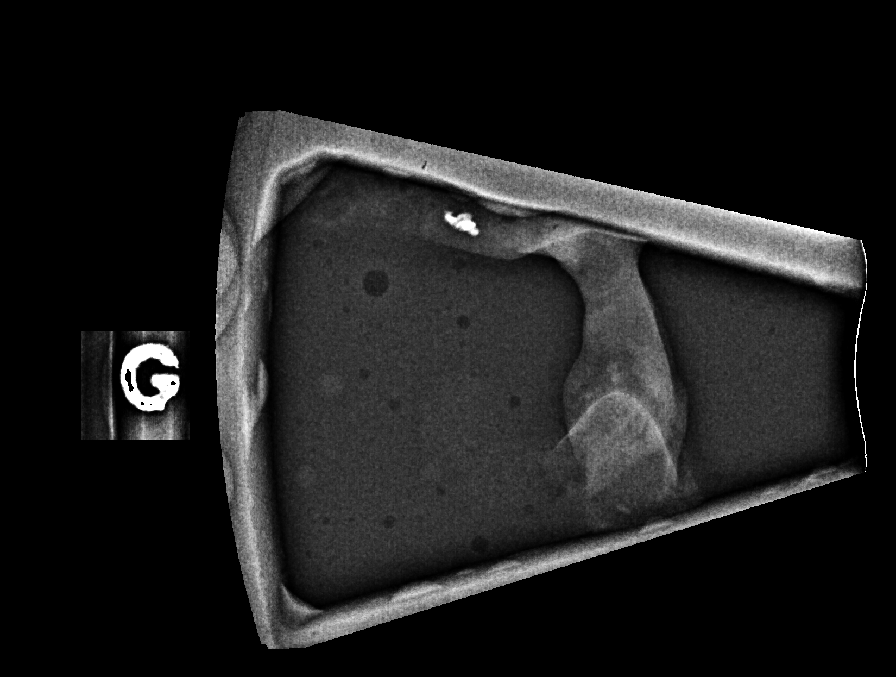

[R (5 of 6)]
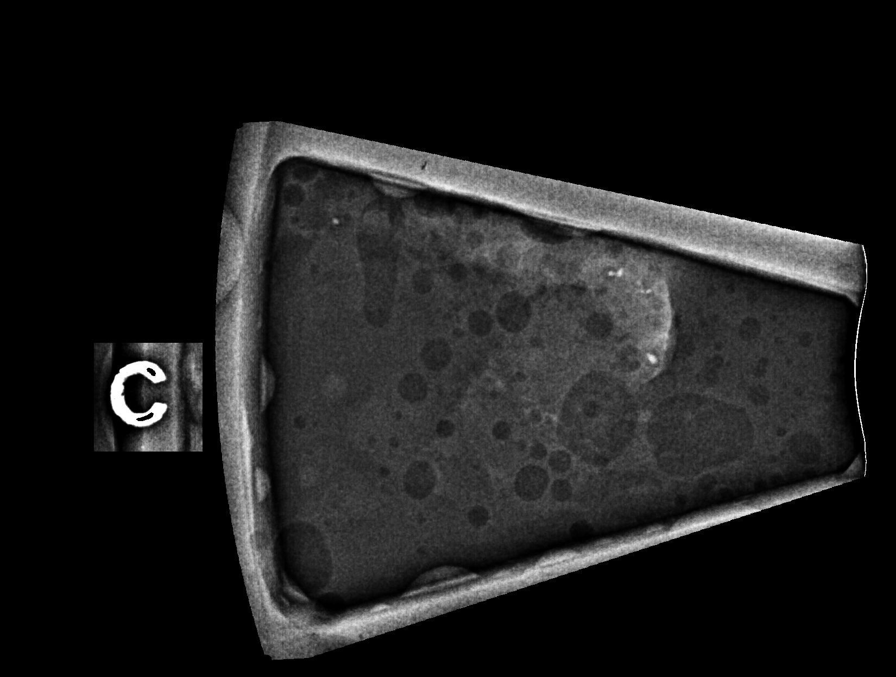

[R (6 of 6)]
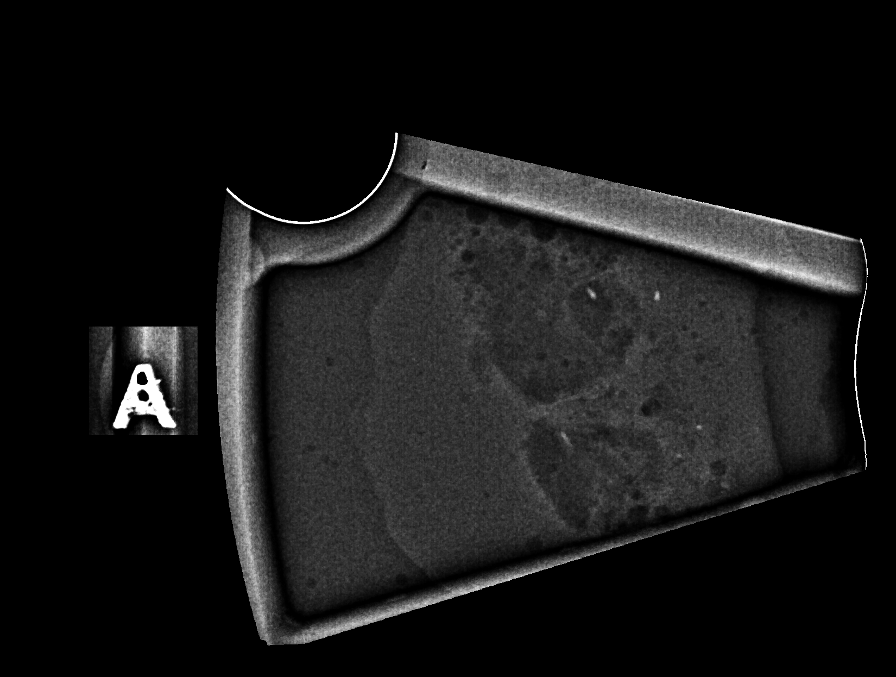

[6 of 18 positions shown; findings below may reference images not displayed]

FINDINGS: Stereotactic biopsies

The patient and I discussed the procedure of stereotactic-guided
biopsy including benefits and alternatives. We discussed the high
likelihood of a successful procedure. We discussed the risks of the
procedure including infection, bleeding, tissue injury, clip
migration, and inadequate sampling. Informed written consent was
given. The usual time out protocol was performed immediately prior
to the procedure.

Biopsy #1: Anterior extent of upper outer quadrant calcifications.

Using sterile technique and 1% Lidocaine as local anesthetic, under
stereotactic guidance, a 9 gauge vacuum assisted device was used to
perform core needle biopsy of calcifications in the upper outer
quadrant of the right breast using a lateral approach. Specimen
radiograph was performed showing multiple calcifications for which
biopsy was performed. Specimens with calcifications are identified
for pathology.

Lesion quadrant: Upper outer quadrant

At the conclusion of the procedure, a coil shaped shaped tissue
marker clip was deployed into the biopsy cavity.

Biopsy #2: Posterior extent of upper outer quadrant calcifications.

Using sterile technique and 1% Lidocaine as local anesthetic, under
stereotactic guidance, a 9 gauge vacuum assisted device was used to
perform core needle biopsy of calcifications in the upper outer
quadrant of the right breast using a lateral approach. Specimen
radiograph was performed showing multiple calcifications for which
biopsy was performed. Specimens with calcifications are identified
for pathology.

Lesion quadrant: Upper outer quadrant

At the conclusion of the procedure, a ribbon shaped tissue marker
clip was deployed into the biopsy cavity.

Ultrasound-guided biopsy.

I met with the patient and we discussed the procedure of
ultrasound-guided biopsy, including benefits and alternatives. We
discussed the high likelihood of a successful procedure. We
discussed the risks of the procedure, including infection, bleeding,
tissue injury, clip migration, and inadequate sampling. Informed
written consent was given. The usual time-out protocol was performed
immediately prior to the procedure.

Lesion quadrant: Upper outer quadrant, bordering the anterior
inferior right axilla.

Using sterile technique and 1% Lidocaine as local anesthetic, under
direct ultrasound visualization, a 14 gauge HELMER device was
used to perform biopsy of the abnormal lymph node in the far upper
outer quadrant of the right breast/anterior inferior right axilla
using an inferior approach. At the conclusion of the procedure a
HydroMARK tissue marker clip was deployed into the biopsy cavity.

Follow-up 2-view mammogram was performed and dictated separately.
IMPRESSION: Stereotactic-guided biopsy of the anterior and posterior extents of
suspicious right breast calcifications. Ultrasound-guided core
needle biopsy of 1 of 2 abnormal right axillary lymph nodes. No
apparent complications.

ADDENDUM:
PATHOLOGY revealed: Site A. RIGHT BREAST, ANTERIOR EXTENT
CALCIFICATIONS; STEREOTACTIC BIOPSY: - INVASIVE MAMMARY CARCINOMA,
NO SPECIAL TYPE. Size of invasive carcinoma: 15 mm in this sample.
Grade 3. Ductal carcinoma in situ: Present, high-grade with
comedonecrosis. Lymphovascular invasion: Not identified.

Pathology results are CONCORDANT with imaging findings, per Dr.
HELMER.

PATHOLOGY revealed: Site B. RIGHT BREAST, POSTERIOR EXTENT
CALCIFICATIONS; STEREOTACTIC BIOPSY: - INVASIVE MAMMARY CARCINOMA,
AS DESCRIBED ABOVE, AT LEAST 17 MM.

Pathology results are CONCORDANT with imaging findings, per Dr.
HELMER.

PATHOLOGY revealed: Site C. LYMPH NODE, RIGHT AXILLA;
ULTRASOUND-GUIDED BIOPSY: - METASTATIC MAMMARY CARCINOMA, AT LEAST
12 MM.

Pathology results are CONCORDANT with imaging findings, per Dr.
HELMER.

Pathology results and recommendations below were discussed with
patient by provider (HELMER) via telephone on [DATE].
Provider will arrange surgical and oncological consultation for
patient. Biopsy site assessed by HELMER RN on [DATE];
patient reported biopsy site within normal limits with slight
tenderness at the site. Post biopsy care instructions were reviewed,
questions were answered and my direct phone number was provided to
patient. Patient was instructed to call [HOSPITAL] if
any concerns or questions arise related to the biopsy.

RECOMMENDATIONS: 1. Surgical and oncological consultation. HELMER
HELMER RN at [HOSPITAL] [HOSPITAL] was notified on
[DATE] that provider (Dr. HELMER) will arrange surgical
and oncological consultation, and inform patient of appointment
details.

2. Consider bilateral breast MRI to evaluate extent of breast
disease.

Pathology results reported by HELMER RN on [DATE].

*** End of Addendum ***
FINDINGS: Stereotactic biopsies

The patient and I discussed the procedure of stereotactic-guided
biopsy including benefits and alternatives. We discussed the high
likelihood of a successful procedure. We discussed the risks of the
procedure including infection, bleeding, tissue injury, clip
migration, and inadequate sampling. Informed written consent was
given. The usual time out protocol was performed immediately prior
to the procedure.

Biopsy #1: Anterior extent of upper outer quadrant calcifications.

Using sterile technique and 1% Lidocaine as local anesthetic, under
stereotactic guidance, a 9 gauge vacuum assisted device was used to
perform core needle biopsy of calcifications in the upper outer
quadrant of the right breast using a lateral approach. Specimen
radiograph was performed showing multiple calcifications for which
biopsy was performed. Specimens with calcifications are identified
for pathology.

Lesion quadrant: Upper outer quadrant

At the conclusion of the procedure, a coil shaped shaped tissue
marker clip was deployed into the biopsy cavity.

Biopsy #2: Posterior extent of upper outer quadrant calcifications.

Using sterile technique and 1% Lidocaine as local anesthetic, under
stereotactic guidance, a 9 gauge vacuum assisted device was used to
perform core needle biopsy of calcifications in the upper outer
quadrant of the right breast using a lateral approach. Specimen
radiograph was performed showing multiple calcifications for which
biopsy was performed. Specimens with calcifications are identified
for pathology.

Lesion quadrant: Upper outer quadrant

At the conclusion of the procedure, a ribbon shaped tissue marker
clip was deployed into the biopsy cavity.

Ultrasound-guided biopsy.

I met with the patient and we discussed the procedure of
ultrasound-guided biopsy, including benefits and alternatives. We
discussed the high likelihood of a successful procedure. We
discussed the risks of the procedure, including infection, bleeding,
tissue injury, clip migration, and inadequate sampling. Informed
written consent was given. The usual time-out protocol was performed
immediately prior to the procedure.

Lesion quadrant: Upper outer quadrant, bordering the anterior
inferior right axilla.

Using sterile technique and 1% Lidocaine as local anesthetic, under
direct ultrasound visualization, a 14 gauge HELMER device was
used to perform biopsy of the abnormal lymph node in the far upper
outer quadrant of the right breast/anterior inferior right axilla
using an inferior approach. At the conclusion of the procedure a
HydroMARK tissue marker clip was deployed into the biopsy cavity.

Follow-up 2-view mammogram was performed and dictated separately.
IMPRESSION: Stereotactic-guided biopsy of the anterior and posterior extents of
suspicious right breast calcifications. Ultrasound-guided core
needle biopsy of 1 of 2 abnormal right axillary lymph nodes. No
apparent complications.

## 2021-03-14 IMAGING — MG MM BREAST LOCALIZATION CLIP
4 series · 4 of 12 positions shown · non-contrast
Comparison: Previous exam(s).

CLINICAL DATA: Evaluate post biopsy marker clip placement following
stereotactic core needle biopsy of the anterior-posterior extents of
right breast calcifications and ultrasound-guided core needle biopsy
of a right axillary lymph node.

EXAM:
3D DIAGNOSTIC RIGHT MAMMOGRAM POST STEREOTACTIC AND ULTRASOUND
BIOPSY

[R LM synth-2D]
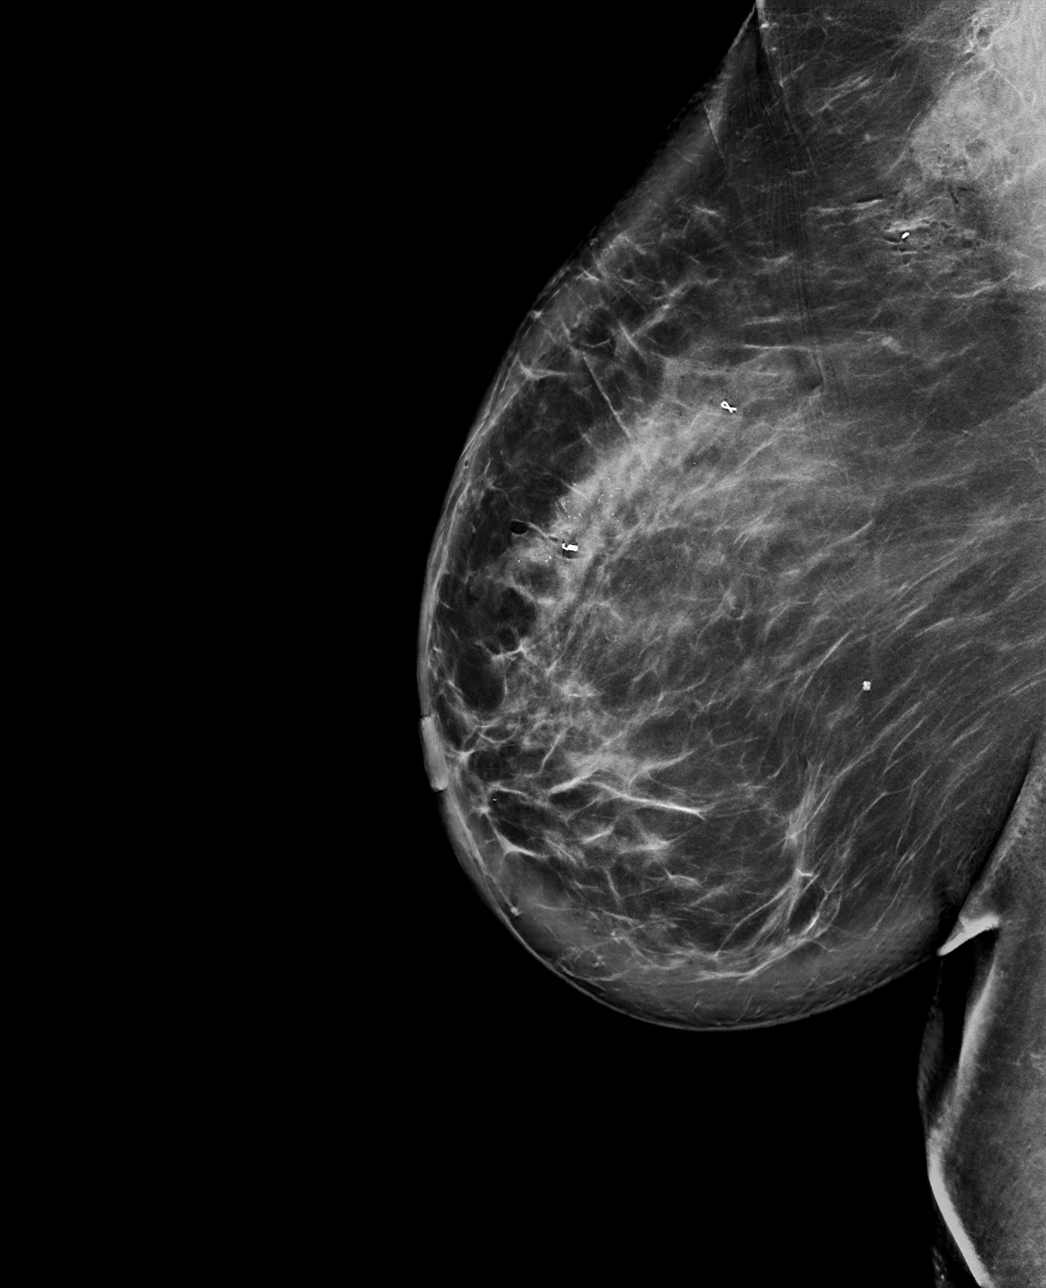

[R CC synth-2D]
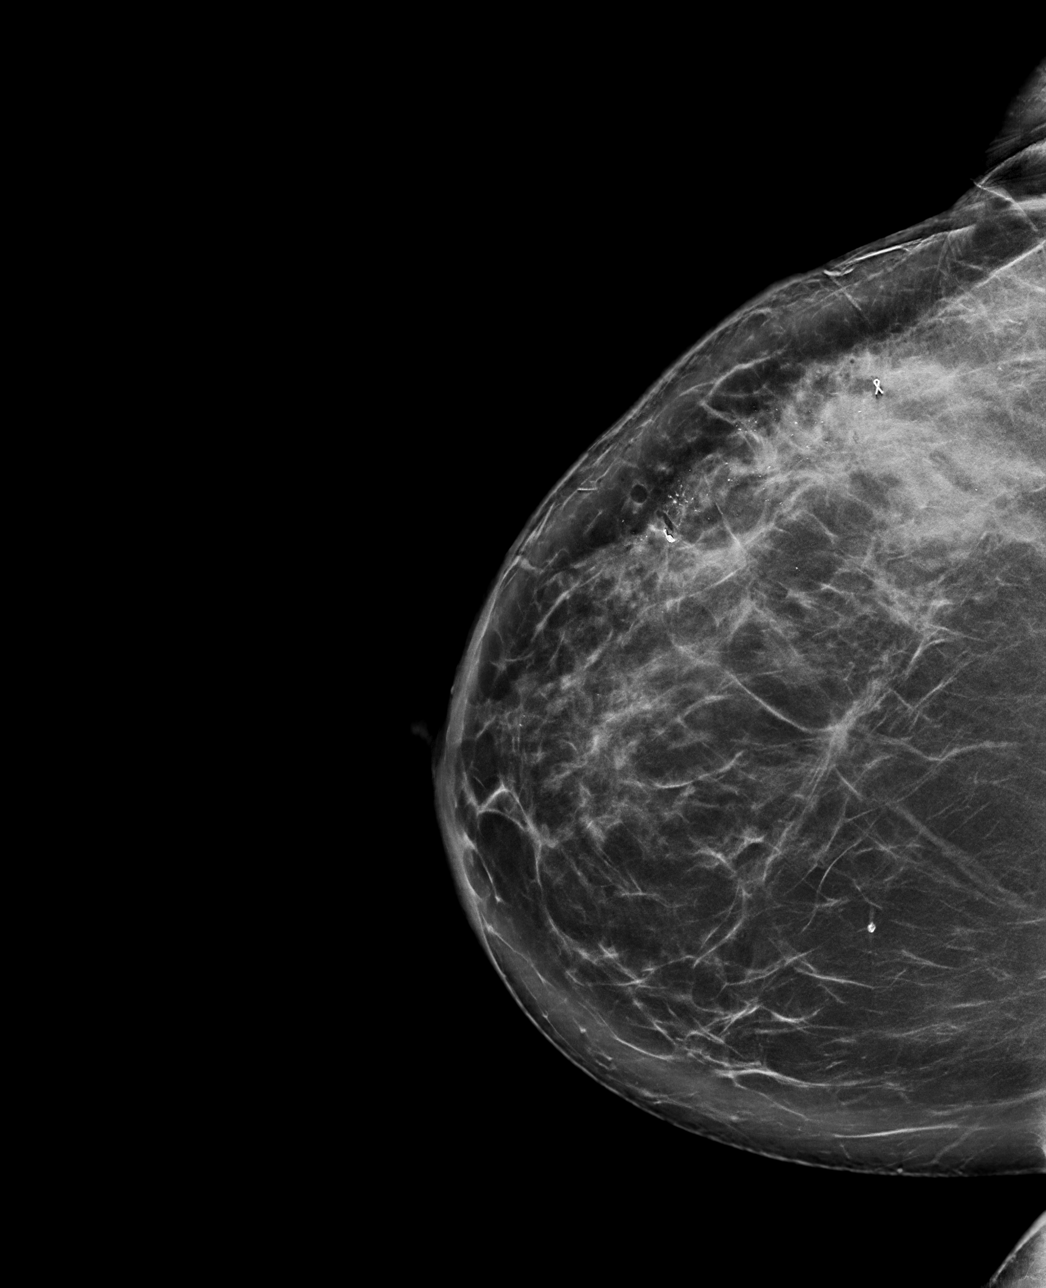

[R LM tomo · tomo slice 61/121.0]
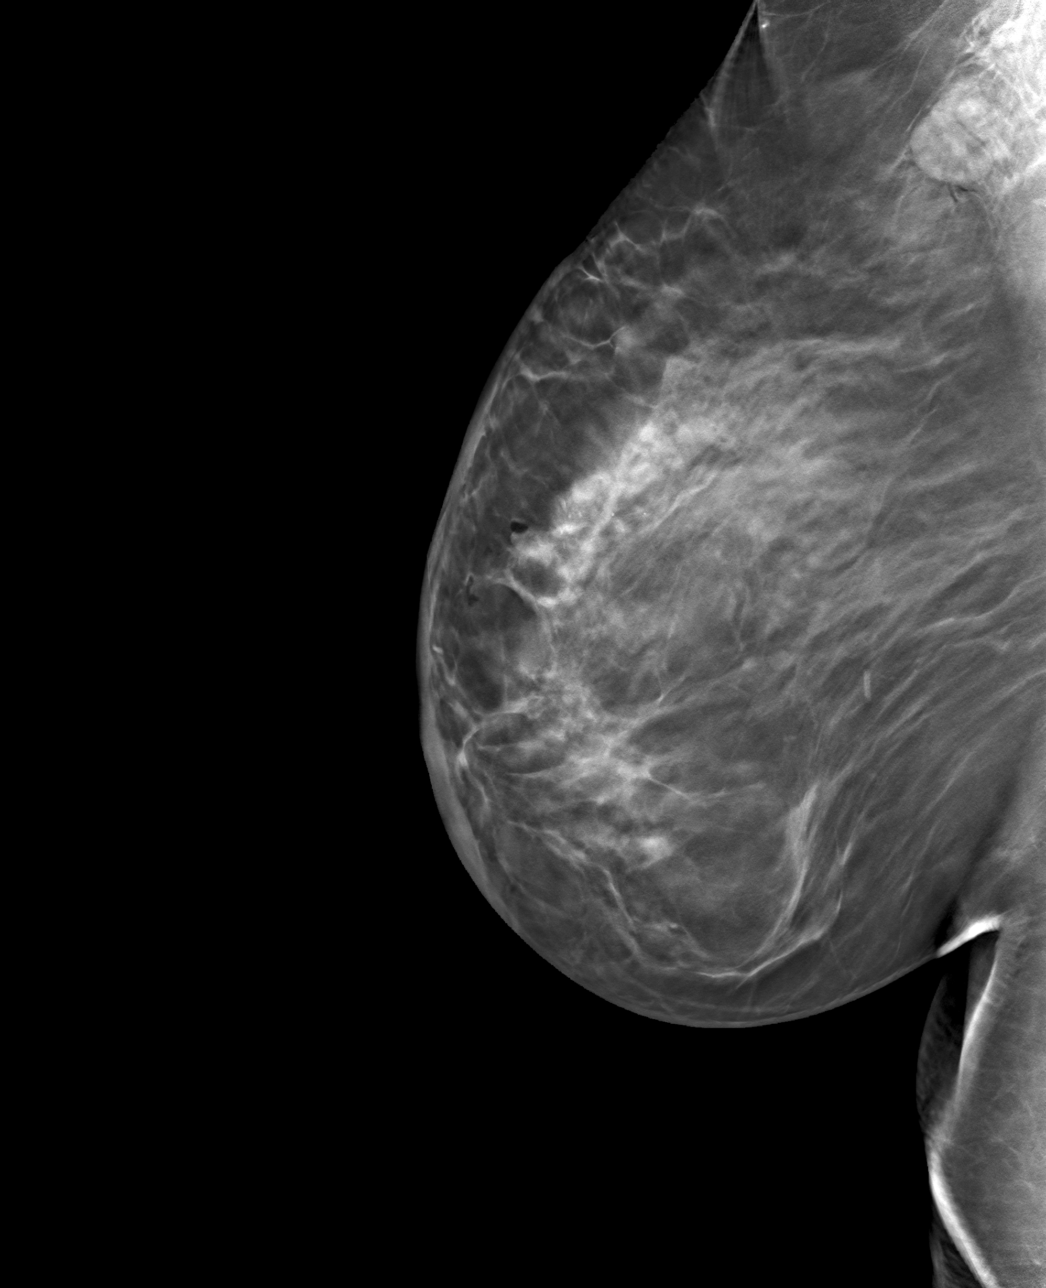

[R CC tomo · tomo slice 54/107.0]
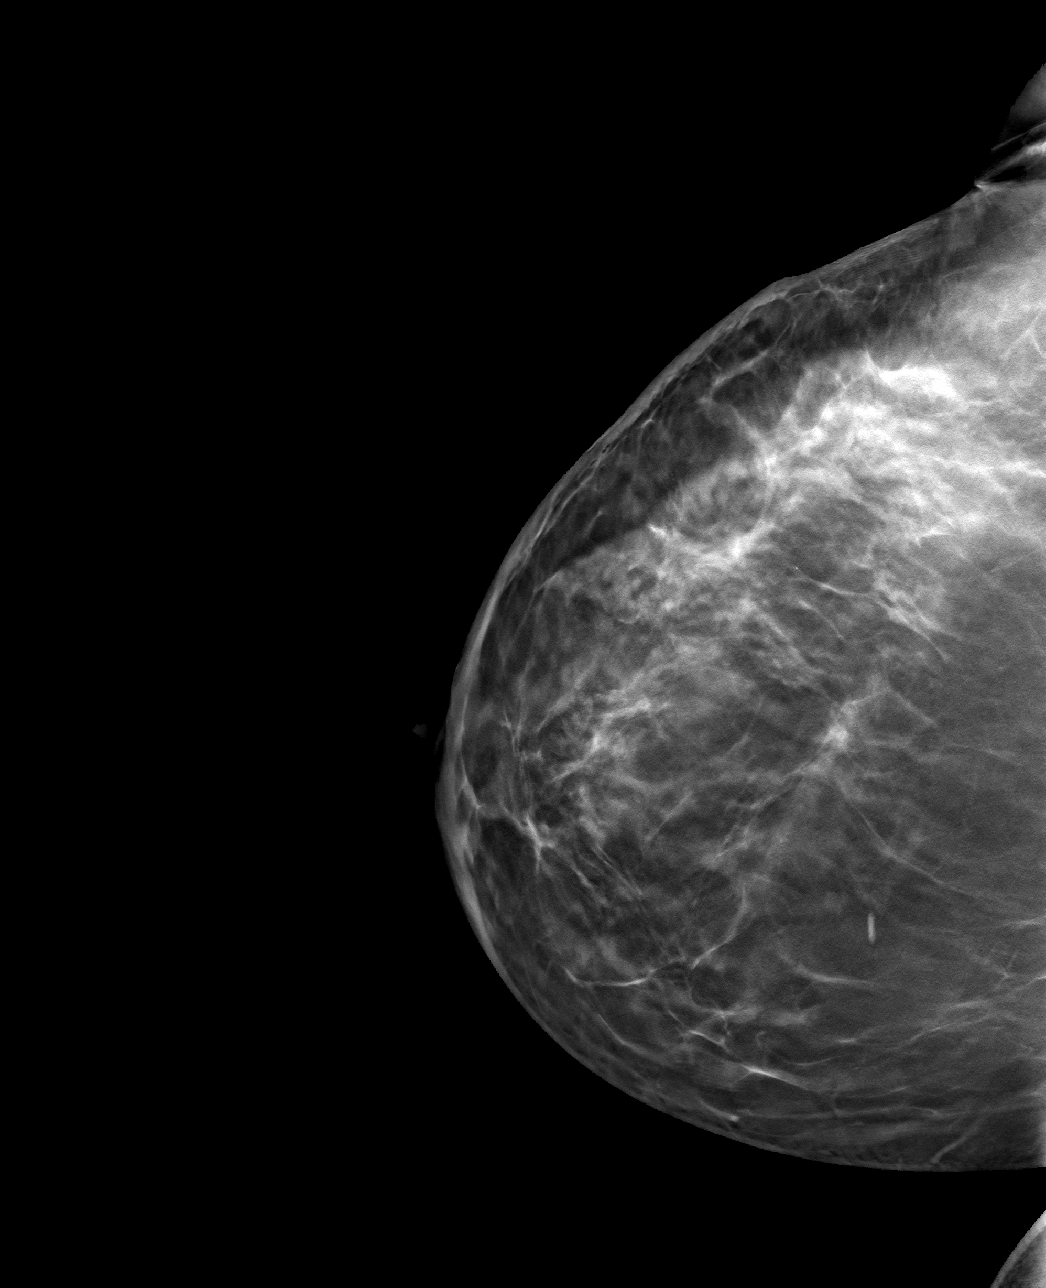

[4 of 12 positions shown; findings below may reference images not displayed]

FINDINGS: 3D Mammographic images were obtained following stereotactic and
ultrasound guided biopsy of the anterior-posterior extents of right
breast calcifications and an abnormal right axillary lymph node. The
biopsy marking clips for the calcifications are well positioned. The
HydroMARK biopsy clip projects 1.5 cm anterior to the biopsied lymph
node.
IMPRESSION: Appropriate positioning of the coil and ribbon shaped biopsy marking
clip at the site of biopsy in the upper outer quadrant of the right
breast adjacent to residual calcifications.

HydroMARK biopsy clip lies 1.5 cm anterior to the biopsied right
axillary lymph node.

Final Assessment: Post Procedure Mammograms for Marker Placement

## 2021-03-14 IMAGING — MG MM BREAST BX W/ LOC DEV EA AD LESION IMAG BX SPEC STEREO GUIDE*R
6 of 10 series · 6 of 18 positions shown · non-contrast
Comparison: Previous exams.
COMPARISON: Previous exams.

Addendum:
CLINICAL DATA: Patient presents for stereotactic core needle biopsy
of the anterior-posterior extents of suspicious right breast
calcifications as well as an ultrasound-guided core needle biopsy of
1 of 2 abnormal right axillary lymph nodes.

EXAM:
RIGHT BREAST STEREOTACTIC CORE NEEDLE BIOPSY: 2 BIOPSIES PERFORMED.
RIGHT AXILLARY LYMPH NODE ULTRASOUND-GUIDED CORE NEEDLE BIOPSY

[R (1 of 6)]
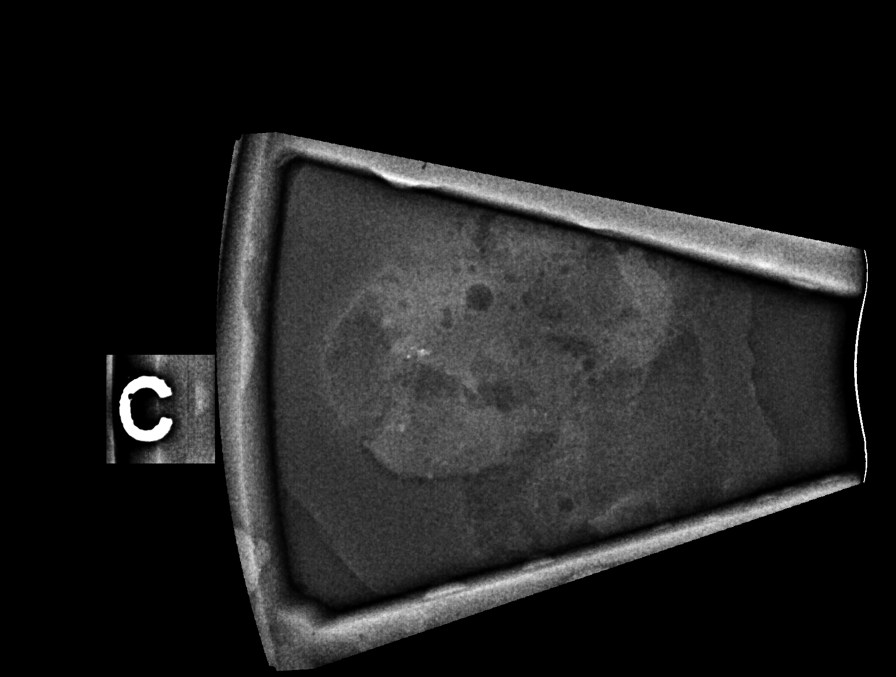

[R (2 of 6)]
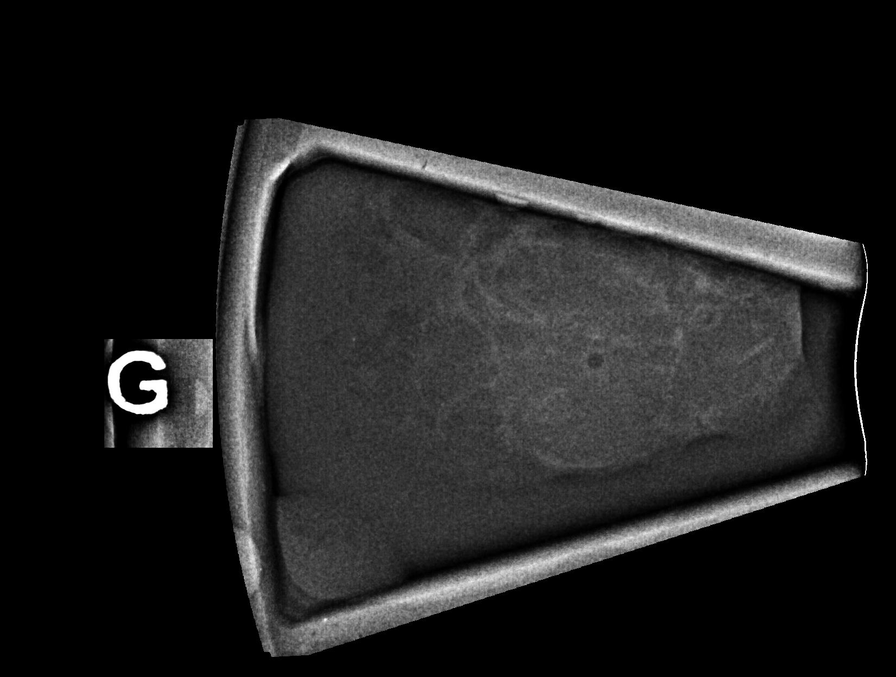

[R (3 of 6)]
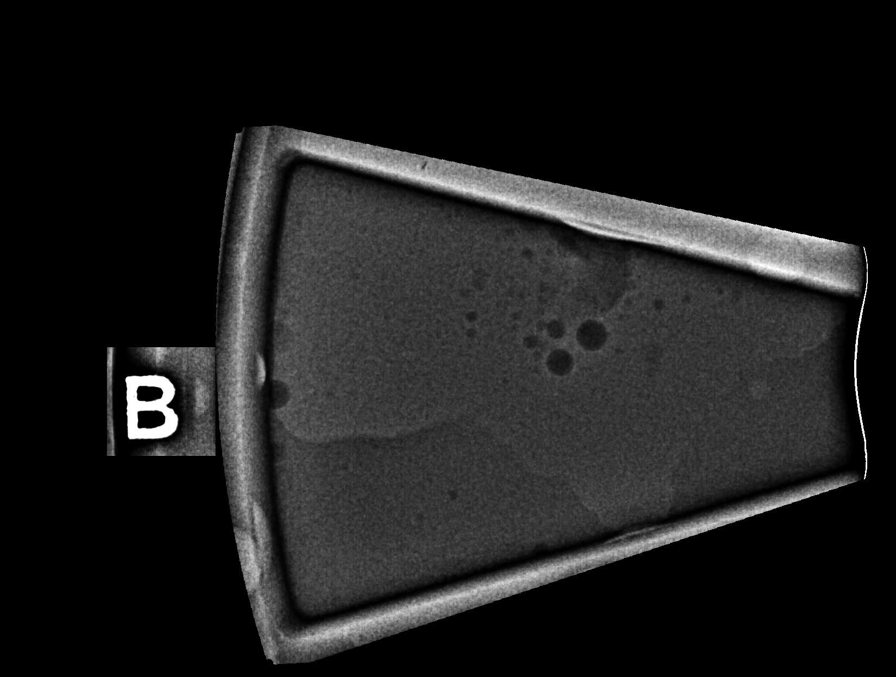

[R (4 of 6)]
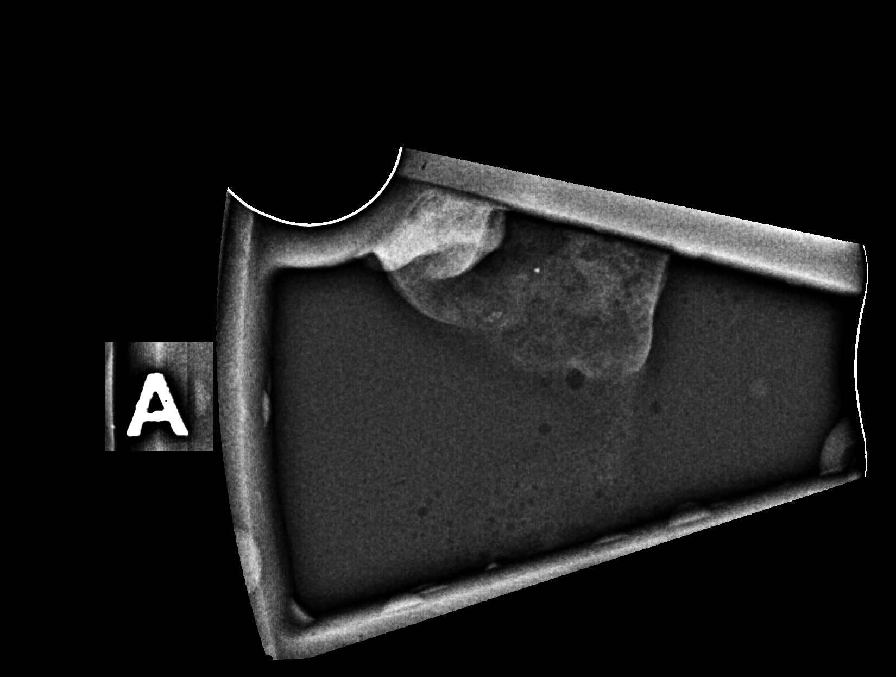

[R (5 of 6)]
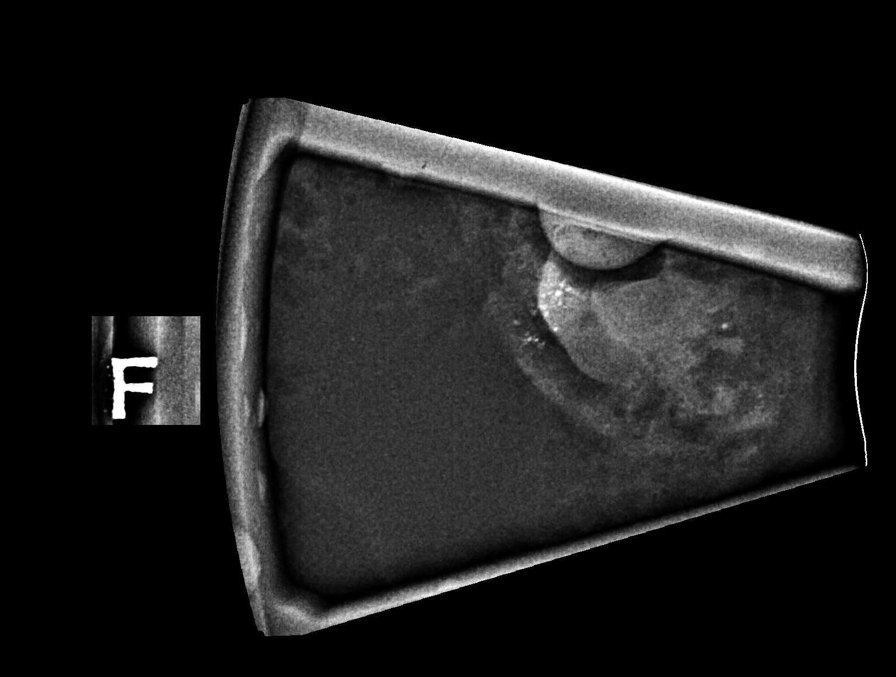

[R (6 of 6)]
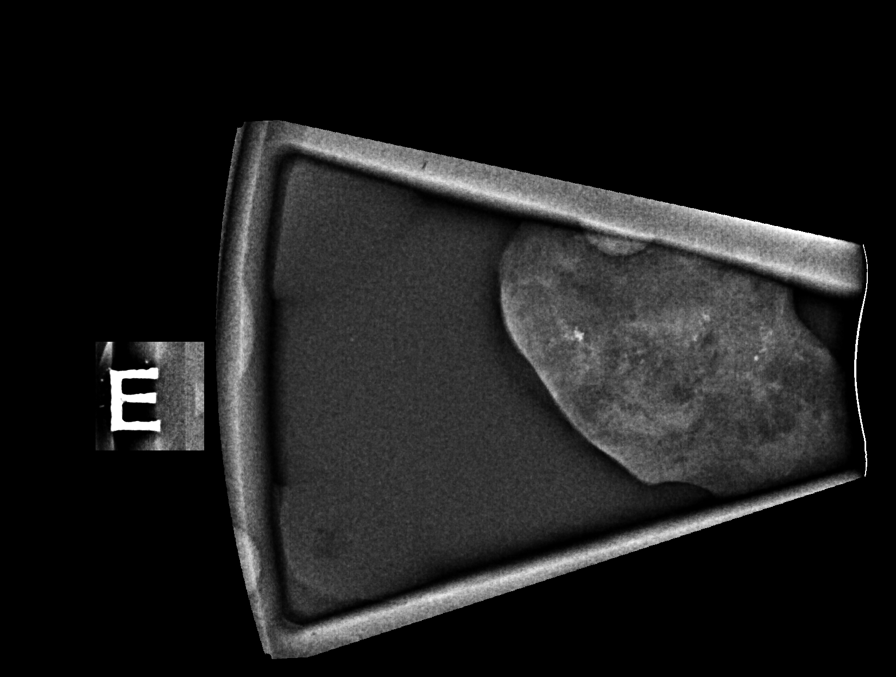

[6 of 18 positions shown; findings below may reference images not displayed]

FINDINGS: Stereotactic biopsies

The patient and I discussed the procedure of stereotactic-guided
biopsy including benefits and alternatives. We discussed the high
likelihood of a successful procedure. We discussed the risks of the
procedure including infection, bleeding, tissue injury, clip
migration, and inadequate sampling. Informed written consent was
given. The usual time out protocol was performed immediately prior
to the procedure.

Biopsy #1: Anterior extent of upper outer quadrant calcifications.

Using sterile technique and 1% Lidocaine as local anesthetic, under
stereotactic guidance, a 9 gauge vacuum assisted device was used to
perform core needle biopsy of calcifications in the upper outer
quadrant of the right breast using a lateral approach. Specimen
radiograph was performed showing multiple calcifications for which
biopsy was performed. Specimens with calcifications are identified
for pathology.

Lesion quadrant: Upper outer quadrant

At the conclusion of the procedure, a coil shaped shaped tissue
marker clip was deployed into the biopsy cavity.

Biopsy #2: Posterior extent of upper outer quadrant calcifications.

Using sterile technique and 1% Lidocaine as local anesthetic, under
stereotactic guidance, a 9 gauge vacuum assisted device was used to
perform core needle biopsy of calcifications in the upper outer
quadrant of the right breast using a lateral approach. Specimen
radiograph was performed showing multiple calcifications for which
biopsy was performed. Specimens with calcifications are identified
for pathology.

Lesion quadrant: Upper outer quadrant

At the conclusion of the procedure, a ribbon shaped tissue marker
clip was deployed into the biopsy cavity.

Ultrasound-guided biopsy.

I met with the patient and we discussed the procedure of
ultrasound-guided biopsy, including benefits and alternatives. We
discussed the high likelihood of a successful procedure. We
discussed the risks of the procedure, including infection, bleeding,
tissue injury, clip migration, and inadequate sampling. Informed
written consent was given. The usual time-out protocol was performed
immediately prior to the procedure.

Lesion quadrant: Upper outer quadrant, bordering the anterior
inferior right axilla.

Using sterile technique and 1% Lidocaine as local anesthetic, under
direct ultrasound visualization, a 14 gauge HELMER device was
used to perform biopsy of the abnormal lymph node in the far upper
outer quadrant of the right breast/anterior inferior right axilla
using an inferior approach. At the conclusion of the procedure a
HydroMARK tissue marker clip was deployed into the biopsy cavity.

Follow-up 2-view mammogram was performed and dictated separately.
IMPRESSION: Stereotactic-guided biopsy of the anterior and posterior extents of
suspicious right breast calcifications. Ultrasound-guided core
needle biopsy of 1 of 2 abnormal right axillary lymph nodes. No
apparent complications.

ADDENDUM:
PATHOLOGY revealed: Site A. RIGHT BREAST, ANTERIOR EXTENT
CALCIFICATIONS; STEREOTACTIC BIOPSY: - INVASIVE MAMMARY CARCINOMA,
NO SPECIAL TYPE. Size of invasive carcinoma: 15 mm in this sample.
Grade 3. Ductal carcinoma in situ: Present, high-grade with
comedonecrosis. Lymphovascular invasion: Not identified.

Pathology results are CONCORDANT with imaging findings, per Dr.
HELMER.

PATHOLOGY revealed: Site B. RIGHT BREAST, POSTERIOR EXTENT
CALCIFICATIONS; STEREOTACTIC BIOPSY: - INVASIVE MAMMARY CARCINOMA,
AS DESCRIBED ABOVE, AT LEAST 17 MM.

Pathology results are CONCORDANT with imaging findings, per Dr.
HELMER.

PATHOLOGY revealed: Site C. LYMPH NODE, RIGHT AXILLA;
ULTRASOUND-GUIDED BIOPSY: - METASTATIC MAMMARY CARCINOMA, AT LEAST
12 MM.

Pathology results are CONCORDANT with imaging findings, per Dr.
HELMER.

Pathology results and recommendations below were discussed with
patient by provider (HELMER) via telephone on [DATE].
Provider will arrange surgical and oncological consultation for
patient. Biopsy site assessed by HELMER RN on [DATE];
patient reported biopsy site within normal limits with slight
tenderness at the site. Post biopsy care instructions were reviewed,
questions were answered and my direct phone number was provided to
patient. Patient was instructed to call [HOSPITAL] if
any concerns or questions arise related to the biopsy.

RECOMMENDATIONS: 1. Surgical and oncological consultation. HELMER
HELMER RN at [HOSPITAL] [HOSPITAL] was notified on
[DATE] that provider (Dr. HELMER) will arrange surgical
and oncological consultation, and inform patient of appointment
details.

2. Consider bilateral breast MRI to evaluate extent of breast
disease.

Pathology results reported by HELMER RN on [DATE].

*** End of Addendum ***
FINDINGS: Stereotactic biopsies

The patient and I discussed the procedure of stereotactic-guided
biopsy including benefits and alternatives. We discussed the high
likelihood of a successful procedure. We discussed the risks of the
procedure including infection, bleeding, tissue injury, clip
migration, and inadequate sampling. Informed written consent was
given. The usual time out protocol was performed immediately prior
to the procedure.

Biopsy #1: Anterior extent of upper outer quadrant calcifications.

Using sterile technique and 1% Lidocaine as local anesthetic, under
stereotactic guidance, a 9 gauge vacuum assisted device was used to
perform core needle biopsy of calcifications in the upper outer
quadrant of the right breast using a lateral approach. Specimen
radiograph was performed showing multiple calcifications for which
biopsy was performed. Specimens with calcifications are identified
for pathology.

Lesion quadrant: Upper outer quadrant

At the conclusion of the procedure, a coil shaped shaped tissue
marker clip was deployed into the biopsy cavity.

Biopsy #2: Posterior extent of upper outer quadrant calcifications.

Using sterile technique and 1% Lidocaine as local anesthetic, under
stereotactic guidance, a 9 gauge vacuum assisted device was used to
perform core needle biopsy of calcifications in the upper outer
quadrant of the right breast using a lateral approach. Specimen
radiograph was performed showing multiple calcifications for which
biopsy was performed. Specimens with calcifications are identified
for pathology.

Lesion quadrant: Upper outer quadrant

At the conclusion of the procedure, a ribbon shaped tissue marker
clip was deployed into the biopsy cavity.

Ultrasound-guided biopsy.

I met with the patient and we discussed the procedure of
ultrasound-guided biopsy, including benefits and alternatives. We
discussed the high likelihood of a successful procedure. We
discussed the risks of the procedure, including infection, bleeding,
tissue injury, clip migration, and inadequate sampling. Informed
written consent was given. The usual time-out protocol was performed
immediately prior to the procedure.

Lesion quadrant: Upper outer quadrant, bordering the anterior
inferior right axilla.

Using sterile technique and 1% Lidocaine as local anesthetic, under
direct ultrasound visualization, a 14 gauge HELMER device was
used to perform biopsy of the abnormal lymph node in the far upper
outer quadrant of the right breast/anterior inferior right axilla
using an inferior approach. At the conclusion of the procedure a
HydroMARK tissue marker clip was deployed into the biopsy cavity.

Follow-up 2-view mammogram was performed and dictated separately.
IMPRESSION: Stereotactic-guided biopsy of the anterior and posterior extents of
suspicious right breast calcifications. Ultrasound-guided core
needle biopsy of 1 of 2 abnormal right axillary lymph nodes. No
apparent complications.

## 2021-03-14 IMAGING — MG US  BREAST BX W/ LOC DEV 1ST LESION IMG BX SPEC US GUIDE*R*
1 series · 6 of 8 positions shown · non-contrast
Comparison: Previous exams.
COMPARISON: Previous exams.

Addendum:
CLINICAL DATA: Patient presents for stereotactic core needle biopsy
of the anterior-posterior extents of suspicious right breast
calcifications as well as an ultrasound-guided core needle biopsy of
1 of 2 abnormal right axillary lymph nodes.

EXAM:
RIGHT BREAST STEREOTACTIC CORE NEEDLE BIOPSY: 2 BIOPSIES PERFORMED.
RIGHT AXILLARY LYMPH NODE ULTRASOUND-GUIDED CORE NEEDLE BIOPSY

[Series 1: MG view · 0.07mm/px · 6 of 10 slices shown]
[im 1/10]
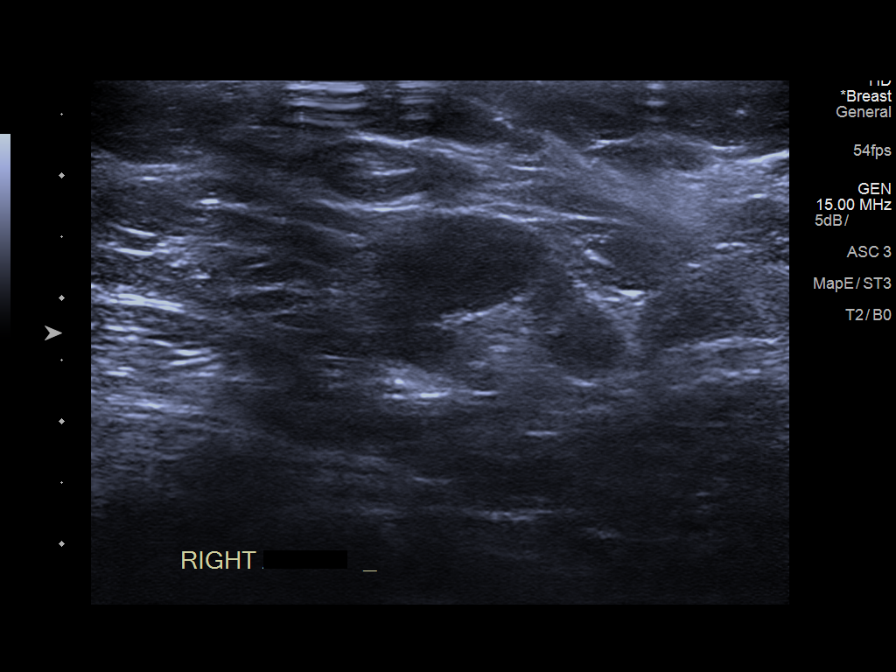
[im 2/10]
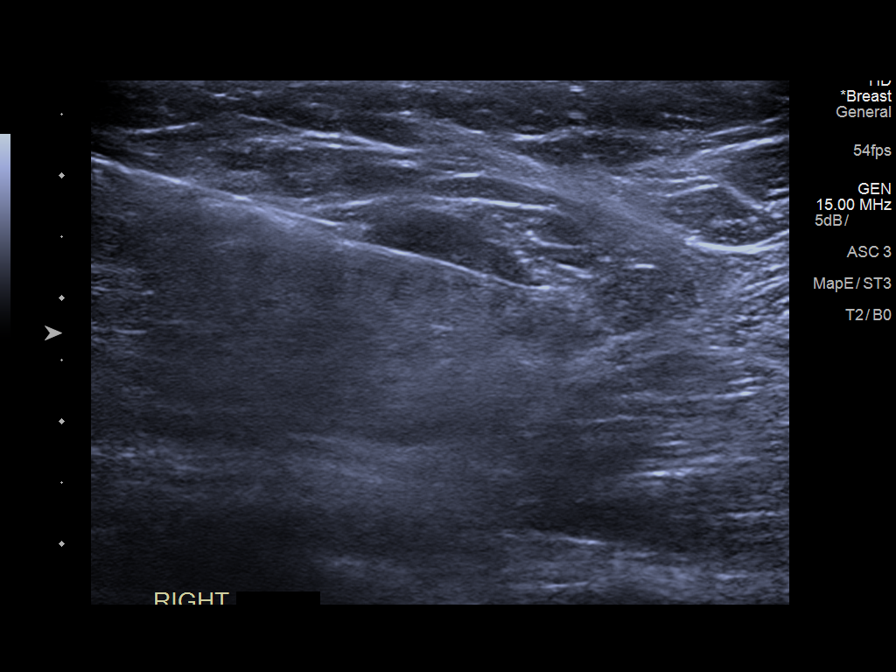
[im 3/10]
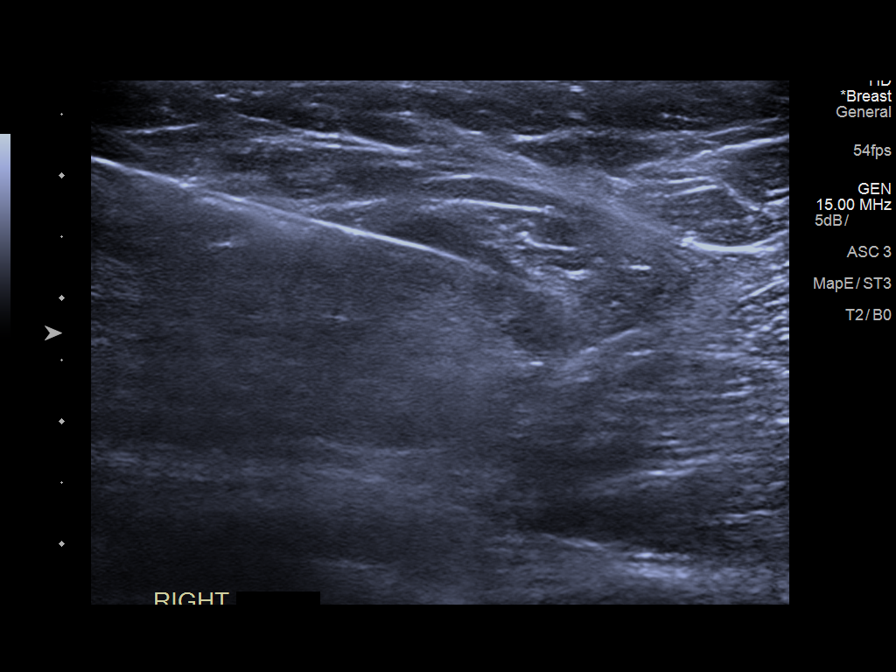
[im 4/10]
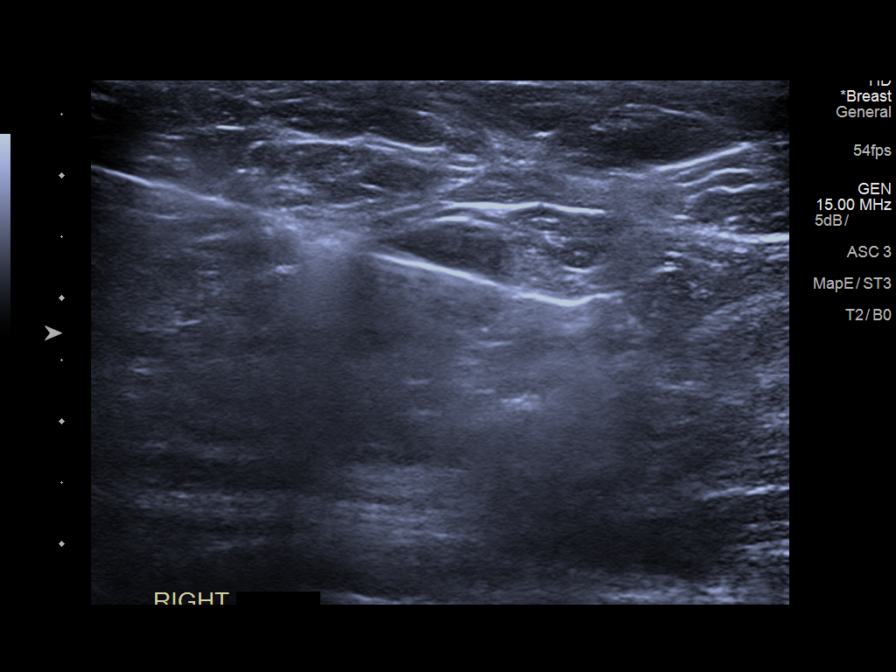
[im 6/10]
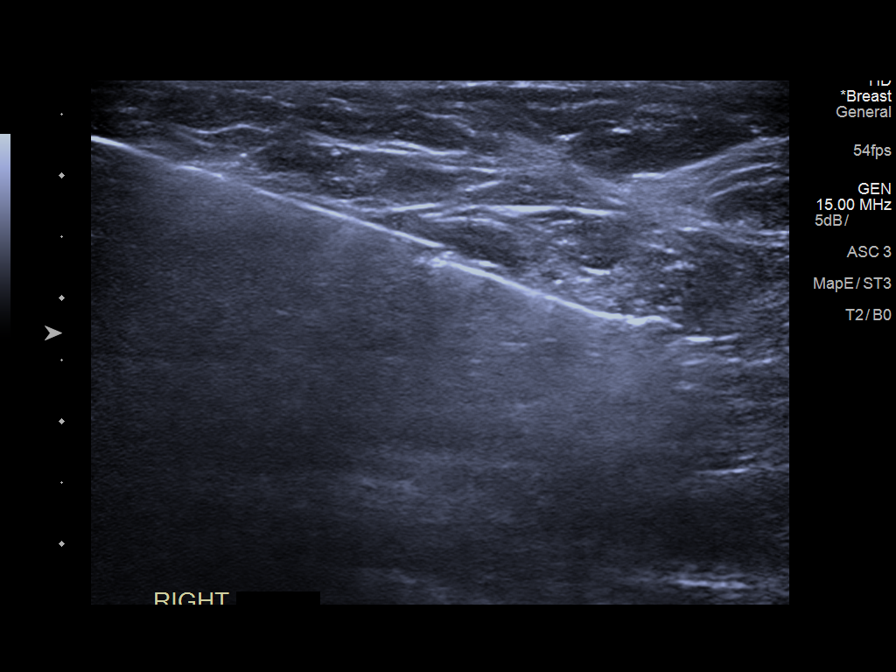
[im 7/10]
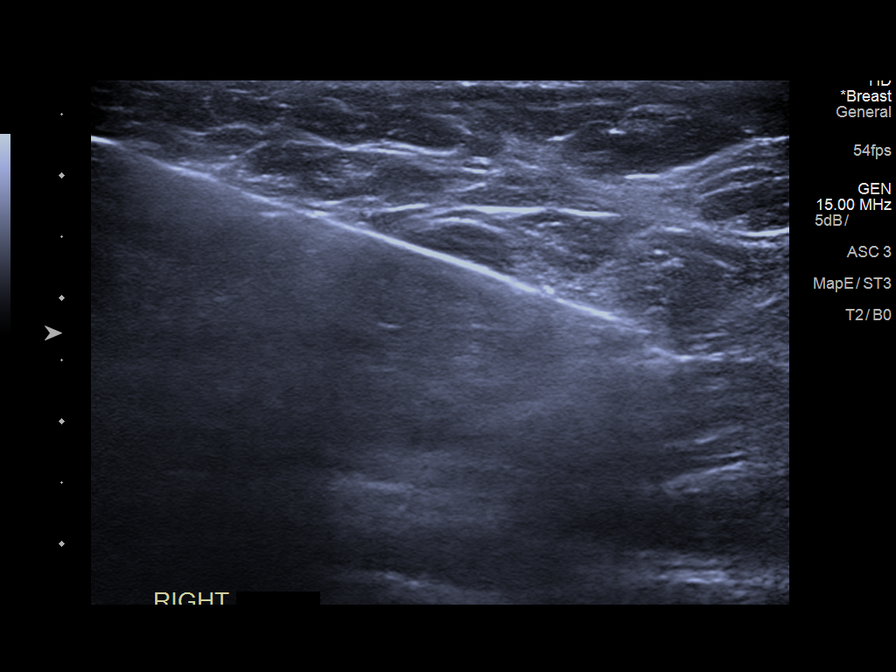

[6 of 8 positions shown; findings below may reference images not displayed]

FINDINGS: Stereotactic biopsies

The patient and I discussed the procedure of stereotactic-guided
biopsy including benefits and alternatives. We discussed the high
likelihood of a successful procedure. We discussed the risks of the
procedure including infection, bleeding, tissue injury, clip
migration, and inadequate sampling. Informed written consent was
given. The usual time out protocol was performed immediately prior
to the procedure.

Biopsy #1: Anterior extent of upper outer quadrant calcifications.

Using sterile technique and 1% Lidocaine as local anesthetic, under
stereotactic guidance, a 9 gauge vacuum assisted device was used to
perform core needle biopsy of calcifications in the upper outer
quadrant of the right breast using a lateral approach. Specimen
radiograph was performed showing multiple calcifications for which
biopsy was performed. Specimens with calcifications are identified
for pathology.

Lesion quadrant: Upper outer quadrant

At the conclusion of the procedure, a coil shaped shaped tissue
marker clip was deployed into the biopsy cavity.

Biopsy #2: Posterior extent of upper outer quadrant calcifications.

Using sterile technique and 1% Lidocaine as local anesthetic, under
stereotactic guidance, a 9 gauge vacuum assisted device was used to
perform core needle biopsy of calcifications in the upper outer
quadrant of the right breast using a lateral approach. Specimen
radiograph was performed showing multiple calcifications for which
biopsy was performed. Specimens with calcifications are identified
for pathology.

Lesion quadrant: Upper outer quadrant

At the conclusion of the procedure, a ribbon shaped tissue marker
clip was deployed into the biopsy cavity.

Ultrasound-guided biopsy.

I met with the patient and we discussed the procedure of
ultrasound-guided biopsy, including benefits and alternatives. We
discussed the high likelihood of a successful procedure. We
discussed the risks of the procedure, including infection, bleeding,
tissue injury, clip migration, and inadequate sampling. Informed
written consent was given. The usual time-out protocol was performed
immediately prior to the procedure.

Lesion quadrant: Upper outer quadrant, bordering the anterior
inferior right axilla.

Using sterile technique and 1% Lidocaine as local anesthetic, under
direct ultrasound visualization, a 14 gauge HELMER device was
used to perform biopsy of the abnormal lymph node in the far upper
outer quadrant of the right breast/anterior inferior right axilla
using an inferior approach. At the conclusion of the procedure a
HydroMARK tissue marker clip was deployed into the biopsy cavity.

Follow-up 2-view mammogram was performed and dictated separately.
IMPRESSION: Stereotactic-guided biopsy of the anterior and posterior extents of
suspicious right breast calcifications. Ultrasound-guided core
needle biopsy of 1 of 2 abnormal right axillary lymph nodes. No
apparent complications.

ADDENDUM:
PATHOLOGY revealed: Site A. RIGHT BREAST, ANTERIOR EXTENT
CALCIFICATIONS; STEREOTACTIC BIOPSY: - INVASIVE MAMMARY CARCINOMA,
NO SPECIAL TYPE. Size of invasive carcinoma: 15 mm in this sample.
Grade 3. Ductal carcinoma in situ: Present, high-grade with
comedonecrosis. Lymphovascular invasion: Not identified.

Pathology results are CONCORDANT with imaging findings, per Dr.
HELMER.

PATHOLOGY revealed: Site B. RIGHT BREAST, POSTERIOR EXTENT
CALCIFICATIONS; STEREOTACTIC BIOPSY: - INVASIVE MAMMARY CARCINOMA,
AS DESCRIBED ABOVE, AT LEAST 17 MM.

Pathology results are CONCORDANT with imaging findings, per Dr.
HELMER.

PATHOLOGY revealed: Site C. LYMPH NODE, RIGHT AXILLA;
ULTRASOUND-GUIDED BIOPSY: - METASTATIC MAMMARY CARCINOMA, AT LEAST
12 MM.

Pathology results are CONCORDANT with imaging findings, per Dr.
HELMER.

Pathology results and recommendations below were discussed with
patient by provider (HELMER) via telephone on [DATE].
Provider will arrange surgical and oncological consultation for
patient. Biopsy site assessed by HELMER RN on [DATE];
patient reported biopsy site within normal limits with slight
tenderness at the site. Post biopsy care instructions were reviewed,
questions were answered and my direct phone number was provided to
patient. Patient was instructed to call [HOSPITAL] if
any concerns or questions arise related to the biopsy.

RECOMMENDATIONS: 1. Surgical and oncological consultation. HELMER
HELMER RN at [HOSPITAL] [HOSPITAL] was notified on
[DATE] that provider (Dr. HELMER) will arrange surgical
and oncological consultation, and inform patient of appointment
details.

2. Consider bilateral breast MRI to evaluate extent of breast
disease.

Pathology results reported by HELMER RN on [DATE].

*** End of Addendum ***
FINDINGS: Stereotactic biopsies

The patient and I discussed the procedure of stereotactic-guided
biopsy including benefits and alternatives. We discussed the high
likelihood of a successful procedure. We discussed the risks of the
procedure including infection, bleeding, tissue injury, clip
migration, and inadequate sampling. Informed written consent was
given. The usual time out protocol was performed immediately prior
to the procedure.

Biopsy #1: Anterior extent of upper outer quadrant calcifications.

Using sterile technique and 1% Lidocaine as local anesthetic, under
stereotactic guidance, a 9 gauge vacuum assisted device was used to
perform core needle biopsy of calcifications in the upper outer
quadrant of the right breast using a lateral approach. Specimen
radiograph was performed showing multiple calcifications for which
biopsy was performed. Specimens with calcifications are identified
for pathology.

Lesion quadrant: Upper outer quadrant

At the conclusion of the procedure, a coil shaped shaped tissue
marker clip was deployed into the biopsy cavity.

Biopsy #2: Posterior extent of upper outer quadrant calcifications.

Using sterile technique and 1% Lidocaine as local anesthetic, under
stereotactic guidance, a 9 gauge vacuum assisted device was used to
perform core needle biopsy of calcifications in the upper outer
quadrant of the right breast using a lateral approach. Specimen
radiograph was performed showing multiple calcifications for which
biopsy was performed. Specimens with calcifications are identified
for pathology.

Lesion quadrant: Upper outer quadrant

At the conclusion of the procedure, a ribbon shaped tissue marker
clip was deployed into the biopsy cavity.

Ultrasound-guided biopsy.

I met with the patient and we discussed the procedure of
ultrasound-guided biopsy, including benefits and alternatives. We
discussed the high likelihood of a successful procedure. We
discussed the risks of the procedure, including infection, bleeding,
tissue injury, clip migration, and inadequate sampling. Informed
written consent was given. The usual time-out protocol was performed
immediately prior to the procedure.

Lesion quadrant: Upper outer quadrant, bordering the anterior
inferior right axilla.

Using sterile technique and 1% Lidocaine as local anesthetic, under
direct ultrasound visualization, a 14 gauge HELMER device was
used to perform biopsy of the abnormal lymph node in the far upper
outer quadrant of the right breast/anterior inferior right axilla
using an inferior approach. At the conclusion of the procedure a
HydroMARK tissue marker clip was deployed into the biopsy cavity.

Follow-up 2-view mammogram was performed and dictated separately.
IMPRESSION: Stereotactic-guided biopsy of the anterior and posterior extents of
suspicious right breast calcifications. Ultrasound-guided core
needle biopsy of 1 of 2 abnormal right axillary lymph nodes. No
apparent complications.

## 2021-03-17 DIAGNOSIS — C50919 Malignant neoplasm of unspecified site of unspecified female breast: Secondary | ICD-10-CM

## 2021-03-21 LAB — SURGICAL PATHOLOGY

## 2021-03-25 ENCOUNTER — Encounter: Payer: Self-pay | Admitting: Oncology

## 2021-03-25 ENCOUNTER — Other Ambulatory Visit: Payer: Self-pay

## 2021-03-25 ENCOUNTER — Inpatient Hospital Stay: Payer: Managed Care, Other (non HMO)

## 2021-03-25 ENCOUNTER — Inpatient Hospital Stay: Payer: Managed Care, Other (non HMO) | Attending: Oncology | Admitting: Oncology

## 2021-03-25 ENCOUNTER — Other Ambulatory Visit: Payer: Self-pay | Admitting: *Deleted

## 2021-03-25 VITALS — BP 115/76 | HR 71 | Temp 98.1°F | Resp 18 | Wt 186.3 lb

## 2021-03-25 DIAGNOSIS — Z5111 Encounter for antineoplastic chemotherapy: Secondary | ICD-10-CM | POA: Insufficient documentation

## 2021-03-25 DIAGNOSIS — Z8052 Family history of malignant neoplasm of bladder: Secondary | ICD-10-CM | POA: Insufficient documentation

## 2021-03-25 DIAGNOSIS — Z86018 Personal history of other benign neoplasm: Secondary | ICD-10-CM | POA: Diagnosis not present

## 2021-03-25 DIAGNOSIS — C50911 Malignant neoplasm of unspecified site of right female breast: Secondary | ICD-10-CM | POA: Insufficient documentation

## 2021-03-25 DIAGNOSIS — Z7189 Other specified counseling: Secondary | ICD-10-CM

## 2021-03-25 DIAGNOSIS — Z79899 Other long term (current) drug therapy: Secondary | ICD-10-CM | POA: Diagnosis not present

## 2021-03-25 DIAGNOSIS — Z800 Family history of malignant neoplasm of digestive organs: Secondary | ICD-10-CM | POA: Insufficient documentation

## 2021-03-25 DIAGNOSIS — K8020 Calculus of gallbladder without cholecystitis without obstruction: Secondary | ICD-10-CM | POA: Diagnosis not present

## 2021-03-25 DIAGNOSIS — C50919 Malignant neoplasm of unspecified site of unspecified female breast: Secondary | ICD-10-CM | POA: Insufficient documentation

## 2021-03-25 DIAGNOSIS — K5730 Diverticulosis of large intestine without perforation or abscess without bleeding: Secondary | ICD-10-CM | POA: Diagnosis not present

## 2021-03-25 DIAGNOSIS — C50411 Malignant neoplasm of upper-outer quadrant of right female breast: Secondary | ICD-10-CM | POA: Diagnosis not present

## 2021-03-25 DIAGNOSIS — Z170 Estrogen receptor positive status [ER+]: Secondary | ICD-10-CM | POA: Diagnosis not present

## 2021-03-25 DIAGNOSIS — Z5189 Encounter for other specified aftercare: Secondary | ICD-10-CM | POA: Insufficient documentation

## 2021-03-25 DIAGNOSIS — Z803 Family history of malignant neoplasm of breast: Secondary | ICD-10-CM | POA: Diagnosis not present

## 2021-03-25 DIAGNOSIS — Z8042 Family history of malignant neoplasm of prostate: Secondary | ICD-10-CM | POA: Diagnosis not present

## 2021-03-25 DIAGNOSIS — C773 Secondary and unspecified malignant neoplasm of axilla and upper limb lymph nodes: Secondary | ICD-10-CM | POA: Diagnosis not present

## 2021-03-25 NOTE — Progress Notes (Signed)
Hematology/Oncology Consult note [facility] [phone] [phone]  Patient Care Team: Derinda Late, MD as PCP - General (Family Medicine) Christene Lye, MD (General Surgery) Rosina Lowenstein, MD (Obstetrics and Gynecology)   Name of the patient: Emma Cuevas  [id number]  1965-08-05    Reason for referral-new diagnosis of breast cancer   Referring physician-Dr. [name]  Date of visit: 03/25/21   History of presenting illness- Patient is a 55 year old female who underwent a routine bilateral screening mammogram in September 2022 which showed calcifications in the right breast and prominent right axillary lymph node concerning for malignancy.  This was followed by diagnostic mammogram and ultrasound.  Mammogram showed extensive suspicious pleomorphic calcifications measuring up to 6.8 cm.  2 abnormal lymph nodes were seen in the right axilla on ultrasound as well.  Both the calcifications and the lymph node was biopsied.  Anterior and posterior end of the calcifications was consistent with invasive mammary carcinoma grade 3.  Lymph node was positive for metastatic carcinoma as well.  ER 91 to 100% positive PR 41 to 50% positive and HER2 negative.  Menarche at the age of 47.  She is G2, P2 L2.  Age at first birth 77.  She used birth control pills for about 8 to 10 years.  She had a hysterectomy at the age of 30 but ovaries are still in situ.  Family history significant for breast cancer in her mother and paternal aunt.  Father with metastatic bladder cancer and maternal grandfather with pancreatic cancer.  Patient had BRCA 1 and 2 testing done in the past and was reportedly negative.  She does not have any significant medical problems.  ECOG PS- 0  Pain scale- 0   Review of systems- Review of Systems  Constitutional:  Negative for chills, fever, malaise/fatigue and weight loss.  HENT:  Negative for congestion, ear discharge  and nosebleeds.   Eyes:  Negative for blurred vision.  Respiratory:  Negative for cough, hemoptysis, sputum production, shortness of breath and wheezing.   Cardiovascular:  Negative for chest pain, palpitations, orthopnea and claudication.  Gastrointestinal:  Negative for abdominal pain, blood in stool, constipation, diarrhea, heartburn, melena, nausea and vomiting.  Genitourinary:  Negative for dysuria, flank pain, frequency, hematuria and urgency.  Musculoskeletal:  Negative for back pain, joint pain and myalgias.  Skin:  Negative for rash.  Neurological:  Negative for dizziness, tingling, focal weakness, seizures, weakness and headaches.  Endo/Heme/Allergies:  Does not bruise/bleed easily.  Psychiatric/Behavioral:  Negative for depression and suicidal ideas. The patient does not have insomnia.    No Known Allergies  Patient Active Problem List   Diagnosis Date Noted   Menopause 10/05/2016   Mild renal insufficiency 10/05/2016   Benign neoplasm of breast 10/27/2012   Family history of breast cancer 10/27/2012     Past Medical History:  Diagnosis Date   Benign neoplasm of breast 2013   left breast   BRCA negative 2015   Family history of malignant neoplasm of breast 2013   Screening for obesity      Past Surgical History:  Procedure Laterality Date   ABDOMINAL HYSTERECTOMY  2011   partial   BREAST BIOPSY Left 2013   BREAST BIOPSY Right 03/14/2021   Stereo bx-anterior calcs, "coil" clip-path pending   BREAST BIOPSY Right 03/14/2021   stereo bx-calcs, "Ribbon" clip-path pending   breast biopsy Right 03/14/2021   [place] Bx, Axilla, path pending   BREAST SURGERY Left 10/16/2011  left breast finesse biopsy   COLONOSCOPY WITH PROPOFOL N/A 12/05/2014   Procedure: COLONOSCOPY WITH PROPOFOL;  Surgeon: Christene Lye, MD;  Location: [facility] ENDOSCOPY;  Service: Endoscopy;  Laterality: N/A;    Social History   Socioeconomic History   Marital status: Married    Spouse  name: Not on file   Number of children: Not on file   Years of education: Not on file   Highest education level: Not on file  Occupational History   Not on file  Tobacco Use   Smoking status: Never   Smokeless tobacco: Never  Substance and Sexual Activity   Alcohol use: No   Drug use: No   Sexual activity: Not on file  Other Topics Concern   Not on file  Social History Narrative   Not on file   Social Determinants of Health   Financial Resource Strain: Not on file  Food Insecurity: Not on file  Transportation Needs: Not on file  Physical Activity: Not on file  Stress: Not on file  Social Connections: Not on file  Intimate Partner Violence: Not on file     Family History  Problem Relation Age of Onset   Breast cancer Mother 86   Bladder Cancer Father    Breast cancer Paternal Aunt 51   Cancer Maternal Grandfather        prostate     Current Outpatient Medications:    acetaminophen (TYLENOL) 500 MG tablet, Take 500 mg by mouth every 6 (six) hours as needed., Disp: , Rfl:    fluorouracil (EFUDEX) 5 % cream, Apply topically 2 (two) times daily. (Patient not taking: Reported on 04/15/2020), Disp: 40 g, Rfl: 1   metroNIDAZOLE (METROGEL) 0.75 % gel, APPLY A SMALL AMOUNT TO AFFECTED AREA AS DIRECTED QD TO BID TO FACE FOR ROSACEA, Disp: , Rfl:    Vitamin D, Cholecalciferol, 1000 UNITS CAPS, Take by mouth daily., Disp: , Rfl:    Physical exam:  Vitals:   03/25/21 1333  BP: 115/76  Pulse: 71  Resp: 18  Temp: 98.1 F (36.7 C)  TempSrc: Tympanic  SpO2: 100%  Weight: 186 lb 4.8 oz (84.5 kg)   Physical Exam Constitutional:      General: She is not in acute distress. Cardiovascular:     Rate and Rhythm: Normal rate and regular rhythm.     Heart sounds: Normal heart sounds.  Pulmonary:     Effort: Pulmonary effort is normal.     Breath sounds: Normal breath sounds.  Abdominal:     General: Bowel sounds are normal.     Palpations: Abdomen is soft.  Skin:     General: Skin is warm and dry.  Neurological:     Mental Status: She is alert and oriented to person, place, and time.    Breast exam: No palpable bilateral axillary adenopathy.  There is no palpable right breast mass.  Induration at the site of biopsy   No flowsheet data found. No flowsheet data found.  No images are attached to the encounter.  MM DIAG BREAST TOMO UNI RIGHT  Result Date: 03/04/2021 CLINICAL DATA:  55 year old female presenting as a recall from screening for right breast calcifications and a right axillary lymph node. EXAM: DIGITAL DIAGNOSTIC UNILATERAL RIGHT MAMMOGRAM WITH TOMOSYNTHESIS AND CAD; [place] AXILLARY RIGHT TECHNIQUE: Right digital diagnostic mammography and breast tomosynthesis was performed. The images were evaluated with computer-aided detection.; Targeted ultrasound examination of the right axilla was performed. COMPARISON:  Previous exam(s). ACR Breast Density  Category c: The breast tissue is heterogeneously dense, which may obscure small masses. FINDINGS: Mammogram: Spot 2D magnification views of the right breast were performed demonstrating persistence of extensive pleomorphic calcifications throughout the upper-outer quadrant of the right breast spanning at least 6.8 cm. There is a possible distortion associated with the calcifications on the full field mL view (image 67/90). Spot compression tomosynthesis views of the right axilla were performed demonstrating persistence of at least 1 lymph node with cortical thickening. Ultrasound: Targeted ultrasound performed in the right axilla demonstrating 2 abnormal lymph nodes with cortical thickness measuring up to 0.7 cm. IMPRESSION: 1. Extensive suspicious pleomorphic calcifications involving the upper outer right breast measuring up to 6.8 cm. There is a possible distortion associated with the calcifications. 2.  Suspicious right axillary lymph nodes (2). RECOMMENDATION: 1. Stereotactic core needle biopsy x2 targeting the  anterior and posterior aspect of the calcifications. Recommend targeting the possible area of distortion with calcifications at the anterior aspect. 2. Ultrasound-guided core needle biopsy of a right axillary lymph node. I have discussed the findings and recommendations with the patient who agrees to proceed with biopsy. The patient will be scheduled for the biopsy appointment prior to leaving the office today. BI-RADS CATEGORY  4: Suspicious. Electronically Signed   By: Audie Pinto M.D.   On: 03/04/2021 16:05  [place] AXILLA RIGHT  Result Date: 03/04/2021 CLINICAL DATA:  55 year old female presenting as a recall from screening for right breast calcifications and a right axillary lymph node. EXAM: DIGITAL DIAGNOSTIC UNILATERAL RIGHT MAMMOGRAM WITH TOMOSYNTHESIS AND CAD; [place] AXILLARY RIGHT TECHNIQUE: Right digital diagnostic mammography and breast tomosynthesis was performed. The images were evaluated with computer-aided detection.; Targeted ultrasound examination of the right axilla was performed. COMPARISON:  Previous exam(s). ACR Breast Density Category c: The breast tissue is heterogeneously dense, which may obscure small masses. FINDINGS: Mammogram: Spot 2D magnification views of the right breast were performed demonstrating persistence of extensive pleomorphic calcifications throughout the upper-outer quadrant of the right breast spanning at least 6.8 cm. There is a possible distortion associated with the calcifications on the full field mL view (image 67/90). Spot compression tomosynthesis views of the right axilla were performed demonstrating persistence of at least 1 lymph node with cortical thickening. Ultrasound: Targeted ultrasound performed in the right axilla demonstrating 2 abnormal lymph nodes with cortical thickness measuring up to 0.7 cm. IMPRESSION: 1. Extensive suspicious pleomorphic calcifications involving the upper outer right breast measuring up to 6.8 cm. There is a possible distortion  associated with the calcifications. 2.  Suspicious right axillary lymph nodes (2). RECOMMENDATION: 1. Stereotactic core needle biopsy x2 targeting the anterior and posterior aspect of the calcifications. Recommend targeting the possible area of distortion with calcifications at the anterior aspect. 2. Ultrasound-guided core needle biopsy of a right axillary lymph node. I have discussed the findings and recommendations with the patient who agrees to proceed with biopsy. The patient will be scheduled for the biopsy appointment prior to leaving the office today. BI-RADS CATEGORY  4: Suspicious. Electronically Signed   By: Audie Pinto M.D.   On: 03/04/2021 16:05  MM CLIP PLACEMENT RIGHT  Result Date: 03/14/2021 CLINICAL DATA:  Evaluate post biopsy marker clip placement following stereotactic core needle biopsy of the anterior-posterior extents of right breast calcifications and ultrasound-guided core needle biopsy of a right axillary lymph node. EXAM: 3D DIAGNOSTIC RIGHT MAMMOGRAM POST STEREOTACTIC AND ULTRASOUND BIOPSY COMPARISON:  Previous exam(s). FINDINGS: 3D Mammographic images were obtained following stereotactic and ultrasound guided biopsy of  the anterior-posterior extents of right breast calcifications and an abnormal right axillary lymph node. The biopsy marking clips for the calcifications are well positioned. The [facility] biopsy clip projects 1.5 cm anterior to the biopsied lymph node. IMPRESSION: Appropriate positioning of the coil and ribbon shaped biopsy marking clip at the site of biopsy in the upper outer quadrant of the right breast adjacent to residual calcifications. HydroMARK biopsy clip lies 1.5 cm anterior to the biopsied right axillary lymph node. Final Assessment: Post Procedure Mammograms for Marker Placement Electronically Signed   By: Lajean Manes M.D.   On: 03/14/2021 09:43  MM RT BREAST BX W LOC DEV 1ST LESION IMAGE BX SPEC STEREO GUIDE  Addendum Date: 03/17/2021    ADDENDUM REPORT: 03/17/2021 13:01 ADDENDUM: PATHOLOGY revealed: Site A. RIGHT BREAST, ANTERIOR EXTENT CALCIFICATIONS; STEREOTACTIC BIOPSY: - INVASIVE MAMMARY CARCINOMA, NO SPECIAL TYPE. Size of invasive carcinoma: 15 mm in this sample. Grade 3. Ductal carcinoma in situ: Present, high-grade with comedonecrosis. Lymphovascular invasion: Not identified. Pathology results are CONCORDANT with imaging findings, per Dr. Lajean Manes. PATHOLOGY revealed: Site B. RIGHT BREAST, POSTERIOR EXTENT CALCIFICATIONS; STEREOTACTIC BIOPSY: - INVASIVE MAMMARY CARCINOMA, AS DESCRIBED ABOVE, AT LEAST 17 MM. Pathology results are CONCORDANT with imaging findings, per Dr. Lajean Manes. PATHOLOGY revealed: Site C. LYMPH NODE, RIGHT AXILLA; ULTRASOUND-GUIDED BIOPSY: - METASTATIC MAMMARY CARCINOMA, AT LEAST 12 MM. Pathology results are CONCORDANT with imaging findings, per Dr. Lajean Manes. Pathology results and recommendations below were discussed with patient by provider Derinda Late) via telephone on 03/17/2021. Provider will arrange surgical and oncological consultation for patient. Biopsy site assessed by Electa Sniff RN on 03/17/2021; patient reported biopsy site within normal limits with slight tenderness at the site. Post biopsy care instructions were reviewed, questions were answered and my direct phone number was provided to patient. Patient was instructed to call [facility] if any concerns or questions arise related to the biopsy. RECOMMENDATIONS: 1. Surgical and oncological consultation. Al Pimple RN at [facility] was notified on 03/17/2021 that provider (Dr. Derinda Late) will arrange surgical and oncological consultation, and inform patient of appointment details. 2. Consider bilateral breast MRI to evaluate extent of breast disease. Pathology results reported by Electa Sniff RN on 03/17/2021. Electronically Signed   By: Lajean Manes M.D.   On: 03/17/2021 13:01   Result Date:  03/17/2021 CLINICAL DATA:  Patient presents for stereotactic core needle biopsy of the anterior-posterior extents of suspicious right breast calcifications as well as an ultrasound-guided core needle biopsy of 1 of 2 abnormal right axillary lymph nodes. EXAM: RIGHT BREAST STEREOTACTIC CORE NEEDLE BIOPSY: 2 BIOPSIES PERFORMED. RIGHT AXILLARY LYMPH NODE ULTRASOUND-GUIDED CORE NEEDLE BIOPSY COMPARISON:  Previous exams. FINDINGS: Stereotactic biopsies The patient and I discussed the procedure of stereotactic-guided biopsy including benefits and alternatives. We discussed the high likelihood of a successful procedure. We discussed the risks of the procedure including infection, bleeding, tissue injury, clip migration, and inadequate sampling. Informed written consent was given. The usual time out protocol was performed immediately prior to the procedure. Biopsy #1: Anterior extent of upper outer quadrant calcifications. Using sterile technique and 1% Lidocaine as local anesthetic, under stereotactic guidance, a 9 gauge vacuum assisted device was used to perform core needle biopsy of calcifications in the upper outer quadrant of the right breast using a lateral approach. Specimen radiograph was performed showing multiple calcifications for which biopsy was performed. Specimens with calcifications are identified for pathology. Lesion quadrant: Upper outer quadrant At the conclusion of the procedure,  a coil shaped shaped tissue marker clip was deployed into the biopsy cavity. Biopsy #2: Posterior extent of upper outer quadrant calcifications. Using sterile technique and 1% Lidocaine as local anesthetic, under stereotactic guidance, a 9 gauge vacuum assisted device was used to perform core needle biopsy of calcifications in the upper outer quadrant of the right breast using a lateral approach. Specimen radiograph was performed showing multiple calcifications for which biopsy was performed. Specimens with calcifications are  identified for pathology. Lesion quadrant: Upper outer quadrant At the conclusion of the procedure, a ribbon shaped tissue marker clip was deployed into the biopsy cavity. Ultrasound-guided biopsy. I met with the patient and we discussed the procedure of ultrasound-guided biopsy, including benefits and alternatives. We discussed the high likelihood of a successful procedure. We discussed the risks of the procedure, including infection, bleeding, tissue injury, clip migration, and inadequate sampling. Informed written consent was given. The usual time-out protocol was performed immediately prior to the procedure. Lesion quadrant: Upper outer quadrant, bordering the anterior inferior right axilla. Using sterile technique and 1% Lidocaine as local anesthetic, under direct ultrasound visualization, a 14 gauge spring-loaded device was used to perform biopsy of the abnormal lymph node in the far upper outer quadrant of the right breast/anterior inferior right axilla using an inferior approach. At the conclusion of the procedure a HydroMARK tissue marker clip was deployed into the biopsy cavity. Follow-up 2-view mammogram was performed and dictated separately. IMPRESSION: Stereotactic-guided biopsy of the anterior and posterior extents of suspicious right breast calcifications. Ultrasound-guided core needle biopsy of 1 of 2 abnormal right axillary lymph nodes. No apparent complications. Electronically Signed: By: Lajean Manes M.D. On: 03/14/2021 09:31  MM RT BREAST BX W LOC DEV EA AD LESION IMG BX SPEC STEREO GUIDE  Addendum Date: 03/17/2021   ADDENDUM REPORT: 03/17/2021 13:01 ADDENDUM: PATHOLOGY revealed: Site A. RIGHT BREAST, ANTERIOR EXTENT CALCIFICATIONS; STEREOTACTIC BIOPSY: - INVASIVE MAMMARY CARCINOMA, NO SPECIAL TYPE. Size of invasive carcinoma: 15 mm in this sample. Grade 3. Ductal carcinoma in situ: Present, high-grade with comedonecrosis. Lymphovascular invasion: Not identified. Pathology results are  CONCORDANT with imaging findings, per Dr. Lajean Manes. PATHOLOGY revealed: Site B. RIGHT BREAST, POSTERIOR EXTENT CALCIFICATIONS; STEREOTACTIC BIOPSY: - INVASIVE MAMMARY CARCINOMA, AS DESCRIBED ABOVE, AT LEAST 17 MM. Pathology results are CONCORDANT with imaging findings, per Dr. Lajean Manes. PATHOLOGY revealed: Site C. LYMPH NODE, RIGHT AXILLA; ULTRASOUND-GUIDED BIOPSY: - METASTATIC MAMMARY CARCINOMA, AT LEAST 12 MM. Pathology results are CONCORDANT with imaging findings, per Dr. Lajean Manes. Pathology results and recommendations below were discussed with patient by provider Derinda Late) via telephone on 03/17/2021. Provider will arrange surgical and oncological consultation for patient. Biopsy site assessed by Electa Sniff RN on 03/17/2021; patient reported biopsy site within normal limits with slight tenderness at the site. Post biopsy care instructions were reviewed, questions were answered and my direct phone number was provided to patient. Patient was instructed to call [facility] if any concerns or questions arise related to the biopsy. RECOMMENDATIONS: 1. Surgical and oncological consultation. Al Pimple RN at [facility] was notified on 03/17/2021 that provider (Dr. Derinda Late) will arrange surgical and oncological consultation, and inform patient of appointment details. 2. Consider bilateral breast MRI to evaluate extent of breast disease. Pathology results reported by Electa Sniff RN on 03/17/2021. Electronically Signed   By: Lajean Manes M.D.   On: 03/17/2021 13:01   Result Date: 03/17/2021 CLINICAL DATA:  Patient presents for stereotactic core needle biopsy of the anterior-posterior  extents of suspicious right breast calcifications as well as an ultrasound-guided core needle biopsy of 1 of 2 abnormal right axillary lymph nodes. EXAM: RIGHT BREAST STEREOTACTIC CORE NEEDLE BIOPSY: 2 BIOPSIES PERFORMED. RIGHT AXILLARY LYMPH NODE ULTRASOUND-GUIDED CORE NEEDLE  BIOPSY COMPARISON:  Previous exams. FINDINGS: Stereotactic biopsies The patient and I discussed the procedure of stereotactic-guided biopsy including benefits and alternatives. We discussed the high likelihood of a successful procedure. We discussed the risks of the procedure including infection, bleeding, tissue injury, clip migration, and inadequate sampling. Informed written consent was given. The usual time out protocol was performed immediately prior to the procedure. Biopsy #1: Anterior extent of upper outer quadrant calcifications. Using sterile technique and 1% Lidocaine as local anesthetic, under stereotactic guidance, a 9 gauge vacuum assisted device was used to perform core needle biopsy of calcifications in the upper outer quadrant of the right breast using a lateral approach. Specimen radiograph was performed showing multiple calcifications for which biopsy was performed. Specimens with calcifications are identified for pathology. Lesion quadrant: Upper outer quadrant At the conclusion of the procedure, a coil shaped shaped tissue marker clip was deployed into the biopsy cavity. Biopsy #2: Posterior extent of upper outer quadrant calcifications. Using sterile technique and 1% Lidocaine as local anesthetic, under stereotactic guidance, a 9 gauge vacuum assisted device was used to perform core needle biopsy of calcifications in the upper outer quadrant of the right breast using a lateral approach. Specimen radiograph was performed showing multiple calcifications for which biopsy was performed. Specimens with calcifications are identified for pathology. Lesion quadrant: Upper outer quadrant At the conclusion of the procedure, a ribbon shaped tissue marker clip was deployed into the biopsy cavity. Ultrasound-guided biopsy. I met with the patient and we discussed the procedure of ultrasound-guided biopsy, including benefits and alternatives. We discussed the high likelihood of a successful procedure. We  discussed the risks of the procedure, including infection, bleeding, tissue injury, clip migration, and inadequate sampling. Informed written consent was given. The usual time-out protocol was performed immediately prior to the procedure. Lesion quadrant: Upper outer quadrant, bordering the anterior inferior right axilla. Using sterile technique and 1% Lidocaine as local anesthetic, under direct ultrasound visualization, a 14 gauge spring-loaded device was used to perform biopsy of the abnormal lymph node in the far upper outer quadrant of the right breast/anterior inferior right axilla using an inferior approach. At the conclusion of the procedure a HydroMARK tissue marker clip was deployed into the biopsy cavity. Follow-up 2-view mammogram was performed and dictated separately. IMPRESSION: Stereotactic-guided biopsy of the anterior and posterior extents of suspicious right breast calcifications. Ultrasound-guided core needle biopsy of 1 of 2 abnormal right axillary lymph nodes. No apparent complications. Electronically Signed: By: Lajean Manes M.D. On: 03/14/2021 09:31  [place] RT BREAST BX W LOC DEV 1ST LESION IMG BX SPEC US GUIDE  Addendum Date: 03/17/2021   ADDENDUM REPORT: 03/17/2021 13:01 ADDENDUM: PATHOLOGY revealed: Site A. RIGHT BREAST, ANTERIOR EXTENT CALCIFICATIONS; STEREOTACTIC BIOPSY: - INVASIVE MAMMARY CARCINOMA, NO SPECIAL TYPE. Size of invasive carcinoma: 15 mm in this sample. Grade 3. Ductal carcinoma in situ: Present, high-grade with comedonecrosis. Lymphovascular invasion: Not identified. Pathology results are CONCORDANT with imaging findings, per Dr. Lajean Manes. PATHOLOGY revealed: Site B. RIGHT BREAST, POSTERIOR EXTENT CALCIFICATIONS; STEREOTACTIC BIOPSY: - INVASIVE MAMMARY CARCINOMA, AS DESCRIBED ABOVE, AT LEAST 17 MM. Pathology results are CONCORDANT with imaging findings, per Dr. Lajean Manes. PATHOLOGY revealed: Site C. LYMPH NODE, RIGHT AXILLA; ULTRASOUND-GUIDED BIOPSY: - METASTATIC  MAMMARY CARCINOMA, AT LEAST  12 MM. Pathology results are CONCORDANT with imaging findings, per Dr. Lajean Manes. Pathology results and recommendations below were discussed with patient by provider Derinda Late) via telephone on 03/17/2021. Provider will arrange surgical and oncological consultation for patient. Biopsy site assessed by Electa Sniff RN on 03/17/2021; patient reported biopsy site within normal limits with slight tenderness at the site. Post biopsy care instructions were reviewed, questions were answered and my direct phone number was provided to patient. Patient was instructed to call [facility] if any concerns or questions arise related to the biopsy. RECOMMENDATIONS: 1. Surgical and oncological consultation. Al Pimple RN at [facility] [facility] was notified on 03/17/2021 that provider (Dr. Derinda Late) will arrange surgical and oncological consultation, and inform patient of appointment details. 2. Consider bilateral breast MRI to evaluate extent of breast disease. Pathology results reported by Electa Sniff RN on 03/17/2021. Electronically Signed   By: Lajean Manes M.D.   On: 03/17/2021 13:01   Result Date: 03/17/2021 CLINICAL DATA:  Patient presents for stereotactic core needle biopsy of the anterior-posterior extents of suspicious right breast calcifications as well as an ultrasound-guided core needle biopsy of 1 of 2 abnormal right axillary lymph nodes. EXAM: RIGHT BREAST STEREOTACTIC CORE NEEDLE BIOPSY: 2 BIOPSIES PERFORMED. RIGHT AXILLARY LYMPH NODE ULTRASOUND-GUIDED CORE NEEDLE BIOPSY COMPARISON:  Previous exams. FINDINGS: Stereotactic biopsies The patient and I discussed the procedure of stereotactic-guided biopsy including benefits and alternatives. We discussed the high likelihood of a successful procedure. We discussed the risks of the procedure including infection, bleeding, tissue injury, clip migration, and inadequate sampling. Informed written consent was  given. The usual time out protocol was performed immediately prior to the procedure. Biopsy #1: Anterior extent of upper outer quadrant calcifications. Using sterile technique and 1% Lidocaine as local anesthetic, under stereotactic guidance, a 9 gauge vacuum assisted device was used to perform core needle biopsy of calcifications in the upper outer quadrant of the right breast using a lateral approach. Specimen radiograph was performed showing multiple calcifications for which biopsy was performed. Specimens with calcifications are identified for pathology. Lesion quadrant: Upper outer quadrant At the conclusion of the procedure, a coil shaped shaped tissue marker clip was deployed into the biopsy cavity. Biopsy #2: Posterior extent of upper outer quadrant calcifications. Using sterile technique and 1% Lidocaine as local anesthetic, under stereotactic guidance, a 9 gauge vacuum assisted device was used to perform core needle biopsy of calcifications in the upper outer quadrant of the right breast using a lateral approach. Specimen radiograph was performed showing multiple calcifications for which biopsy was performed. Specimens with calcifications are identified for pathology. Lesion quadrant: Upper outer quadrant At the conclusion of the procedure, a ribbon shaped tissue marker clip was deployed into the biopsy cavity. Ultrasound-guided biopsy. I met with the patient and we discussed the procedure of ultrasound-guided biopsy, including benefits and alternatives. We discussed the high likelihood of a successful procedure. We discussed the risks of the procedure, including infection, bleeding, tissue injury, clip migration, and inadequate sampling. Informed written consent was given. The usual time-out protocol was performed immediately prior to the procedure. Lesion quadrant: Upper outer quadrant, bordering the anterior inferior right axilla. Using sterile technique and 1% Lidocaine as local anesthetic, under  direct ultrasound visualization, a 14 gauge spring-loaded device was used to perform biopsy of the abnormal lymph node in the far upper outer quadrant of the right breast/anterior inferior right axilla using an inferior approach. At the conclusion of the procedure a HydroMARK tissue  marker clip was deployed into the biopsy cavity. Follow-up 2-view mammogram was performed and dictated separately. IMPRESSION: Stereotactic-guided biopsy of the anterior and posterior extents of suspicious right breast calcifications. Ultrasound-guided core needle biopsy of 1 of 2 abnormal right axillary lymph nodes. No apparent complications. Electronically Signed: By: Lajean Manes M.D. On: 03/14/2021 09:31   Assessment and plan- Patient is a 55 y.o. 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  She was noted to have a span of 6.8 cm calcifications in her right breast 1 year later on her screening mammogram.  Both anterior and posterior end of calcifications is significant for invasive mammary carcinoma grade 3.  There were also 2 concerning lymph nodes in the right axilla 1 of which was biopsied and consistent with metastatic mammary carcinoma.  There is no well-defined breast mass but the entire 6.8 cm area appears concerning for invasive breast cancer making this a T3 lesion.  She is already met with Dr. Peyton Najjar who discussed about lumpectomy versus mastectomy and did say that lumpectomy could still be an potential option for her.  I explained to her the pathology findings in detail which shows that this is a grade 3 invasive mammary carcinoma.  That indicates a more aggressive histology.  She was strongly positive for estrogen 90-100%, PR 41 to 50% and HER2 negative.  I will also check Ki-67 on her biopsy specimen.  Given that she has at  least stage IIIa disease based on her imaging and relatively rapid development of calcifications and lymphadenopathy over the period of 1 year-I would recommend a PET CT scan to complete her staging work-up  We will also check a bilateral breast MRI  I would recommend neoadjuvant chemotherapy with dose dense anthracycline plus Cytoxan given IV every 2 weeks for 4 cycles followed by weekly Taxol chemotherapy for 12 cycles.  Discussed risks and benefits of chemotherapy including all but not limited to nausea, vomiting, low blood counts, risk of infections and hospitalization.  Risk of infusion reaction and peripheral neuropathy associated with Taxol.  Risk of cardiotoxicity associated with anthracycline and the need to get a baseline echocardiogram prior to start of chemotherapy.  We will proceed with this regimen if there is no evidence of metastatic disease noted on PET/CT scan.  Treatment will be given with a curative intent in the absence of metastatic disease.  Patient understands and agrees to proceed as planned.  Patient will already undergo port placement by Dr. Peyton Najjar.  Patient has had a negative BRCA 1 and 2 testing in the past but I will refer her to genetic counseling for a more comprehensive genetic testing.  Typically ER/PR positive and HER2 negative tumors do not respond very well to chemotherapy but given her grade 3 histology she may still have a good response although she may not achieve a pathological complete response.  Patient would benefit from adjuvant radiation treatment upon definitive surgery but she will need neoadjuvant chemotherapy first.  Also her tumor is ER/PR positive and there would be role for hormone therapy upon completion of radiation treatment which I will discuss with her in greater detail down the line.  Patient and her husband had multiple insightful questions and all of them were answered to their satisfaction.  I will tentatively see her back in 2 weeks  after PET  CT scan and MRI and port placement as well as echocardiogram to start with cycle 1 of dose dense AC chemotherapy.  I will check her baseline hormone levels including estradiol LH and FSH at that time.  Cancer Staging Breast cancer, stage 1, estrogen receptor positive, right ([place]) Staging form: Breast, AJCC 8th Edition - Clinical stage from 03/25/2021: Stage IIIA (cT3, cN1, cM0, G3, ER+, PR+, HER2-) - Signed by Sindy Guadeloupe, MD on 03/25/2021 Histologic grading system: 3 grade system    Thank you for this kind referral and the opportunity to participate in the care of this patient   Visit Diagnosis 1. Breast cancer, stage 1, estrogen receptor positive, right ([place])   2. Goals of care, counseling/discussion     Dr. Randa Evens, MD, MPH [facility] [phone] 03/25/2021

## 2021-03-27 ENCOUNTER — Other Ambulatory Visit: Payer: Self-pay

## 2021-03-27 ENCOUNTER — Ambulatory Visit
Admission: RE | Admit: 2021-03-27 | Discharge: 2021-03-27 | Disposition: A | Discharge status: Home / Self Care | Payer: Managed Care, Other (non HMO) | Source: Ambulatory Visit | Attending: Oncology | Admitting: Oncology

## 2021-03-27 DIAGNOSIS — C50919 Malignant neoplasm of unspecified site of unspecified female breast: Secondary | ICD-10-CM | POA: Insufficient documentation

## 2021-03-27 IMAGING — MR MR BREAST BILAT WO/W CM
2 of 13 series · 6 of 48 positions shown · IV contrast (8ml Gadavist)
Comparison: Recent mammogram, ultrasound and biopsy examinations.

CLINICAL DATA: Recently biopsied extensive calcifications
throughout the upper-outer quadrant of the right breast with
pathology results of invasive mammary carcinoma and high-grade DCIS.
The patient also had a right axillary lymph node biopsy
demonstrating metastatic mammary carcinoma.

LABS:  None obtained on site today.
EXAM:
BILATERAL BREAST MRI WITH AND WITHOUT CONTRAST
TECHNIQUE: Multiplanar, multisequence MR images of both breasts were obtained
prior to and following the intravenous administration of 8 ml of
Gadavist

[Series 3: T1 · axial · B · 1.5mm · 1.02mm/px · z∈[-65,+101]mm · 5 of 112 slices shown]
[im 1/112]
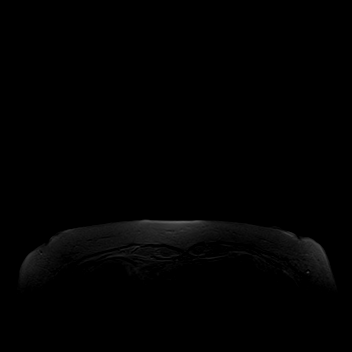
[im 28/112]
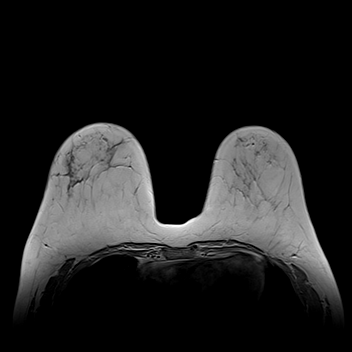
[im 56/112]
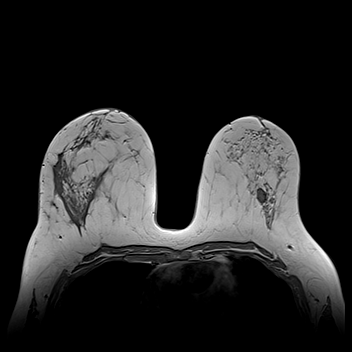
[im 84/112]
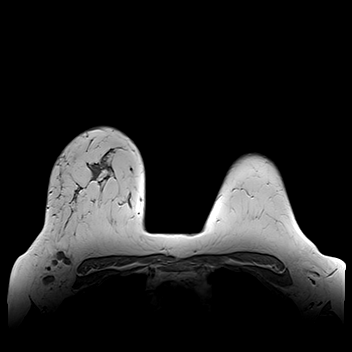
[im 112/112]
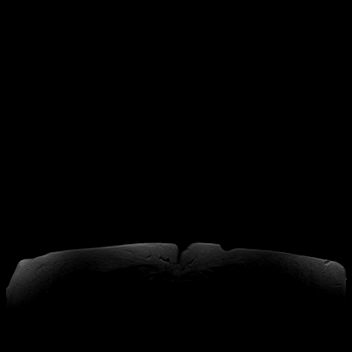

[Series 4: T2 · axial · B · 3.0mm · 1.02mm/px · 1 of 44 slices shown]
[im 1/44]
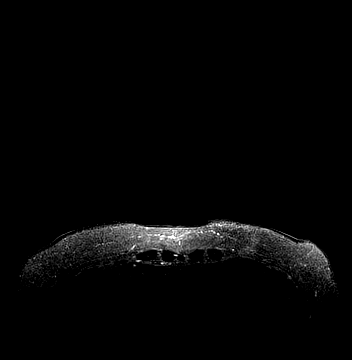

[6 of 48 positions shown; findings below may reference images not displayed]

Three-dimensional MR images were rendered by post-processing of the
original MR data on an independent workstation. The
three-dimensional MR images were interpreted, and findings are
reported in the following complete MRI report for this study. Three
dimensional images were evaluated at the independent interpreting
workstation using the DynaCAD thin client.
FINDINGS: Breast composition: c. Heterogeneous fibroglandular tissue.

Background parenchymal enhancement: Minimal

Right breast: Extensive non mass enhancement extending from the
nipple to the posterior aspects of the breast involving the upper
outer, lower outer and upper inner quadrants. This spans an area
measuring 10.9 x 8.4 x 6.8 cm and corresponds to the recently
biopsied invasive mammary carcinoma and DCIS. This has a mixture of
enhancement kinetics, including rapid wash-in/washout.

Left breast: Rounded similar area non mass enhancement in the
slightly lower outer quadrant, spanning 1.6 x 1.3 x 0.8 cm. This has
enhancement kinetics similar to the biopsy-proven malignancy on the
right.

Lymph nodes: 6 right axillary lymph nodes with abnormal cortical
thickening, including the recently biopsied lymph. No abnormal
axillary lymph nodes on the left and no abnormal intramammary lymph
nodes on either side.

Ancillary findings:  None.
IMPRESSION: 1. 10.9 x 8 4 x 6.8 cm area of biopsy-proven malignancy the
extending from the anterior to the posterior aspects of the right
breast, involving 3 quadrants.
2. 1.6 x 1.3 x 0.8 cm similar area in the slightly lower outer
quadrant of the left breast, suspicious for malignancy.
3. 6 metastatic right axillary lymph nodes.

RECOMMENDATION:
MR guided core needle biopsy of the 1.6 cm area of non mass
enhancement in the lower outer quadrant of the left breast.

BI-RADS CATEGORY  4: Suspicious.

## 2021-03-27 MED ORDER — GADOBUTROL 1 MMOL/ML IV SOLN
8.0000 mL | Freq: Once | INTRAVENOUS | Status: AC | PRN
  Administered 2021-03-27: 8 mL via INTRAVENOUS

## 2021-03-28 ENCOUNTER — Inpatient Hospital Stay: Payer: Managed Care, Other (non HMO)

## 2021-03-31 ENCOUNTER — Ambulatory Visit
Admission: RE | Admit: 2021-03-31 | Discharge: 2021-03-31 | Disposition: A | Discharge status: Home / Self Care | Payer: Managed Care, Other (non HMO) | Source: Ambulatory Visit | Attending: Oncology | Admitting: Oncology

## 2021-03-31 ENCOUNTER — Other Ambulatory Visit: Payer: Self-pay | Admitting: *Deleted

## 2021-03-31 ENCOUNTER — Ambulatory Visit: Payer: Self-pay | Admitting: General Surgery

## 2021-03-31 ENCOUNTER — Ambulatory Visit: Payer: Managed Care, Other (non HMO)

## 2021-03-31 ENCOUNTER — Other Ambulatory Visit: Payer: Self-pay

## 2021-03-31 ENCOUNTER — Other Ambulatory Visit
Admission: RE | Admit: 2021-03-31 | Discharge: 2021-03-31 | Disposition: A | Discharge status: Home / Self Care | Payer: Managed Care, Other (non HMO) | Source: Ambulatory Visit | Attending: General Surgery | Admitting: General Surgery

## 2021-03-31 DIAGNOSIS — C50919 Malignant neoplasm of unspecified site of unspecified female breast: Secondary | ICD-10-CM

## 2021-03-31 DIAGNOSIS — Z0189 Encounter for other specified special examinations: Secondary | ICD-10-CM | POA: Diagnosis not present

## 2021-03-31 DIAGNOSIS — N289 Disorder of kidney and ureter, unspecified: Secondary | ICD-10-CM | POA: Insufficient documentation

## 2021-03-31 DIAGNOSIS — I517 Cardiomegaly: Secondary | ICD-10-CM | POA: Diagnosis not present

## 2021-03-31 DIAGNOSIS — Z01818 Encounter for other preprocedural examination: Secondary | ICD-10-CM | POA: Diagnosis not present

## 2021-03-31 HISTORY — DX: Anemia, unspecified: D64.9

## 2021-03-31 HISTORY — DX: Malignant (primary) neoplasm, unspecified: C80.1

## 2021-03-31 LAB — ECHOCARDIOGRAM COMPLETE
AR max vel: 2.09 cm2
AV Area VTI: 2.34 cm2
AV Area mean vel: 2.11 cm2
AV Mean grad: 2.5 mmHg
AV Peak grad: 5.3 mmHg
Ao pk vel: 1.15 m/s
Area-P 1/2: 3.6 cm2
MV VTI: 2.7 cm2
S' Lateral: 2.45 cm

## 2021-03-31 NOTE — Patient Instructions (Addendum)
Your procedure is scheduled on: 04/02/21 - Wednesday Report to the Registration Desk on the 1st floor of the [facility]. To find out your arrival time, please call [phone] between 1PM - 3PM on: 04/01/21 - Tuesday  REMEMBER: Instructions that are not followed completely may result in serious medical risk, up to and including death; or upon the discretion of your surgeon and anesthesiologist your surgery may need to be rescheduled.  Do not eat food after midnight the night before surgery.  No gum chewing, lozengers or hard candies.  You may however, drink CLEAR liquids up to 2 hours before you are scheduled to arrive for your surgery. Do not drink anything within 2 hours of your scheduled arrival time.  Clear liquids include: - water  - apple juice without pulp - gatorade (not RED, PURPLE, OR BLUE) - black coffee or tea (Do NOT add milk or creamers to the coffee or tea) Do NOT drink anything that is not on this list.  TAKE THESE MEDICATIONS THE MORNING OF SURGERY WITH A SIP OF WATER: None  One week prior to surgery: Stop Anti-inflammatories (NSAIDS) such as Advil, Aleve, Ibuprofen, Motrin, Naproxen, Naprosyn and Aspirin based products such as Excedrin, Goodys Powder, BC Powder.  Stop ANY OVER THE COUNTER supplements until after surgery.  You may however, continue to take Tylenol if needed for pain up until the day of surgery.  No Alcohol for 24 hours before or after surgery.  No Smoking including e-cigarettes for 24 hours prior to surgery.  No chewable tobacco products for at least 6 hours prior to surgery.  No nicotine patches on the day of surgery.  Do not use any "recreational" drugs for at least a week prior to your surgery.  Please be advised that the combination of cocaine and anesthesia may have negative outcomes, up to and including death. If you test positive for cocaine, your surgery will be cancelled.  On the morning of surgery brush your teeth with toothpaste  and water, you may rinse your mouth with mouthwash if you wish. Do not swallow any toothpaste or mouthwash.  Do not wear jewelry, make-up, hairpins, clips or nail polish.  Take a shower/Bath before your procedure.  Do not wear lotions, powders, or perfumes.   Do not shave body from the neck down 48 hours prior to surgery just in case you cut yourself which could leave a site for infection.  Also, freshly shaved skin may become irritated if using the CHG soap.  Contact lenses, hearing aids and dentures may not be worn into surgery.  Do not bring valuables to the hospital. [facility] is not responsible for any missing/lost belongings or valuables.   Notify your doctor if there is any change in your medical condition (cold, fever, infection).  Wear comfortable clothing (specific to your surgery type) to the hospital.  After surgery, you can help prevent lung complications by doing breathing exercises.  Take deep breaths and cough every 1-2 hours. Your doctor may order a device called an Incentive Spirometer to help you take deep breaths. When coughing or sneezing, hold a pillow firmly against your incision with both hands. This is called "splinting." Doing this helps protect your incision. It also decreases belly discomfort.  If you are being admitted to the hospital overnight, leave your suitcase in the car. After surgery it may be brought to your room.  If you are being discharged the day of surgery, you will not be allowed to drive home. You  will need a responsible adult (18 years or older) to drive you home and stay with you that night.   If you are taking public transportation, you will need to have a responsible adult (18 years or older) with you. Please confirm with your physician that it is acceptable to use public transportation.   Please call the [facility] at [phone] if you have any questions about these instructions.  Surgery Visitation  Policy:  Patients undergoing a surgery or procedure may have one family member or support person with them as long as that person is not COVID-19 positive or experiencing its symptoms.  That person may remain in the waiting area during the procedure and may rotate out with other people.  Inpatient Visitation:    Visiting hours are 7 a.m. to 8 p.m. Up to two visitors ages 16+ are allowed at one time in a patient room. The visitors may rotate out with other people during the day. Visitors must check out when they leave, or other visitors will not be allowed. One designated support person may remain overnight. The visitor must pass COVID-19 screenings, use hand sanitizer when entering and exiting the patient's room and wear a mask at all times, including in the patient's room. Patients must also wear a mask when staff or their visitor are in the room. Masking is required regardless of vaccination status.

## 2021-03-31 NOTE — H&P (Signed)
PATIENT PROFILE: Emma Cuevas is a 55 y.o. female who presents to the Clinic for consultation at the request of Dr. Baldemar Cuevas for evaluation of right breast cancer.  PCP: Emma Lister, MD  HISTORY OF PRESENT ILLNESS: Emma Cuevas reports she had her usual screening mammogram. On her mammogram she was found with a significant area of calcification and distortion. This led to diagnostic mammogram. Persistent calcification or distortion was appreciated. The area of calcifications spanning up to 6.8 cm. Ultrasound also showed 2 abnormal lymph nodes. Biopsy of the anterior and posterior extent of the calcification was done and of one of the abdominal lymph nodes. Biopsy of the calcifications shows invasive mammary carcinoma. Biopsy of the lymph node shows metastatic disease from breast cancer. I personally evaluated the images of the screening mammogram, diagnostic mammogram, axillary ultrasound.  Family history of breast cancer: Mother and aunt Family history of other cancers: Grandparent with pancreatic cancer Menarche: Around 55 years old Menopause: Partial hysterectomy 12 years ago Used OCP: Yes Used estrogen and progesterone therapy: No History of Radiation to the chest: No Number of pregnancies: 4 Age of first pregnancy: 55 years old Previous biopsy: Yes left breast benign  PROBLEM LIST: Problem List Date Reviewed: 02/10/2021  Noted  Mild renal insufficiency 10/05/2016  Menopause 10/05/2016    GENERAL REVIEW OF SYSTEMS:   General ROS: negative for - chills, fatigue, fever, weight gain or weight loss Allergy and Immunology ROS: negative for - hives  Hematological and Lymphatic ROS: negative for - bleeding problems or bruising, negative for palpable nodes Endocrine ROS: negative for - heat or cold intolerance, hair changes Respiratory ROS: negative for - cough, shortness of breath or wheezing Cardiovascular ROS: no chest pain or palpitations GI ROS: negative for nausea,  vomiting, abdominal pain, diarrhea, constipation Musculoskeletal ROS: negative for - joint swelling or muscle pain Neurological ROS: negative for - confusion, syncope Dermatological ROS: negative for pruritus and rash Psychiatric: negative for anxiety, depression, difficulty sleeping and memory loss  MEDICATIONS: Current Outpatient Medications  Medication Sig Dispense Refill   metroNIDAZOLE (METROGEL) 0.75 % gel APPLY A SMALL AMOUNT TO AFFECTED AREA AS DIRECTED 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   ALLERGIES: Adhesive  PAST MEDICAL HISTORY: Past Medical History:  Diagnosis Date   BRCA negative   Breast mass, left  Benign   PAST SURGICAL HISTORY: Past Surgical History:  Procedure Laterality Date   HYSTERECTOMY  still has ovaries   Left Breast Biopsy Left 10/16/2011  benign    FAMILY HISTORY: Family History  Problem Relation Age of Onset   Breast cancer Mother 30   Stroke Mother   Breast cancer Maternal Aunt 45   Colon cancer Maternal Grandfather   Diabetes type II Maternal Grandfather   Bladder Exstrophy Father   Cancer Father  bladder   Stroke Father   Diabetes type II Father   Alcohol abuse Father   Diabetes type II Sister   Mental retardation Sister   Alcohol abuse Brother   Alcohol abuse Paternal Grandfather    SOCIAL HISTORY: Social History   Socioeconomic History   Marital status: Married  Tobacco Use   Smoking status: Never Smoker   Smokeless tobacco: Never Used  Scientific laboratory technician Use: Never used  Substance and Sexual Activity   Alcohol use: No   Drug use: No   Sexual activity: Defer   PHYSICAL EXAM: Vitals:  03/18/21 1119  BP: 118/75  Pulse:  68   Body mass index is 29.86 kg/m. Weight: 83.9 kg (185 lb)   GENERAL: Alert, active, oriented x3  HEENT: Pupils equal reactive to light. Extraocular movements are intact. Sclera clear. Palpebral conjunctiva normal red color.Pharynx  clear.  NECK: Supple with no palpable mass and no adenopathy.  LUNGS: Sound clear with no rales rhonchi or wheezes.  HEART: Regular rhythm S1 and S2 without murmur.  BREAST: breasts appear normal, no suspicious masses, no skin or nipple changes or axillary nodes.  ABDOMEN: Soft and depressible, nontender with no palpable mass, no hepatomegaly.  EXTREMITIES: Well-developed well-nourished symmetrical with no dependent edema.  NEUROLOGICAL: Awake alert oriented, facial expression symmetrical, moving all extremities.  REVIEW OF DATA: I have reviewed the following data today: No visits with results within 3 Month(s) from this visit.  Latest known visit with results is:  Office Visit on 05/03/2020  Component Date Value   SARS-COV-2, NAA - [facility] 05/03/2020 Not Detected   SARS-CoV-2, NAA 2 DAY TA* 05/03/2020 Performed    ASSESSMENT: Ms. Emma Cuevas is a 55 y.o. female presenting for consultation for right breast cancer.   Patient was oriented again about the pathology results. Surgical alternatives were discussed with patient including partial vs total mastectomy. Surgical technique and post operative care was discussed with patient. Risk of surgery was discussed with patient including but not limited to: wound infection, seroma, hematoma, brachial plexopathy, mondor's disease (thrombosis of small veins of breast), chronic wound pain, breast lymphedema, altered sensation to the nipple and cosmesis among others.   Due to the large size of the mass up to 6.8 cm and the fact that she has at least 1 lymph node positive for metastatic disease patient seems to be a candidate for neoadjuvant chemotherapy. This will also benefit if patient decides to proceed with partial mastectomy to improve the chances of negative margins. Patient will have medical oncology appointment on 03/25/2021. The discussion will be finalized that day.  I also discussed with patient the possibility of needing a Port-A-Cath for  chemotherapy. I explained to the patient the procedure and its risk. If patient will proceed with neoadjuvant chemotherapy we will coordinate insertion of Port-A-Cath.  Malignant neoplasm of upper-outer quadrant of right female breast, unspecified estrogen receptor status (CMS-HCC) [C50.411]  PLAN: 1. Insertion of Port a Cath   Patient and her husband verbalized understanding, all questions were answered, and were agreeable with the plan outlined above.   Herbert Pun, MD

## 2021-03-31 NOTE — Progress Notes (Signed)
*  PRELIMINARY RESULTS* Echocardiogram 2D Echocardiogram has been performed.  Emma Cuevas 03/31/2021, 10:36 AM

## 2021-03-31 NOTE — H&P (View-Only) (Signed)
PATIENT PROFILE: Emma Cuevas is a 55 y.o. female who presents to the Clinic for consultation at the request of Dr. Babaoff for evaluation of right breast cancer.  PCP: Babaoff, Marcus Elijah, MD  HISTORY OF PRESENT ILLNESS: Emma Cuevas reports she had her usual screening mammogram. On her mammogram she was found with a significant area of calcification and distortion. This led to diagnostic mammogram. Persistent calcification or distortion was appreciated. The area of calcifications spanning up to 6.8 cm. Ultrasound also showed 2 abnormal lymph nodes. Biopsy of the anterior and posterior extent of the calcification was done and of one of the abdominal lymph nodes. Biopsy of the calcifications shows invasive mammary carcinoma. Biopsy of the lymph node shows metastatic disease from breast cancer. I personally evaluated the images of the screening mammogram, diagnostic mammogram, axillary ultrasound.  Family history of breast cancer: Mother and aunt Family history of other cancers: Grandparent with pancreatic cancer Menarche: Around 55 years old Menopause: Partial hysterectomy 12 years ago Used OCP: Yes Used estrogen and progesterone therapy: No History of Radiation to the chest: No Number of pregnancies: 4 Age of first pregnancy: 55 years old Previous biopsy: Yes left breast benign  PROBLEM LIST: Problem List Date Reviewed: 02/10/2021  Noted  Mild renal insufficiency 10/05/2016  Menopause 10/05/2016    GENERAL REVIEW OF SYSTEMS:   General ROS: negative for - chills, fatigue, fever, weight gain or weight loss Allergy and Immunology ROS: negative for - hives  Hematological and Lymphatic ROS: negative for - bleeding problems or bruising, negative for palpable nodes Endocrine ROS: negative for - heat or cold intolerance, hair changes Respiratory ROS: negative for - cough, shortness of breath or wheezing Cardiovascular ROS: no chest pain or palpitations GI ROS: negative for nausea,  vomiting, abdominal pain, diarrhea, constipation Musculoskeletal ROS: negative for - joint swelling or muscle pain Neurological ROS: negative for - confusion, syncope Dermatological ROS: negative for pruritus and rash Psychiatric: negative for anxiety, depression, difficulty sleeping and memory loss  MEDICATIONS: Current Outpatient Medications  Medication Sig Dispense Refill   metroNIDAZOLE (METROGEL) 0.75 % gel APPLY A SMALL AMOUNT TO AFFECTED AREA AS DIRECTED 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   ALLERGIES: Adhesive  PAST MEDICAL HISTORY: Past Medical History:  Diagnosis Date   BRCA negative   Breast mass, left  Benign   PAST SURGICAL HISTORY: Past Surgical History:  Procedure Laterality Date   HYSTERECTOMY  still has ovaries   Left Breast Biopsy Left 10/16/2011  benign    FAMILY HISTORY: Family History  Problem Relation Age of Onset   Breast cancer Mother 64   Stroke Mother   Breast cancer Maternal Aunt 45   Colon cancer Maternal Grandfather   Diabetes type II Maternal Grandfather   Bladder Exstrophy Father   Cancer Father  bladder   Stroke Father   Diabetes type II Father   Alcohol abuse Father   Diabetes type II Sister   Mental retardation Sister   Alcohol abuse Brother   Alcohol abuse Paternal Grandfather    SOCIAL HISTORY: Social History   Socioeconomic History   Marital status: Married  Tobacco Use   Smoking status: Never Smoker   Smokeless tobacco: Never Used  Vaping Use   Vaping Use: Never used  Substance and Sexual Activity   Alcohol use: No   Drug use: No   Sexual activity: Defer   PHYSICAL EXAM: Vitals:  03/18/21 1119  BP: 118/75  Pulse:   68   Body mass index is 29.86 kg/m. Weight: 83.9 kg (185 lb)   GENERAL: Alert, active, oriented x3  HEENT: Pupils equal reactive to light. Extraocular movements are intact. Sclera clear. Palpebral conjunctiva normal red color.Pharynx  clear.  NECK: Supple with no palpable mass and no adenopathy.  LUNGS: Sound clear with no rales rhonchi or wheezes.  HEART: Regular rhythm S1 and S2 without murmur.  BREAST: breasts appear normal, no suspicious masses, no skin or nipple changes or axillary nodes.  ABDOMEN: Soft and depressible, nontender with no palpable mass, no hepatomegaly.  EXTREMITIES: Well-developed well-nourished symmetrical with no dependent edema.  NEUROLOGICAL: Awake alert oriented, facial expression symmetrical, moving all extremities.  REVIEW OF DATA: I have reviewed the following data today: No visits with results within 3 Month(s) from this visit.  Latest known visit with results is:  Office Visit on 05/03/2020  Component Date Value   SARS-COV-2, NAA - [facility] 05/03/2020 Not Detected   SARS-CoV-2, NAA 2 DAY TA* 05/03/2020 Performed    ASSESSMENT: Emma Cuevas is a 55 y.o. female presenting for consultation for right breast cancer.   Patient was oriented again about the pathology results. Surgical alternatives were discussed with patient including partial vs total mastectomy. Surgical technique and post operative care was discussed with patient. Risk of surgery was discussed with patient including but not limited to: wound infection, seroma, hematoma, brachial plexopathy, mondor's disease (thrombosis of small veins of breast), chronic wound pain, breast lymphedema, altered sensation to the nipple and cosmesis among others.   Due to the large size of the mass up to 6.8 cm and the fact that she has at least 1 lymph node positive for metastatic disease patient seems to be a candidate for neoadjuvant chemotherapy. This will also benefit if patient decides to proceed with partial mastectomy to improve the chances of negative margins. Patient will have medical oncology appointment on 03/25/2021. The discussion will be finalized that day.  I also discussed with patient the possibility of needing a Port-A-Cath for  chemotherapy. I explained to the patient the procedure and its risk. If patient will proceed with neoadjuvant chemotherapy we will coordinate insertion of Port-A-Cath.  Malignant neoplasm of upper-outer quadrant of right female breast, unspecified estrogen receptor status (CMS-HCC) [C50.411]  PLAN: 1. Insertion of Port a Cath   Patient and her husband verbalized understanding, all questions were answered, and were agreeable with the plan outlined above.   Herbert Pun, MD

## 2021-04-01 ENCOUNTER — Ambulatory Visit
Admission: RE | Admit: 2021-04-01 | Discharge: 2021-04-01 | Disposition: A | Discharge status: Home / Self Care | Payer: Managed Care, Other (non HMO) | Source: Ambulatory Visit | Attending: Oncology | Admitting: Oncology

## 2021-04-01 ENCOUNTER — Encounter: Payer: Self-pay | Admitting: Oncology

## 2021-04-01 DIAGNOSIS — C50919 Malignant neoplasm of unspecified site of unspecified female breast: Secondary | ICD-10-CM | POA: Diagnosis present

## 2021-04-01 LAB — POCT I-STAT CREATININE: Creatinine, Ser: 0.9 mg/dL (ref 0.44–1.00)

## 2021-04-01 MED ORDER — LACTATED RINGERS IV SOLN: INTRAVENOUS | Status: DC

## 2021-04-01 MED ORDER — IOHEXOL 300 MG/ML  SOLN
100.0000 mL | Freq: Once | INTRAMUSCULAR | Status: AC | PRN
  Administered 2021-04-01: 100 mL via INTRAVENOUS

## 2021-04-01 MED ORDER — ORAL CARE MOUTH RINSE: 15.0000 mL | Freq: Once | OROMUCOSAL | Status: AC

## 2021-04-01 MED ORDER — FAMOTIDINE 20 MG PO TABS: 20.0000 mg | ORAL_TABLET | Freq: Once | ORAL | Status: AC

## 2021-04-01 MED ORDER — CHLORHEXIDINE GLUCONATE 0.12 % MT SOLN: 15.0000 mL | Freq: Once | OROMUCOSAL | Status: AC

## 2021-04-02 ENCOUNTER — Ambulatory Visit: Payer: Managed Care, Other (non HMO) | Admitting: Anesthesiology

## 2021-04-02 ENCOUNTER — Ambulatory Visit: Payer: Managed Care, Other (non HMO)

## 2021-04-02 ENCOUNTER — Other Ambulatory Visit: Payer: Self-pay | Admitting: Oncology

## 2021-04-02 ENCOUNTER — Encounter: Payer: Self-pay | Admitting: General Surgery

## 2021-04-02 ENCOUNTER — Other Ambulatory Visit: Payer: Self-pay

## 2021-04-02 ENCOUNTER — Encounter
Admission: RE | Disposition: A | Discharge status: Home / Self Care | Payer: Self-pay | Source: Ambulatory Visit | Attending: General Surgery

## 2021-04-02 ENCOUNTER — Ambulatory Visit
Admission: RE | Admit: 2021-04-02 | Discharge: 2021-04-02 | Disposition: A | Discharge status: Home / Self Care | Payer: Managed Care, Other (non HMO) | Source: Ambulatory Visit | Attending: General Surgery | Admitting: General Surgery

## 2021-04-02 DIAGNOSIS — C50411 Malignant neoplasm of upper-outer quadrant of right female breast: Secondary | ICD-10-CM | POA: Insufficient documentation

## 2021-04-02 DIAGNOSIS — Z452 Encounter for adjustment and management of vascular access device: Secondary | ICD-10-CM | POA: Insufficient documentation

## 2021-04-02 DIAGNOSIS — Z95828 Presence of other vascular implants and grafts: Secondary | ICD-10-CM

## 2021-04-02 DIAGNOSIS — Z803 Family history of malignant neoplasm of breast: Secondary | ICD-10-CM | POA: Insufficient documentation

## 2021-04-02 DIAGNOSIS — C50911 Malignant neoplasm of unspecified site of right female breast: Secondary | ICD-10-CM

## 2021-04-02 DIAGNOSIS — Z170 Estrogen receptor positive status [ER+]: Secondary | ICD-10-CM

## 2021-04-02 HISTORY — PX: PORTACATH PLACEMENT: SHX2246

## 2021-04-02 SURGERY — INSERTION, TUNNELED CENTRAL VENOUS DEVICE, WITH PORT
Anesthesia: General | Site: Chest

## 2021-04-02 MED ORDER — ONDANSETRON HCL 8 MG PO TABS: 8.0000 mg | ORAL_TABLET | Freq: Two times a day (BID) | ORAL | 1 refills | Status: DC | PRN

## 2021-04-02 MED ORDER — SODIUM CHLORIDE (PF) 0.9 % IJ SOLN
INTRAMUSCULAR | Status: DC | PRN
  Administered 2021-04-02: 50 mL

## 2021-04-02 MED ORDER — ACETAMINOPHEN 10 MG/ML IV SOLN
INTRAVENOUS | Status: DC | PRN
  Administered 2021-04-02: 1000 mg via INTRAVENOUS

## 2021-04-02 MED ORDER — HYDROCODONE-ACETAMINOPHEN 5-325 MG PO TABS
ORAL_TABLET | ORAL | Status: AC
  Filled 2021-04-02: qty 1

## 2021-04-02 MED ORDER — MIDAZOLAM HCL 2 MG/2ML IJ SOLN
INTRAMUSCULAR | Status: DC | PRN
  Administered 2021-04-02: 2 mg via INTRAVENOUS

## 2021-04-02 MED ORDER — PROPOFOL 500 MG/50ML IV EMUL
INTRAVENOUS | Status: DC | PRN
  Administered 2021-04-02: 125 ug/kg/min via INTRAVENOUS

## 2021-04-02 MED ORDER — HYDROCODONE-ACETAMINOPHEN 5-325 MG PO TABS
1.0000 | ORAL_TABLET | ORAL | 0 refills | Status: AC | PRN
Start: 2021-04-02 — End: 2021-04-05

## 2021-04-02 MED ORDER — BUPIVACAINE-EPINEPHRINE (PF) 0.25% -1:200000 IJ SOLN
INTRAMUSCULAR | Status: AC
  Filled 2021-04-02: qty 30

## 2021-04-02 MED ORDER — BUPIVACAINE-EPINEPHRINE (PF) 0.25% -1:200000 IJ SOLN
INTRAMUSCULAR | Status: DC | PRN
  Administered 2021-04-02: 10 mL via PERINEURAL

## 2021-04-02 MED ORDER — SODIUM CHLORIDE 0.9 % IV SOLN
INTRAVENOUS | Status: DC | PRN
  Administered 2021-04-02: 8 mL via INTRAMUSCULAR

## 2021-04-02 MED ORDER — FENTANYL CITRATE (PF) 100 MCG/2ML IJ SOLN: 25.0000 ug | INTRAMUSCULAR | Status: DC | PRN

## 2021-04-02 MED ORDER — FENTANYL CITRATE (PF) 100 MCG/2ML IJ SOLN
INTRAMUSCULAR | Status: AC
  Filled 2021-04-02: qty 2

## 2021-04-02 MED ORDER — HEPARIN SODIUM (PORCINE) 5000 UNIT/ML IJ SOLN
INTRAMUSCULAR | Status: AC
  Filled 2021-04-02: qty 1

## 2021-04-02 MED ORDER — ACETAMINOPHEN 10 MG/ML IV SOLN
INTRAVENOUS | Status: AC
  Filled 2021-04-02: qty 100

## 2021-04-02 MED ORDER — FENTANYL CITRATE (PF) 100 MCG/2ML IJ SOLN
INTRAMUSCULAR | Status: DC | PRN
  Administered 2021-04-02: 50 ug via INTRAVENOUS
  Administered 2021-04-02 (×2): 25 ug via INTRAVENOUS

## 2021-04-02 MED ORDER — SODIUM CHLORIDE (PF) 0.9 % IJ SOLN
INTRAMUSCULAR | Status: AC
  Filled 2021-04-02: qty 50

## 2021-04-02 MED ORDER — PROPOFOL 10 MG/ML IV BOLUS
INTRAVENOUS | Status: DC | PRN
  Administered 2021-04-02: 50 mg via INTRAVENOUS

## 2021-04-02 MED ORDER — ONDANSETRON HCL 4 MG/2ML IJ SOLN: 4.0000 mg | Freq: Once | INTRAMUSCULAR | Status: DC | PRN

## 2021-04-02 MED ORDER — CEFAZOLIN SODIUM-DEXTROSE 2-4 GM/100ML-% IV SOLN
INTRAVENOUS | Status: AC
  Filled 2021-04-02: qty 100

## 2021-04-02 MED ORDER — CEFAZOLIN SODIUM-DEXTROSE 2-4 GM/100ML-% IV SOLN
2.0000 g | INTRAVENOUS | Status: AC
  Administered 2021-04-02: 2 g via INTRAVENOUS

## 2021-04-02 MED ORDER — DEXAMETHASONE SODIUM PHOSPHATE 10 MG/ML IJ SOLN
INTRAMUSCULAR | Status: DC | PRN
  Administered 2021-04-02: 10 mg via INTRAVENOUS

## 2021-04-02 MED ORDER — DEXAMETHASONE 4 MG PO TABS: 8.0000 mg | ORAL_TABLET | Freq: Every day | ORAL | 1 refills | Status: DC

## 2021-04-02 MED ORDER — PHENYLEPHRINE HCL (PRESSORS) 10 MG/ML IV SOLN
INTRAVENOUS | Status: DC | PRN
  Administered 2021-04-02 (×2): 160 ug via INTRAVENOUS

## 2021-04-02 MED ORDER — CHLORHEXIDINE GLUCONATE 0.12 % MT SOLN
OROMUCOSAL | Status: AC
  Administered 2021-04-02: 15 mL via OROMUCOSAL
  Filled 2021-04-02: qty 15

## 2021-04-02 MED ORDER — GLYCOPYRROLATE 0.2 MG/ML IJ SOLN
INTRAMUSCULAR | Status: DC | PRN
  Administered 2021-04-02: .2 mg via INTRAVENOUS

## 2021-04-02 MED ORDER — LIDOCAINE-PRILOCAINE 2.5-2.5 % EX CREA: TOPICAL_CREAM | CUTANEOUS | 3 refills | Status: DC

## 2021-04-02 MED ORDER — FAMOTIDINE 20 MG PO TABS
ORAL_TABLET | ORAL | Status: AC
  Administered 2021-04-02: 20 mg via ORAL
  Filled 2021-04-02: qty 1

## 2021-04-02 MED ORDER — ONDANSETRON HCL 4 MG/2ML IJ SOLN
INTRAMUSCULAR | Status: DC | PRN
  Administered 2021-04-02: 4 mg via INTRAVENOUS

## 2021-04-02 MED ORDER — LORAZEPAM 0.5 MG PO TABS: 0.5000 mg | ORAL_TABLET | Freq: Four times a day (QID) | ORAL | 0 refills | Status: DC | PRN

## 2021-04-02 MED ORDER — HYDROCODONE-ACETAMINOPHEN 5-325 MG PO TABS
1.0000 | ORAL_TABLET | ORAL | Status: DC | PRN
  Administered 2021-04-02: 1 via ORAL

## 2021-04-02 MED ORDER — PROCHLORPERAZINE MALEATE 10 MG PO TABS: 10.0000 mg | ORAL_TABLET | Freq: Four times a day (QID) | ORAL | 1 refills | Status: DC | PRN

## 2021-04-02 MED ORDER — MIDAZOLAM HCL 2 MG/2ML IJ SOLN
INTRAMUSCULAR | Status: AC
  Filled 2021-04-02: qty 2

## 2021-04-02 SURGICAL SUPPLY — 37 items
ADH SKN CLS APL DERMABOND .7 (GAUZE/BANDAGES/DRESSINGS) ×1
APL PRP STRL LF DISP 70% ISPRP (MISCELLANEOUS) ×1
BAG DECANTER FOR FLEXI CONT (MISCELLANEOUS) ×2 IMPLANT
BLADE SURG 11 STRL SS SAFETY (MISCELLANEOUS) ×2 IMPLANT
BLADE SURG SZ11 CARB STEEL (BLADE) ×2 IMPLANT
BOOT SUTURE AID YELLOW STND (SUTURE) ×2 IMPLANT
CHLORAPREP W/TINT 26 (MISCELLANEOUS) ×2 IMPLANT
COVER LIGHT HANDLE STERIS (MISCELLANEOUS) ×4 IMPLANT
DERMABOND ADVANCED (GAUZE/BANDAGES/DRESSINGS) ×1
DERMABOND ADVANCED .7 DNX12 (GAUZE/BANDAGES/DRESSINGS) ×1 IMPLANT
DRAPE C-ARM XRAY 36X54 (DRAPES) ×2 IMPLANT
ELECT REM PT RETURN 9FT ADLT (ELECTROSURGICAL) ×2
ELECTRODE REM PT RTRN 9FT ADLT (ELECTROSURGICAL) ×1 IMPLANT
GAUZE 4X4 16PLY ~~LOC~~+RFID DBL (SPONGE) ×2 IMPLANT
GLOVE SURG ENC MOIS LTX SZ6.5 (GLOVE) ×2 IMPLANT
GLOVE SURG UNDER POLY LF SZ6.5 (GLOVE) ×2 IMPLANT
GOWN STRL REUS W/ TWL LRG LVL3 (GOWN DISPOSABLE) ×3 IMPLANT
GOWN STRL REUS W/TWL LRG LVL3 (GOWN DISPOSABLE) ×6
IV NS 500ML (IV SOLUTION) ×2
IV NS 500ML BAXH (IV SOLUTION) ×1 IMPLANT
KIT PORT POWER 8FR ISP CVUE (Port) ×2 IMPLANT
KIT TURNOVER KIT A (KITS) ×2 IMPLANT
LABEL OR SOLS (LABEL) ×2 IMPLANT
MANIFOLD NEPTUNE II (INSTRUMENTS) ×2 IMPLANT
NDL FILTER BLUNT 18X1 1/2 (NEEDLE) ×1 IMPLANT
NEEDLE FILTER BLUNT 18X 1/2SAF (NEEDLE) ×1
NEEDLE FILTER BLUNT 18X1 1/2 (NEEDLE) ×1 IMPLANT
PACK PORT-A-CATH (MISCELLANEOUS) ×2 IMPLANT
SUT MNCRL AB 4-0 PS2 18 (SUTURE) ×2 IMPLANT
SUT PROLENE 2 0 FS (SUTURE) ×2 IMPLANT
SUT VIC AB 2-0 SH 27 (SUTURE) ×2
SUT VIC AB 2-0 SH 27XBRD (SUTURE) ×1 IMPLANT
SUT VIC AB 3-0 SH 27 (SUTURE) ×2
SUT VIC AB 3-0 SH 27X BRD (SUTURE) ×1 IMPLANT
SYR 10ML LL (SYRINGE) ×4 IMPLANT
SYR 3ML LL SCALE MARK (SYRINGE) ×2 IMPLANT
WATER STERILE IRR 500ML POUR (IV SOLUTION) ×2 IMPLANT

## 2021-04-02 NOTE — Anesthesia Procedure Notes (Signed)
Procedure Name: General with mask airway Date/Time: 04/02/2021 4:00 PM Performed by: Kelton Pillar, CRNA Pre-anesthesia Checklist: Patient identified, Emergency Drugs available, Suction available and Patient being monitored Patient Re-evaluated:Patient Re-evaluated prior to induction Oxygen Delivery Method: Simple face mask Induction Type: IV induction Placement Confirmation: positive ETCO2, CO2 detector and breath sounds checked- equal and bilateral Dental Injury: Teeth and Oropharynx as per pre-operative assessment

## 2021-04-02 NOTE — Transfer of Care (Signed)
Immediate Anesthesia Transfer of Care Note  Patient: Emma Cuevas [facility]  Procedure(s) Performed: INSERTION PORT-A-CATH (Chest)  Patient Location: PACU  Anesthesia Type:General  Level of Consciousness: awake, drowsy and patient cooperative  Airway & Oxygen Therapy: Patient Spontanous Breathing  Post-op Assessment: Report given to RN and Post -op Vital signs reviewed and stable  Post vital signs: Reviewed and stable  Last Vitals:  Vitals Value Taken Time  BP 98/53 04/02/21 1632  Temp    Pulse 95 04/02/21 1634  Resp 16 04/02/21 1634  SpO2 98 % 04/02/21 1634  Vitals shown include unvalidated device data.  Last Pain:  Vitals:   04/02/21 1302  TempSrc: Oral  PainSc: 0-No pain         Complications: No notable events documented.

## 2021-04-02 NOTE — Interval H&P Note (Signed)
History and Physical Interval Note:  04/02/2021 3:38 PM  Emma Cuevas  has presented today for surgery, with the diagnosis of C50.411 Malignant neoplasm of upper outer quadrant of rt female breast, unspecified estrogen receptor.  The various methods of treatment have been discussed with the patient and family. After consideration of risks, benefits and other options for treatment, the patient has consented to  Procedure(s): INSERTION PORT-A-CATH (N/A) as a surgical intervention.  The patient's history has been reviewed, patient examined, no change in status, stable for surgery.  I have reviewed the patient's chart and labs.  Questions were answered to the patient's satisfaction.     Herbert Pun

## 2021-04-02 NOTE — Progress Notes (Signed)
START ON PATHWAY REGIMEN - Breast     Cycles 1 through 4: A cycle is every 14 days:     Doxorubicin      Cyclophosphamide      [id number]-[id number]    Cycles 5 through 16: A cycle is every 7 days:     Paclitaxel   **Always confirm dose/schedule in your pharmacy ordering system**  Patient Characteristics: Preoperative or Nonsurgical Candidate (Clinical Staging), Neoadjuvant Therapy followed by Surgery, Invasive Disease, Chemotherapy, HER2 Negative/Unknown/Equivocal, ER Positive Therapeutic Status: Preoperative or Nonsurgical Candidate (Clinical Staging) AJCC M Category: cM0 AJCC Grade: G3 Breast Surgical Plan: Neoadjuvant Therapy followed by Surgery ER Status: Positive (+) AJCC 8 Stage Grouping: IIIA HER2 Status: Negative (-) AJCC T Category: cT3 AJCC N Category: cN1 PR Status: Positive (+) Intent of Therapy: Curative Intent, Discussed with Patient

## 2021-04-02 NOTE — Anesthesia Preprocedure Evaluation (Signed)
Anesthesia Evaluation  Patient identified by MRN, date of birth, ID band Patient awake    Reviewed: Allergy & Precautions, NPO status , Patient's Chart, lab work & pertinent test results  History of Anesthesia Complications Negative for: history of anesthetic complications  Airway Mallampati: II  TM Distance: >3 FB Neck ROM: Full    Dental no notable dental hx.    Pulmonary neg pulmonary ROS, neg sleep apnea, neg COPD,    breath sounds clear to auscultation- rhonchi (-) wheezing      Cardiovascular Exercise Tolerance: Good (-) hypertension(-) CAD and (-) Past MI  Rhythm:Regular Rate:Normal - Systolic murmurs and - Diastolic murmurs    Neuro/Psych negative neurological ROS  negative psych ROS   GI/Hepatic negative GI ROS, Neg liver ROS,   Endo/Other  negative endocrine ROSneg diabetes  Renal/GU negative Renal ROS     Musculoskeletal negative musculoskeletal ROS (+)   Abdominal (+) + obese,   Peds  Hematology  (+) anemia ,   Anesthesia Other Findings Past Medical History: No date: Anemia 2013: Benign neoplasm of breast     Comment:  left breast 2015: BRCA negative No date: Cancer [facility]) 2013: Family history of malignant neoplasm of breast No date: Screening for obesity   Reproductive/Obstetrics                             Anesthesia Physical Anesthesia Plan  ASA: 2  Anesthesia Plan: General   Post-op Pain Management:    Induction: Intravenous  PONV Risk Score and Plan: 2 and Propofol infusion  Airway Management Planned: Natural Airway  Additional Equipment:   Intra-op Plan:   Post-operative Plan:   Informed Consent: I have reviewed the patients History and Physical, chart, labs and discussed the procedure including the risks, benefits and alternatives for the proposed anesthesia with the patient or authorized representative who has indicated his/her understanding and  acceptance.     Dental advisory given  Plan Discussed with: CRNA and Anesthesiologist  Anesthesia Plan Comments:         Anesthesia Quick Evaluation

## 2021-04-02 NOTE — Discharge Instructions (Addendum)
  Diet: Resume home heart healthy regular diet.   Activity: Increase activity as tolerated. Light activity and walking are encouraged. Do not drive or drink alcohol if taking narcotic pain medications.  Wound care: May shower with soapy water and pat dry (do not rub incisions), but no baths or submerging incision underwater until follow-up. (no swimming)   Medications: Resume all home medications. For mild to moderate pain: acetaminophen (Tylenol) or ibuprofen (if no kidney disease). Combining Tylenol with alcohol can substantially increase your risk of causing liver disease. Narcotic pain medications, if prescribed, can be used for severe pain, though may cause nausea, constipation, and drowsiness. Do not combine Tylenol and Norco within a 6 hour period as Norco contains Tylenol. If you do not need the narcotic pain medication, you do not need to fill the prescription.  Call office [phone]) at any time if any questions, worsening pain, fevers/chills, bleeding, drainage from incision site, or other concerns.  AMBULATORY SURGERY  DISCHARGE INSTRUCTIONS   The drugs that you were given will stay in your system until tomorrow so for the next 24 hours you should not:  Drive an automobile Make any legal decisions Drink any alcoholic beverage   You may resume regular meals tomorrow.  Today it is better to start with liquids and gradually work up to solid foods.  You may eat anything you prefer, but it is better to start with liquids, then soup and crackers, and gradually work up to solid foods.   Please notify your doctor immediately if you have any unusual bleeding, trouble breathing, redness and pain at the surgery site, drainage, fever, or pain not relieved by medication.      Please contact your physician with any problems or Same Day Surgery at [phone], Monday through Friday 6 am to 4 pm, or [place] [facility] number at [phone].

## 2021-04-02 NOTE — Progress Notes (Addendum)
CLINICAL TRIAL SELECTED - Breast  Trial: [facility] 16070 - TREATMENT OF REFRACTORY NAUSEA  **Trial eligibility and accrual should be confirmed by your research team**  Patient Characteristics: Preoperative or Nonsurgical Candidate (Clinical Staging), Neoadjuvant Therapy followed by Surgery, Invasive Disease, Chemotherapy, HER2 Negative/Unknown/Equivocal, ER Positive Therapeutic Status: Preoperative or Nonsurgical Candidate (Clinical Staging) AJCC M Category: cM0 AJCC Grade: G3 Breast Surgical Plan: Neoadjuvant Therapy followed by Surgery ER Status: Positive (+) AJCC 8 Stage Grouping: IIIA HER2 Status: Negative (-) AJCC T Category: cT3 AJCC N Category: cN1 PR Status: Positive (+) Intent of Therapy: Curative Intent, Discussed with Patient

## 2021-04-02 NOTE — Op Note (Signed)
SURGICAL PROCEDURE REPORT  DATE OF PROCEDURE: 04/02/2021   SURGEON: Dr. [name]   ANESTHESIA: Local with light IV sedation   PRE-OPERATIVE DIAGNOSIS: Advanced breast cancer requiring durable central venous access for chemotherapy   POST-OPERATIVE DIAGNOSIS: Same  PROCEDURE(S):  1.) Percutaneous access of Left internal jugular vein under ultrasound guidance 2.) Insertion of tunneled Left internal jugular central venous catheter with subcutaneous port  INTRAOPERATIVE FINDINGS: Patent easily compressible Left internal jugular vein with appropriate respiratory variations and well-secured tunneled central venous catheter with subcutaneous port at completion of the procedure  ESTIMATED BLOOD LOSS: Minimal (<20 mL)   SPECIMENS: None   IMPLANTS: 41F tunneled Bard PowerPort central venous catheter with subcutaneous port  DRAINS: None   COMPLICATIONS: None apparent   CONDITION AT COMPLETION: Hemodynamically stable, awake   DISPOSITION: PACU   INDICATION(S) FOR PROCEDURE:  Patient is a 55 y.o. female who presented with advanced breast cancer requiring durable central venous access for chemotherapy. All risks, benefits, and alternatives to above elective procedures were discussed with the patient, who elected to proceed, and informed consent was accordingly obtained at that time.  DETAILS OF PROCEDURE:  Patient was brought to the operative suite and appropriately identified. In Trendelenburg position, Left IJ venous access site was prepped and draped in the usual sterile fashion, and following a brief timeout, percutaneous Left IJ venous access was obtained under ultrasound guidance using Seldinger technique, by which local anesthetic was injected over the Left IJ vein, and access needle was inserted under direct ultrasound visualization into the Left IJ vein, through which soft guidewire was advanced, over which access needle was withdrawn. Guidewire was secured, attention was directed  to injection of local anesthetic along the planned tunnel site, 2-3 cm transverse Left chest incision was made and confirmed to accommodate the subcutaneous port, and flushed catheter was tunneled retrograde from the port site over the Left chest to the Left IJ access site with the attached port well-secured to the catheter and within the subcutaneous pocket. Insertion sheath was advanced over the guidewire, which was withdrawn along with the insertion sheath dilator. The catheter was introduced through the sheath and left on the [place] junction under fluoro guidance and catheter cut to desire lenght. Catheter connected to port and fixed to the pocket on two side to avoid twisting. Port was confirmed to withdraw blood and flush easily, after which concentrated heparin was instilled into the port and catheter. Dermis at the subcutaneous pocket was re-approximated using buried interrupted 3-0 Vicryl suture, and 4-0 Monocryl suture was used to re-approximate skin at the insertion and subcutaneous port sites in running subcuticular fashion for the subcutaneous port and buried interrupted fashion for the insertion site. Skin was cleaned, dried, and sterile skin glue was applied. Patient was then safely transferred to PACU for a chest x-ray. Ultrasound images are available on paper chart and Fluoroscopy guidance images are available in Epic.

## 2021-04-03 ENCOUNTER — Encounter: Payer: Self-pay | Admitting: General Surgery

## 2021-04-04 ENCOUNTER — Ambulatory Visit (HOSPITAL_COMMUNITY): Payer: Managed Care, Other (non HMO)

## 2021-04-04 ENCOUNTER — Encounter (HOSPITAL_COMMUNITY): Payer: Self-pay

## 2021-04-04 NOTE — Anesthesia Postprocedure Evaluation (Signed)
Anesthesia Post Note  Patient: Emma Cuevas  Procedure(s) Performed: INSERTION PORT-A-CATH (Chest)  Patient location during evaluation: PACU Anesthesia Type: General Level of consciousness: awake and alert Pain management: pain level controlled Vital Signs Assessment: post-procedure vital signs reviewed and stable Respiratory status: spontaneous breathing, nonlabored ventilation, respiratory function stable and patient connected to nasal cannula oxygen Cardiovascular status: blood pressure returned to baseline and stable Postop Assessment: no apparent nausea or vomiting Anesthetic complications: no   No notable events documented.   Last Vitals:  Vitals:   04/02/21 1720 04/02/21 1728  BP: 108/83 118/70  Pulse: 78 76  Resp: 13 18  Temp:  (!) 36.3 C  SpO2: 99% 97%    Last Pain:  Vitals:   04/02/21 1728  TempSrc: Temporal  PainSc: 2                  Martha Clan

## 2021-04-06 ENCOUNTER — Other Ambulatory Visit: Payer: Self-pay | Admitting: *Deleted

## 2021-04-06 DIAGNOSIS — Z170 Estrogen receptor positive status [ER+]: Secondary | ICD-10-CM

## 2021-04-06 DIAGNOSIS — C50911 Malignant neoplasm of unspecified site of right female breast: Secondary | ICD-10-CM

## 2021-04-07 ENCOUNTER — Inpatient Hospital Stay: Payer: Managed Care, Other (non HMO)

## 2021-04-07 ENCOUNTER — Inpatient Hospital Stay: Payer: Managed Care, Other (non HMO) | Admitting: Oncology

## 2021-04-07 ENCOUNTER — Other Ambulatory Visit: Payer: Self-pay | Admitting: *Deleted

## 2021-04-07 ENCOUNTER — Encounter: Payer: Self-pay | Admitting: Oncology

## 2021-04-07 ENCOUNTER — Other Ambulatory Visit: Payer: Self-pay

## 2021-04-07 VITALS — BP 126/72 | HR 81 | Temp 98.6°F | Resp 18 | Wt 184.4 lb

## 2021-04-07 DIAGNOSIS — Z170 Estrogen receptor positive status [ER+]: Secondary | ICD-10-CM

## 2021-04-07 DIAGNOSIS — C50911 Malignant neoplasm of unspecified site of right female breast: Secondary | ICD-10-CM

## 2021-04-07 DIAGNOSIS — Z5111 Encounter for antineoplastic chemotherapy: Secondary | ICD-10-CM

## 2021-04-07 LAB — CBC WITH DIFFERENTIAL/PLATELET
Abs Immature Granulocytes: 0.02 10*3/uL (ref 0.00–0.07)
Basophils Absolute: 0 10*3/uL (ref 0.0–0.1)
Basophils Relative: 0 %
Eosinophils Absolute: 0.4 10*3/uL (ref 0.0–0.5)
Eosinophils Relative: 5 %
HCT: 36.5 % (ref 36.0–46.0)
Hemoglobin: 11.8 g/dL — ABNORMAL LOW (ref 12.0–15.0)
Immature Granulocytes: 0 %
Lymphocytes Relative: 12 %
Lymphs Abs: 1 10*3/uL (ref 0.7–4.0)
MCH: 27.7 pg (ref 26.0–34.0)
MCHC: 32.3 g/dL (ref 30.0–36.0)
MCV: 85.7 fL (ref 80.0–100.0)
Monocytes Absolute: 0.7 10*3/uL (ref 0.1–1.0)
Monocytes Relative: 8 %
Neutro Abs: 6.8 10*3/uL (ref 1.7–7.7)
Neutrophils Relative %: 75 %
Platelets: 191 10*3/uL (ref 150–400)
RBC: 4.26 MIL/uL (ref 3.87–5.11)
RDW: 13.7 % (ref 11.5–15.5)
WBC: 9 10*3/uL (ref 4.0–10.5)
nRBC: 0 % (ref 0.0–0.2)

## 2021-04-07 LAB — COMPREHENSIVE METABOLIC PANEL
ALT: 18 U/L (ref 0–44)
AST: 20 U/L (ref 15–41)
Albumin: 3.8 g/dL (ref 3.5–5.0)
Alkaline Phosphatase: 59 U/L (ref 38–126)
Anion gap: 6 (ref 5–15)
BUN: 18 mg/dL (ref 6–20)
CO2: 27 mmol/L (ref 22–32)
Calcium: 9 mg/dL (ref 8.9–10.3)
Chloride: 104 mmol/L (ref 98–111)
Creatinine, Ser: 0.95 mg/dL (ref 0.44–1.00)
GFR, Estimated: >60 mL/min (ref >60)
Glucose, Bld: 130 mg/dL — ABNORMAL HIGH (ref 70–99)
Potassium: 3.7 mmol/L (ref 3.5–5.1)
Sodium: 137 mmol/L (ref 135–145)
Total Bilirubin: 0.5 mg/dL (ref 0.3–1.2)
Total Protein: 7.3 g/dL (ref 6.5–8.1)

## 2021-04-07 LAB — HEPATITIS B SURFACE ANTIGEN: Hepatitis B Surface Ag: NONREACTIVE

## 2021-04-07 MED ORDER — SODIUM CHLORIDE 0.9 % IV SOLN
Freq: Once | INTRAVENOUS | Status: AC
  Filled 2021-04-07: qty 250

## 2021-04-07 MED ORDER — HEPARIN SOD (PORK) LOCK FLUSH 100 UNIT/ML IV SOLN
500.0000 [IU] | Freq: Once | INTRAVENOUS | Status: AC | PRN
  Filled 2021-04-07: qty 5

## 2021-04-07 MED ORDER — SODIUM CHLORIDE 0.9 % IV SOLN
600.0000 mg/m2 | Freq: Once | INTRAVENOUS | Status: AC
  Administered 2021-04-07: 1160 mg via INTRAVENOUS
  Filled 2021-04-07: qty 50

## 2021-04-07 MED ORDER — DOXORUBICIN HCL CHEMO IV INJECTION 2 MG/ML
60.0000 mg/m2 | Freq: Once | INTRAVENOUS | Status: AC
  Administered 2021-04-07: 116 mg via INTRAVENOUS
  Filled 2021-04-07: qty 50

## 2021-04-07 MED ORDER — PALONOSETRON HCL INJECTION 0.25 MG/5ML
0.2500 mg | Freq: Once | INTRAVENOUS | Status: AC
  Administered 2021-04-07: 0.25 mg via INTRAVENOUS
  Filled 2021-04-07: qty 5

## 2021-04-07 MED ORDER — SODIUM CHLORIDE 0.9 % IV SOLN
150.0000 mg | Freq: Once | INTRAVENOUS | Status: AC
  Administered 2021-04-07: 150 mg via INTRAVENOUS
  Filled 2021-04-07: qty 150

## 2021-04-07 MED ORDER — SODIUM CHLORIDE 0.9 % IV SOLN
10.0000 mg | Freq: Once | INTRAVENOUS | Status: AC
  Administered 2021-04-07: 10 mg via INTRAVENOUS
  Filled 2021-04-07: qty 10

## 2021-04-07 MED ORDER — HEPARIN SOD (PORK) LOCK FLUSH 100 UNIT/ML IV SOLN
INTRAVENOUS | Status: AC
  Administered 2021-04-07: 500 [IU]
  Filled 2021-04-07: qty 5

## 2021-04-07 NOTE — Patient Instructions (Addendum)
[facility]   Discharge Instructions: Thank you for choosing [facility] to provide your oncology and hematology care.  If you have a lab appointment with the [facility], please go directly to the [facility] and check in at the registration area.  Wear comfortable clothing and clothing appropriate for easy access to any Portacath or PICC line.   We strive to give you quality time with your provider. You may need to reschedule your appointment if you arrive late (15 or more minutes).  Arriving late affects you and other patients whose appointments are after yours.  Also, if you miss three or more appointments without notifying the office, you may be dismissed from the clinic at the provider's discretion.      For prescription refill requests, have your pharmacy contact our office and allow 72 hours for refills to be completed.    Today you received the following chemotherapy and/or immunotherapy agents: Cytoxan, Adriamycin      To help prevent nausea and vomiting after your treatment, we encourage you to take your nausea medication as directed.  BELOW ARE SYMPTOMS THAT SHOULD BE REPORTED IMMEDIATELY: *FEVER GREATER THAN 100.4 F (38 C) OR HIGHER *CHILLS OR SWEATING *NAUSEA AND VOMITING THAT IS NOT CONTROLLED WITH YOUR NAUSEA MEDICATION *UNUSUAL SHORTNESS OF BREATH *UNUSUAL BRUISING OR BLEEDING *URINARY PROBLEMS (pain or burning when urinating, or frequent urination) *BOWEL PROBLEMS (unusual diarrhea, constipation, pain near the anus) TENDERNESS IN MOUTH AND THROAT WITH OR WITHOUT PRESENCE OF ULCERS (sore throat, sores in mouth, or a toothache) UNUSUAL RASH, SWELLING OR PAIN  UNUSUAL VAGINAL DISCHARGE OR ITCHING   Items with * indicate a potential emergency and should be followed up as soon as possible or go to the Emergency Department if any problems should occur.  Please show the CHEMOTHERAPY ALERT CARD or IMMUNOTHERAPY ALERT CARD  at check-in to the Emergency Department and triage nurse.  Should you have questions after your visit or need to cancel or reschedule your appointment, please contact [facility]  [phone] and follow the prompts.  Office hours are 8:00 a.m. to 4:30 p.m. Monday - Friday. Please note that voicemails left after 4:00 p.m. may not be returned until the following business day.  We are closed weekends and major holidays. You have access to a nurse at all times for urgent questions. Please call the main number to the clinic [phone] and follow the prompts.  For any non-urgent questions, you may also contact your provider using MyChart. We now offer e-Visits for anyone 52 and older to request care online for non-urgent symptoms. For details visit [email or web address].   Also download the MyChart app! Go to the app store, search "MyChart", open the app, select [facility], and log in with your MyChart username and password.  Due to Covid, a mask is required upon entering the hospital/clinic. If you do not have a mask, one will be given to you upon arrival. For doctor visits, patients may have 1 support person aged 72 or older with them. For treatment visits, patients cannot have anyone with them due to current Covid guidelines and our immunocompromised population.   Doxorubicin injection What is this medication? DOXORUBICIN (dox oh ROO bi sin) is a chemotherapy drug. It is used to treat many kinds of cancer like leukemia, lymphoma, neuroblastoma, sarcoma, and Wilms' tumor. It is also used to treat bladder cancer, breast cancer, lung cancer, ovarian cancer, stomach cancer, and thyroid cancer. This  medicine may be used for other purposes; ask your health care provider or pharmacist if you have questions. COMMON BRAND NAME(S): Adriamycin, Adriamycin PFS, Adriamycin RDF, Rubex What should I tell my care team before I take this medication? They need to know if you have  any of these conditions: heart disease history of low blood counts caused by a medicine liver disease recent or ongoing radiation therapy an unusual or allergic reaction to doxorubicin, other chemotherapy agents, other medicines, foods, dyes, or preservatives pregnant or trying to get pregnant breast-feeding How should I use this medication? This drug is given as an infusion into a vein. It is administered in a hospital or clinic by a specially trained health care professional. If you have pain, swelling, burning or any unusual feeling around the site of your injection, tell your health care professional right away. Talk to your pediatrician regarding the use of this medicine in children. Special care may be needed. Overdosage: If you think you have taken too much of this medicine contact a poison control center or emergency room at once. NOTE: This medicine is only for you. Do not share this medicine with others. What if I miss a dose? It is important not to miss your dose. Call your doctor or health care professional if you are unable to keep an appointment. What may interact with this medication? This medicine may interact with the following medications: 6-mercaptopurine paclitaxel phenytoin St. John's Wort trastuzumab verapamil This list may not describe all possible interactions. Give your health care provider a list of all the medicines, herbs, non-prescription drugs, or dietary supplements you use. Also tell them if you smoke, drink alcohol, or use illegal drugs. Some items may interact with your medicine. What should I watch for while using this medication? This drug may make you feel generally unwell. This is not uncommon, as chemotherapy can affect healthy cells as well as cancer cells. Report any side effects. Continue your course of treatment even though you feel ill unless your doctor tells you to stop. There is a maximum amount of this medicine you should receive throughout  your life. The amount depends on the medical condition being treated and your overall health. Your doctor will watch how much of this medicine you receive in your lifetime. Tell your doctor if you have taken this medicine before. You may need blood work done while you are taking this medicine. Your urine may turn red for a few days after your dose. This is not blood. If your urine is dark or brown, call your doctor. In some cases, you may be given additional medicines to help with side effects. Follow all directions for their use. Call your doctor or health care professional for advice if you get a fever, chills or sore throat, or other symptoms of a cold or flu. Do not treat yourself. This drug decreases your body's ability to fight infections. Try to avoid being around people who are sick. This medicine may increase your risk to bruise or bleed. Call your doctor or health care professional if you notice any unusual bleeding. Talk to your doctor about your risk of cancer. You may be more at risk for certain types of cancers if you take this medicine. Do not become pregnant while taking this medicine or for 6 months after stopping it. Women should inform their doctor if they wish to become pregnant or think they might be pregnant. Men should not father a child while taking this medicine and for 6  months after stopping it. There is a potential for serious side effects to an unborn child. Talk to your health care professional or pharmacist for more information. Do not breast-feed an infant while taking this medicine. This medicine has caused ovarian failure in some women and reduced sperm counts in some men This medicine may interfere with the ability to have a child. Talk with your doctor or health care professional if you are concerned about your fertility. This medicine may cause a decrease in Co-Enzyme Q-10. You should make sure that you get enough Co-Enzyme Q-10 while you are taking this medicine.  Discuss the foods you eat and the vitamins you take with your health care professional. What side effects may I notice from receiving this medication? Side effects that you should report to your doctor or health care professional as soon as possible: allergic reactions like skin rash, itching or hives, swelling of the face, lips, or tongue breathing problems chest pain fast or irregular heartbeat low blood counts - this medicine may decrease the number of white blood cells, red blood cells and platelets. You may be at increased risk for infections and bleeding. pain, redness, or irritation at site where injected signs of infection - fever or chills, cough, sore throat, pain or difficulty passing urine signs of decreased platelets or bleeding - bruising, pinpoint red spots on the skin, black, tarry stools, blood in the urine swelling of the ankles, feet, hands tiredness weakness Side effects that usually do not require medical attention (report to your doctor or health care professional if they continue or are bothersome): diarrhea hair loss mouth sores nail discoloration or damage nausea red colored urine vomiting This list may not describe all possible side effects. Call your doctor for medical advice about side effects. You may report side effects to FDA at 1-800-FDA-1088. Where should I keep my medication? This drug is given in a hospital or clinic and will not be stored at home. NOTE: This sheet is a summary. It may not cover all possible information. If you have questions about this medicine, talk to your doctor, pharmacist, or health care provider.  2022 Elsevier/Gold Standard (2017-01-14 00:00:00)  Cyclophosphamide Injection What is this medication? CYCLOPHOSPHAMIDE (sye kloe FOSS fa mide) is a chemotherapy drug. It slows the growth of cancer cells. This medicine is used to treat many types of cancer like lymphoma, myeloma, leukemia, breast cancer, and ovarian cancer, to name a  few. This medicine may be used for other purposes; ask your health care provider or pharmacist if you have questions. COMMON BRAND NAME(S): Cytoxan, Neosar What should I tell my care team before I take this medication? They need to know if you have any of these conditions: heart disease history of irregular heartbeat infection kidney disease liver disease low blood counts, like white cells, platelets, or red blood cells on hemodialysis recent or ongoing radiation therapy scarring or thickening of the lungs trouble passing urine an unusual or allergic reaction to cyclophosphamide, other medicines, foods, dyes, or preservatives pregnant or trying to get pregnant breast-feeding How should I use this medication? This drug is usually given as an injection into a vein or muscle or by infusion into a vein. It is administered in a hospital or clinic by a specially trained health care professional. Talk to your pediatrician regarding the use of this medicine in children. Special care may be needed. Overdosage: If you think you have taken too much of this medicine contact a poison control center or emergency  room at once. NOTE: This medicine is only for you. Do not share this medicine with others. What if I miss a dose? It is important not to miss your dose. Call your doctor or health care professional if you are unable to keep an appointment. What may interact with this medication? amphotericin B azathioprine certain antivirals for HIV or hepatitis certain medicines for blood pressure, heart disease, irregular heart beat certain medicines that treat or prevent blood clots like warfarin certain other medicines for cancer cyclosporine etanercept indomethacin medicines that relax muscles for surgery medicines to increase blood counts metronidazole This list may not describe all possible interactions. Give your health care provider a list of all the medicines, herbs, non-prescription drugs,  or dietary supplements you use. Also tell them if you smoke, drink alcohol, or use illegal drugs. Some items may interact with your medicine. What should I watch for while using this medication? Your condition will be monitored carefully while you are receiving this medicine. You may need blood work done while you are taking this medicine. Drink water or other fluids as directed. Urinate often, even at night. Some products may contain alcohol. Ask your health care professional if this medicine contains alcohol. Be sure to tell all health care professionals you are taking this medicine. Certain medicines, like metronidazole and disulfiram, can cause an unpleasant reaction when taken with alcohol. The reaction includes flushing, headache, nausea, vomiting, sweating, and increased thirst. The reaction can last from 30 minutes to several hours. Do not become pregnant while taking this medicine or for 1 year after stopping it. Women should inform their health care professional if they wish to become pregnant or think they might be pregnant. Men should not father a child while taking this medicine and for 4 months after stopping it. There is potential for serious side effects to an unborn child. Talk to your health care professional for more information. Do not breast-feed an infant while taking this medicine or for 1 week after stopping it. This medicine has caused ovarian failure in some women. This medicine may make it more difficult to get pregnant. Talk to your health care professional if you are concerned about your fertility. This medicine has caused decreased sperm counts in some men. This may make it more difficult to father a child. Talk to your health care professional if you are concerned about your fertility. Call your health care professional for advice if you get a fever, chills, or sore throat, or other symptoms of a cold or flu. Do not treat yourself. This medicine decreases your body's ability  to fight infections. Try to avoid being around people who are sick. Avoid taking medicines that contain aspirin, acetaminophen, ibuprofen, naproxen, or ketoprofen unless instructed by your health care professional. These medicines may hide a fever. Talk to your health care professional about your risk of cancer. You may be more at risk for certain types of cancer if you take this medicine. If you are going to need surgery or other procedure, tell your health care professional that you are using this medicine. Be careful brushing or flossing your teeth or using a toothpick because you may get an infection or bleed more easily. If you have any dental work done, tell your dentist you are receiving this medicine. What side effects may I notice from receiving this medication? Side effects that you should report to your doctor or health care professional as soon as possible: allergic reactions like skin rash, itching or hives, swelling  of the face, lips, or tongue breathing problems nausea, vomiting signs and symptoms of bleeding such as bloody or black, tarry stools; red or dark brown urine; spitting up blood or brown material that looks like coffee grounds; red spots on the skin; unusual bruising or bleeding from the eyes, gums, or nose signs and symptoms of heart failure like fast, irregular heartbeat, sudden weight gain; swelling of the ankles, feet, hands signs and symptoms of infection like fever; chills; cough; sore throat; pain or trouble passing urine signs and symptoms of kidney injury like trouble passing urine or change in the amount of urine signs and symptoms of liver injury like dark yellow or brown urine; general ill feeling or flu-like symptoms; light-colored stools; loss of appetite; nausea; right upper belly pain; unusually weak or tired; yellowing of the eyes or skin Side effects that usually do not require medical attention (report to your doctor or health care professional if they  continue or are bothersome): confusion decreased hearing diarrhea facial flushing hair loss headache loss of appetite missed menstrual periods signs and symptoms of low red blood cells or anemia such as unusually weak or tired; feeling faint or lightheaded; falls skin discoloration This list may not describe all possible side effects. Call your doctor for medical advice about side effects. You may report side effects to FDA at 1-800-FDA-1088. Where should I keep my medication? This drug is given in a hospital or clinic and will not be stored at home. NOTE: This sheet is a summary. It may not cover all possible information. If you have questions about this medicine, talk to your doctor, pharmacist, or health care provider.  2022 Elsevier/Gold Standard (2021-01-28 00:00:00)

## 2021-04-07 NOTE — Progress Notes (Signed)
Mrs. Rivkin tolerated her first treatment of Adriamycin and Cytoxan without any complications. Vital signs stable at discharge.

## 2021-04-07 NOTE — Progress Notes (Signed)
Hematology/Oncology Consult note [facility]  Telephone:([phone] [phone]  Patient Care Team: Derinda Late, MD as PCP - General (Family Medicine) Christene Lye, MD (General Surgery) Rosina Lowenstein, MD (Obstetrics and Gynecology)   Name of the patient: Emma Cuevas  [id number]  06/15/1965   Date of visit: 04/07/21  Diagnosis-clinical prognostic stage IIIa right breast cancer T3 N1 M0 ER/PR positive HER2 negative  Chief complaint/ Reason for visit-on treatment assessment prior to cycle 1 of neoadjuvant dose dense AC chemotherapy  Heme/Onc history: Patient is a 55 year old female who underwent a routine bilateral screening mammogram in September 2022 which showed calcifications in the right breast and prominent right axillary lymph node concerning for malignancy.  This was followed by diagnostic mammogram and ultrasound.  Mammogram showed extensive suspicious pleomorphic calcifications measuring up to 6.8 cm.  2 abnormal lymph nodes were seen in the right axilla on ultrasound as well.  Both the calcifications and the lymph node was biopsied.  Anterior and posterior end of the calcifications was consistent with invasive mammary carcinoma grade 3.  Lymph node was positive for metastatic carcinoma as well.  ER 91 to 100% positive PR 41 to 50% positive and HER2 negative.  Ki-67 40%   Menarche at the age of 48.  She is G2, P2 L2.  Age at first birth 20.  She used birth control pills for about 8 to 10 years.  She had a hysterectomy at the age of 17 but ovaries are still in situ.  Family history significant for breast cancer in her mother and paternal aunt.  Father with metastatic bladder cancer and maternal grandfather with pancreatic cancer.  Patient had BRCA 1 and 2 testing done in the past and was reportedly negative.  MRI bilateral breasts showed non-mass enhancement involving the upper outer lower outer and upper inner quadrants finding  10.9 x 8.4 x 6.8 cm.  Non-mass enhancement in the left breast spanning 1.6 x 1.3 x 0.8 cm.  6 metastatic right axillary lymph nodes.  CT chest abdomen and pelvis with contrast showed mild right axillary adenopathy compatible with metastatic disease but no evidence of distant metastatic disease.  5 mm left lower lobe nodule    Interval history-patient is doing well at this time.  Denies any specific complaints  ECOG PS- 0 Pain scale- 0   Review of systems- Review of Systems  Constitutional:  Negative for chills, fever, malaise/fatigue and weight loss.  HENT:  Negative for congestion, ear discharge and nosebleeds.   Eyes:  Negative for blurred vision.  Respiratory:  Negative for cough, hemoptysis, sputum production, shortness of breath and wheezing.   Cardiovascular:  Negative for chest pain, palpitations, orthopnea and claudication.  Gastrointestinal:  Negative for abdominal pain, blood in stool, constipation, diarrhea, heartburn, melena, nausea and vomiting.  Genitourinary:  Negative for dysuria, flank pain, frequency, hematuria and urgency.  Musculoskeletal:  Negative for back pain, joint pain and myalgias.  Skin:  Negative for rash.  Neurological:  Negative for dizziness, tingling, focal weakness, seizures, weakness and headaches.  Endo/Heme/Allergies:  Does not bruise/bleed easily.  Psychiatric/Behavioral:  Negative for depression and suicidal ideas. The patient does not have insomnia.      Allergies  Allergen Reactions   Tape     Redness and burning     Past Medical History:  Diagnosis Date   Anemia    Benign neoplasm of breast 2013   left breast   BRCA negative 2015   Cancer (  [place])    Family history of malignant neoplasm of breast 2013   Screening for obesity      Past Surgical History:  Procedure Laterality Date   ABDOMINAL HYSTERECTOMY  2011   partial   BREAST BIOPSY Left 2013   BREAST BIOPSY Right 03/14/2021   Stereo bx-anterior calcs, "coil" clip-path  pending   BREAST BIOPSY Right 03/14/2021   stereo bx-calcs, "Ribbon" clip-path pending   breast biopsy Right 03/14/2021   [place] Bx, Axilla, path pending   BREAST SURGERY Left 10/16/2011   left breast finesse biopsy   COLONOSCOPY WITH PROPOFOL N/A 12/05/2014   Procedure: COLONOSCOPY WITH PROPOFOL;  Surgeon: Christene Lye, MD;  Location: [facility] ENDOSCOPY;  Service: Endoscopy;  Laterality: N/A;   DILATION AND CURETTAGE OF UTERUS     PORTACATH PLACEMENT N/A 04/02/2021   Procedure: INSERTION PORT-A-CATH;  Surgeon: Herbert Pun, MD;  Location: [facility] ORS;  Service: General;  Laterality: N/A;    Social History   Socioeconomic History   Marital status: Married    Spouse name: Legrand Como   Number of children: Not on file   Years of education: Not on file   Highest education level: Not on file  Occupational History   Not on file  Tobacco Use   Smoking status: Never   Smokeless tobacco: Never  Substance and Sexual Activity   Alcohol use: No   Drug use: No   Sexual activity: Not on file  Other Topics Concern   Not on file  Social History Narrative   Not on file   Social Determinants of Health   Financial Resource Strain: Not on file  Food Insecurity: Not on file  Transportation Needs: Not on file  Physical Activity: Not on file  Stress: Not on file  Social Connections: Not on file  Intimate Partner Violence: Not on file    Family History  Problem Relation Age of Onset   Breast cancer Mother 56   Bladder Cancer Father    Breast cancer Paternal Aunt 40   Cancer Maternal Grandfather        prostate     Current Outpatient Medications:    acetaminophen (TYLENOL) 500 MG tablet, Take 1,000 mg by mouth every 6 (six) hours as needed for moderate pain., Disp: , Rfl:    dexamethasone (DECADRON) 4 MG tablet, Take 2 tablets (8 mg total) by mouth daily. Take daily for 3 days after chemo. Take with food., Disp: 30 tablet, Rfl: 1   fluorouracil (EFUDEX) 5 % cream, Apply  topically 2 (two) times daily. (Patient not taking: No sig reported), Disp: 40 g, Rfl: 1   lidocaine-prilocaine (EMLA) cream, Apply to affected area once, Disp: 30 g, Rfl: 3   LORazepam (ATIVAN) 0.5 MG tablet, Take 1 tablet (0.5 mg total) by mouth every 6 (six) hours as needed (Nausea or vomiting)., Disp: 30 tablet, Rfl: 0   metroNIDAZOLE (METROGEL) 0.75 % gel, Apply 1 application topically daily as needed (rosacea)., Disp: , Rfl:    ondansetron (ZOFRAN) 8 MG tablet, Take 1 tablet (8 mg total) by mouth 2 (two) times daily as needed. Start on the third day after chemotherapy., Disp: 30 tablet, Rfl: 1   prochlorperazine (COMPAZINE) 10 MG tablet, Take 1 tablet (10 mg total) by mouth every 6 (six) hours as needed (Nausea or vomiting)., Disp: 30 tablet, Rfl: 1  Physical exam:  Vitals:   04/07/21 0922  BP: 126/72  Pulse: 81  Resp: 18  Temp: 98.6 F (37 C)  SpO2: 100%  Weight: 184 lb 6.4 oz (83.6 kg)   Physical Exam HENT:     Head: Normocephalic and atraumatic.  Eyes:     Pupils: Pupils are equal, round, and reactive to light.  Cardiovascular:     Rate and Rhythm: Normal rate and regular rhythm.     Heart sounds: Normal heart sounds.  Pulmonary:     Effort: Pulmonary effort is normal.     Breath sounds: Normal breath sounds.  Abdominal:     General: Bowel sounds are normal.     Palpations: Abdomen is soft.  Musculoskeletal:     Cervical back: Normal range of motion.  Skin:    General: Skin is warm and dry.  Neurological:     Mental Status: She is alert and oriented to person, place, and time.     CMP Latest Ref Rng & Units 04/01/2021  Creatinine 0.44 - 1.00 mg/dL 0.90   No flowsheet data found.  No images are attached to the encounter.  CT CHEST ABDOMEN PELVIS W CONTRAST  Result Date: 04/01/2021 CLINICAL DATA:  Breast cancer.  Staging. EXAM: CT CHEST, ABDOMEN, AND PELVIS WITH CONTRAST TECHNIQUE: Multidetector CT imaging of the chest, abdomen and pelvis was performed following  the standard protocol during bolus administration of intravenous contrast. CONTRAST:  171mL OMNIPAQUE IOHEXOL 300 MG/ML  SOLN COMPARISON:  None FINDINGS: CT CHEST FINDINGS Cardiovascular: The heart size is normal. No substantial pericardial effusion. No thoracic aortic aneurysm. Mediastinum/Nodes: No mediastinal lymphadenopathy. There is no hilar lymphadenopathy. The esophagus has normal imaging features. There is no left axillary lymphadenopathy. Mild lymphadenopathy noted in the right axilla including 11 mm short axis node on 24/2 and 11 mm short axis node on 25/2. Lungs/Pleura: No suspicious pulmonary nodule or mass. 5 mm left lower lobe nodule on 99/3 is likely benign. No focal airspace consolidation. No pleural effusion. Musculoskeletal: No worrisome lytic or sclerotic osseous abnormality. CT ABDOMEN PELVIS FINDINGS Hepatobiliary: No suspicious focal abnormality within the liver parenchyma. Calcified gallstones identified measuring up to 13 mm in the neck of the gallbladder. No intrahepatic or extrahepatic biliary dilation. Pancreas: No focal mass lesion. No dilatation of the main duct. No intraparenchymal cyst. No peripancreatic edema. Spleen: No splenomegaly. No focal mass lesion. Adrenals/Urinary Tract: No adrenal nodule or mass. Kidneys unremarkable. No evidence for hydroureter. The urinary bladder appears normal for the degree of distention. Stomach/Bowel: Stomach is unremarkable. No gastric wall thickening. No evidence of outlet obstruction. Duodenum is normally positioned as is the ligament of Treitz. No small bowel wall thickening. No small bowel dilatation. The terminal ileum is normal. The appendix is not well visualized, but there is no edema or inflammation in the region of the cecum. No gross colonic mass. No colonic wall thickening. Diverticular changes are noted in the left colon without evidence of diverticulitis. Vascular/Lymphatic: No abdominal aortic aneurysm. No abdominal aortic  atherosclerotic calcification. There is no gastrohepatic or hepatoduodenal ligament lymphadenopathy. No retroperitoneal or mesenteric lymphadenopathy. No pelvic sidewall lymphadenopathy. Reproductive: Unremarkable. Other: No intraperitoneal free fluid. Musculoskeletal: No worrisome lytic or sclerotic osseous abnormality. IMPRESSION: 1. Mild right axillary lymphadenopathy, compatible with the patient's known metastatic disease. No other evidence for metastatic disease in the chest, abdomen, or pelvis. 2. 5 mm left lower lobe pulmonary nodule, likely benign. Attention on follow-up recommended. 3. Cholelithiasis. 4. Left colonic diverticulosis without diverticulitis. Electronically Signed   By: Misty Stanley M.D.   On: 04/01/2021 15:58   MR BREAST BILATERAL W WO CONTRAST INC CAD  Result Date: 03/27/2021  CLINICAL DATA:  Recently biopsied extensive calcifications throughout the upper-outer quadrant of the right breast with pathology results of invasive mammary carcinoma and high-grade DCIS. The patient also had a right axillary lymph node biopsy demonstrating metastatic mammary carcinoma. LABS:  None obtained on site today. EXAM: BILATERAL BREAST MRI WITH AND WITHOUT CONTRAST TECHNIQUE: Multiplanar, multisequence MR images of both breasts were obtained prior to and following the intravenous administration of 8 ml of Gadavist Three-dimensional MR images were rendered by post-processing of the original MR data on an independent workstation. The three-dimensional MR images were interpreted, and findings are reported in the following complete MRI report for this study. Three dimensional images were evaluated at the independent interpreting workstation using the DynaCAD thin client. COMPARISON:  Recent mammogram, ultrasound and biopsy examinations. FINDINGS: Breast composition: c. Heterogeneous fibroglandular tissue. Background parenchymal enhancement: Minimal Right breast: Extensive non mass enhancement extending from  the nipple to the posterior aspects of the breast involving the upper outer, lower outer and upper inner quadrants. This spans an area measuring 10.9 x 8.4 x 6.8 cm and corresponds to the recently biopsied invasive mammary carcinoma and DCIS. This has a mixture of enhancement kinetics, including rapid wash-in/washout. Left breast: Rounded similar area non mass enhancement in the slightly lower outer quadrant, spanning 1.6 x 1.3 x 0.8 cm. This has enhancement kinetics similar to the biopsy-proven malignancy on the right. Lymph nodes: 6 right axillary lymph nodes with abnormal cortical thickening, including the recently biopsied lymph. No abnormal axillary lymph nodes on the left and no abnormal intramammary lymph nodes on either side. Ancillary findings:  None. IMPRESSION: 1. 10.9 x 8 4 x 6.8 cm area of biopsy-proven malignancy the extending from the anterior to the posterior aspects of the right breast, involving 3 quadrants. 2. 1.6 x 1.3 x 0.8 cm similar area in the slightly lower outer quadrant of the left breast, suspicious for malignancy. 3. 6 metastatic right axillary lymph nodes. RECOMMENDATION: MR guided core needle biopsy of the 1.6 cm area of non mass enhancement in the lower outer quadrant of the left breast. BI-RADS CATEGORY  4: Suspicious. Electronically Signed   By: Claudie Revering M.D.   On: 03/27/2021 16:27  DG Chest Port 1 View  Result Date: 04/02/2021 CLINICAL DATA:  Postop evaluation following Port-A-Cath placement. EXAM: PORTABLE CHEST 1 VIEW COMPARISON:  Chest abdomen pelvis CT of 04/01/2021. FINDINGS: LEFT-sided Port-A-Cath has been place since previous imaging via LEFT internal jugular approach. Tip at the caval to atrial junction. Cardiomediastinal contours and hilar structures are normal. EKG leads project over the chest. No sign of pneumothorax. No sign of consolidation or sign of pleural effusion. On limited assessment there is no acute skeletal process. IMPRESSION: LEFT-sided Port-A-Cath  in good position. No pneumothorax. No acute cardiopulmonary disease. Electronically Signed   By: Zetta Bills M.D.   On: 04/02/2021 16:57   DG C-Arm 1-60 Min-No Report  Result Date: 04/02/2021 Fluoroscopy was utilized by the requesting physician.  No radiographic interpretation.   ECHOCARDIOGRAM COMPLETE  Result Date: 03/31/2021    ECHOCARDIOGRAM REPORT   Patient Name:   KALAYA INFANTINO Date of Exam: 03/31/2021 Medical Rec #:  [id number]       Height:       66.0 in Accession #:    [id number]      Weight:       186.3 lb Date of Birth:  04/15/1966       BSA:          1.940  m Patient Age:    55 years        BP:           115/76 mmHg Patient Gender: F               HR:           71 bpm. Exam Location:  [facility] Procedure: 2D Echo, Cardiac Doppler, Color Doppler and Strain Analysis Indications:     Chemo Z09  History:         Patient has no prior history of Echocardiogram examinations.                  Breast cancer, mild renal insufficiency.  Sonographer:     Sherrie Sport Referring Phys:  [id number] [facility] C Margarie Mcguirt Diagnosing Phys: Kate Sable MD  Sonographer Comments: Global longitudinal strain was attempted. IMPRESSIONS  1. Left ventricular ejection fraction, by estimation, is 60 to 65%. The left ventricle has normal function. The left ventricle has no regional wall motion abnormalities. There is mild left ventricular hypertrophy. Left ventricular diastolic parameters are consistent with Grade I diastolic dysfunction (impaired relaxation).  2. Right ventricular systolic function is normal. The right ventricular size is normal.  3. The mitral valve is normal in structure. No evidence of mitral valve regurgitation.  4. The aortic valve is tricuspid. Aortic valve regurgitation is not visualized.  5. The inferior vena cava is normal in size with greater than 50% respiratory variability, suggesting right atrial pressure of 3 mmHg. FINDINGS  Left Ventricle: Left ventricular ejection fraction, by estimation, is 60 to 65%.  The left ventricle has normal function. The left ventricle has no regional wall motion abnormalities. Global longitudinal strain performed but not reported based on interpreter judgement due to suboptimal tracking. 3D left ventricular ejection fraction analysis performed but not reported based on interpreter judgement due to suboptimal quality. The left ventricular internal cavity size was normal in size. There is mild left ventricular hypertrophy. Left ventricular diastolic parameters are consistent with Grade I diastolic dysfunction (impaired relaxation). Right Ventricle: The right ventricular size is normal. No increase in right ventricular wall thickness. Right ventricular systolic function is normal. Left Atrium: Left atrial size was normal in size. Right Atrium: Right atrial size was normal in size. Pericardium: There is no evidence of pericardial effusion. Mitral Valve: The mitral valve is normal in structure. No evidence of mitral valve regurgitation. MV peak gradient, 2.2 mmHg. The mean mitral valve gradient is 1.0 mmHg. Tricuspid Valve: The tricuspid valve is normal in structure. Tricuspid valve regurgitation is not demonstrated. Aortic Valve: The aortic valve is tricuspid. Aortic valve regurgitation is not visualized. Aortic valve mean gradient measures 2.5 mmHg. Aortic valve peak gradient measures 5.3 mmHg. Aortic valve area, by VTI measures 2.34 cm. Pulmonic Valve: The pulmonic valve was normal in structure. Pulmonic valve regurgitation is not visualized. Aorta: The aortic root is normal in size and structure. Venous: The inferior vena cava is normal in size with greater than 50% respiratory variability, suggesting right atrial pressure of 3 mmHg. IAS/Shunts: No atrial level shunt detected by color flow Doppler.  LEFT VENTRICLE PLAX 2D LVIDd:         4.17 cm   Diastology LVIDs:         2.45 cm   LV e' medial:    7.62 cm/s LV PW:         1.16 cm   LV E/e' medial:  7.5 LV IVS:  0.91 cm   LV e'  lateral:   11.30 cm/s LVOT diam:     2.00 cm   LV E/e' lateral: 5.1 LV SV:         51 LV SV Index:   26 LVOT Area:     3.14 cm                           3D Volume EF:                          3D EF:        55 %                          LV EDV:       111 ml                          LV ESV:       50 ml                          LV SV:        61 ml RIGHT VENTRICLE RV Basal diam:  3.19 cm RV S prime:     12.70 cm/s TAPSE (M-mode): 4.0 cm LEFT ATRIUM             Index        RIGHT ATRIUM           Index LA diam:        3.00 cm 1.55 cm/m   RA Area:     12.50 cm LA Vol (A2C):   25.1 ml 12.94 ml/m  RA Volume:   29.70 ml  15.31 ml/m LA Vol (A4C):   26.8 ml 13.81 ml/m LA Biplane Vol: 28.6 ml 14.74 ml/m  AORTIC VALVE                    PULMONIC VALVE AV Area (Vmax):    2.09 cm     PV Vmax:        0.53 m/s AV Area (Vmean):   2.11 cm     PV Vmean:       37.450 cm/s AV Area (VTI):     2.34 cm     PV VTI:         0.102 m AV Vmax:           115.00 cm/s  PV Peak grad:   1.1 mmHg AV Vmean:          76.700 cm/s  PV Mean grad:   1.0 mmHg AV VTI:            0.217 m      RVOT Peak grad: 3 mmHg AV Peak Grad:      5.3 mmHg AV Mean Grad:      2.5 mmHg LVOT Vmax:         76.40 cm/s LVOT Vmean:        51.600 cm/s LVOT VTI:          0.161 m LVOT/AV VTI ratio: 0.74  AORTA Ao Root diam: 2.90 cm MITRAL VALVE               TRICUSPID VALVE MV Area (PHT): 3.60 cm    TR Peak grad:   18.8 mmHg MV Area VTI:  2.70 cm    TR Vmax:        217.00 cm/s MV Peak grad:  2.2 mmHg MV Mean grad:  1.0 mmHg    SHUNTS MV Vmax:       0.75 m/s    Systemic VTI:  0.16 m MV Vmean:      49.5 cm/s   Systemic Diam: 2.00 cm MV Decel Time: 211 msec    Pulmonic VTI:  0.168 m MV E velocity: 57.40 cm/s MV A velocity: 68.10 cm/s MV E/A ratio:  0.84 Kate Sable MD Electronically signed by Kate Sable MD Signature Date/Time: 03/31/2021/1:07:06 PM    Final    MM CLIP PLACEMENT RIGHT  Result Date: 03/14/2021 CLINICAL DATA:  Evaluate post biopsy marker clip  placement following stereotactic core needle biopsy of the anterior-posterior extents of right breast calcifications and ultrasound-guided core needle biopsy of a right axillary lymph node. EXAM: 3D DIAGNOSTIC RIGHT MAMMOGRAM POST STEREOTACTIC AND ULTRASOUND BIOPSY COMPARISON:  Previous exam(s). FINDINGS: 3D Mammographic images were obtained following stereotactic and ultrasound guided biopsy of the anterior-posterior extents of right breast calcifications and an abnormal right axillary lymph node. The biopsy marking clips for the calcifications are well positioned. The [facility] biopsy clip projects 1.5 cm anterior to the biopsied lymph node. IMPRESSION: Appropriate positioning of the coil and ribbon shaped biopsy marking clip at the site of biopsy in the upper outer quadrant of the right breast adjacent to residual calcifications. HydroMARK biopsy clip lies 1.5 cm anterior to the biopsied right axillary lymph node. Final Assessment: Post Procedure Mammograms for Marker Placement Electronically Signed   By: Lajean Manes M.D.   On: 03/14/2021 09:43  MM RT BREAST BX W LOC DEV 1ST LESION IMAGE BX SPEC STEREO GUIDE  Addendum Date: 03/17/2021   ADDENDUM REPORT: 03/17/2021 13:01 ADDENDUM: PATHOLOGY revealed: Site A. RIGHT BREAST, ANTERIOR EXTENT CALCIFICATIONS; STEREOTACTIC BIOPSY: - INVASIVE MAMMARY CARCINOMA, NO SPECIAL TYPE. Size of invasive carcinoma: 15 mm in this sample. Grade 3. Ductal carcinoma in situ: Present, high-grade with comedonecrosis. Lymphovascular invasion: Not identified. Pathology results are CONCORDANT with imaging findings, per Dr. Lajean Manes. PATHOLOGY revealed: Site B. RIGHT BREAST, POSTERIOR EXTENT CALCIFICATIONS; STEREOTACTIC BIOPSY: - INVASIVE MAMMARY CARCINOMA, AS DESCRIBED ABOVE, AT LEAST 17 MM. Pathology results are CONCORDANT with imaging findings, per Dr. Lajean Manes. PATHOLOGY revealed: Site C. LYMPH NODE, RIGHT AXILLA; ULTRASOUND-GUIDED BIOPSY: - METASTATIC MAMMARY CARCINOMA,  AT LEAST 12 MM. Pathology results are CONCORDANT with imaging findings, per Dr. Lajean Manes. Pathology results and recommendations below were discussed with patient by provider Derinda Late) via telephone on 03/17/2021. Provider will arrange surgical and oncological consultation for patient. Biopsy site assessed by Electa Sniff RN on 03/17/2021; patient reported biopsy site within normal limits with slight tenderness at the site. Post biopsy care instructions were reviewed, questions were answered and my direct phone number was provided to patient. Patient was instructed to call [facility] if any concerns or questions arise related to the biopsy. RECOMMENDATIONS: 1. Surgical and oncological consultation. Al Pimple RN at [facility] was notified on 03/17/2021 that provider (Dr. Derinda Late) will arrange surgical and oncological consultation, and inform patient of appointment details. 2. Consider bilateral breast MRI to evaluate extent of breast disease. Pathology results reported by Electa Sniff RN on 03/17/2021. Electronically Signed   By: Lajean Manes M.D.   On: 03/17/2021 13:01   Result Date: 03/17/2021 CLINICAL DATA:  Patient presents for stereotactic core needle biopsy of the anterior-posterior extents of suspicious  right breast calcifications as well as an ultrasound-guided core needle biopsy of 1 of 2 abnormal right axillary lymph nodes. EXAM: RIGHT BREAST STEREOTACTIC CORE NEEDLE BIOPSY: 2 BIOPSIES PERFORMED. RIGHT AXILLARY LYMPH NODE ULTRASOUND-GUIDED CORE NEEDLE BIOPSY COMPARISON:  Previous exams. FINDINGS: Stereotactic biopsies The patient and I discussed the procedure of stereotactic-guided biopsy including benefits and alternatives. We discussed the high likelihood of a successful procedure. We discussed the risks of the procedure including infection, bleeding, tissue injury, clip migration, and inadequate sampling. Informed written consent was given. The usual  time out protocol was performed immediately prior to the procedure. Biopsy #1: Anterior extent of upper outer quadrant calcifications. Using sterile technique and 1% Lidocaine as local anesthetic, under stereotactic guidance, a 9 gauge vacuum assisted device was used to perform core needle biopsy of calcifications in the upper outer quadrant of the right breast using a lateral approach. Specimen radiograph was performed showing multiple calcifications for which biopsy was performed. Specimens with calcifications are identified for pathology. Lesion quadrant: Upper outer quadrant At the conclusion of the procedure, a coil shaped shaped tissue marker clip was deployed into the biopsy cavity. Biopsy #2: Posterior extent of upper outer quadrant calcifications. Using sterile technique and 1% Lidocaine as local anesthetic, under stereotactic guidance, a 9 gauge vacuum assisted device was used to perform core needle biopsy of calcifications in the upper outer quadrant of the right breast using a lateral approach. Specimen radiograph was performed showing multiple calcifications for which biopsy was performed. Specimens with calcifications are identified for pathology. Lesion quadrant: Upper outer quadrant At the conclusion of the procedure, a ribbon shaped tissue marker clip was deployed into the biopsy cavity. Ultrasound-guided biopsy. I met with the patient and we discussed the procedure of ultrasound-guided biopsy, including benefits and alternatives. We discussed the high likelihood of a successful procedure. We discussed the risks of the procedure, including infection, bleeding, tissue injury, clip migration, and inadequate sampling. Informed written consent was given. The usual time-out protocol was performed immediately prior to the procedure. Lesion quadrant: Upper outer quadrant, bordering the anterior inferior right axilla. Using sterile technique and 1% Lidocaine as local anesthetic, under direct ultrasound  visualization, a 14 gauge spring-loaded device was used to perform biopsy of the abnormal lymph node in the far upper outer quadrant of the right breast/anterior inferior right axilla using an inferior approach. At the conclusion of the procedure a HydroMARK tissue marker clip was deployed into the biopsy cavity. Follow-up 2-view mammogram was performed and dictated separately. IMPRESSION: Stereotactic-guided biopsy of the anterior and posterior extents of suspicious right breast calcifications. Ultrasound-guided core needle biopsy of 1 of 2 abnormal right axillary lymph nodes. No apparent complications. Electronically Signed: By: Lajean Manes M.D. On: 03/14/2021 09:31  MM RT BREAST BX W LOC DEV EA AD LESION IMG BX SPEC STEREO GUIDE  Addendum Date: 03/17/2021   ADDENDUM REPORT: 03/17/2021 13:01 ADDENDUM: PATHOLOGY revealed: Site A. RIGHT BREAST, ANTERIOR EXTENT CALCIFICATIONS; STEREOTACTIC BIOPSY: - INVASIVE MAMMARY CARCINOMA, NO SPECIAL TYPE. Size of invasive carcinoma: 15 mm in this sample. Grade 3. Ductal carcinoma in situ: Present, high-grade with comedonecrosis. Lymphovascular invasion: Not identified. Pathology results are CONCORDANT with imaging findings, per Dr. Lajean Manes. PATHOLOGY revealed: Site B. RIGHT BREAST, POSTERIOR EXTENT CALCIFICATIONS; STEREOTACTIC BIOPSY: - INVASIVE MAMMARY CARCINOMA, AS DESCRIBED ABOVE, AT LEAST 17 MM. Pathology results are CONCORDANT with imaging findings, per Dr. Lajean Manes. PATHOLOGY revealed: Site C. LYMPH NODE, RIGHT AXILLA; ULTRASOUND-GUIDED BIOPSY: - METASTATIC MAMMARY CARCINOMA, AT LEAST 12 MM. Pathology  results are CONCORDANT with imaging findings, per Dr. Lajean Manes. Pathology results and recommendations below were discussed with patient by provider Derinda Late) via telephone on 03/17/2021. Provider will arrange surgical and oncological consultation for patient. Biopsy site assessed by Electa Sniff RN on 03/17/2021; patient reported biopsy site within  normal limits with slight tenderness at the site. Post biopsy care instructions were reviewed, questions were answered and my direct phone number was provided to patient. Patient was instructed to call [facility] if any concerns or questions arise related to the biopsy. RECOMMENDATIONS: 1. Surgical and oncological consultation. Al Pimple RN at [facility] was notified on 03/17/2021 that provider (Dr. Derinda Late) will arrange surgical and oncological consultation, and inform patient of appointment details. 2. Consider bilateral breast MRI to evaluate extent of breast disease. Pathology results reported by Electa Sniff RN on 03/17/2021. Electronically Signed   By: Lajean Manes M.D.   On: 03/17/2021 13:01   Result Date: 03/17/2021 CLINICAL DATA:  Patient presents for stereotactic core needle biopsy of the anterior-posterior extents of suspicious right breast calcifications as well as an ultrasound-guided core needle biopsy of 1 of 2 abnormal right axillary lymph nodes. EXAM: RIGHT BREAST STEREOTACTIC CORE NEEDLE BIOPSY: 2 BIOPSIES PERFORMED. RIGHT AXILLARY LYMPH NODE ULTRASOUND-GUIDED CORE NEEDLE BIOPSY COMPARISON:  Previous exams. FINDINGS: Stereotactic biopsies The patient and I discussed the procedure of stereotactic-guided biopsy including benefits and alternatives. We discussed the high likelihood of a successful procedure. We discussed the risks of the procedure including infection, bleeding, tissue injury, clip migration, and inadequate sampling. Informed written consent was given. The usual time out protocol was performed immediately prior to the procedure. Biopsy #1: Anterior extent of upper outer quadrant calcifications. Using sterile technique and 1% Lidocaine as local anesthetic, under stereotactic guidance, a 9 gauge vacuum assisted device was used to perform core needle biopsy of calcifications in the upper outer quadrant of the right breast using a lateral  approach. Specimen radiograph was performed showing multiple calcifications for which biopsy was performed. Specimens with calcifications are identified for pathology. Lesion quadrant: Upper outer quadrant At the conclusion of the procedure, a coil shaped shaped tissue marker clip was deployed into the biopsy cavity. Biopsy #2: Posterior extent of upper outer quadrant calcifications. Using sterile technique and 1% Lidocaine as local anesthetic, under stereotactic guidance, a 9 gauge vacuum assisted device was used to perform core needle biopsy of calcifications in the upper outer quadrant of the right breast using a lateral approach. Specimen radiograph was performed showing multiple calcifications for which biopsy was performed. Specimens with calcifications are identified for pathology. Lesion quadrant: Upper outer quadrant At the conclusion of the procedure, a ribbon shaped tissue marker clip was deployed into the biopsy cavity. Ultrasound-guided biopsy. I met with the patient and we discussed the procedure of ultrasound-guided biopsy, including benefits and alternatives. We discussed the high likelihood of a successful procedure. We discussed the risks of the procedure, including infection, bleeding, tissue injury, clip migration, and inadequate sampling. Informed written consent was given. The usual time-out protocol was performed immediately prior to the procedure. Lesion quadrant: Upper outer quadrant, bordering the anterior inferior right axilla. Using sterile technique and 1% Lidocaine as local anesthetic, under direct ultrasound visualization, a 14 gauge spring-loaded device was used to perform biopsy of the abnormal lymph node in the far upper outer quadrant of the right breast/anterior inferior right axilla using an inferior approach. At the conclusion of the procedure a HydroMARK tissue marker clip was  deployed into the biopsy cavity. Follow-up 2-view mammogram was performed and dictated separately.  IMPRESSION: Stereotactic-guided biopsy of the anterior and posterior extents of suspicious right breast calcifications. Ultrasound-guided core needle biopsy of 1 of 2 abnormal right axillary lymph nodes. No apparent complications. Electronically Signed: By: Lajean Manes M.D. On: 03/14/2021 09:31  [place] RT BREAST BX W LOC DEV 1ST LESION IMG BX SPEC US GUIDE  Addendum Date: 03/17/2021   ADDENDUM REPORT: 03/17/2021 13:01 ADDENDUM: PATHOLOGY revealed: Site A. RIGHT BREAST, ANTERIOR EXTENT CALCIFICATIONS; STEREOTACTIC BIOPSY: - INVASIVE MAMMARY CARCINOMA, NO SPECIAL TYPE. Size of invasive carcinoma: 15 mm in this sample. Grade 3. Ductal carcinoma in situ: Present, high-grade with comedonecrosis. Lymphovascular invasion: Not identified. Pathology results are CONCORDANT with imaging findings, per Dr. Lajean Manes. PATHOLOGY revealed: Site B. RIGHT BREAST, POSTERIOR EXTENT CALCIFICATIONS; STEREOTACTIC BIOPSY: - INVASIVE MAMMARY CARCINOMA, AS DESCRIBED ABOVE, AT LEAST 17 MM. Pathology results are CONCORDANT with imaging findings, per Dr. Lajean Manes. PATHOLOGY revealed: Site C. LYMPH NODE, RIGHT AXILLA; ULTRASOUND-GUIDED BIOPSY: - METASTATIC MAMMARY CARCINOMA, AT LEAST 12 MM. Pathology results are CONCORDANT with imaging findings, per Dr. Lajean Manes. Pathology results and recommendations below were discussed with patient by provider Derinda Late) via telephone on 03/17/2021. Provider will arrange surgical and oncological consultation for patient. Biopsy site assessed by Electa Sniff RN on 03/17/2021; patient reported biopsy site within normal limits with slight tenderness at the site. Post biopsy care instructions were reviewed, questions were answered and my direct phone number was provided to patient. Patient was instructed to call [facility] if any concerns or questions arise related to the biopsy. RECOMMENDATIONS: 1. Surgical and oncological consultation. Al Pimple RN at [facility] was notified on 03/17/2021 that provider (Dr. Derinda Late) will arrange surgical and oncological consultation, and inform patient of appointment details. 2. Consider bilateral breast MRI to evaluate extent of breast disease. Pathology results reported by Electa Sniff RN on 03/17/2021. Electronically Signed   By: Lajean Manes M.D.   On: 03/17/2021 13:01   Result Date: 03/17/2021 CLINICAL DATA:  Patient presents for stereotactic core needle biopsy of the anterior-posterior extents of suspicious right breast calcifications as well as an ultrasound-guided core needle biopsy of 1 of 2 abnormal right axillary lymph nodes. EXAM: RIGHT BREAST STEREOTACTIC CORE NEEDLE BIOPSY: 2 BIOPSIES PERFORMED. RIGHT AXILLARY LYMPH NODE ULTRASOUND-GUIDED CORE NEEDLE BIOPSY COMPARISON:  Previous exams. FINDINGS: Stereotactic biopsies The patient and I discussed the procedure of stereotactic-guided biopsy including benefits and alternatives. We discussed the high likelihood of a successful procedure. We discussed the risks of the procedure including infection, bleeding, tissue injury, clip migration, and inadequate sampling. Informed written consent was given. The usual time out protocol was performed immediately prior to the procedure. Biopsy #1: Anterior extent of upper outer quadrant calcifications. Using sterile technique and 1% Lidocaine as local anesthetic, under stereotactic guidance, a 9 gauge vacuum assisted device was used to perform core needle biopsy of calcifications in the upper outer quadrant of the right breast using a lateral approach. Specimen radiograph was performed showing multiple calcifications for which biopsy was performed. Specimens with calcifications are identified for pathology. Lesion quadrant: Upper outer quadrant At the conclusion of the procedure, a coil shaped shaped tissue marker clip was deployed into the biopsy cavity. Biopsy #2: Posterior extent of upper outer quadrant calcifications. Using  sterile technique and 1% Lidocaine as local anesthetic, under stereotactic guidance, a 9 gauge vacuum assisted device was used to perform core needle biopsy of calcifications  in the upper outer quadrant of the right breast using a lateral approach. Specimen radiograph was performed showing multiple calcifications for which biopsy was performed. Specimens with calcifications are identified for pathology. Lesion quadrant: Upper outer quadrant At the conclusion of the procedure, a ribbon shaped tissue marker clip was deployed into the biopsy cavity. Ultrasound-guided biopsy. I met with the patient and we discussed the procedure of ultrasound-guided biopsy, including benefits and alternatives. We discussed the high likelihood of a successful procedure. We discussed the risks of the procedure, including infection, bleeding, tissue injury, clip migration, and inadequate sampling. Informed written consent was given. The usual time-out protocol was performed immediately prior to the procedure. Lesion quadrant: Upper outer quadrant, bordering the anterior inferior right axilla. Using sterile technique and 1% Lidocaine as local anesthetic, under direct ultrasound visualization, a 14 gauge spring-loaded device was used to perform biopsy of the abnormal lymph node in the far upper outer quadrant of the right breast/anterior inferior right axilla using an inferior approach. At the conclusion of the procedure a HydroMARK tissue marker clip was deployed into the biopsy cavity. Follow-up 2-view mammogram was performed and dictated separately. IMPRESSION: Stereotactic-guided biopsy of the anterior and posterior extents of suspicious right breast calcifications. Ultrasound-guided core needle biopsy of 1 of 2 abnormal right axillary lymph nodes. No apparent complications. Electronically Signed: By: Lajean Manes M.D. On: 03/14/2021 09:31    Assessment and plan- Patient is a 55 y.o. 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  Both anterior and posterior ends of these calcifications are biopsy-proven invasive mammary carcinoma ER/PR positive and HER2 negative.  Overall this is high-grade histology with a Ki-67 of 40%.  She will therefore proceed with cycle 1 of neoadjuvant dose dense AC chemotherapy today and return to clinic in 2 weeks to receive the Udenyca.  Baseline echocardiogram normal.  Again discussed risks and benefits of this chemotherapy including all but not limited to nausea, vomiting, low blood counts, risk of infections and hospitalizations as well as cardiotoxicity associated with anthracyclines.  Treatment will be given with a curative intent.  Patient understands and agrees to proceed as planned.  MRI also showed a 1.6 x 1.3 x 0.8 cm area of abnormal calcifications in the lower outer quadrant of the left breast which was not seen on mammogram.  I will plan to biopsy it prior to her next cycle of chemotherapy.  CT scans did not show any evidence of distant metastatic disease and only showed locally advanced disease with metastatic axillary adenopathy.  She will be getting a bone scan tomorrow.   Visit Diagnosis 1. Encounter for antineoplastic chemotherapy   2. Breast cancer, stage 1, estrogen receptor positive, right ([place])      Dr. Randa Evens, MD, MPH [facility] at [facility] [phone] 04/07/2021 9:07 AM

## 2021-04-08 ENCOUNTER — Telehealth: Payer: Self-pay

## 2021-04-08 ENCOUNTER — Encounter
Admission: RE | Admit: 2021-04-08 | Discharge: 2021-04-08 | Disposition: A | Discharge status: Home / Self Care | Payer: Managed Care, Other (non HMO) | Source: Ambulatory Visit | Attending: Oncology | Admitting: Oncology

## 2021-04-08 ENCOUNTER — Ambulatory Visit: Payer: Managed Care, Other (non HMO)

## 2021-04-08 DIAGNOSIS — C50919 Malignant neoplasm of unspecified site of unspecified female breast: Secondary | ICD-10-CM | POA: Insufficient documentation

## 2021-04-08 LAB — HEPATITIS B CORE ANTIBODY, TOTAL: Hep B Core Total Ab: NONREACTIVE

## 2021-04-08 LAB — FOLLICLE STIMULATING HORMONE: FSH: 92.1 m[IU]/mL

## 2021-04-08 LAB — ESTRADIOL: Estradiol: <5 pg/mL

## 2021-04-08 LAB — HEPATITIS B SURFACE ANTIBODY, QUANTITATIVE: Hep B S AB Quant (Post): <3.1 m[IU]/mL — ABNORMAL LOW (ref >9.9)

## 2021-04-08 MED ORDER — TECHNETIUM TC 99M MEDRONATE IV KIT
20.0000 | PACK | Freq: Once | INTRAVENOUS | Status: AC | PRN
  Administered 2021-04-08: 21.8 via INTRAVENOUS

## 2021-04-08 NOTE — Telephone Encounter (Signed)
Telephone call to patient for follow up after receiving first infusion.   Patient states infusion went great.  States eating good and drinking plenty of fluids.   Denies any nausea or vomiting.  Encouraged patient to call for any concerns or questions. 

## 2021-04-09 ENCOUNTER — Other Ambulatory Visit: Payer: Managed Care, Other (non HMO)

## 2021-04-09 ENCOUNTER — Inpatient Hospital Stay: Payer: Managed Care, Other (non HMO)

## 2021-04-09 ENCOUNTER — Ambulatory Visit: Payer: Managed Care, Other (non HMO)

## 2021-04-09 ENCOUNTER — Ambulatory Visit: Payer: Managed Care, Other (non HMO) | Admitting: Oncology

## 2021-04-09 ENCOUNTER — Other Ambulatory Visit: Payer: Self-pay

## 2021-04-09 DIAGNOSIS — Z170 Estrogen receptor positive status [ER+]: Secondary | ICD-10-CM

## 2021-04-09 DIAGNOSIS — C50911 Malignant neoplasm of unspecified site of right female breast: Secondary | ICD-10-CM

## 2021-04-09 DIAGNOSIS — Z5111 Encounter for antineoplastic chemotherapy: Secondary | ICD-10-CM | POA: Diagnosis not present

## 2021-04-09 MED ORDER — PEGFILGRASTIM-BMEZ 6 MG/0.6ML ~~LOC~~ SOSY
6.0000 mg | PREFILLED_SYRINGE | Freq: Once | SUBCUTANEOUS | Status: AC
  Administered 2021-04-09: 6 mg via SUBCUTANEOUS
  Filled 2021-04-09: qty 0.6

## 2021-04-11 ENCOUNTER — Encounter: Payer: Self-pay | Admitting: Oncology

## 2021-04-11 ENCOUNTER — Telehealth: Payer: Self-pay | Admitting: *Deleted

## 2021-04-11 NOTE — Telephone Encounter (Signed)
Telephone number for MD line Physician line [phone]. I called an gave verbal info for the Ziextenzo for 3 injections every 2 weeks after [facility] chemo and they give the inj. 24 to 72 hours after each chemo. I was told from staff on the phone that they already have the nursing set up.

## 2021-04-21 ENCOUNTER — Inpatient Hospital Stay (HOSPITAL_BASED_OUTPATIENT_CLINIC_OR_DEPARTMENT_OTHER): Payer: Managed Care, Other (non HMO) | Admitting: Oncology

## 2021-04-21 ENCOUNTER — Inpatient Hospital Stay: Payer: Managed Care, Other (non HMO)

## 2021-04-21 ENCOUNTER — Encounter: Payer: Self-pay | Admitting: Oncology

## 2021-04-21 ENCOUNTER — Encounter: Payer: Self-pay | Admitting: *Deleted

## 2021-04-21 ENCOUNTER — Telehealth: Payer: Self-pay | Admitting: *Deleted

## 2021-04-21 ENCOUNTER — Other Ambulatory Visit: Payer: Self-pay

## 2021-04-21 VITALS — BP 102/84 | HR 77 | Temp 98.2°F | Resp 16 | Wt 181.0 lb

## 2021-04-21 DIAGNOSIS — C50911 Malignant neoplasm of unspecified site of right female breast: Secondary | ICD-10-CM | POA: Diagnosis not present

## 2021-04-21 DIAGNOSIS — L309 Dermatitis, unspecified: Secondary | ICD-10-CM | POA: Diagnosis not present

## 2021-04-21 DIAGNOSIS — Z5111 Encounter for antineoplastic chemotherapy: Secondary | ICD-10-CM | POA: Diagnosis not present

## 2021-04-21 DIAGNOSIS — Z170 Estrogen receptor positive status [ER+]: Secondary | ICD-10-CM | POA: Diagnosis not present

## 2021-04-21 LAB — COMPREHENSIVE METABOLIC PANEL
ALT: 21 U/L (ref 0–44)
AST: 18 U/L (ref 15–41)
Albumin: 4.2 g/dL (ref 3.5–5.0)
Alkaline Phosphatase: 78 U/L (ref 38–126)
Anion gap: 5 (ref 5–15)
BUN: 18 mg/dL (ref 6–20)
CO2: 29 mmol/L (ref 22–32)
Calcium: 8.9 mg/dL (ref 8.9–10.3)
Chloride: 105 mmol/L (ref 98–111)
Creatinine, Ser: 0.91 mg/dL (ref 0.44–1.00)
GFR, Estimated: >60 mL/min (ref >60)
Glucose, Bld: 98 mg/dL (ref 70–99)
Potassium: 3.8 mmol/L (ref 3.5–5.1)
Sodium: 139 mmol/L (ref 135–145)
Total Bilirubin: 0.2 mg/dL — ABNORMAL LOW (ref 0.3–1.2)
Total Protein: 7 g/dL (ref 6.5–8.1)

## 2021-04-21 LAB — CBC WITH DIFFERENTIAL/PLATELET
Abs Immature Granulocytes: 0.68 10*3/uL — ABNORMAL HIGH (ref 0.00–0.07)
Basophils Absolute: 0.1 10*3/uL (ref 0.0–0.1)
Basophils Relative: 1 %
Eosinophils Absolute: 0 10*3/uL (ref 0.0–0.5)
Eosinophils Relative: 0 %
HCT: 36 % (ref 36.0–46.0)
Hemoglobin: 11.4 g/dL — ABNORMAL LOW (ref 12.0–15.0)
Immature Granulocytes: 9 %
Lymphocytes Relative: 15 %
Lymphs Abs: 1.2 10*3/uL (ref 0.7–4.0)
MCH: 27.5 pg (ref 26.0–34.0)
MCHC: 31.7 g/dL (ref 30.0–36.0)
MCV: 87 fL (ref 80.0–100.0)
Monocytes Absolute: 1 10*3/uL (ref 0.1–1.0)
Monocytes Relative: 12 %
Neutro Abs: 5.2 10*3/uL (ref 1.7–7.7)
Neutrophils Relative %: 63 %
Platelets: 281 10*3/uL (ref 150–400)
RBC: 4.14 MIL/uL (ref 3.87–5.11)
RDW: 13.2 % (ref 11.5–15.5)
Smear Review: NORMAL
WBC: 8 10*3/uL (ref 4.0–10.5)
nRBC: 0.5 % — ABNORMAL HIGH (ref 0.0–0.2)

## 2021-04-21 MED ORDER — SODIUM CHLORIDE 0.9 % IV SOLN
10.0000 mg | Freq: Once | INTRAVENOUS | Status: AC
  Administered 2021-04-21: 12:00:00 10 mg via INTRAVENOUS
  Filled 2021-04-21: qty 10

## 2021-04-21 MED ORDER — HEPARIN SOD (PORK) LOCK FLUSH 100 UNIT/ML IV SOLN
500.0000 [IU] | Freq: Once | INTRAVENOUS | Status: AC | PRN
  Filled 2021-04-21: qty 5

## 2021-04-21 MED ORDER — AMOXICILLIN-POT CLAVULANATE 875-125 MG PO TABS: 1.0000 | ORAL_TABLET | Freq: Two times a day (BID) | ORAL | 0 refills | Status: DC

## 2021-04-21 MED ORDER — TRIAMCINOLONE ACETONIDE 0.5 % EX OINT: 1.0000 "application " | TOPICAL_OINTMENT | Freq: Two times a day (BID) | CUTANEOUS | 0 refills | Status: DC

## 2021-04-21 MED ORDER — DOXORUBICIN HCL CHEMO IV INJECTION 2 MG/ML
116.0000 mg | Freq: Once | INTRAVENOUS | Status: AC
  Administered 2021-04-21: 13:00:00 116 mg via INTRAVENOUS
  Filled 2021-04-21: qty 50

## 2021-04-21 MED ORDER — SODIUM CHLORIDE 0.9 % IV SOLN
1160.0000 mg | Freq: Once | INTRAVENOUS | Status: AC
  Administered 2021-04-21: 13:00:00 1160 mg via INTRAVENOUS
  Filled 2021-04-21: qty 58

## 2021-04-21 MED ORDER — SODIUM CHLORIDE 0.9 % IV SOLN
150.0000 mg | Freq: Once | INTRAVENOUS | Status: AC
  Administered 2021-04-21: 12:00:00 150 mg via INTRAVENOUS
  Filled 2021-04-21: qty 150

## 2021-04-21 MED ORDER — PALONOSETRON HCL INJECTION 0.25 MG/5ML
0.2500 mg | Freq: Once | INTRAVENOUS | Status: AC
  Administered 2021-04-21: 12:00:00 0.25 mg via INTRAVENOUS
  Filled 2021-04-21: qty 5

## 2021-04-21 MED ORDER — HEPARIN SOD (PORK) LOCK FLUSH 100 UNIT/ML IV SOLN
INTRAVENOUS | Status: AC
  Administered 2021-04-21: 14:00:00 500 [IU]
  Filled 2021-04-21: qty 5

## 2021-04-21 MED ORDER — SODIUM CHLORIDE 0.9 % IV SOLN
Freq: Once | INTRAVENOUS | Status: AC
Start: 2021-04-21 — End: 2021-04-21
  Filled 2021-04-21: qty 250

## 2021-04-21 NOTE — Telephone Encounter (Signed)
Received incoming fax from [place] at [facility] to complete Oncology Treatment plan form. Form has bene completed and faxed back to [facility]

## 2021-04-21 NOTE — Progress Notes (Signed)
Pt. have a rash around her port area no itchy

## 2021-04-21 NOTE — Progress Notes (Signed)
Per MD to proceed with treatment through port. RN educated pt on the importance of notifying the clinic if the rash around her port becomes worse or if complications occur at home. Pt verbalized understanding and states she has no discomfort with her port at this time. Port flushes with ease and blood return noted.  Pt tolerated treatment well with no signs of complications. Pt stable at discharge, vitals stable.   Emma Cuevas [employer]

## 2021-04-21 NOTE — Patient Instructions (Signed)
[facility]  Discharge Instructions: Thank you for choosing [facility] to provide your oncology and hematology care.  If you have a lab appointment with the [facility], please go directly to the [facility] and check in at the registration area.  Wear comfortable clothing and clothing appropriate for easy access to any Portacath or PICC line.   We strive to give you quality time with your provider. You may need to reschedule your appointment if you arrive late (15 or more minutes).  Arriving late affects you and other patients whose appointments are after yours.  Also, if you miss three or more appointments without notifying the office, you may be dismissed from the clinic at the provider's discretion.      For prescription refill requests, have your pharmacy contact our office and allow 72 hours for refills to be completed.    Today you received the following chemotherapy and/or immunotherapy agents adriamycin, cytoxan    To help prevent nausea and vomiting after your treatment, we encourage you to take your nausea medication as directed.  BELOW ARE SYMPTOMS THAT SHOULD BE REPORTED IMMEDIATELY: *FEVER GREATER THAN 100.4 F (38 C) OR HIGHER *CHILLS OR SWEATING *NAUSEA AND VOMITING THAT IS NOT CONTROLLED WITH YOUR NAUSEA MEDICATION *UNUSUAL SHORTNESS OF BREATH *UNUSUAL BRUISING OR BLEEDING *URINARY PROBLEMS (pain or burning when urinating, or frequent urination) *BOWEL PROBLEMS (unusual diarrhea, constipation, pain near the anus) TENDERNESS IN MOUTH AND THROAT WITH OR WITHOUT PRESENCE OF ULCERS (sore throat, sores in mouth, or a toothache) UNUSUAL RASH, SWELLING OR PAIN  UNUSUAL VAGINAL DISCHARGE OR ITCHING   Items with * indicate a potential emergency and should be followed up as soon as possible or go to the Emergency Department if any problems should occur.  Please show the CHEMOTHERAPY ALERT CARD or IMMUNOTHERAPY ALERT CARD at  check-in to the Emergency Department and triage nurse.  Should you have questions after your visit or need to cancel or reschedule your appointment, please contact [facility]  [phone] and follow the prompts.  Office hours are 8:00 a.m. to 4:30 p.m. Monday - Friday. Please note that voicemails left after 4:00 p.m. may not be returned until the following business day.  We are closed weekends and major holidays. You have access to a nurse at all times for urgent questions. Please call the main number to the clinic [phone] and follow the prompts.  For any non-urgent questions, you may also contact your provider using MyChart. We now offer e-Visits for anyone 70 and older to request care online for non-urgent symptoms. For details visit [email or web address].   Also download the MyChart app! Go to the app store, search "MyChart", open the app, select [facility], and log in with your MyChart username and password.  Due to Covid, a mask is required upon entering the hospital/clinic. If you do not have a mask, one will be given to you upon arrival. For doctor visits, patients may have 1 support person aged 15 or older with them. For treatment visits, patients cannot have anyone with them due to current Covid guidelines and our immunocompromised population.

## 2021-04-21 NOTE — Progress Notes (Signed)
Blistery rash noted to patient's skin over port area. Patient reports it appeared within the past 24 hours. Peripheral labs collected and Dr. Janese Banks and team made aware.

## 2021-04-21 NOTE — Progress Notes (Signed)
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  This was followed by diagnostic mammogram and ultrasound.  Mammogram showed extensive suspicious pleomorphic calcifications measuring up to 6.8 cm.  2 abnormal lymph nodes were seen in the right axilla on ultrasound as well.  Both the calcifications and the lymph node was biopsied.  Anterior and posterior end of the calcifications was consistent with invasive mammary carcinoma grade 3.  Lymph node was positive for metastatic carcinoma as well.  ER 91 to 100% positive PR 41 to 50% positive and HER2 negative.  Ki-67 40%   Menarche at the age of 24.  She is G2, P2 L2.  Age at first birth 64.  She used birth control pills for about 8 to 10 years.  She had a hysterectomy at the age of 1 but ovaries are still in situ.  Family history significant for breast cancer in her mother and paternal aunt.  Father with metastatic bladder cancer and maternal grandfather with pancreatic cancer.  Patient had BRCA 1 and 2 testing done in the past and was reportedly negative.   MRI bilateral breasts showed non-mass enhancement involving the upper outer lower outer and upper inner quadrants  finding 10.9 x 8.4 x 6.8 cm.  Non-mass enhancement in the left breast spanning 1.6 x 1.3 x 0.8 cm.  6 metastatic right axillary lymph nodes.   CT chest abdomen and pelvis with contrast showed mild right axillary adenopathy compatible with metastatic disease but no evidence of distant metastatic disease.  5 mm left lower lobe nodule.  Bone scan was negative for metastatic disease.    Interval history-reports some irritation and itching over the port site with started about a day ago.  Denies any fever.  She otherwise tolerated chemotherapy well without any significant side effects.  ECOG PS- 0 Pain scale- 0   Review of systems- Review of Systems  Constitutional:  Negative for chills, fever, malaise/fatigue and weight loss.  HENT:  Negative for congestion, ear discharge and nosebleeds.   Eyes:  Negative for blurred vision.  Respiratory:  Negative for cough, hemoptysis, sputum production, shortness of breath and wheezing.   Cardiovascular:  Negative for chest pain, palpitations, orthopnea and claudication.  Gastrointestinal:  Negative for abdominal pain, blood in stool, constipation, diarrhea, heartburn, melena, nausea and vomiting.  Genitourinary:  Negative for dysuria, flank pain, frequency, hematuria and urgency.  Musculoskeletal:  Negative for back pain, joint pain and myalgias.  Skin:  Positive for rash.  Neurological:  Negative for dizziness, tingling, focal weakness, seizures, weakness and headaches.  Endo/Heme/Allergies:  Does not bruise/bleed easily.  Psychiatric/Behavioral:  Negative for depression and suicidal ideas. The patient does not have insomnia.      Allergies  Allergen Reactions   Tape     Redness and burning  Past Medical History:  Diagnosis Date   Anemia    Benign neoplasm of breast 2013   left breast   BRCA negative 2015   Cancer [facility])    Family history of malignant neoplasm of breast 2013   Screening for obesity      Past Surgical History:   Procedure Laterality Date   ABDOMINAL HYSTERECTOMY  2011   partial   BREAST BIOPSY Left 2013   BREAST BIOPSY Right 03/14/2021   Stereo bx-anterior calcs, "coil" clip-path pending   BREAST BIOPSY Right 03/14/2021   stereo bx-calcs, "Ribbon" clip-path pending   breast biopsy Right 03/14/2021   [place] Bx, Axilla, path pending   BREAST SURGERY Left 10/16/2011   left breast finesse biopsy   COLONOSCOPY WITH PROPOFOL N/A 12/05/2014   Procedure: COLONOSCOPY WITH PROPOFOL;  Surgeon: Christene Lye, MD;  Location: [facility] ENDOSCOPY;  Service: Endoscopy;  Laterality: N/A;   DILATION AND CURETTAGE OF UTERUS     PORTACATH PLACEMENT N/A 04/02/2021   Procedure: INSERTION PORT-A-CATH;  Surgeon: Herbert Pun, MD;  Location: [facility] ORS;  Service: General;  Laterality: N/A;    Social History   Socioeconomic History   Marital status: Married    Spouse name: Legrand Como   Number of children: Not on file   Years of education: Not on file   Highest education level: Not on file  Occupational History   Not on file  Tobacco Use   Smoking status: Never   Smokeless tobacco: Never  Substance and Sexual Activity   Alcohol use: No   Drug use: No   Sexual activity: Not on file  Other Topics Concern   Not on file  Social History Narrative   Not on file   Social Determinants of Health   Financial Resource Strain: Not on file  Food Insecurity: Not on file  Transportation Needs: Not on file  Physical Activity: Not on file  Stress: Not on file  Social Connections: Not on file  Intimate Partner Violence: Not on file    Family History  Problem Relation Age of Onset   Breast cancer Mother 62   Bladder Cancer Father    Breast cancer Paternal Aunt 69   Cancer Maternal Grandfather        prostate     Current Outpatient Medications:    amoxicillin-clavulanate (AUGMENTIN) 875-125 MG tablet, Take 1 tablet by mouth 2 (two) times daily., Disp: 10 tablet, Rfl: 0   dexamethasone (DECADRON) 4 MG  tablet, Take 2 tablets (8 mg total) by mouth daily. Take daily for 3 days after chemo. Take with food., Disp: 30 tablet, Rfl: 1   lidocaine-prilocaine (EMLA) cream, Apply to affected area once, Disp: 30 g, Rfl: 3   metroNIDAZOLE (METROGEL) 0.75 % gel, Apply 1 application topically daily as needed (rosacea)., Disp: , Rfl:    triamcinolone ointment (KENALOG) 0.5 %, Apply 1 application topically 2 (two) times daily. OVER THE PORT SITE FOR REDNESS, Disp: 30 g, Rfl: 0   acetaminophen (TYLENOL) 500 MG tablet, Take 1,000 mg by mouth every 6 (six) hours as needed for moderate pain. (Patient not taking: Reported on 04/21/2021), Disp: , Rfl:    LORazepam (ATIVAN) 0.5 MG tablet, Take 1 tablet (0.5 mg total) by mouth every 6 (six) hours as needed (Nausea or vomiting). (Patient not taking: Reported on 04/07/2021), Disp: 30 tablet, Rfl: 0   ondansetron (ZOFRAN) 8 MG tablet, Take 1 tablet (8 mg total) by mouth 2 (two) times daily as needed. Start on the third  day after chemotherapy. (Patient not taking: Reported on 04/07/2021), Disp: 30 tablet, Rfl: 1   prochlorperazine (COMPAZINE) 10 MG tablet, Take 1 tablet (10 mg total) by mouth every 6 (six) hours as needed (Nausea or vomiting). (Patient not taking: Reported on 04/07/2021), Disp: 30 tablet, Rfl: 1  Physical exam:  Vitals:   04/21/21 1043  BP: 102/84  Pulse: 77  Resp: 16  Temp: 98.2 F (36.8 C)  TempSrc: Tympanic  SpO2: 100%  Weight: 181 lb (82.1 kg)   Physical Exam Constitutional:      General: She is not in acute distress. Cardiovascular:     Rate and Rhythm: Normal rate and regular rhythm.     Heart sounds: Normal heart sounds.  Pulmonary:     Effort: Pulmonary effort is normal.     Breath sounds: Normal breath sounds.  Abdominal:     General: Bowel sounds are normal.     Palpations: Abdomen is soft.  Skin:    General: Skin is warm and dry.  Neurological:     Mental Status: She is alert and oriented to person, place, and time.  Chest  wall: Erythematous nodular rash noted over port site which appears to be more consistent with irritant dermatitis rather than true infection.  CMP Latest Ref Rng & Units 04/21/2021  Glucose 70 - 99 mg/dL 98  BUN 6 - 20 mg/dL 18  Creatinine 0.44 - 1.00 mg/dL 0.91  Sodium 135 - 145 mmol/L 139  Potassium 3.5 - 5.1 mmol/L 3.8  Chloride 98 - 111 mmol/L 105  CO2 22 - 32 mmol/L 29  Calcium 8.9 - 10.3 mg/dL 8.9  Total Protein 6.5 - 8.1 g/dL 7.0  Total Bilirubin 0.3 - 1.2 mg/dL 0.2(L)  Alkaline Phos 38 - 126 U/L 78  AST 15 - 41 U/L 18  ALT 0 - 44 U/L 21   CBC Latest Ref Rng & Units 04/21/2021  WBC 4.0 - 10.5 K/uL 8.0  Hemoglobin 12.0 - 15.0 g/dL 11.4(L)  Hematocrit 36.0 - 46.0 % 36.0  Platelets 150 - 400 K/uL 281      NM Bone Scan Whole Body  Result Date: 04/09/2021 CLINICAL DATA:  Breast cancer, staging. EXAM: NUCLEAR MEDICINE WHOLE BODY BONE SCAN TECHNIQUE: Whole body anterior and posterior images were obtained approximately 3 hours after intravenous injection of radiopharmaceutical. RADIOPHARMACEUTICALS:  21.08 mCi Technetium-63m MDP IV COMPARISON:  CT April 01, 2021 FINDINGS: No convincing scintigraphic evidence of abnormal radiotracer uptake to suggest osteoblastic metastatic disease. Focus of abnormal radiotracer activity along the left lateral aspect of the lower sternum as well as along the sternomanubrial joint which appears to reflect productive degenerative change on CT April 01, 2021. Multifocal radiotracer uptake involving the shoulders, sternoclavicular joints, knees, ankles and feet in a pattern most consistent with degenerative arthropathy. Otherwise physiologic distribution of radiotracer activity. IMPRESSION: No convincing scintigraphic evidence of osteoblastic osseous metastatic disease. Foci of abnormal radiotracer uptake along the left lateral aspect of the lower sternum as well as along the sternomanubrial joint which corresponds with productive degenerative change on CT  April 01, 2021. Electronically Signed   By: Dahlia Bailiff M.D.   On: 04/09/2021 10:34   CT CHEST ABDOMEN PELVIS W CONTRAST  Result Date: 04/01/2021 CLINICAL DATA:  Breast cancer.  Staging. EXAM: CT CHEST, ABDOMEN, AND PELVIS WITH CONTRAST TECHNIQUE: Multidetector CT imaging of the chest, abdomen and pelvis was performed following the standard protocol during bolus administration of intravenous contrast. CONTRAST:  129mL OMNIPAQUE IOHEXOL 300 MG/ML  SOLN  COMPARISON:  None FINDINGS: CT CHEST FINDINGS Cardiovascular: The heart size is normal. No substantial pericardial effusion. No thoracic aortic aneurysm. Mediastinum/Nodes: No mediastinal lymphadenopathy. There is no hilar lymphadenopathy. The esophagus has normal imaging features. There is no left axillary lymphadenopathy. Mild lymphadenopathy noted in the right axilla including 11 mm short axis node on 24/2 and 11 mm short axis node on 25/2. Lungs/Pleura: No suspicious pulmonary nodule or mass. 5 mm left lower lobe nodule on 99/3 is likely benign. No focal airspace consolidation. No pleural effusion. Musculoskeletal: No worrisome lytic or sclerotic osseous abnormality. CT ABDOMEN PELVIS FINDINGS Hepatobiliary: No suspicious focal abnormality within the liver parenchyma. Calcified gallstones identified measuring up to 13 mm in the neck of the gallbladder. No intrahepatic or extrahepatic biliary dilation. Pancreas: No focal mass lesion. No dilatation of the main duct. No intraparenchymal cyst. No peripancreatic edema. Spleen: No splenomegaly. No focal mass lesion. Adrenals/Urinary Tract: No adrenal nodule or mass. Kidneys unremarkable. No evidence for hydroureter. The urinary bladder appears normal for the degree of distention. Stomach/Bowel: Stomach is unremarkable. No gastric wall thickening. No evidence of outlet obstruction. Duodenum is normally positioned as is the ligament of Treitz. No small bowel wall thickening. No small bowel dilatation. The terminal  ileum is normal. The appendix is not well visualized, but there is no edema or inflammation in the region of the cecum. No gross colonic mass. No colonic wall thickening. Diverticular changes are noted in the left colon without evidence of diverticulitis. Vascular/Lymphatic: No abdominal aortic aneurysm. No abdominal aortic atherosclerotic calcification. There is no gastrohepatic or hepatoduodenal ligament lymphadenopathy. No retroperitoneal or mesenteric lymphadenopathy. No pelvic sidewall lymphadenopathy. Reproductive: Unremarkable. Other: No intraperitoneal free fluid. Musculoskeletal: No worrisome lytic or sclerotic osseous abnormality. IMPRESSION: 1. Mild right axillary lymphadenopathy, compatible with the patient's known metastatic disease. No other evidence for metastatic disease in the chest, abdomen, or pelvis. 2. 5 mm left lower lobe pulmonary nodule, likely benign. Attention on follow-up recommended. 3. Cholelithiasis. 4. Left colonic diverticulosis without diverticulitis. Electronically Signed   By: Misty Stanley M.D.   On: 04/01/2021 15:58   MR BREAST BILATERAL W WO CONTRAST INC CAD  Result Date: 03/27/2021 CLINICAL DATA:  Recently biopsied extensive calcifications throughout the upper-outer quadrant of the right breast with pathology results of invasive mammary carcinoma and high-grade DCIS. The patient also had a right axillary lymph node biopsy demonstrating metastatic mammary carcinoma. LABS:  None obtained on site today. EXAM: BILATERAL BREAST MRI WITH AND WITHOUT CONTRAST TECHNIQUE: Multiplanar, multisequence MR images of both breasts were obtained prior to and following the intravenous administration of 8 ml of Gadavist Three-dimensional MR images were rendered by post-processing of the original MR data on an independent workstation. The three-dimensional MR images were interpreted, and findings are reported in the following complete MRI report for this study. Three dimensional images were  evaluated at the independent interpreting workstation using the DynaCAD thin client. COMPARISON:  Recent mammogram, ultrasound and biopsy examinations. FINDINGS: Breast composition: c. Heterogeneous fibroglandular tissue. Background parenchymal enhancement: Minimal Right breast: Extensive non mass enhancement extending from the nipple to the posterior aspects of the breast involving the upper outer, lower outer and upper inner quadrants. This spans an area measuring 10.9 x 8.4 x 6.8 cm and corresponds to the recently biopsied invasive mammary carcinoma and DCIS. This has a mixture of enhancement kinetics, including rapid wash-in/washout. Left breast: Rounded similar area non mass enhancement in the slightly lower outer quadrant, spanning 1.6 x 1.3 x 0.8 cm. This has  enhancement kinetics similar to the biopsy-proven malignancy on the right. Lymph nodes: 6 right axillary lymph nodes with abnormal cortical thickening, including the recently biopsied lymph. No abnormal axillary lymph nodes on the left and no abnormal intramammary lymph nodes on either side. Ancillary findings:  None. IMPRESSION: 1. 10.9 x 8 4 x 6.8 cm area of biopsy-proven malignancy the extending from the anterior to the posterior aspects of the right breast, involving 3 quadrants. 2. 1.6 x 1.3 x 0.8 cm similar area in the slightly lower outer quadrant of the left breast, suspicious for malignancy. 3. 6 metastatic right axillary lymph nodes. RECOMMENDATION: MR guided core needle biopsy of the 1.6 cm area of non mass enhancement in the lower outer quadrant of the left breast. BI-RADS CATEGORY  4: Suspicious. Electronically Signed   By: Claudie Revering M.D.   On: 03/27/2021 16:27  DG Chest Port 1 View  Result Date: 04/02/2021 CLINICAL DATA:  Postop evaluation following Port-A-Cath placement. EXAM: PORTABLE CHEST 1 VIEW COMPARISON:  Chest abdomen pelvis CT of 04/01/2021. FINDINGS: LEFT-sided Port-A-Cath has been place since previous imaging via LEFT  internal jugular approach. Tip at the caval to atrial junction. Cardiomediastinal contours and hilar structures are normal. EKG leads project over the chest. No sign of pneumothorax. No sign of consolidation or sign of pleural effusion. On limited assessment there is no acute skeletal process. IMPRESSION: LEFT-sided Port-A-Cath in good position. No pneumothorax. No acute cardiopulmonary disease. Electronically Signed   By: Zetta Bills M.D.   On: 04/02/2021 16:57   DG C-Arm 1-60 Min-No Report  Result Date: 04/02/2021 Fluoroscopy was utilized by the requesting physician.  No radiographic interpretation.   ECHOCARDIOGRAM COMPLETE  Result Date: 03/31/2021    ECHOCARDIOGRAM REPORT   Patient Name:   WHITTLEY CARANDANG Date of Exam: 03/31/2021 Medical Rec #:  [id number]       Height:       66.0 in Accession #:    [id number]      Weight:       186.3 lb Date of Birth:  10/31/1965       BSA:          1.940 m Patient Age:    55 years        BP:           115/76 mmHg Patient Gender: F               HR:           71 bpm. Exam Location:  [facility] Procedure: 2D Echo, Cardiac Doppler, Color Doppler and Strain Analysis Indications:     Chemo Z09  History:         Patient has no prior history of Echocardiogram examinations.                  Breast cancer, mild renal insufficiency.  Sonographer:     Sherrie Sport Referring Phys:  [id number] [facility] C Lititia Sen Diagnosing Phys: Kate Sable MD  Sonographer Comments: Global longitudinal strain was attempted. IMPRESSIONS  1. Left ventricular ejection fraction, by estimation, is 60 to 65%. The left ventricle has normal function. The left ventricle has no regional wall motion abnormalities. There is mild left ventricular hypertrophy. Left ventricular diastolic parameters are consistent with Grade I diastolic dysfunction (impaired relaxation).  2. Right ventricular systolic function is normal. The right ventricular size is normal.  3. The mitral valve is normal in structure. No evidence of  mitral valve regurgitation.  4. The aortic  valve is tricuspid. Aortic valve regurgitation is not visualized.  5. The inferior vena cava is normal in size with greater than 50% respiratory variability, suggesting right atrial pressure of 3 mmHg. FINDINGS  Left Ventricle: Left ventricular ejection fraction, by estimation, is 60 to 65%. The left ventricle has normal function. The left ventricle has no regional wall motion abnormalities. Global longitudinal strain performed but not reported based on interpreter judgement due to suboptimal tracking. 3D left ventricular ejection fraction analysis performed but not reported based on interpreter judgement due to suboptimal quality. The left ventricular internal cavity size was normal in size. There is mild left ventricular hypertrophy. Left ventricular diastolic parameters are consistent with Grade I diastolic dysfunction (impaired relaxation). Right Ventricle: The right ventricular size is normal. No increase in right ventricular wall thickness. Right ventricular systolic function is normal. Left Atrium: Left atrial size was normal in size. Right Atrium: Right atrial size was normal in size. Pericardium: There is no evidence of pericardial effusion. Mitral Valve: The mitral valve is normal in structure. No evidence of mitral valve regurgitation. MV peak gradient, 2.2 mmHg. The mean mitral valve gradient is 1.0 mmHg. Tricuspid Valve: The tricuspid valve is normal in structure. Tricuspid valve regurgitation is not demonstrated. Aortic Valve: The aortic valve is tricuspid. Aortic valve regurgitation is not visualized. Aortic valve mean gradient measures 2.5 mmHg. Aortic valve peak gradient measures 5.3 mmHg. Aortic valve area, by VTI measures 2.34 cm. Pulmonic Valve: The pulmonic valve was normal in structure. Pulmonic valve regurgitation is not visualized. Aorta: The aortic root is normal in size and structure. Venous: The inferior vena cava is normal in size with greater  than 50% respiratory variability, suggesting right atrial pressure of 3 mmHg. IAS/Shunts: No atrial level shunt detected by color flow Doppler.  LEFT VENTRICLE PLAX 2D LVIDd:         4.17 cm   Diastology LVIDs:         2.45 cm   LV e' medial:    7.62 cm/s LV PW:         1.16 cm   LV E/e' medial:  7.5 LV IVS:        0.91 cm   LV e' lateral:   11.30 cm/s LVOT diam:     2.00 cm   LV E/e' lateral: 5.1 LV SV:         51 LV SV Index:   26 LVOT Area:     3.14 cm                           3D Volume EF:                          3D EF:        55 %                          LV EDV:       111 ml                          LV ESV:       50 ml                          LV SV:        61 ml RIGHT VENTRICLE RV Basal diam:  3.19 cm RV S prime:     12.70 cm/s TAPSE (M-mode): 4.0 cm LEFT ATRIUM             Index        RIGHT ATRIUM           Index LA diam:        3.00 cm 1.55 cm/m   RA Area:     12.50 cm LA Vol (A2C):   25.1 ml 12.94 ml/m  RA Volume:   29.70 ml  15.31 ml/m LA Vol (A4C):   26.8 ml 13.81 ml/m LA Biplane Vol: 28.6 ml 14.74 ml/m  AORTIC VALVE                    PULMONIC VALVE AV Area (Vmax):    2.09 cm     PV Vmax:        0.53 m/s AV Area (Vmean):   2.11 cm     PV Vmean:       37.450 cm/s AV Area (VTI):     2.34 cm     PV VTI:         0.102 m AV Vmax:           115.00 cm/s  PV Peak grad:   1.1 mmHg AV Vmean:          76.700 cm/s  PV Mean grad:   1.0 mmHg AV VTI:            0.217 m      RVOT Peak grad: 3 mmHg AV Peak Grad:      5.3 mmHg AV Mean Grad:      2.5 mmHg LVOT Vmax:         76.40 cm/s LVOT Vmean:        51.600 cm/s LVOT VTI:          0.161 m LVOT/AV VTI ratio: 0.74  AORTA Ao Root diam: 2.90 cm MITRAL VALVE               TRICUSPID VALVE MV Area (PHT): 3.60 cm    TR Peak grad:   18.8 mmHg MV Area VTI:   2.70 cm    TR Vmax:        217.00 cm/s MV Peak grad:  2.2 mmHg MV Mean grad:  1.0 mmHg    SHUNTS MV Vmax:       0.75 m/s    Systemic VTI:  0.16 m MV Vmean:      49.5 cm/s   Systemic Diam: 2.00 cm MV Decel  Time: 211 msec    Pulmonic VTI:  0.168 m MV E velocity: 57.40 cm/s MV A velocity: 68.10 cm/s MV E/A ratio:  0.84 Kate Sable MD Electronically signed by Kate Sable MD Signature Date/Time: 03/31/2021/1:07:06 PM    Final      Assessment and plan- Patient is a 55 y.o. female with newly diagnosed clinical prognostic stage IIIa invasive mammary carcinoma of the right breast cT3 N1 M0 ER/PR positive HER2 negative.  She is here for on treatment assessment prior to cycle 2 of neoadjuvant dose dense AC chemotherapy  Counts okay to proceed with cycle 2 of neoadjuvant dose dense AC chemotherapy on day 1.  She will be receiving Udenyca at home with the cycle.  She will be seen by covering NP in 2 weeks and 4 weeks for cycle 3 and cycle 4 of neoadjuvant dose dense AC chemotherapy.  Plan to get interim ultrasound to assess  response to treatment after 4 cycles.  If ultrasound does not give [place] a good idea may need to get an MRI.  I will see her in 6 weeks for cycle 1 of Taxol chemotherapy.  Her port site does not appear infected at this time and findings appear more consistent with irritant dermatitis.  I have asked her to use topical steroids over the port site.  I will cover her empirically with Augmentin and is otherwise okay to access port for chemotherapy today.   Visit Diagnosis 1. Encounter for antineoplastic chemotherapy   2. Dermatitis   3. Breast cancer, stage 1, estrogen receptor positive, right ([place])      Dr. Randa Evens, MD, MPH [facility] at [facility] [phone] 04/21/2021 4:39 PM

## 2021-04-22 ENCOUNTER — Encounter: Payer: Self-pay | Admitting: Oncology

## 2021-04-22 ENCOUNTER — Inpatient Hospital Stay: Payer: Managed Care, Other (non HMO)

## 2021-04-22 ENCOUNTER — Encounter: Payer: Self-pay | Admitting: *Deleted

## 2021-04-22 ENCOUNTER — Ambulatory Visit: Admission: RE | Admit: 2021-04-22 | Payer: Managed Care, Other (non HMO) | Source: Ambulatory Visit

## 2021-04-22 ENCOUNTER — Other Ambulatory Visit: Payer: Self-pay | Admitting: Oncology

## 2021-04-22 DIAGNOSIS — R928 Other abnormal and inconclusive findings on diagnostic imaging of breast: Secondary | ICD-10-CM

## 2021-04-23 ENCOUNTER — Telehealth: Payer: Self-pay | Admitting: *Deleted

## 2021-04-23 ENCOUNTER — Other Ambulatory Visit: Payer: Self-pay

## 2021-04-23 ENCOUNTER — Inpatient Hospital Stay (HOSPITAL_BASED_OUTPATIENT_CLINIC_OR_DEPARTMENT_OTHER): Payer: Managed Care, Other (non HMO) | Admitting: Licensed Clinical Social Worker

## 2021-04-23 ENCOUNTER — Inpatient Hospital Stay: Payer: Managed Care, Other (non HMO)

## 2021-04-23 ENCOUNTER — Encounter: Payer: Self-pay | Admitting: Licensed Clinical Social Worker

## 2021-04-23 DIAGNOSIS — C50911 Malignant neoplasm of unspecified site of right female breast: Secondary | ICD-10-CM

## 2021-04-23 DIAGNOSIS — Z800 Family history of malignant neoplasm of digestive organs: Secondary | ICD-10-CM | POA: Diagnosis not present

## 2021-04-23 DIAGNOSIS — Z803 Family history of malignant neoplasm of breast: Secondary | ICD-10-CM | POA: Diagnosis not present

## 2021-04-23 DIAGNOSIS — Z170 Estrogen receptor positive status [ER+]: Secondary | ICD-10-CM

## 2021-04-23 DIAGNOSIS — Z8052 Family history of malignant neoplasm of bladder: Secondary | ICD-10-CM | POA: Diagnosis not present

## 2021-04-23 NOTE — Progress Notes (Signed)
REFERRING PROVIDER: Sindy Guadeloupe, MD [place],  [place] [address]  PRIMARY PROVIDER:  Derinda Late, MD  PRIMARY REASON FOR VISIT:  1. Breast cancer, stage 1, estrogen receptor positive, right ([place])   2. Family history of pancreatic cancer   3. Family history of bladder cancer   4. Family history of breast cancer      HISTORY OF PRESENT ILLNESS:   Ms. Philemon, a 55 y.o. female, was seen for a [facility] cancer genetics consultation at the request of Dr. Janese Banks due to a personal and family history of cancer.  Ms. Mcglory presents to clinic today to discuss the possibility of a hereditary predisposition to cancer, genetic testing, and to further clarify her future cancer risks, as well as potential cancer risks for family members.   In 2022, at the age of 2, Ms. Gallaher was diagnosed with invasive mammary carcinoma of the right breast, ER/PR+, HER2-. The treatment plan currently includes neoadjuvant chemotherapy.   CANCER HISTORY:  Oncology History  Breast cancer, stage 1, estrogen receptor positive, right ([place])  03/25/2021 Initial Diagnosis   Breast cancer, stage 1, estrogen receptor positive, right ([place])   03/25/2021 Cancer Staging   Staging form: Breast, AJCC 8th Edition - Clinical stage from 03/25/2021: Stage IIIA (cT3, cN1, cM0, G3, ER+, PR+, HER2-) - Signed by Sindy Guadeloupe, MD on 03/25/2021 Histologic grading system: 3 grade system    04/07/2021 -  Chemotherapy   Patient is on Treatment Plan : BREAST NEOADJUVANT DOSE DENSE AC q14d / PACLitaxel q7d        RISK FACTORS:  Menarche was at age 57.  First live birth at age 35.  OCP use for approximately 10 years.  Ovaries intact: yes.  Hysterectomy: yes.  Menopausal status: postmenopausal.  HRT use: 0 years. Colonoscopy: yes; normal. Mammogram within the last year: yes. Number of breast biopsies: 2. Any excessive radiation exposure in the past: no  Past Medical History:  Diagnosis Date   Anemia     Benign neoplasm of breast 2013   left breast   BRCA negative 2015   Cancer ([place])    Family history of bladder cancer    Family history of breast cancer    Family history of malignant neoplasm of breast 2013   Family history of pancreatic cancer    Screening for obesity     Past Surgical History:  Procedure Laterality Date   ABDOMINAL HYSTERECTOMY  2011   partial   BREAST BIOPSY Left 2013   BREAST BIOPSY Right 03/14/2021   Stereo bx-anterior calcs, "coil" clip-path pending   BREAST BIOPSY Right 03/14/2021   stereo bx-calcs, "Ribbon" clip-path pending   breast biopsy Right 03/14/2021   [place] Bx, Axilla, path pending   BREAST SURGERY Left 10/16/2011   left breast finesse biopsy   COLONOSCOPY WITH PROPOFOL N/A 12/05/2014   Procedure: COLONOSCOPY WITH PROPOFOL;  Surgeon: Christene Lye, MD;  Location: [facility];  Service: Endoscopy;  Laterality: N/A;   DILATION AND CURETTAGE OF UTERUS     PORTACATH PLACEMENT N/A 04/02/2021   Procedure: INSERTION PORT-A-CATH;  Surgeon: Herbert Pun, MD;  Location: [facility] ORS;  Service: General;  Laterality: N/A;    Social History   Socioeconomic History   Marital status: Married    Spouse name: Legrand Como   Number of children: Not on file   Years of education: Not on file   Highest education level: Not on file  Occupational History   Not on file  Tobacco Use   Smoking status: Never   Smokeless tobacco: Never  Substance and Sexual Activity   Alcohol use: No   Drug use: No   Sexual activity: Not on file  Other Topics Concern   Not on file  Social History Narrative   Not on file   Social Determinants of Health   Financial Resource Strain: Not on file  Food Insecurity: Not on file  Transportation Needs: Not on file  Physical Activity: Not on file  Stress: Not on file  Social Connections: Not on file     FAMILY HISTORY:  We obtained a detailed, 4-generation family history.  Significant diagnoses are listed  below: Family History  Problem Relation Age of Onset   Breast cancer Mother 65   Bladder Cancer Father 28   Breast cancer Paternal Aunt 57   Pancreatic cancer Paternal Uncle 22   Pancreatic cancer Maternal Grandfather 19   Cancer Cousin        in ear   Ms. Murphey has 1 son, 74, and 1 daughter, 91. She has 1 sister, 55, and 1 brother, 55, no cancers.  Ms. Marty Heck mother had breast cancer at 25, treated with lumpectomy and radiation, and is living at 12. Patient had 3 maternal uncles, 6 maternal aunts, no cancers. A maternal cousin had cancer in her ear. Maternal grandmother died at  75. Grandfather had pancreatic cancer at 91 and died at 61.  Ms. Auerbach father had bladder cancer at 62 and became metastatic and died at 55. Patient had 2 paternal uncles, 1 aunt. An uncle had pancreatic cancer at 44 and died at 73. An aunt had breast cancer at 82 and is living in her 3s. Paternal grandmother died at 51, grandfather died at 29.  Ms. Nghiem is unaware of previous family history of genetic testing for hereditary cancer risks. Patient's maternal ancestors are of unknown descent, and paternal ancestors are of unknown descent. There is no reported Ashkenazi Jewish ancestry. There is no known consanguinity.    GENETIC COUNSELING ASSESSMENT: Ms. Price is a 55 y.o. female with a personal and family history of breast cancer which is somewhat suggestive of a hereditary cancer syndrome and predisposition to cancer. We, therefore, discussed and recommended the following at today's visit.   DISCUSSION: We discussed that approximately 10% of breast cancer is hereditary. Most cases of hereditary breast/pancreatic cancer are associated with BRCA1/BRCA2 genes, although there are other genes associated with hereditary breast/pancreatic cancer as well. Cancers and risks are gene specific.  We discussed that testing is beneficial for several reasons including surgical decision-making for breast cancer,  knowing about other cancer risks, identifying potential screening and risk-reduction options that may be appropriate, and to understand if other family members could be at risk for cancer and allow them to undergo genetic testing.   We reviewed the characteristics, features and inheritance patterns of hereditary cancer syndromes. We also discussed genetic testing, including the appropriate family members to test, the process of testing, insurance coverage and turn-around-time for results. We discussed the implications of a negative, positive and/or variant of uncertain significant result. We recommended Ms. Knieriem pursue genetic testing for the [facility] CancerNext-Expanded+RNA gene panel.   The CancerNext-Expanded + RNAinsight gene panel offered by [employer] and includes sequencing and rearrangement analysis for the following 77 genes: IP, ALK, APC*, ATM*, AXIN2, BAP1, BARD1, BLM, BMPR1A, BRCA1*, BRCA2*, BRIP1*, CDC73, CDH1*,CDK4, CDKN1B, CDKN2A, CHEK2*, CTNNA1, DICER1, FANCC, FH, FLCN, GALNT12, KIF1B, LZTR1, MAX, MEN1, MET, MLH1*, MSH2*, MSH3,  MSH6*, MUTYH*, NBN, NF1*, NF2, NTHL1, PALB2*, PHOX2B, PMS2*, POT1, PRKAR1A, PTCH1, PTEN*, RAD51C*, RAD51D*,RB1, RECQL, RET, SDHA, SDHAF2, SDHB, SDHC, SDHD, SMAD4, SMARCA4, SMARCB1, SMARCE1, STK11, SUFU, TMEM127, TP53*,TSC1, TSC2, VHL and XRCC2 (sequencing and deletion/duplication); EGFR, EGLN1, HOXB13, KIT, MITF, PDGFRA, POLD1 and POLE (sequencing only); EPCAM and GREM1 (deletion/duplication only).  Based on Ms. Dom's personal and family history of cancer, she meets medical criteria for genetic testing. Despite that she meets criteria, she may still have an out of pocket cost. We discussed that if her out of pocket cost for testing is over $100, the laboratory will call and confirm whether she wants to proceed with testing.  If the out of pocket cost of testing is less than $100 she will be billed by the genetic testing laboratory.   PLAN: After considering the  risks, benefits, and limitations, Ms. Simek provided informed consent to pursue genetic testing and the blood sample was sent to [facility] for analysis of the CancerNext-Expanded+RNA panel. Results should be available within approximately 2-3 weeks' time, at which point they will be disclosed by telephone to Ms. Hollan, as will any additional recommendations warranted by these results. Ms. Neuner will receive a summary of her genetic counseling visit and a copy of her results once available. This information will also be available in Epic.   Ms. Bora questions were answered to her satisfaction today. Our contact information was provided should additional questions or concerns arise. Thank you for the referral and allowing [place] to share in the care of your patient.   Faith Rogue, MS, [facility] Genetic Counselor [email or web address] Phone: [phone]  The patient was seen for a total of 25 minutes in face-to-face genetic counseling.  Patient was seen alone. Dr. Grayland Ormond was available for discussion regarding this case.   _______________________________________________________________________ For Office Staff:  Number of people involved in session: 1 Was an Intern/ student involved with case: no

## 2021-04-23 NOTE — Telephone Encounter (Signed)
She called to let me know that  everything has been set up with injections sent to her house and that nurse has been set up to go to her home and give inj.

## 2021-04-24 ENCOUNTER — Ambulatory Visit: Payer: Managed Care, Other (non HMO) | Admitting: Dermatology

## 2021-04-24 ENCOUNTER — Other Ambulatory Visit: Payer: Managed Care, Other (non HMO)

## 2021-04-24 DIAGNOSIS — D229 Melanocytic nevi, unspecified: Secondary | ICD-10-CM

## 2021-04-24 DIAGNOSIS — L719 Rosacea, unspecified: Secondary | ICD-10-CM

## 2021-04-24 DIAGNOSIS — L578 Other skin changes due to chronic exposure to nonionizing radiation: Secondary | ICD-10-CM

## 2021-04-24 DIAGNOSIS — L249 Irritant contact dermatitis, unspecified cause: Secondary | ICD-10-CM | POA: Diagnosis not present

## 2021-04-24 DIAGNOSIS — L814 Other melanin hyperpigmentation: Secondary | ICD-10-CM

## 2021-04-24 DIAGNOSIS — D1801 Hemangioma of skin and subcutaneous tissue: Secondary | ICD-10-CM

## 2021-04-24 DIAGNOSIS — Z1283 Encounter for screening for malignant neoplasm of skin: Secondary | ICD-10-CM | POA: Diagnosis not present

## 2021-04-24 DIAGNOSIS — L821 Other seborrheic keratosis: Secondary | ICD-10-CM

## 2021-04-24 NOTE — Patient Instructions (Addendum)
Instructions for [facility] Medications  One or more of your medications was sent to the [facility] mail order compounding pharmacy. You will receive an email from them and can purchase the medicine through that link. It will then be mailed to your home at the address you confirmed. If for any reason you do not receive an email from them, please check your spam folder. If you still do not find the email, please let us know. [facility] phone number is [phone].    If You Need Anything After Your Visit  If you have any questions or concerns for your doctor, please call our main line at [phone] and press option 4 to reach your doctor's medical assistant. If no one answers, please leave a voicemail as directed and we will return your call as soon as possible. Messages left after 4 pm will be answered the following business day.   You may also send [place] a message via [place]. We typically respond to MyChart messages within 1-2 business days.  For prescription refills, please ask your pharmacy to contact our office. Our fax number is [phone].  If you have an urgent issue when the clinic is closed that cannot wait until the next business day, you can page your doctor at the number below.    Please note that while we do our best to be available for urgent issues outside of office hours, we are not available 24/7.   If you have an urgent issue and are unable to reach [place], you may choose to seek medical care at your doctor's office, retail clinic, urgent care center, or emergency room.  If you have a medical emergency, please immediately call 911 or go to the emergency department.  Pager Numbers  - Dr. Nehemiah Massed: [phone]  - Dr. Laurence Ferrari: [phone]  - Dr. Nicole Kindred: [phone]  In the event of inclement weather, please call our main line at [phone] for an update on the status of any delays or closures.  Dermatology Medication Tips: Please keep the boxes that  topical medications come in in order to help keep track of the instructions about where and how to use these. Pharmacies typically print the medication instructions only on the boxes and not directly on the medication tubes.   If your medication is too expensive, please contact our office at [phone] option 4 or send [place] a message through [place].   We are unable to tell what your co-pay for medications will be in advance as this is different depending on your insurance coverage. However, we may be able to find a substitute medication at lower cost or fill out paperwork to get insurance to cover a needed medication.   If a prior authorization is required to get your medication covered by your insurance company, please allow [place] 1-2 business days to complete this process.  Drug prices often vary depending on where the prescription is filled and some pharmacies may offer cheaper prices.  The website www.goodrx.com contains coupons for medications through different pharmacies. The prices here do not account for what the cost may be with help from insurance (it may be cheaper with your insurance), but the website can give you the price if you did not use any insurance.  - You can print the associated coupon and take it with your prescription to the pharmacy.  - You may also stop by our office during regular business hours and pick up a GoodRx coupon card.  - If you need your prescription  sent electronically to a different pharmacy, notify our office through [facility] or by phone at [phone] option 4.     Si Usted Necesita Algo Despus de Su Visita  Tambin puede enviarnos un mensaje a travs de Pharmacist, community. Por lo general respondemos a los mensajes de MyChart en el transcurso de 1 a 2 das hbiles.  Para renovar recetas, por favor pida a su farmacia que se ponga en contacto con nuestra oficina. Harland Dingwall de fax es [place] [phone].  Si tiene un asunto urgente cuando la clnica  est cerrada y que no puede esperar hasta el siguiente da hbil, puede llamar/localizar a su doctor(a) al nmero que aparece a continuacin.   Por favor, tenga en cuenta que aunque hacemos todo lo posible para estar disponibles para asuntos urgentes fuera del horario de [place], no estamos disponibles las 24 horas del da, los 7 das de la [place].   Si tiene un problema urgente y no puede comunicarse con nosotros, puede optar por buscar atencin mdica  en el consultorio de su doctor(a), en una clnica privada, en un centro de atencin urgente o en una sala de emergencias.  Si tiene Engineering geologist, por favor llame inmediatamente al 911 o vaya a la sala de emergencias.  Nmeros de bper  - Dr. Nehemiah Massed: [phone]  - Dra. Moye: [phone]  - Dra. Nicole Kindred: [phone]  En caso de inclemencias del [place], por favor llame a Johnsie Kindred principal al [phone] para una actualizacin sobre el [place] de cualquier retraso o cierre.  Consejos para la medicacin en dermatologa: Por favor, guarde las cajas en las que vienen los medicamentos de uso tpico para ayudarle a seguir las instrucciones sobre dnde y cmo usarlos. Las farmacias generalmente imprimen las instrucciones del medicamento slo en las cajas y no directamente en los tubos del [place].   Si su medicamento es muy caro, por favor, pngase en contacto con Zigmund Daniel llamando al [phone] y presione la opcin 4 o envenos un mensaje a travs de Pharmacist, community.   No podemos decirle cul ser su copago por los medicamentos por adelantado ya que esto es diferente dependiendo de la cobertura de su seguro. Sin embargo, es posible que podamos encontrar un medicamento sustituto a Electrical engineer un formulario para que el seguro cubra el medicamento que se considera necesario.   Si se requiere una autorizacin previa para que su compaa de seguros [place] su medicamento, por favor permtanos de 1 a 2 das hbiles para  completar este proceso.  Los precios de los medicamentos varan con frecuencia dependiendo del Environmental consultant de dnde se surte la receta y alguna farmacias pueden ofrecer precios ms baratos.  El sitio web www.goodrx.com tiene cupones para medicamentos de Airline pilot. Los precios aqu no tienen en cuenta lo que podra costar con la ayuda del seguro (puede ser ms barato con su seguro), pero el sitio web puede darle el precio si no utiliz Research scientist (physical sciences).  - Puede imprimir el cupn correspondiente y llevarlo con su receta a la farmacia.  - Tambin puede pasar por nuestra oficina durante el horario de atencin regular y Charity fundraiser una tarjeta de cupones de GoodRx.  - Si necesita que su receta se enve electrnicamente a una farmacia diferente, informe a nuestra oficina a travs de MyChart de [place] o por telfono llamando al [phone] y presione la opcin 4.

## 2021-04-24 NOTE — Progress Notes (Signed)
   Follow-Up Visit   Subjective  Emma Cuevas is a 55 y.o. female who presents for the following: TBSE (Total body exam today. No hx of skin cancer or dysplastic nevi. Pt recently diagnosed with breast cancer and has a port placed and has noticed a rash around the area. States that she was just seen earlier this week by pcp and is treating with amoxicillin and TMC oint. ). The patient presents for Total-Body Skin Exam (TBSE) for skin cancer screening and mole check.The patient has spots, moles and lesions to be evaluated, some may be new or changing and the patient has concerns that these could be cancer.  The following portions of the chart were reviewed this encounter and updated as appropriate:  Tobacco  Allergies  Meds  Problems  Med Hx  Surg Hx  Fam Hx     Review of Systems: No other skin or systemic complaints except as noted in HPI or Assessment and Plan.  Objective  Well appearing patient in no apparent distress; mood and affect are within normal limits.  A full examination was performed including scalp, head, eyes, ears, nose, lips, neck, chest, axillae, abdomen, back, buttocks, bilateral upper extremities, bilateral lower extremities, hands, feet, fingers, toes, fingernails, and toenails. All findings within normal limits unless otherwise noted below.  Head - Anterior (Face) Some pinkness  Left Breast Erythema around port site  Assessment & Plan  Rosacea Head - Anterior (Face) Rx:  [facility] triple rosacea cream PRN  Rosacea is a chronic progressive skin condition usually affecting the face of adults, causing redness and/or acne bumps. It is treatable but not curable. It sometimes affects the eyes (ocular rosacea) as well. It may respond to topical and/or systemic medication and can flare with stress, sun exposure, alcohol, exercise and some foods.  Daily application of broad spectrum spf 30+ sunscreen to face is recommended to reduce flares.  Irritant contact  dermatitis, Left Breast D/c TMC ointment Finish antibiotic as prescribed by PCP  Lentigines - Scattered tan macules - Due to sun exposure - Benign-appearing, observe - Recommend daily broad spectrum sunscreen SPF 30+ to sun-exposed areas, reapply every 2 hours as needed. - Call for any changes  Seborrheic Keratoses - Stuck-on, waxy, tan-brown papules and/or plaques  - Benign-appearing - Discussed benign etiology and prognosis. - Observe - Call for any changes  Melanocytic Nevi - Tan-brown and/or pink-flesh-colored symmetric macules and papules - Benign appearing on exam today - Observation - Call clinic for new or changing moles - Recommend daily use of broad spectrum spf 30+ sunscreen to sun-exposed areas.   Hemangiomas - Red papules - Discussed benign nature - Observe - Call for any changes  Actinic Damage - Chronic condition, secondary to cumulative UV/sun exposure - diffuse scaly erythematous macules with underlying dyspigmentation - Recommend daily broad spectrum sunscreen SPF 30+ to sun-exposed areas, reapply every 2 hours as needed.  - Staying in the shade or wearing long sleeves, sun glasses (UVA+UVB protection) and wide brim hats (4-inch brim around the entire circumference of the hat) are also recommended for sun protection.  - Call for new or changing lesions.  Skin cancer screening performed today.  Return in about 1 year (around 04/24/2022) for TBSE.  IHarriett Sine, CMA, am acting as scribe for Sarina Ser, MD. Documentation: I have reviewed the above documentation for accuracy and completeness, and I agree with the above.  Sarina Ser, MD

## 2021-04-25 ENCOUNTER — Telehealth: Payer: Self-pay | Admitting: *Deleted

## 2021-04-25 NOTE — Telephone Encounter (Signed)
Melissa with [facility] called stating that she emailed some orders to Dr Janese Banks for docusign and has not gotten them back. Did you receive them? Are you doing docusign? Does she need to fax them? Please advise

## 2021-04-25 NOTE — Telephone Encounter (Signed)
done

## 2021-04-30 ENCOUNTER — Other Ambulatory Visit (HOSPITAL_COMMUNITY): Payer: Self-pay | Admitting: Diagnostic Radiology

## 2021-04-30 ENCOUNTER — Other Ambulatory Visit: Payer: Self-pay

## 2021-04-30 ENCOUNTER — Encounter: Payer: Self-pay | Admitting: Oncology

## 2021-04-30 ENCOUNTER — Ambulatory Visit
Admission: RE | Admit: 2021-04-30 | Discharge: 2021-04-30 | Disposition: A | Discharge status: Home / Self Care | Payer: Managed Care, Other (non HMO) | Source: Ambulatory Visit | Attending: Oncology | Admitting: Oncology

## 2021-04-30 DIAGNOSIS — R928 Other abnormal and inconclusive findings on diagnostic imaging of breast: Secondary | ICD-10-CM

## 2021-04-30 HISTORY — PX: BREAST BIOPSY: SHX20

## 2021-04-30 IMAGING — MR MR BREAST BX W LOC DEV 1ST LESION IMAGE BX SPEC MR GUIDE*L*
6 of 8 series · 31 of 48 positions shown · IV contrast (gadavist)
Comparison: Previous exams.
COMPARISON: Previous exams.

Addendum:
CLINICAL DATA: 55-year-old with recent biopsy-proven extensive
grade 3 invasive mammary carcinoma-no special type and high-grade
DCIS involving the RIGHT breast and a biopsy-proven metastatic RIGHT
axillary lymph node. Diagnostic MRI demonstrated non-mass
enhancement in 3 quadrants of the RIGHT breast (upper outer
quadrant, lower outer quadrant, upper inner quadrant) and 6 abnormal
RIGHT axillary lymph nodes. There was a similar 1.6 cm focus of
non-mass enhancement in the outer LEFT breast at middle to posterior
depth which is sampled today.

EXAM:
MRI GUIDED CORE NEEDLE BIOPSY OF THE LEFT BREAST
TECHNIQUE: Multiplanar, multisequence MR imaging of the LEFT breast was
performed both before and after administration of intravenous
contrast.
CONTRAST:  8 mL Gadavist IV.

[Series 2: fiducial unilateral · sagittal · 2.0mm · 1.33mm/px · 1 of 52 slices shown]
[im 1/52]
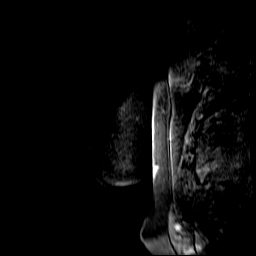

[Series 3: dynamic pre · axial · non-contrast · 1.3mm · 0.73mm/px · z∈[-65,+121]mm · 6 of 144 slices shown]
[im 1/144]
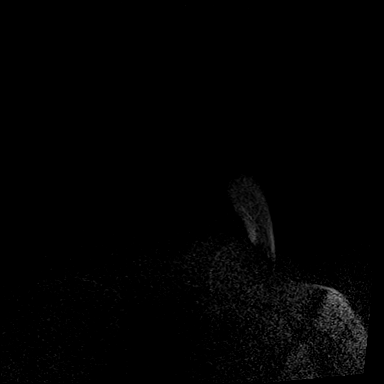
[im 29/144]
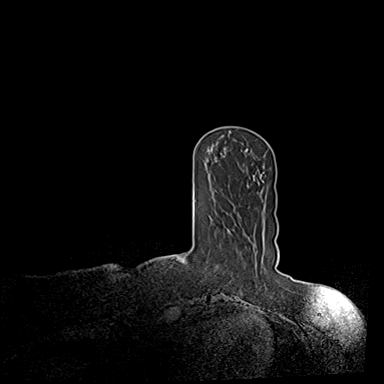
[im 58/144]
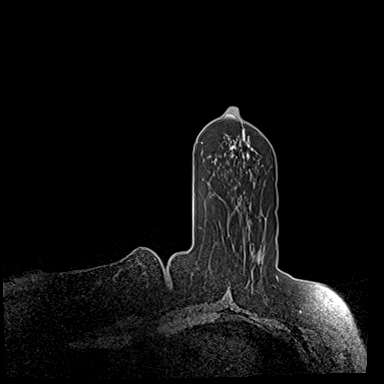
[im 86/144]
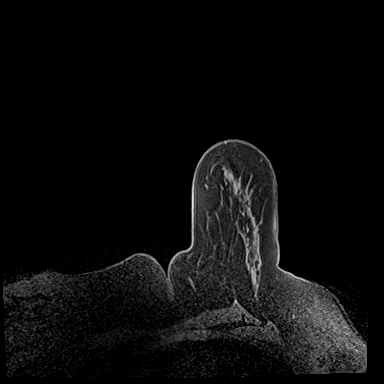
[im 115/144]
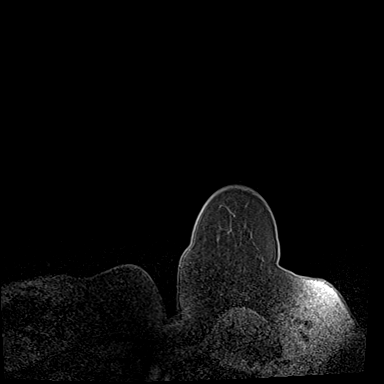
[im 144/144]
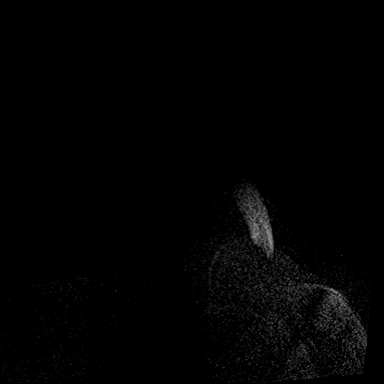

[Series 4: dynamic post 20 · axial · 1.3mm · 0.73mm/px · z∈[-65,+121]mm · 6 of 144 slices shown (1 of 2)]
[im 1/144]
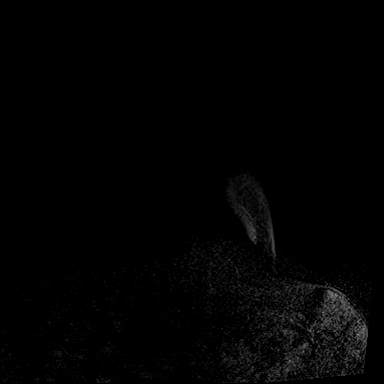
[im 29/144]
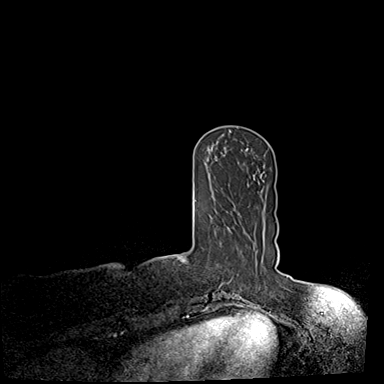
[im 58/144]
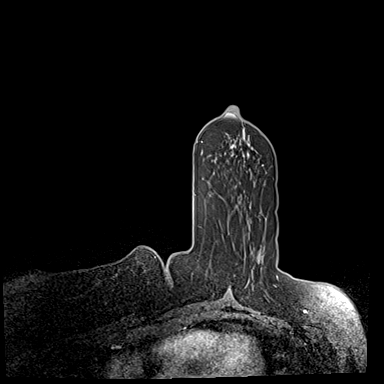
[im 86/144]
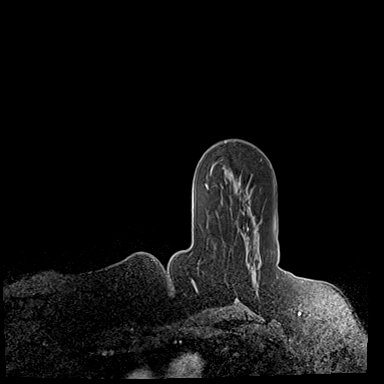
[im 115/144]
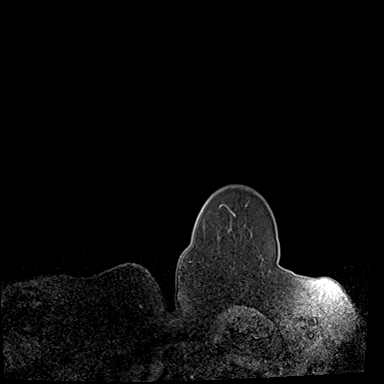
[im 144/144]
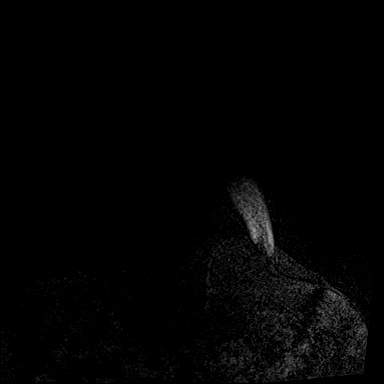

[Series 5: dynamic post 20 · axial · 1.3mm · 0.73mm/px · z∈[-65,+121]mm · 7 of 144 slices shown (2 of 2)]
[im 1/144]
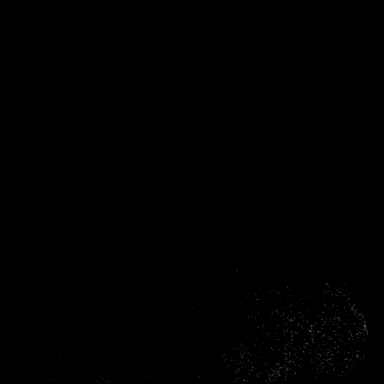
[im 24/144]
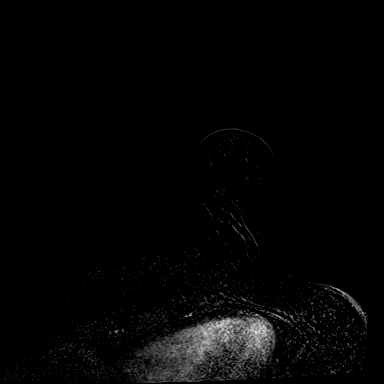
[im 48/144]
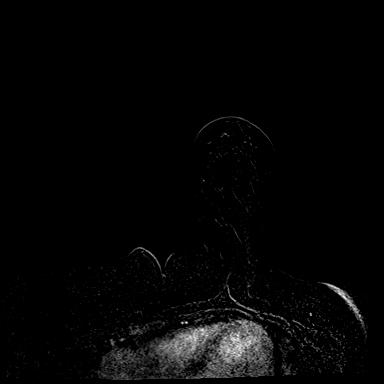
[im 72/144]
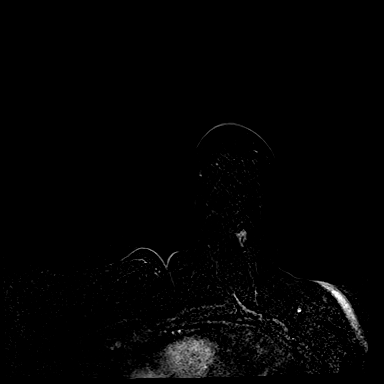
[im 96/144]
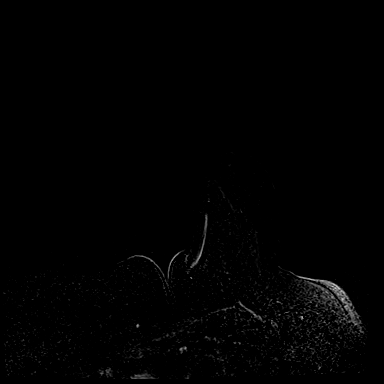
[im 120/144]
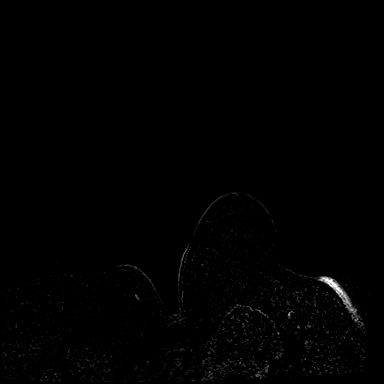
[im 144/144]
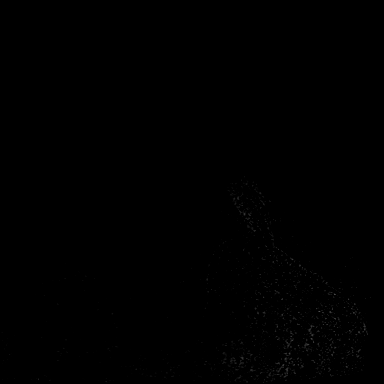

[Series 6: needle confirmation · axial · 1.3mm · 0.73mm/px · z∈[-65,+121]mm · 7 of 144 slices shown]
[im 1/144]
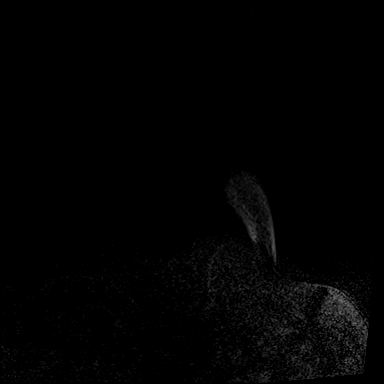
[im 24/144]
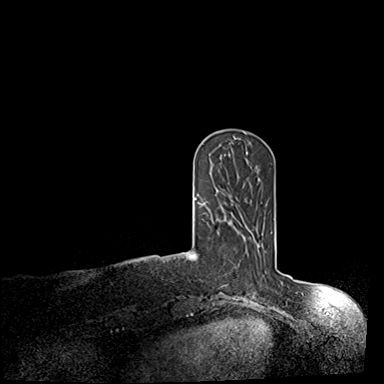
[im 48/144]
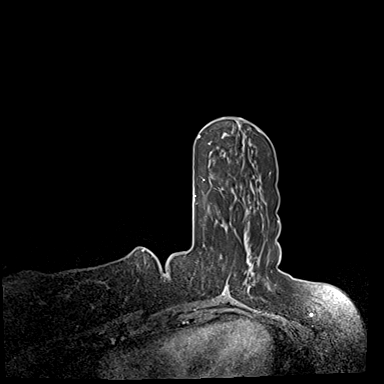
[im 72/144]
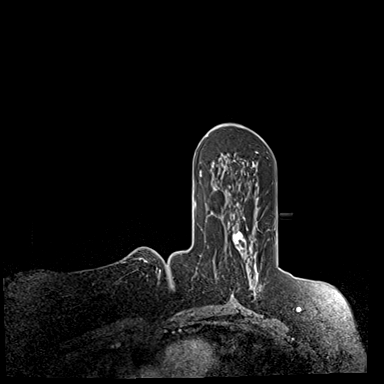
[im 96/144]
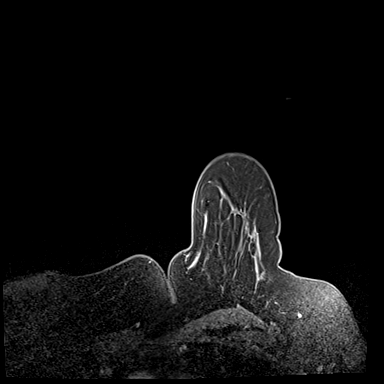
[im 120/144]
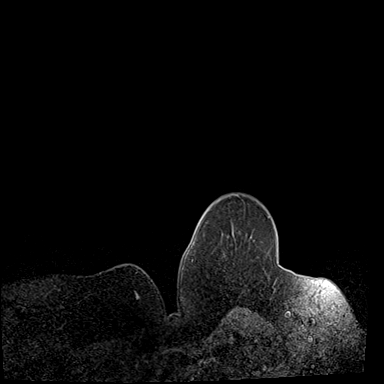
[im 144/144]
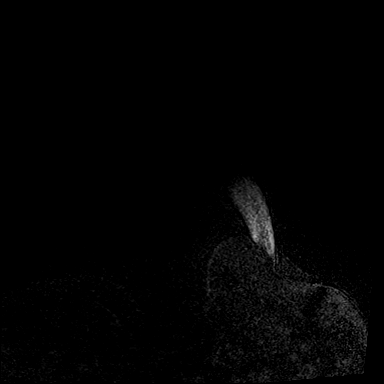

[Series 7: needle confirmation_sub · axial · 1.3mm · 0.73mm/px · z∈[-65,+27]mm · 4 of 144 slices shown]
[im 1/144]
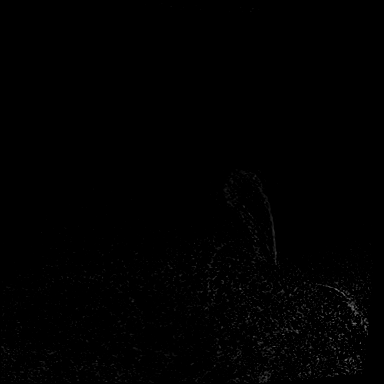
[im 24/144]
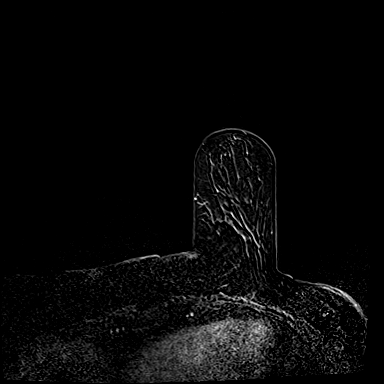
[im 48/144]
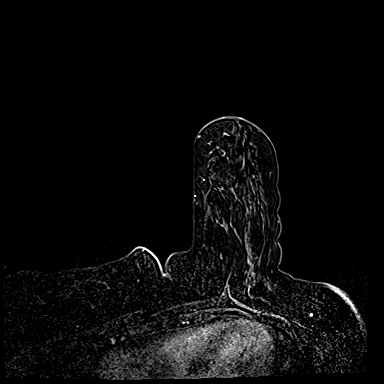
[im 72/144]
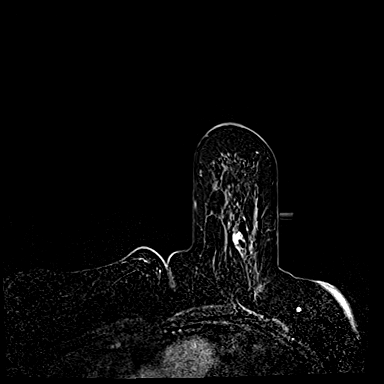

[31 of 48 positions shown; findings below may reference images not displayed]

FINDINGS: I met with the patient, and we discussed the procedure of MRI guided
biopsy, including risks, benefits, and alternatives. Specifically,
we discussed the risks of infection, bleeding, tissue injury, clip
migration, and inadequate sampling. Informed, written consent was
given. The usual time out protocol was performed immediately prior
to the procedure.

Lesion quadrant: LOWER OUTER QUADRANT.

Using sterile technique with chlorhexidine as skin antisepsis, 1%
lidocaine and 1% lidocaine with epinephrine as local anesthetic,
using MRI guidance, a 9 gauge Suros vacuum assisted core needle
device was used perform biopsy of the non-mass enhancement in the
outer LEFT breast using a lateral approach. At the conclusion of the
procedure, a dumbbell shaped tissue marker clip was deployed into
the biopsy cavity. Follow-up 2-view mammogram was performed and
dictated separately.
IMPRESSION: MRI guided biopsy of non-mass enhancement involving the outer LEFT
breast at middle to posterior depth. No apparent complications.

ADDENDUM:
Pathology revealed FIBROCYSTIC CHANGES WITH CALCIFICATIONS of the
LEFT breast, outer, middle depth, (dumbbell clip). This was found to
be concordant by Dr. LICHTENSTEIN.

Pathology results were discussed with the patient by telephone by
LICHTENSTEIN, RN Nurse Navigator. The patient reported doing well
after the biopsy with tenderness at the site. Post biopsy
instructions and care were reviewed and questions were answered. The
patient was encouraged to call The [HOSPITAL] at
[HOSPITAL] for any additional concerns.

The patient has a recent diagnosis of RIGHT breast cancer and should
follow her outlined treatment plan.

Pathology results reported by LICHTENSTEIN, RN on [DATE].

*** End of Addendum ***
FINDINGS: I met with the patient, and we discussed the procedure of MRI guided
biopsy, including risks, benefits, and alternatives. Specifically,
we discussed the risks of infection, bleeding, tissue injury, clip
migration, and inadequate sampling. Informed, written consent was
given. The usual time out protocol was performed immediately prior
to the procedure.

Lesion quadrant: LOWER OUTER QUADRANT.

Using sterile technique with chlorhexidine as skin antisepsis, 1%
lidocaine and 1% lidocaine with epinephrine as local anesthetic,
using MRI guidance, a 9 gauge Suros vacuum assisted core needle
device was used perform biopsy of the non-mass enhancement in the
outer LEFT breast using a lateral approach. At the conclusion of the
procedure, a dumbbell shaped tissue marker clip was deployed into
the biopsy cavity. Follow-up 2-view mammogram was performed and
dictated separately.
IMPRESSION: MRI guided biopsy of non-mass enhancement involving the outer LEFT
breast at middle to posterior depth. No apparent complications.

## 2021-04-30 IMAGING — MG MM BREAST LOCALIZATION CLIP
4 series · 4 of 12 positions shown · non-contrast
Comparison: Previous exam(s).

CLINICAL DATA: Confirmation of clip placement after MRI guided core
needle biopsy an indeterminate 1.6 cm focus of non-mass enhancement
in the outer LEFT breast at middle to posterior depth.

EXAM:
2D and 3D DIAGNOSTIC LEFT MAMMOGRAM POST MRI BIOPSY

[L ML synth-2D]
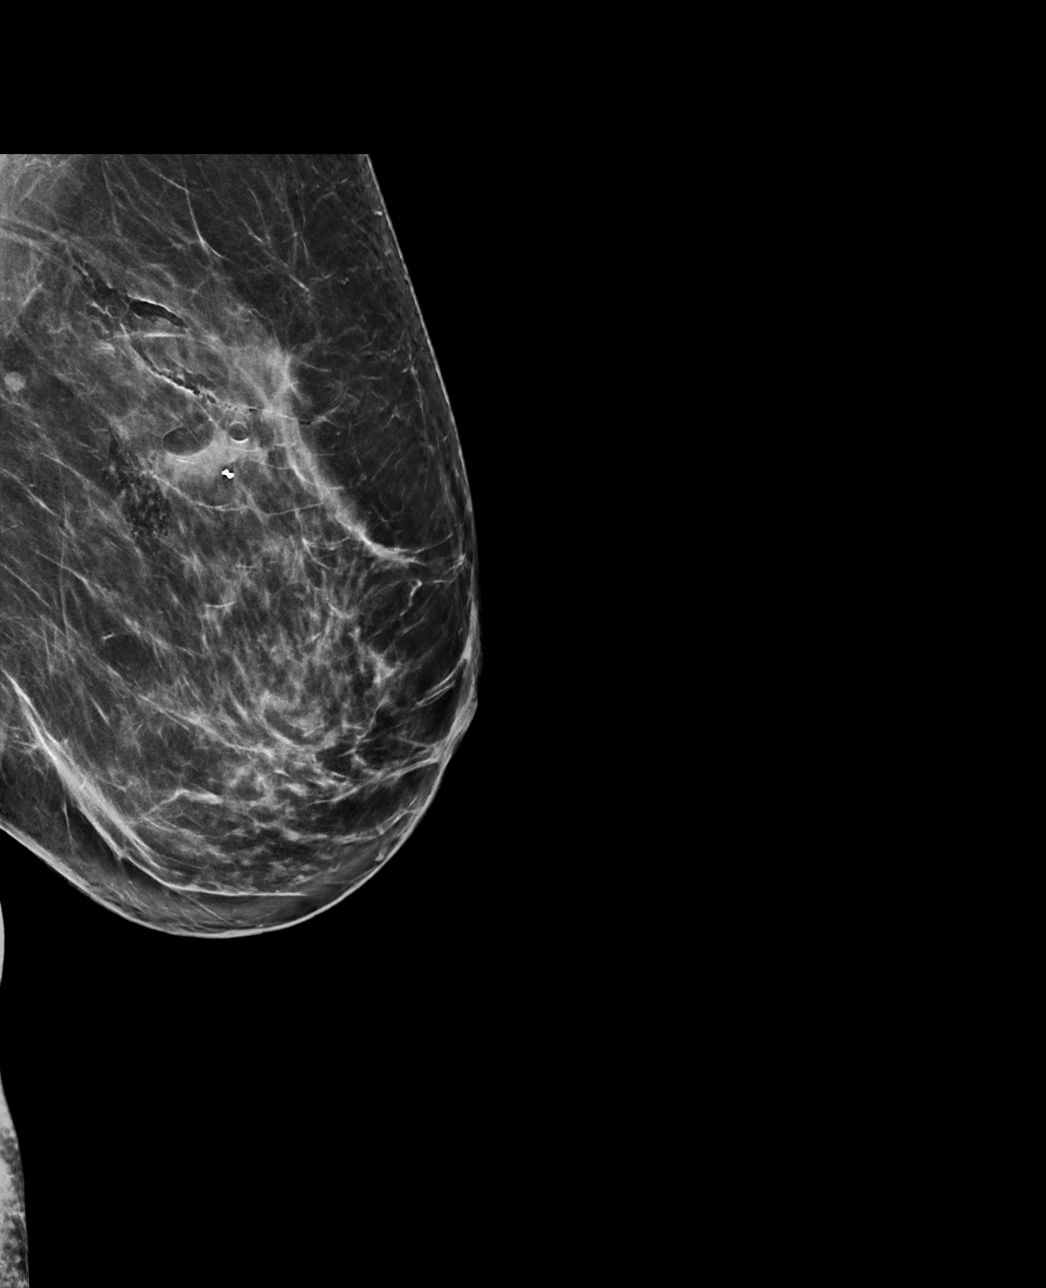

[L CC synth-2D]
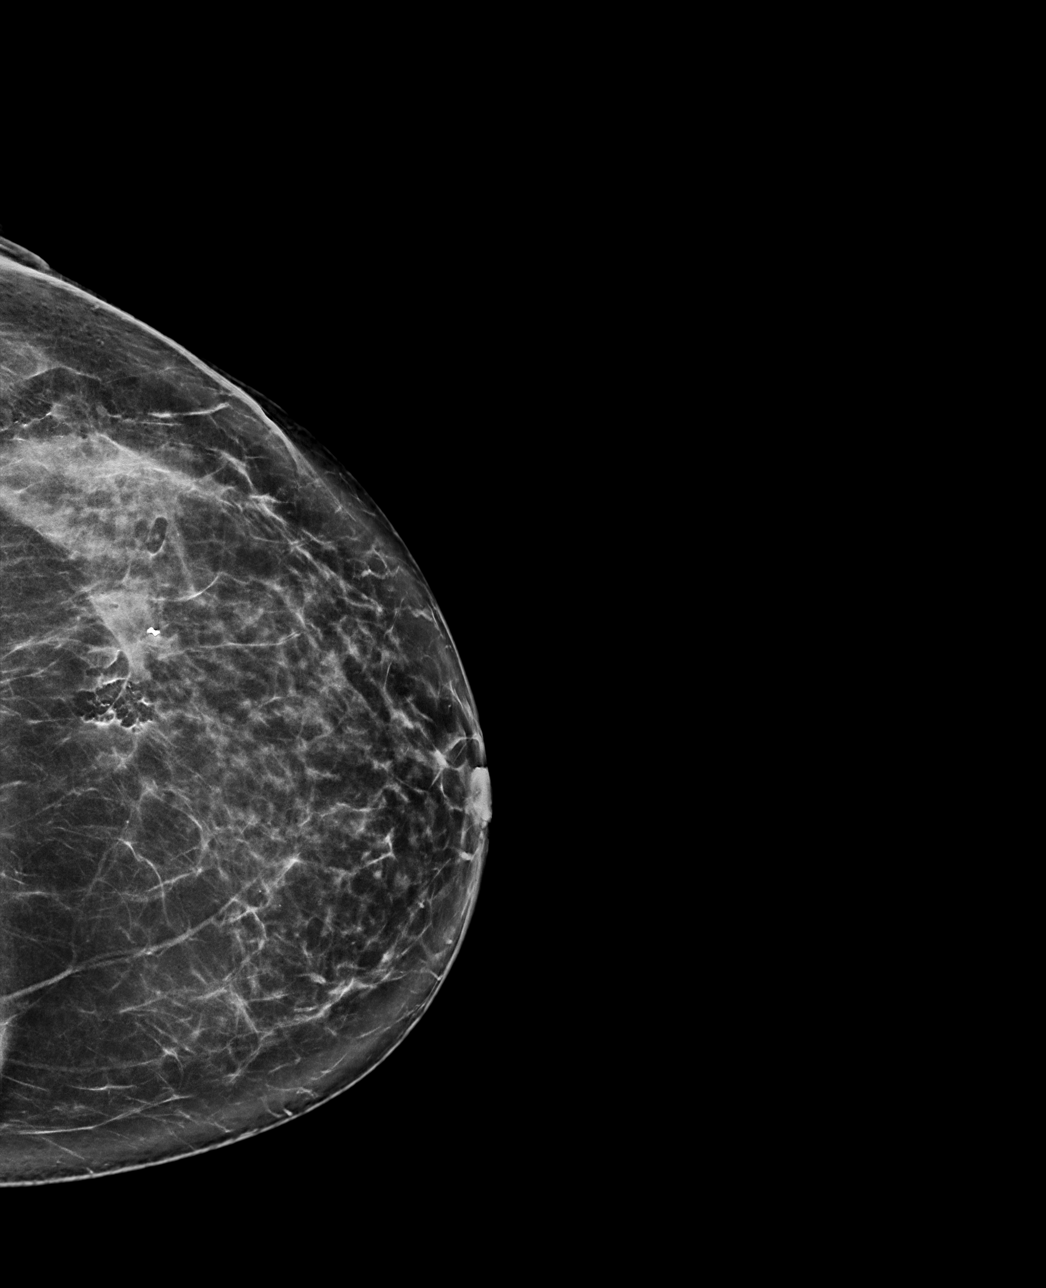

[L CC tomo · tomo slice 43/84.0]
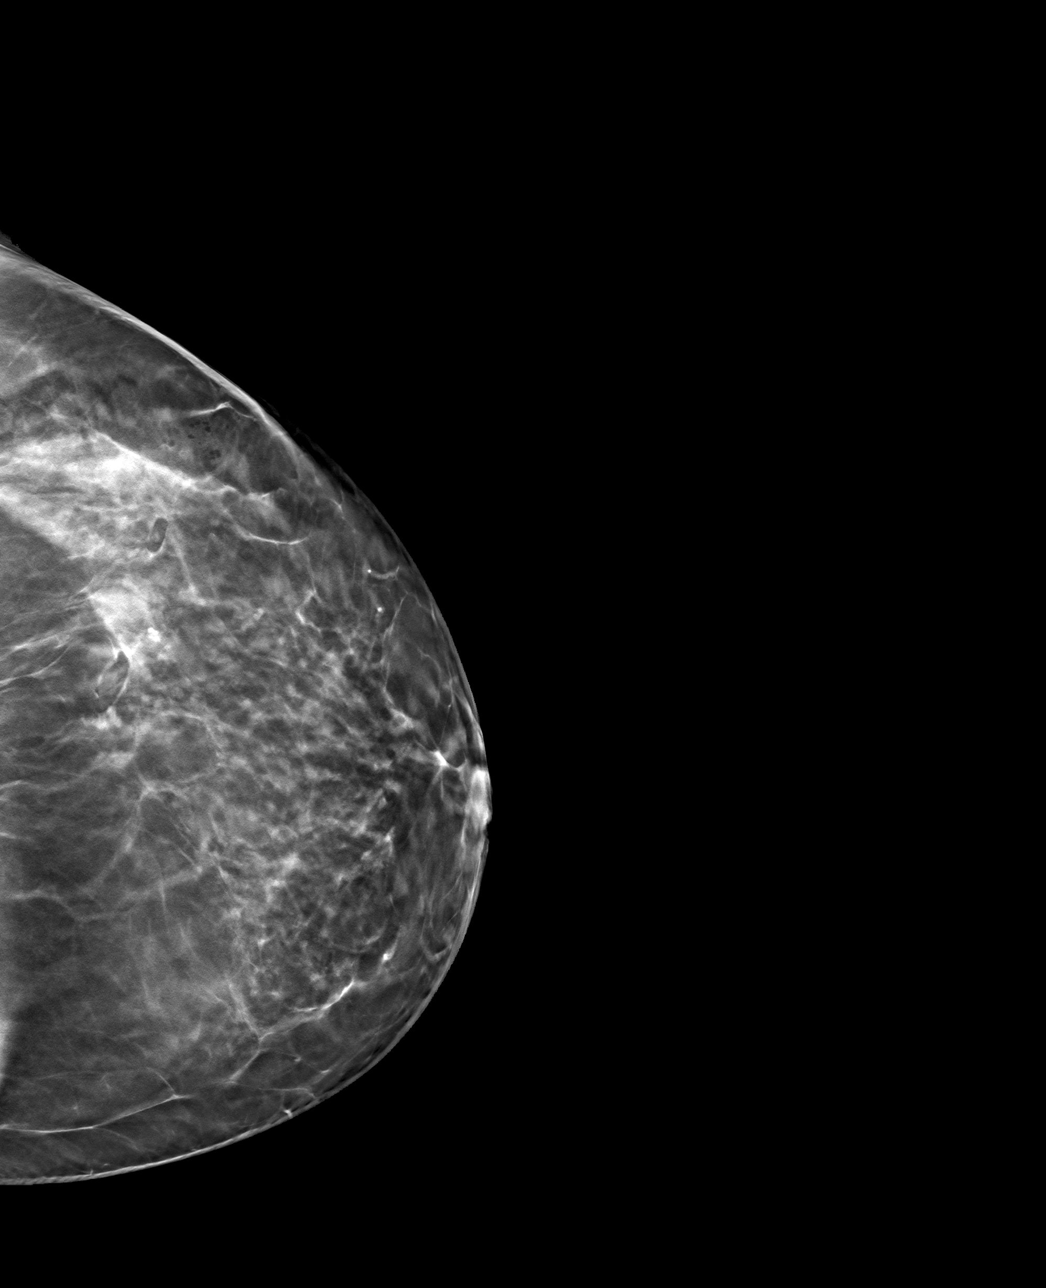

[L ML tomo · tomo slice 45/88.0]
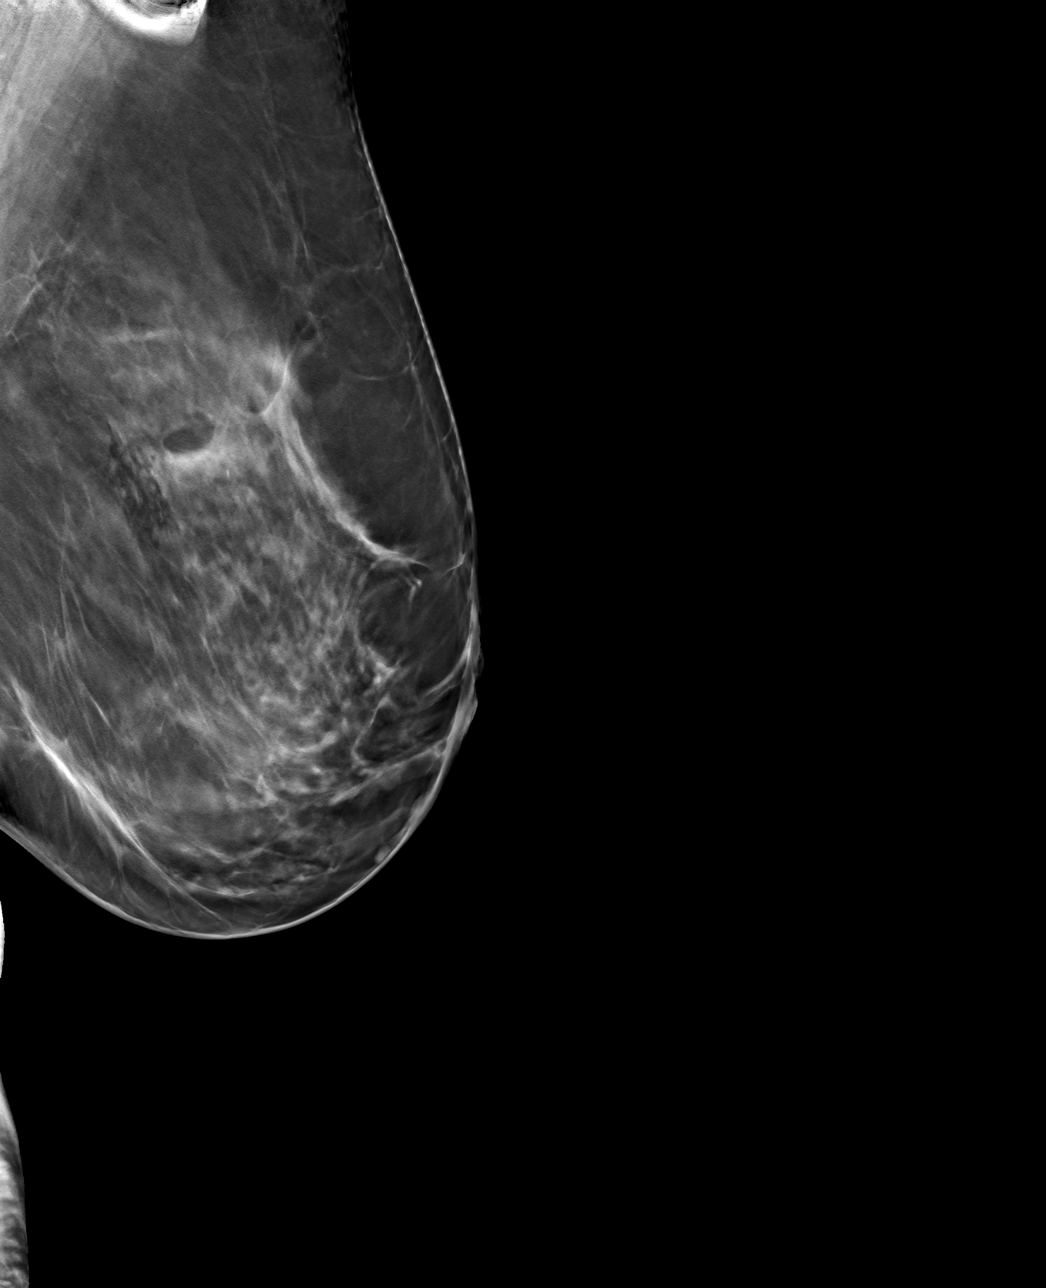

[4 of 12 positions shown; findings below may reference images not displayed]

FINDINGS: 2D and 3D full field CC and mediolateral images were obtained
following MRI guided biopsy of non-mass enhancement in the outer
LEFT breast. The dumbbell shaped tissue marking clip is
appropriately positioned at the anterior margin of the biopsied
enhancement. Interestingly, the S shaped tissue marking clip present
on the screening mammogram [DATE] was removed with the biopsy,
indicating that the enhancement on MRI correlates with the
previously biopsied lesion.

Expected post biopsy changes are present without evidence of
hematoma.
IMPRESSION: 1. Appropriate positioning of the dumbbell shaped biopsy marking
clip at the anterior margin of the biopsied enhancement in the outer
LEFT breast.
2. The S shaped tissue marking clip present on the screening
mammogram [DATE] was removed with the biopsy, indicating that
the enhancement on MRI correlates with the previously biopsied
lesion in the outer LEFT breast.

Final Assessment: Post Procedure Mammograms for Marker Placement

## 2021-04-30 MED ORDER — GADOBUTROL 1 MMOL/ML IV SOLN: 8.0000 mL | Freq: Once | INTRAVENOUS | Status: DC | PRN

## 2021-05-05 ENCOUNTER — Inpatient Hospital Stay: Payer: Managed Care, Other (non HMO)

## 2021-05-05 ENCOUNTER — Inpatient Hospital Stay (HOSPITAL_BASED_OUTPATIENT_CLINIC_OR_DEPARTMENT_OTHER): Payer: Managed Care, Other (non HMO) | Admitting: Nurse Practitioner

## 2021-05-05 ENCOUNTER — Other Ambulatory Visit: Payer: Self-pay

## 2021-05-05 ENCOUNTER — Inpatient Hospital Stay: Payer: Managed Care, Other (non HMO) | Attending: Nurse Practitioner

## 2021-05-05 VITALS — BP 121/61 | HR 77 | Temp 97.5°F | Resp 16 | Wt 182.9 lb

## 2021-05-05 DIAGNOSIS — C50911 Malignant neoplasm of unspecified site of right female breast: Secondary | ICD-10-CM

## 2021-05-05 DIAGNOSIS — Z95828 Presence of other vascular implants and grafts: Secondary | ICD-10-CM | POA: Diagnosis not present

## 2021-05-05 DIAGNOSIS — Z170 Estrogen receptor positive status [ER+]: Secondary | ICD-10-CM

## 2021-05-05 DIAGNOSIS — Z5111 Encounter for antineoplastic chemotherapy: Secondary | ICD-10-CM | POA: Diagnosis not present

## 2021-05-05 LAB — CBC WITH DIFFERENTIAL/PLATELET
Abs Immature Granulocytes: 1.27 10*3/uL — ABNORMAL HIGH (ref 0.00–0.07)
Basophils Absolute: 0 10*3/uL (ref 0.0–0.1)
Basophils Relative: 0 %
Eosinophils Absolute: 0 10*3/uL (ref 0.0–0.5)
Eosinophils Relative: 0 %
HCT: 29.4 % — ABNORMAL LOW (ref 36.0–46.0)
Hemoglobin: 9.5 g/dL — ABNORMAL LOW (ref 12.0–15.0)
Immature Granulocytes: 13 %
Lymphocytes Relative: 12 %
Lymphs Abs: 1.1 10*3/uL (ref 0.7–4.0)
MCH: 27.9 pg (ref 26.0–34.0)
MCHC: 32.3 g/dL (ref 30.0–36.0)
MCV: 86.2 fL (ref 80.0–100.0)
Monocytes Absolute: 0.8 10*3/uL (ref 0.1–1.0)
Monocytes Relative: 8 %
Neutro Abs: 6.3 10*3/uL (ref 1.7–7.7)
Neutrophils Relative %: 67 %
Platelets: 201 10*3/uL (ref 150–400)
RBC: 3.41 MIL/uL — ABNORMAL LOW (ref 3.87–5.11)
RDW: 13.7 % (ref 11.5–15.5)
Smear Review: NORMAL
WBC: 9.6 10*3/uL (ref 4.0–10.5)
nRBC: 0.8 % — ABNORMAL HIGH (ref 0.0–0.2)

## 2021-05-05 LAB — COMPREHENSIVE METABOLIC PANEL
ALT: 12 U/L (ref 0–44)
AST: 23 U/L (ref 15–41)
Albumin: 3.4 g/dL — ABNORMAL LOW (ref 3.5–5.0)
Alkaline Phosphatase: 69 U/L (ref 38–126)
Anion gap: 9 (ref 5–15)
BUN: 12 mg/dL (ref 6–20)
CO2: 24 mmol/L (ref 22–32)
Calcium: 8.6 mg/dL — ABNORMAL LOW (ref 8.9–10.3)
Chloride: 105 mmol/L (ref 98–111)
Creatinine, Ser: 0.9 mg/dL (ref 0.44–1.00)
GFR, Estimated: >60 mL/min (ref >60)
Glucose, Bld: 125 mg/dL — ABNORMAL HIGH (ref 70–99)
Potassium: 3.5 mmol/L (ref 3.5–5.1)
Sodium: 138 mmol/L (ref 135–145)
Total Bilirubin: 0.4 mg/dL (ref 0.3–1.2)
Total Protein: 6 g/dL — ABNORMAL LOW (ref 6.5–8.1)

## 2021-05-05 MED ORDER — SODIUM CHLORIDE 0.9 % IV SOLN
1160.0000 mg | Freq: Once | INTRAVENOUS | Status: DC
  Filled 2021-05-05: qty 58

## 2021-05-05 MED ORDER — DOXORUBICIN HCL CHEMO IV INJECTION 2 MG/ML
116.0000 mg | Freq: Once | INTRAVENOUS | Status: DC
  Filled 2021-05-05: qty 58

## 2021-05-05 MED ORDER — SODIUM CHLORIDE 0.9% FLUSH
10.0000 mL | INTRAVENOUS | Status: DC | PRN
  Administered 2021-05-05: 10 mL
  Filled 2021-05-05: qty 10

## 2021-05-05 MED ORDER — PALONOSETRON HCL INJECTION 0.25 MG/5ML: 0.2500 mg | Freq: Once | INTRAVENOUS | Status: DC

## 2021-05-05 MED ORDER — ALTEPLASE 2 MG IJ SOLR
2.0000 mg | Freq: Once | INTRAMUSCULAR | Status: AC | PRN
  Administered 2021-05-05: 2 mg
  Filled 2021-05-05: qty 2

## 2021-05-05 MED ORDER — SODIUM CHLORIDE 0.9 % IV SOLN
Freq: Once | INTRAVENOUS | Status: DC
  Filled 2021-05-05: qty 250

## 2021-05-05 MED ORDER — SODIUM CHLORIDE 0.9 % IV SOLN
10.0000 mg | Freq: Once | INTRAVENOUS | Status: DC
  Filled 2021-05-05: qty 1

## 2021-05-05 MED ORDER — SODIUM CHLORIDE 0.9 % IV SOLN
150.0000 mg | Freq: Once | INTRAVENOUS | Status: DC
  Filled 2021-05-05: qty 5

## 2021-05-05 MED ORDER — HEPARIN SOD (PORK) LOCK FLUSH 100 UNIT/ML IV SOLN
INTRAVENOUS | Status: AC
  Administered 2021-05-05: 500 [IU]
  Filled 2021-05-05: qty 5

## 2021-05-05 MED ORDER — HEPARIN SOD (PORK) LOCK FLUSH 100 UNIT/ML IV SOLN
500.0000 [IU] | Freq: Once | INTRAVENOUS | Status: AC | PRN
  Filled 2021-05-05: qty 5

## 2021-05-05 NOTE — Progress Notes (Signed)
Pt has no concerns at this time. 

## 2021-05-05 NOTE — Progress Notes (Signed)
Hematology/Oncology Consult note [facility]  Telephone:([phone] Fax:([phone]  Patient Care Team: Derinda Late, MD as PCP - General (Family Medicine) Christene Lye, MD (General Surgery) Rosina Lowenstein, MD (Obstetrics and Gynecology)   Name of the patient: Emma Cuevas  [id number]  03-Mar-1966   Date of visit: 05/05/21  Diagnosis- clinical prognostic stage IIIa right breast cancer T3 N1 M0 ER/PR positive HER2 negative    Chief complaint/ Reason for visit-on treatment assessment prior to cycle 2 of neoadjuvant dose dense AC chemotherapy  Heme/Onc history:  Patient is a 55 year old female who underwent a routine bilateral screening mammogram in September 2022 which showed calcifications in the right breast and prominent right axillary lymph node concerning for malignancy.  This was followed by diagnostic mammogram and ultrasound.  Mammogram showed extensive suspicious pleomorphic calcifications measuring up to 6.8 cm.  2 abnormal lymph nodes were seen in the right axilla on ultrasound as well.  Both the calcifications and the lymph node was biopsied.  Anterior and posterior end of the calcifications was consistent with invasive mammary carcinoma grade 3.  Lymph node was positive for metastatic carcinoma as well.  ER 91 to 100% positive PR 41 to 50% positive and HER2 negative.  Ki-67 40%   Menarche at the age of 55.  She is G2, P2 L2.  Age at first birth 20.  She used birth control pills for about 8 to 10 years.  She had a hysterectomy at the age of 78 but ovaries are still in situ.  Family history significant for breast cancer in her mother and paternal aunt.  Father with metastatic bladder cancer and maternal grandfather with pancreatic cancer.  Patient had BRCA 1 and 2 testing done in the past and was reportedly negative.   MRI bilateral breasts showed non-mass enhancement involving the upper outer lower outer and upper inner quadrants finding  10.9 x 8.4 x 6.8 cm.  Non-mass enhancement in the left breast spanning 1.6 x 1.3 x 0.8 cm.  6 metastatic right axillary lymph nodes.   CT chest abdomen and pelvis with contrast showed mild right axillary adenopathy compatible with metastatic disease but no evidence of distant metastatic disease.  5 mm left lower lobe nodule.  Bone scan was negative for metastatic disease.    Interval history-Patient tolerated cycle 1 well without significant side effects. No nausea, vomiting. She's a bit fatigued but no other specific complaints. No fevers or chills.   ECOG PS- 0 Pain scale- 0   Review of systems- Review of Systems  Constitutional:  Negative for chills, fever, malaise/fatigue and weight loss.  HENT:  Negative for congestion, ear discharge and nosebleeds.   Eyes:  Negative for blurred vision.  Respiratory:  Negative for cough, hemoptysis, sputum production, shortness of breath and wheezing.   Cardiovascular:  Negative for chest pain, palpitations, orthopnea and claudication.  Gastrointestinal:  Negative for abdominal pain, blood in stool, constipation, diarrhea, heartburn, melena, nausea and vomiting.  Genitourinary:  Negative for dysuria, flank pain, frequency, hematuria and urgency.  Musculoskeletal:  Negative for back pain, joint pain and myalgias.  Skin:  Negative for rash.  Neurological:  Negative for dizziness, tingling, focal weakness, seizures, weakness and headaches.  Endo/Heme/Allergies:  Does not bruise/bleed easily.  Psychiatric/Behavioral:  Negative for depression and suicidal ideas. The patient does not have insomnia.      Allergies  Allergen Reactions   Tape     Redness and burning    Past Medical History:  Diagnosis Date   Anemia    Benign neoplasm of breast 2013   left breast   BRCA negative 2015   Cancer [facility])    Family history of bladder cancer    Family history of breast cancer    Family history of malignant neoplasm of breast 2013   Family history of  pancreatic cancer    Screening for obesity     Past Surgical History:  Procedure Laterality Date   ABDOMINAL HYSTERECTOMY  2011   partial   BREAST BIOPSY Left 2013   BREAST BIOPSY Right 03/14/2021   Stereo bx-anterior calcs, "coil" clip-path pending   BREAST BIOPSY Right 03/14/2021   stereo bx-calcs, "Ribbon" clip-path pending   breast biopsy Right 03/14/2021   [place] Bx, Axilla, path pending   BREAST BIOPSY Left 04/30/2021   BREAST SURGERY Left 10/16/2011   left breast finesse biopsy   COLONOSCOPY WITH PROPOFOL N/A 12/05/2014   Procedure: COLONOSCOPY WITH PROPOFOL;  Surgeon: Christene Lye, MD;  Location: [facility] ENDOSCOPY;  Service: Endoscopy;  Laterality: N/A;   DILATION AND CURETTAGE OF UTERUS     PORTACATH PLACEMENT N/A 04/02/2021   Procedure: INSERTION PORT-A-CATH;  Surgeon: Herbert Pun, MD;  Location: [facility] ORS;  Service: General;  Laterality: N/A;    Social History   Socioeconomic History   Marital status: Married    Spouse name: Legrand Como   Number of children: Not on file   Years of education: Not on file   Highest education level: Not on file  Occupational History   Not on file  Tobacco Use   Smoking status: Never   Smokeless tobacco: Never  Substance and Sexual Activity   Alcohol use: No   Drug use: No   Sexual activity: Not on file  Other Topics Concern   Not on file  Social History Narrative   Not on file   Social Determinants of Health   Financial Resource Strain: Not on file  Food Insecurity: Not on file  Transportation Needs: Not on file  Physical Activity: Not on file  Stress: Not on file  Social Connections: Not on file  Intimate Partner Violence: Not on file    Family History  Problem Relation Age of Onset   Breast cancer Mother 63   Bladder Cancer Father 82   Breast cancer Paternal Aunt 56   Pancreatic cancer Paternal Uncle 67   Pancreatic cancer Maternal Grandfather 64   Cancer Cousin        in ear    Current Outpatient  Medications:    amoxicillin-clavulanate (AUGMENTIN) 875-125 MG tablet, Take 1 tablet by mouth 2 (two) times daily., Disp: 10 tablet, Rfl: 0   dexamethasone (DECADRON) 4 MG tablet, Take 2 tablets (8 mg total) by mouth daily. Take daily for 3 days after chemo. Take with food., Disp: 30 tablet, Rfl: 1   lidocaine-prilocaine (EMLA) cream, Apply to affected area once, Disp: 30 g, Rfl: 3   metroNIDAZOLE (METROGEL) 0.75 % gel, Apply 1 application topically daily as needed (rosacea)., Disp: , Rfl:    triamcinolone ointment (KENALOG) 0.5 %, Apply 1 application topically 2 (two) times daily. OVER THE PORT SITE FOR REDNESS, Disp: 30 g, Rfl: 0   acetaminophen (TYLENOL) 500 MG tablet, Take 1,000 mg by mouth every 6 (six) hours as needed for moderate pain. (Patient not taking: Reported on 04/21/2021), Disp: , Rfl:    LORazepam (ATIVAN) 0.5 MG tablet, Take 1 tablet (0.5 mg total) by mouth every 6 (six) hours as needed (Nausea  or vomiting). (Patient not taking: Reported on 04/07/2021), Disp: 30 tablet, Rfl: 0   ondansetron (ZOFRAN) 8 MG tablet, Take 1 tablet (8 mg total) by mouth 2 (two) times daily as needed. Start on the third day after chemotherapy. (Patient not taking: Reported on 04/07/2021), Disp: 30 tablet, Rfl: 1   prochlorperazine (COMPAZINE) 10 MG tablet, Take 1 tablet (10 mg total) by mouth every 6 (six) hours as needed (Nausea or vomiting). (Patient not taking: Reported on 04/07/2021), Disp: 30 tablet, Rfl: 1  Physical exam:  Vitals:   05/05/21 0854  BP: 121/61  Pulse: 77  Resp: 16  Temp: (!) 97.5 F (36.4 C)  SpO2: 100%  Weight: 182 lb 14.4 oz (83 kg)   Physical Exam Constitutional:      General: She is not in acute distress. Cardiovascular:     Rate and Rhythm: Normal rate and regular rhythm.     Heart sounds: Normal heart sounds.  Pulmonary:     Effort: Pulmonary effort is normal.     Breath sounds: Normal breath sounds.  Abdominal:     General: Bowel sounds are normal.      Palpations: Abdomen is soft.  Skin:    General: Skin is warm and dry.  Neurological:     Mental Status: She is alert and oriented to person, place, and time.  Chest wall: Erythematous nodular rash noted over port site which appears to be more consistent with irritant dermatitis rather than infection.  CMP Latest Ref Rng & Units 05/05/2021  Glucose 70 - 99 mg/dL 125(H)  BUN 6 - 20 mg/dL 12  Creatinine 0.44 - 1.00 mg/dL 0.90  Sodium 135 - 145 mmol/L 138  Potassium 3.5 - 5.1 mmol/L 3.5  Chloride 98 - 111 mmol/L 105  CO2 22 - 32 mmol/L 24  Calcium 8.9 - 10.3 mg/dL 8.6(L)  Total Protein 6.5 - 8.1 g/dL 6.0(L)  Total Bilirubin 0.3 - 1.2 mg/dL 0.4  Alkaline Phos 38 - 126 U/L 69  AST 15 - 41 U/L 23  ALT 0 - 44 U/L 12   CBC Latest Ref Rng & Units 05/05/2021  WBC 4.0 - 10.5 K/uL 9.6  Hemoglobin 12.0 - 15.0 g/dL 9.5(L)  Hematocrit 36.0 - 46.0 % 29.4(L)  Platelets 150 - 400 K/uL 201      NM Bone Scan Whole Body  Result Date: 04/09/2021 CLINICAL DATA:  Breast cancer, staging. EXAM: NUCLEAR MEDICINE WHOLE BODY BONE SCAN TECHNIQUE: Whole body anterior and posterior images were obtained approximately 3 hours after intravenous injection of radiopharmaceutical. RADIOPHARMACEUTICALS:  21.08 mCi Technetium-64mMDP IV COMPARISON:  CT April 01, 2021 FINDINGS: No convincing scintigraphic evidence of abnormal radiotracer uptake to suggest osteoblastic metastatic disease. Focus of abnormal radiotracer activity along the left lateral aspect of the lower sternum as well as along the sternomanubrial joint which appears to reflect productive degenerative change on CT April 01, 2021. Multifocal radiotracer uptake involving the shoulders, sternoclavicular joints, knees, ankles and feet in a pattern most consistent with degenerative arthropathy. Otherwise physiologic distribution of radiotracer activity. IMPRESSION: No convincing scintigraphic evidence of osteoblastic osseous metastatic disease. Foci of  abnormal radiotracer uptake along the left lateral aspect of the lower sternum as well as along the sternomanubrial joint which corresponds with productive degenerative change on CT April 01, 2021. Electronically Signed   By: JDahlia BailiffM.D.   On: 04/09/2021 10:34   MM CLIP PLACEMENT LEFT  Result Date: 04/30/2021 CLINICAL DATA:  Confirmation of clip placement after MRI guided core  needle biopsy an indeterminate 1.6 cm focus of non-mass enhancement in the outer LEFT breast at middle to posterior depth. EXAM: 2D and 3D DIAGNOSTIC LEFT MAMMOGRAM POST MRI BIOPSY COMPARISON:  Previous exam(s). FINDINGS: 2D and 3D full field CC and mediolateral images were obtained following MRI guided biopsy of non-mass enhancement in the outer LEFT breast. The dumbbell shaped tissue marking clip is appropriately positioned at the anterior margin of the biopsied enhancement. Interestingly, the S shaped tissue marking clip present on the screening mammogram 02/18/2021 was removed with the biopsy, indicating that the enhancement on MRI correlates with the previously biopsied lesion. Expected post biopsy changes are present without evidence of hematoma. IMPRESSION: 1. Appropriate positioning of the dumbbell shaped biopsy marking clip at the anterior margin of the biopsied enhancement in the outer LEFT breast. 2. The S shaped tissue marking clip present on the screening mammogram 02/18/2021 was removed with the biopsy, indicating that the enhancement on MRI correlates with the previously biopsied lesion in the outer LEFT breast. Final Assessment: Post Procedure Mammograms for Marker Placement Electronically Signed   By: Evangeline Dakin M.D.   On: 04/30/2021 11:04  MR LT BREAST BX W LOC DEV 1ST LESION IMAGE BX SPEC MR GUIDE  Addendum Date: 05/02/2021   ADDENDUM REPORT: 05/02/2021 09:39 ADDENDUM: Pathology revealed FIBROCYSTIC CHANGES WITH CALCIFICATIONS of the LEFT breast, outer, middle depth, (dumbbell clip). This was found to  be concordant by Dr. Peggye Fothergill. Pathology results were discussed with the patient by telephone by Stacie Acres, RN Nurse Navigator. The patient reported doing well after the biopsy with tenderness at the site. Post biopsy instructions and care were reviewed and questions were answered. The patient was encouraged to call [facility] at [facility] for any additional concerns. The patient has a recent diagnosis of RIGHT breast cancer and should follow her outlined treatment plan. Pathology results reported by Terie Purser, RN on 05/02/2021. Electronically Signed   By: Evangeline Dakin M.D.   On: 05/02/2021 09:39   Result Date: 05/02/2021 CLINICAL DATA:  55 year old with recent biopsy-proven extensive grade 3 invasive mammary carcinoma-no special type and high-grade DCIS involving the RIGHT breast and a biopsy-proven metastatic RIGHT axillary lymph node. Diagnostic MRI demonstrated non-mass enhancement in 3 quadrants of the RIGHT breast (upper outer quadrant, lower outer quadrant, upper inner quadrant) and 6 abnormal RIGHT axillary lymph nodes. There was a similar 1.6 cm focus of non-mass enhancement in the outer LEFT breast at middle to posterior depth which is sampled today. EXAM: MRI GUIDED CORE NEEDLE BIOPSY OF THE LEFT BREAST TECHNIQUE: Multiplanar, multisequence MR imaging of the LEFT breast was performed both before and after administration of intravenous contrast. CONTRAST:  8 mL Gadavist IV. COMPARISON:  Previous exams. FINDINGS: I met with the patient, and we discussed the procedure of MRI guided biopsy, including risks, benefits, and alternatives. Specifically, we discussed the risks of infection, bleeding, tissue injury, clip migration, and inadequate sampling. Informed, written consent was given. The usual time out protocol was performed immediately prior to the procedure. Lesion quadrant: LOWER OUTER QUADRANT. Using sterile technique with chlorhexidine as skin antisepsis, 1%  lidocaine and 1% lidocaine with epinephrine as local anesthetic, using MRI guidance, a 9 gauge Suros vacuum assisted core needle device was used perform biopsy of the non-mass enhancement in the outer LEFT breast using a lateral approach. At the conclusion of the procedure, a dumbbell shaped tissue marker clip was deployed into the biopsy cavity. Follow-up 2-view mammogram was performed and dictated  separately. IMPRESSION: MRI guided biopsy of non-mass enhancement involving the outer LEFT breast at middle to posterior depth. No apparent complications. Electronically Signed: By: Evangeline Dakin M.D. On: 04/30/2021 11:04    Assessment and plan- Patient is a 55 y.o. female with newly diagnosed clinical prognostic stage IIIa invasive mammary carcinoma of the right breast cT3 N1 M0 ER/PR positive HER2 negative.  She is here for on treatment assessment prior to cycle 3 of neoadjuvant dose dense AC chemotherapy  Counts okay to proceed with cycle 3 of neoadjuvant dose dense AC chemotherapy. She will be receiving Udenyca at home with the cycle. Reviewed that she will need an ultrasound after 4 cycles  vs MRI.   Contact dermatitis- at port site. Suspect related to adhesive. Monitor. Could consider topical hydrocortisone vs triamcinolone.   Disposition:  RTC as scheduled for port/labs, Dr. Janese Banks, cycle 4 of [facility] chemotherapy with Dr. Janese Banks on 05/19/21.   Addendum:  Unfortunately patient's port would not draw what could be described as 'brisk' blood flow which is requirement for administration of her chemotherapy. I will send her for a dye study and we will try to reschedule her chemotherapy asap based on results.    Visit Diagnosis No diagnosis found.  Beckey Rutter, DNP, AGNP-C [facility] [place] [facility] [phone] (clinic) 05/05/2021

## 2021-05-05 NOTE — Progress Notes (Signed)
Sluggish blood return with port flush.Port flushed well with no resistance and no edema/pain noted.  Beckey Rutter, NP made aware. Port placement has been verified previously. Per Beckey Rutter, NP, give patient Cathflo.   1051: Attempted to check for blood return after 30 minutes of Cathflo. No blood return noted at this time.  1221: Attempted to check blood return after 2 hours of dwell time for Cathflo. Sluggish blood return noted with only approx. 1-2 MLs of blood return. Beckey Rutter, NP made aware. Per Beckey Rutter, NP proceed with Darron Doom Study at this time instead of second dose of Cathflo, flush port with NS flush, and hold treatment today.  Port Dye Study scheduled for 05/06/21. Per Beckey Rutter, NP deaccess port, okay to flush port with Heparin.

## 2021-05-06 ENCOUNTER — Encounter: Payer: Self-pay | Admitting: Dermatology

## 2021-05-06 ENCOUNTER — Other Ambulatory Visit: Payer: Self-pay | Admitting: Nurse Practitioner

## 2021-05-06 ENCOUNTER — Telehealth: Payer: Self-pay | Admitting: Licensed Clinical Social Worker

## 2021-05-06 ENCOUNTER — Ambulatory Visit: Payer: Self-pay | Admitting: Licensed Clinical Social Worker

## 2021-05-06 ENCOUNTER — Encounter: Payer: Self-pay | Admitting: Licensed Clinical Social Worker

## 2021-05-06 ENCOUNTER — Ambulatory Visit
Admission: RE | Admit: 2021-05-06 | Discharge: 2021-05-06 | Disposition: A | Discharge status: Home / Self Care | Payer: Managed Care, Other (non HMO) | Source: Ambulatory Visit | Attending: Nurse Practitioner | Admitting: Nurse Practitioner

## 2021-05-06 ENCOUNTER — Encounter: Payer: Self-pay | Admitting: Oncology

## 2021-05-06 ENCOUNTER — Ambulatory Visit: Payer: Managed Care, Other (non HMO)

## 2021-05-06 DIAGNOSIS — Z1379 Encounter for other screening for genetic and chromosomal anomalies: Secondary | ICD-10-CM | POA: Insufficient documentation

## 2021-05-06 DIAGNOSIS — Z95828 Presence of other vascular implants and grafts: Secondary | ICD-10-CM

## 2021-05-06 DIAGNOSIS — Z452 Encounter for adjustment and management of vascular access device: Secondary | ICD-10-CM | POA: Insufficient documentation

## 2021-05-06 DIAGNOSIS — Z800 Family history of malignant neoplasm of digestive organs: Secondary | ICD-10-CM

## 2021-05-06 DIAGNOSIS — Z170 Estrogen receptor positive status [ER+]: Secondary | ICD-10-CM

## 2021-05-06 DIAGNOSIS — C50911 Malignant neoplasm of unspecified site of right female breast: Secondary | ICD-10-CM

## 2021-05-06 DIAGNOSIS — Z8052 Family history of malignant neoplasm of bladder: Secondary | ICD-10-CM

## 2021-05-06 HISTORY — PX: IR CV LINE INJECTION: IMG2294

## 2021-05-06 IMAGING — XA IR CENTRAL VENOUS CATHETER
1 series · 14 of 15 positions shown · IV contrast (IODINE)
Comparison: none

CLINICAL DATA: Patient with history of right-sided breast cancer
with a left internal jugular Port-A-Cath placed surgically on
[DATE]. Patient presents today for port catheter check given
difficulty aspirating with no blood return.

[Series 2: vasc extremity · 6 acquisitions, 14 frames shown]
[im 1/6]
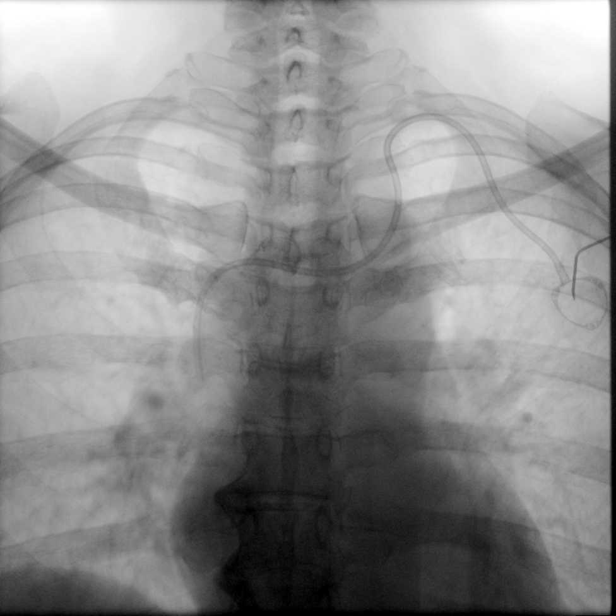
[im 2/6]
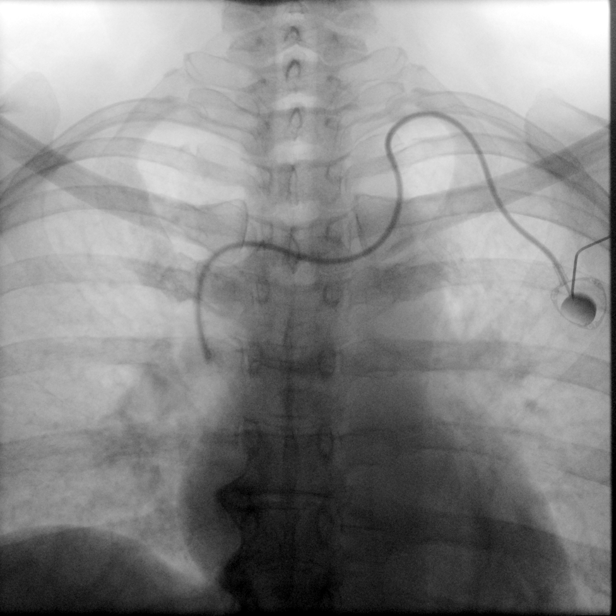
[im 3/6]
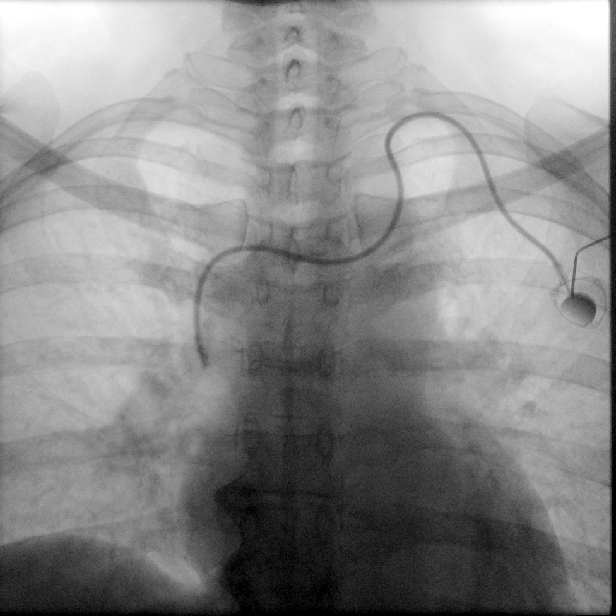
[im 3/6]
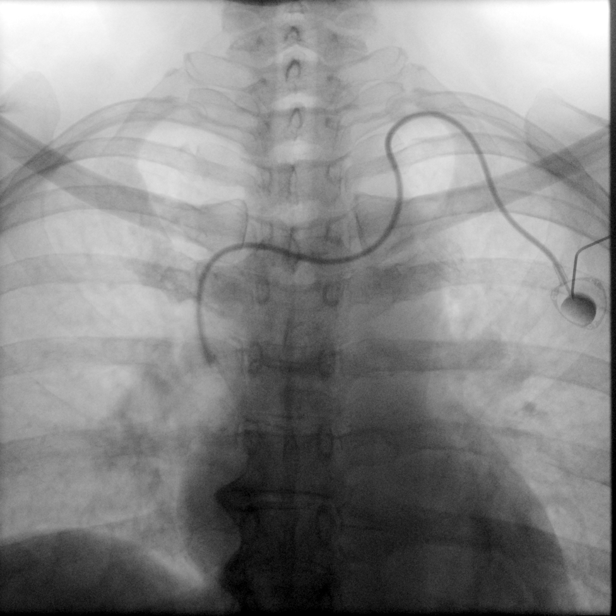
[im 3/6]
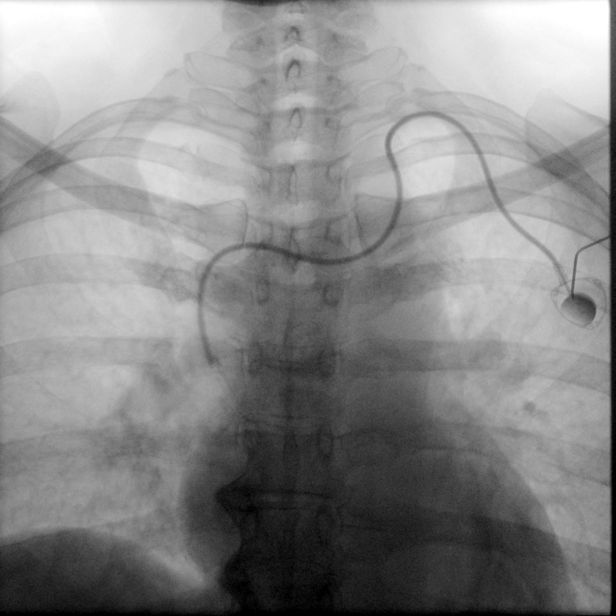
[im 3/6]
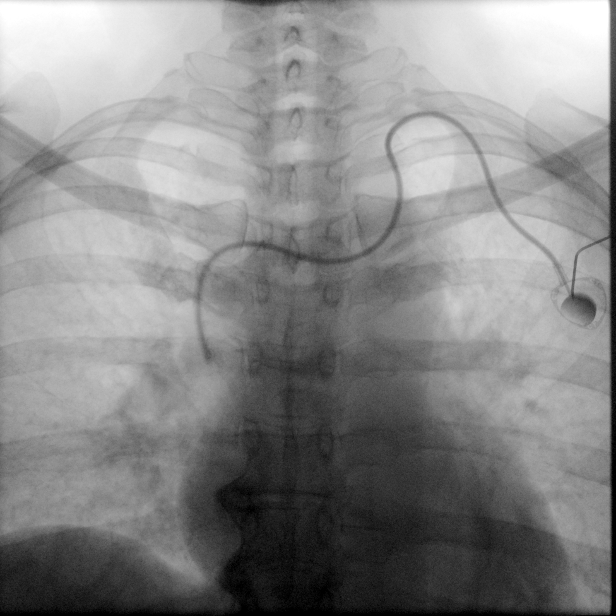
[im 4/6]
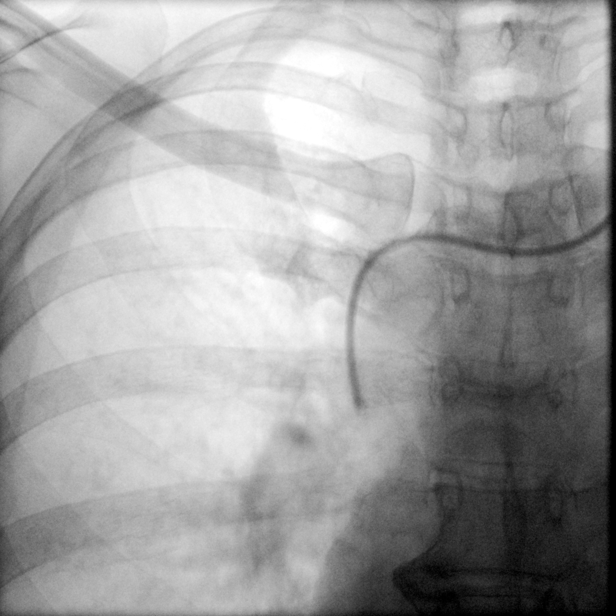
[im 5/6]
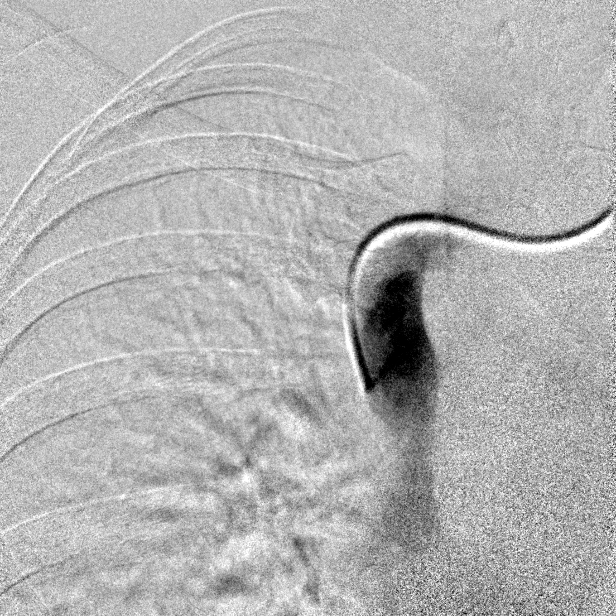
[im 5/6]
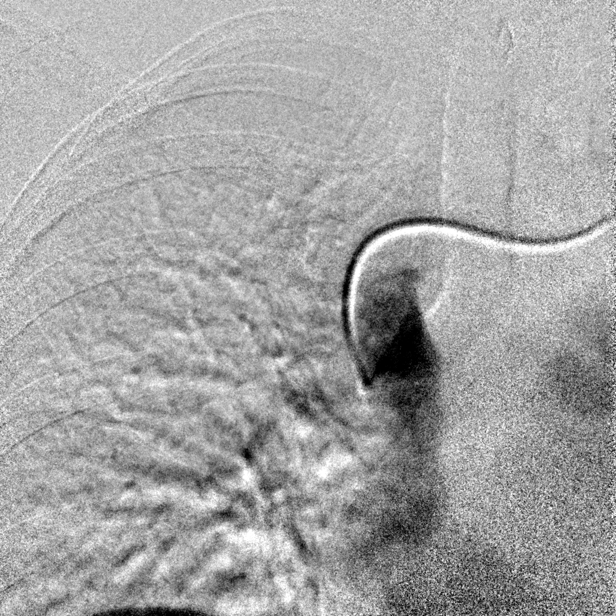
[im 5/6]
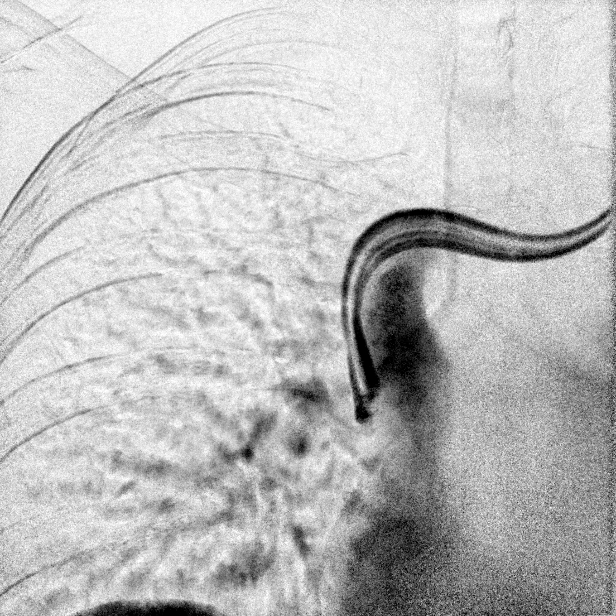
[im 6/6]
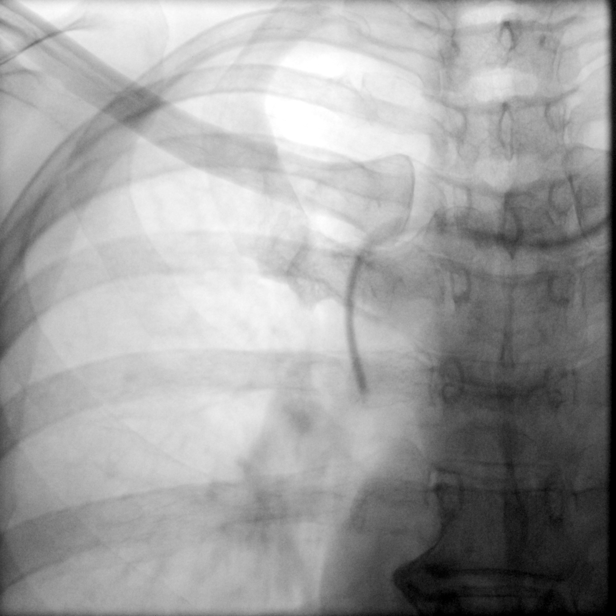
[im 6/6]
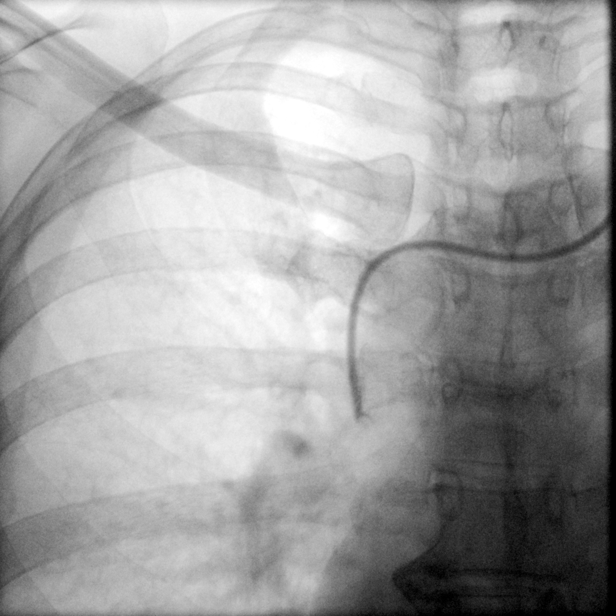
[im 6/6]
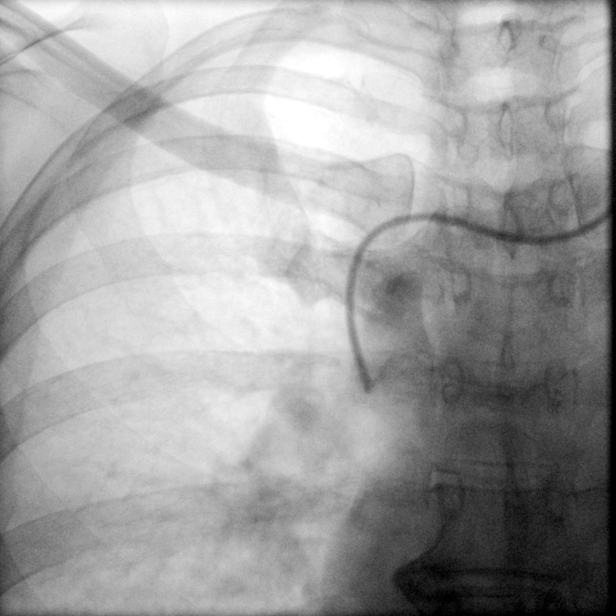
[im 6/6]
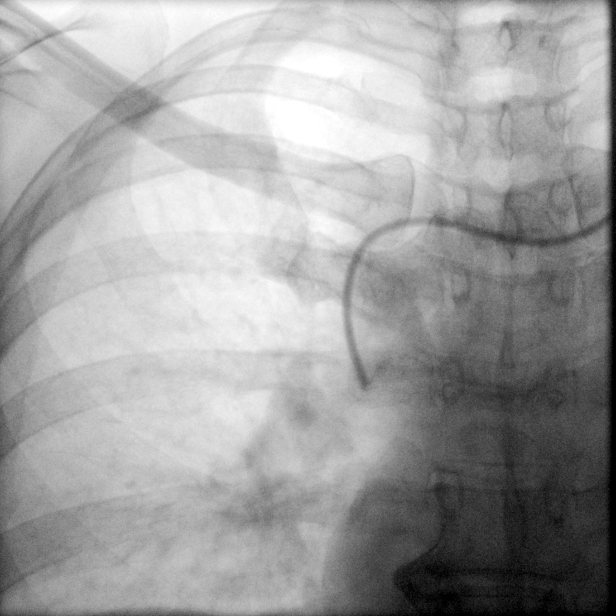

[14 of 15 positions shown; findings below may reference images not displayed]

EXAM:
CONTRAST INJECTION OF PORT A CATH UNDER FLUOROSCOPY

CONTRAST:  Omnipaque 350 total of 8mL

FLUOROSCOPY TIME:  Fluoroscopy Time: 18 seconds  (3.2 mGy).

COMPLICATIONS:
None immediate.

PROCEDURE:
Contrast was administered via the indwelling port after it was
accessed in a sterile fashion. Fluoroscopic spot images were
obtained of the catheter during injection. Heparin was injected into
the catheter and the port was de-accessed and a sterile bandage was
applied.
IMPRESSION: Successful fluoroscopic guided left sided port a catheter contrast
injection, findings reveal the presence of a fibrin sheath and
distal catheter tip at level of carina. Would recommend port a
catheter exchange for optimal placement of distal catheter tip into
the SVC/RA junction and disrupt fibrin sheath to allow for better
aspiration. The findings have been discussed today with the ordering
provider and the patient will be scheduled for port revision with
our service.

This exam was performed by BERNS, and was supervised
and interpreted by Dr. BERNS.

## 2021-05-06 MED ORDER — HEPARIN SOD (PORK) LOCK FLUSH 100 UNIT/ML IV SOLN
INTRAVENOUS | Status: AC
  Administered 2021-05-06: 500 [IU]
  Filled 2021-05-06: qty 5

## 2021-05-06 MED ORDER — IOHEXOL 350 MG/ML SOLN
8.0000 mL | Freq: Once | INTRAVENOUS | Status: AC | PRN
  Administered 2021-05-06: 8 mL via INTRAVENOUS
  Filled 2021-05-06: qty 10

## 2021-05-06 NOTE — Progress Notes (Signed)
HPI:  Emma Cuevas was previously seen in the [facility] due to a personal and family history of cancer and concerns regarding a hereditary predisposition to cancer. Please refer to our prior cancer genetics clinic note for more information regarding our discussion, assessment and recommendations, at the time. Emma Cuevas recent genetic test results were disclosed to her, as were recommendations warranted by these results. These results and recommendations are discussed in more detail below.  CANCER HISTORY:  Oncology History  Breast cancer, stage 1, estrogen receptor positive, right ([place])  03/25/2021 Initial Diagnosis   Breast cancer, stage 1, estrogen receptor positive, right ([place])   03/25/2021 Cancer Staging   Staging form: Breast, AJCC 8th Edition - Clinical stage from 03/25/2021: Stage IIIA (cT3, cN1, cM0, G3, ER+, PR+, HER2-) - Signed by Sindy Guadeloupe, MD on 03/25/2021 Histologic grading system: 3 grade system    04/07/2021 -  Chemotherapy   Patient is on Treatment Plan : BREAST NEOADJUVANT DOSE DENSE AC q14d / PACLitaxel q7d      Genetic Testing   Negative genetic testing. No pathogenic variants identified on the [facility] CancerNext-Expanded+RNA panel. The report date is 05/05/2021.  The CancerNext-Expanded + RNAinsight gene panel offered by [employer] and includes sequencing and rearrangement analysis for the following 77 genes: IP, ALK, APC*, ATM*, AXIN2, BAP1, BARD1, BLM, BMPR1A, BRCA1*, BRCA2*, BRIP1*, CDC73, CDH1*,CDK4, CDKN1B, CDKN2A, CHEK2*, CTNNA1, DICER1, FANCC, FH, FLCN, GALNT12, KIF1B, LZTR1, MAX, MEN1, MET, MLH1*, MSH2*, MSH3, MSH6*, MUTYH*, NBN, NF1*, NF2, NTHL1, PALB2*, PHOX2B, PMS2*, POT1, PRKAR1A, PTCH1, PTEN*, RAD51C*, RAD51D*,RB1, RECQL, RET, SDHA, SDHAF2, SDHB, SDHC, SDHD, SMAD4, SMARCA4, SMARCB1, SMARCE1, STK11, SUFU, TMEM127, TP53*,TSC1, TSC2, VHL and XRCC2 (sequencing and deletion/duplication); EGFR, EGLN1, HOXB13, KIT, MITF, PDGFRA, POLD1 and  POLE (sequencing only); EPCAM and GREM1 (deletion/duplication only).     FAMILY HISTORY:  We obtained a detailed, 4-generation family history.  Significant diagnoses are listed below: Family History  Problem Relation Age of Onset   Breast cancer Mother 10   Bladder Cancer Father 31   Breast cancer Paternal Aunt 32   Pancreatic cancer Paternal Uncle 75   Pancreatic cancer Maternal Grandfather 27   Cancer Cousin        in ear   Emma Cuevas has 1 son, 76, and 1 daughter, 67. She has 1 sister, 15, and 1 brother, 53, no cancers.   Emma Cuevas mother had breast cancer at 11, treated with lumpectomy and radiation, and is living at 23. Patient had 3 maternal uncles, 6 maternal aunts, no cancers. A maternal cousin had cancer in her ear. Maternal grandmother died at  65. Grandfather had pancreatic cancer at 56 and died at 76.   Emma Cuevas father had bladder cancer at 5 and became metastatic and died at 38. Patient had 2 paternal uncles, 1 aunt. An uncle had pancreatic cancer at 39 and died at 57. An aunt had breast cancer at 39 and is living in her 79s. Paternal grandmother died at 44, grandfather died at 61.   Emma Cuevas is unaware of previous family history of genetic testing for hereditary cancer risks. Patient's maternal ancestors are of unknown descent, and paternal ancestors are of unknown descent. There is no reported Ashkenazi Jewish ancestry. There is no known consanguinity.      GENETIC TEST RESULTS: Genetic testing reported out on 05/05/2021 through the [employer] CancerNext-Expanded+RNA cancer panel found no pathogenic mutations.   The CancerNext-Expanded + RNAinsight gene panel offered by [facility] and includes sequencing and  rearrangement analysis for the following 77 genes: IP, ALK, APC*, ATM*, AXIN2, BAP1, BARD1, BLM, BMPR1A, BRCA1*, BRCA2*, BRIP1*, CDC73, CDH1*,CDK4, CDKN1B, CDKN2A, CHEK2*, CTNNA1, DICER1, FANCC, FH, FLCN, GALNT12, KIF1B, LZTR1, MAX, MEN1, MET, MLH1*, MSH2*,  MSH3, MSH6*, MUTYH*, NBN, NF1*, NF2, NTHL1, PALB2*, PHOX2B, PMS2*, POT1, PRKAR1A, PTCH1, PTEN*, RAD51C*, RAD51D*,RB1, RECQL, RET, SDHA, SDHAF2, SDHB, SDHC, SDHD, SMAD4, SMARCA4, SMARCB1, SMARCE1, STK11, SUFU, TMEM127, TP53*,TSC1, TSC2, VHL and XRCC2 (sequencing and deletion/duplication); EGFR, EGLN1, HOXB13, KIT, MITF, PDGFRA, POLD1 and POLE (sequencing only); EPCAM and GREM1 (deletion/duplication only).   The test report has been scanned into EPIC and is located under the Molecular Pathology section of the Results Review tab.  A portion of the result report is included below for reference.     We discussed that because current genetic testing is not perfect, it is possible there may be a gene mutation in one of these genes that current testing cannot detect, but that chance is small.  There could be another gene that has not yet been discovered, or that we have not yet tested, that is responsible for the cancer diagnoses in the family. It is also possible there is a hereditary cause for the cancer in the family that Emma Cuevas did not inherit and therefore was not identified in her testing.  Therefore, it is important to remain in touch with cancer genetics in the future so that we can continue to offer Emma Cuevas the most up to date genetic testing.   ADDITIONAL GENETIC TESTING: We discussed with Emma Cuevas that her genetic testing was fairly extensive.  If there are genes identified to increase cancer risk that can be analyzed in the future, we would be happy to discuss and coordinate this testing at that time.    CANCER SCREENING RECOMMENDATIONS: Emma Cuevas test result is considered negative (normal).  This means that we have not identified a hereditary cause for her  personal and family history of cancer at this time. Most cancers happen by chance and this negative test suggests that her cancer may fall into this category.    While reassuring, this does not definitively rule out a hereditary  predisposition to cancer. It is still possible that there could be genetic mutations that are undetectable by current technology. There could be genetic mutations in genes that have not been tested or identified to increase cancer risk.  Therefore, it is recommended she continue to follow the cancer management and screening guidelines provided by her oncology and primary healthcare provider.   An individual's cancer risk and medical management are not determined by genetic test results alone. Overall cancer risk assessment incorporates additional factors, including personal medical history, family history, and any available genetic information that may result in a personalized plan for cancer prevention and surveillance.  RECOMMENDATIONS FOR FAMILY MEMBERS:  Relatives in this family might be at some increased risk of developing cancer, over the general population risk, simply due to the family history of cancer.  We recommended female relatives in this family have a yearly mammogram beginning at age 81, or 65 years younger than the earliest onset of cancer, an annual clinical breast exam, and perform monthly breast self-exams. Female relatives in this family should also have a gynecological exam as recommended by their primary provider.  All family members should be referred for colonoscopy starting at age 42.   It is also possible there is a hereditary cause for the cancer in Emma Cuevas's family that she did not inherit and therefore  was not identified in her.  Based on Emma Cuevas's family history, we recommended to her family members related to those with pancreatic cancer have genetic counseling and testing. Emma Cuevas will let us know if we can be of any assistance in coordinating genetic counseling and/or testing for these family members.  FOLLOW-UP: Lastly, we discussed with Emma Cuevas that cancer genetics is a rapidly advancing field and it is possible that new genetic tests will be appropriate  for her and/or her family members in the future. We encouraged her to remain in contact with cancer genetics on an annual basis so we can update her personal and family histories and let her know of advances in cancer genetics that may benefit this family.   Our contact number was provided. Emma Cuevas questions were answered to her satisfaction, and she knows she is welcome to call us at anytime with additional questions or concerns.   Faith Rogue, MS, [facility] Genetic Counselor [email or web address] Phone: [phone]

## 2021-05-06 NOTE — Telephone Encounter (Signed)
Revealed negative genetic testing.  This normal result is reassuring and indicates that it is unlikely Emma Cuevas's cancer is due to a hereditary cause.  It is unlikely that there is an increased risk of another cancer due to a mutation in one of these genes.  However, genetic testing is not perfect, and cannot definitively rule out a hereditary cause.  It will be important for her to keep in contact with genetics to learn if any additional testing may be needed in the future.

## 2021-05-07 ENCOUNTER — Other Ambulatory Visit: Payer: Self-pay | Admitting: Radiology

## 2021-05-07 ENCOUNTER — Inpatient Hospital Stay: Payer: Managed Care, Other (non HMO)

## 2021-05-08 ENCOUNTER — Other Ambulatory Visit: Payer: Self-pay | Admitting: Nurse Practitioner

## 2021-05-08 ENCOUNTER — Encounter: Payer: Self-pay | Admitting: Radiology

## 2021-05-08 ENCOUNTER — Ambulatory Visit
Admission: RE | Admit: 2021-05-08 | Discharge: 2021-05-08 | Disposition: A | Discharge status: Home / Self Care | Payer: Managed Care, Other (non HMO) | Source: Ambulatory Visit | Attending: Nurse Practitioner | Admitting: Nurse Practitioner

## 2021-05-08 ENCOUNTER — Other Ambulatory Visit: Payer: Self-pay

## 2021-05-08 DIAGNOSIS — C50911 Malignant neoplasm of unspecified site of right female breast: Secondary | ICD-10-CM | POA: Insufficient documentation

## 2021-05-08 DIAGNOSIS — Y848 Other medical procedures as the cause of abnormal reaction of the patient, or of later complication, without mention of misadventure at the time of the procedure: Secondary | ICD-10-CM | POA: Insufficient documentation

## 2021-05-08 DIAGNOSIS — T82514A Breakdown (mechanical) of infusion catheter, initial encounter: Secondary | ICD-10-CM | POA: Diagnosis present

## 2021-05-08 HISTORY — PX: IR IMAGING GUIDED PORT INSERTION: IMG5740

## 2021-05-08 IMAGING — US IR IMAGING GUIDED PORT INSERTION
1 series · 1 of 1 positions shown · non-contrast
Comparison: Port injection-[DATE]

INDICATION: History of right-sided breast cancer, now with malfunctioning
surgically placed port a catheter.

Patient presents today for definitive Port a catheter replacement.
EXAM:
IMPLANTED PORT A CATH PLACEMENT WITH ULTRASOUND AND FLUOROSCOPIC
GUIDANCE

[Series 2: ir imaging guided port insertion · 0.07mm/px · 1 of 1 slices shown]
[im 1/1]
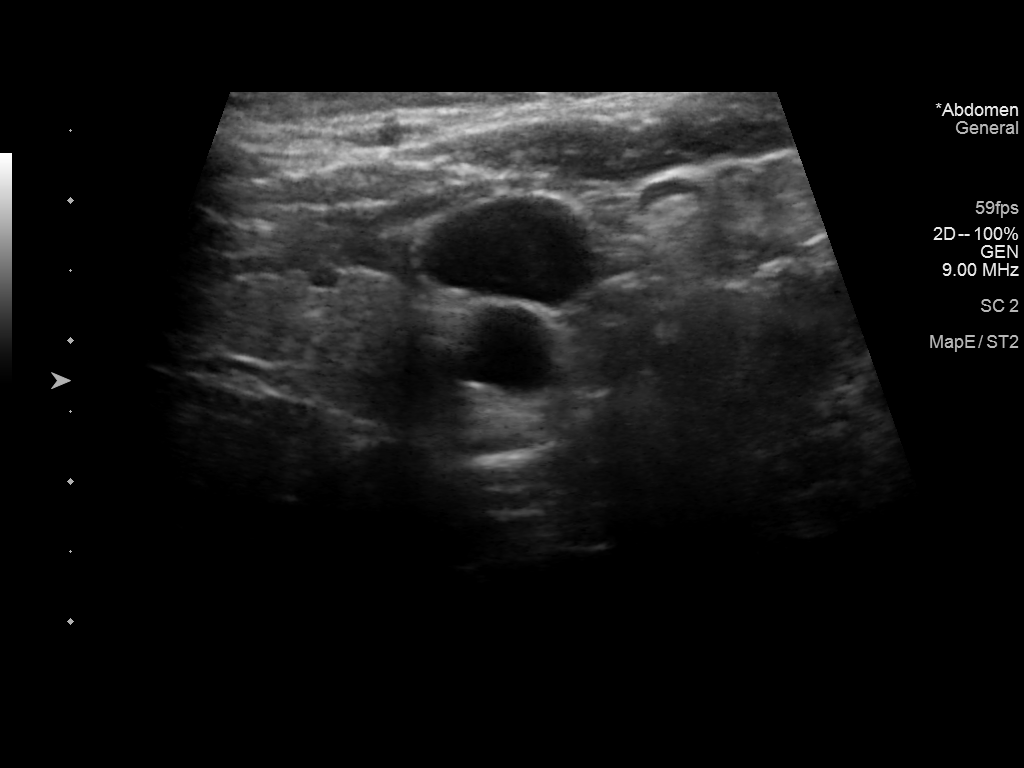

[1 of 1 positions shown; findings below may reference images not displayed]

MEDICATIONS:
None

ANESTHESIA/SEDATION:
Moderate (conscious) sedation was employed during this procedure as
administered by the [REDACTED].

A total of Versed 2 mg and Fentanyl 100 mcg was administered
intravenously.

Moderate Sedation Time: 22 minutes. The patient's level of
consciousness and vital signs were monitored continuously by
radiology nursing throughout the procedure under my direct
supervision.

CONTRAST:  None

FLUOROSCOPY TIME:  24 seconds (2.1 mGy)

COMPLICATIONS:
None immediate.

PROCEDURE:
The procedure, risks, benefits, and alternatives were explained to
the patient. Questions regarding the procedure were encouraged and
answered. The patient understands and consents to the procedure.

The left neck and chest were prepped with chlorhexidine in a sterile
fashion, and a sterile drape was applied covering the operative
field. Maximum barrier sterile technique with sterile gowns and
gloves were used for the procedure. A timeout was performed prior to
the initiation of the procedure. Local anesthesia was provided with
1% lidocaine with epinephrine.

After creating a small venotomy incision, a micropuncture kit was
utilized to access the internal jugular vein. Real-time ultrasound
guidance was utilized for vascular access including the acquisition
of a permanent ultrasound image documenting patency of the accessed
vessel. The microwire was utilized to measure appropriate catheter
length.

Next, the surgically placed port a catheter reservoir was dissected
and the surgically placed port a Catheter was removed intact

Utilizing the same port pocket, a new single lumen "ISP" sized power
injectable port was chosen for placement. The 8 Fr catheter was
tunneled from the port pocket site to the venotomy incision. The
port was placed in the pocket. The external catheter was trimmed to
appropriate length. At the venotomy, an 8 Fr peel-away sheath was
placed over a guidewire under fluoroscopic guidance. The catheter
was then placed through the sheath and the sheath was removed. Final
catheter positioning was confirmed and documented with a
fluoroscopic spot radiograph. The port was accessed with KLEVER
needle, aspirated and flushed with heparinized saline.

The venotomy site was closed with an interrupted 4-0 Vicryl suture.
The port pocket incision was closed with interrupted 2-0 Vicryl
suture. The skin was opposed with a running subcuticular 4-0 Vicryl
suture. Dermabond and KLEVER were applied to both incisions.
Dressings were applied. The patient tolerated the procedure well
without immediate post procedural complication.
FINDINGS: Preprocedural spot fluoroscopic image demonstrates unchanged
positioning of malpositioned port a catheter with end overlying
expected location the superior aspect of the SVC.

After definitive Port a catheter replacement, the [REDACTED] a
catheter tip lies within the superior cavoatrial junction. The
catheter aspirates and flushes normally and is ready for immediate
use.
IMPRESSION: Successful revision of left internal jugular approach power
injectable Port-A-Cath with tip now appropriately positioned at the
level of the superior cavoatrial junction. The catheter is ready for
immediate use.

## 2021-05-08 IMAGING — XA IR IMAGING GUIDED PORT INSERTION
1 series · 2 of 2 positions shown · non-contrast
Comparison: Port injection-[DATE]

INDICATION: History of right-sided breast cancer, now with malfunctioning
surgically placed port a catheter.

Patient presents today for definitive Port a catheter replacement.
EXAM:
IMPLANTED PORT A CATH PLACEMENT WITH ULTRASOUND AND FLUOROSCOPIC
GUIDANCE

[Series 2: interv standard · 2 of 2 slices shown]
[im 1/2]
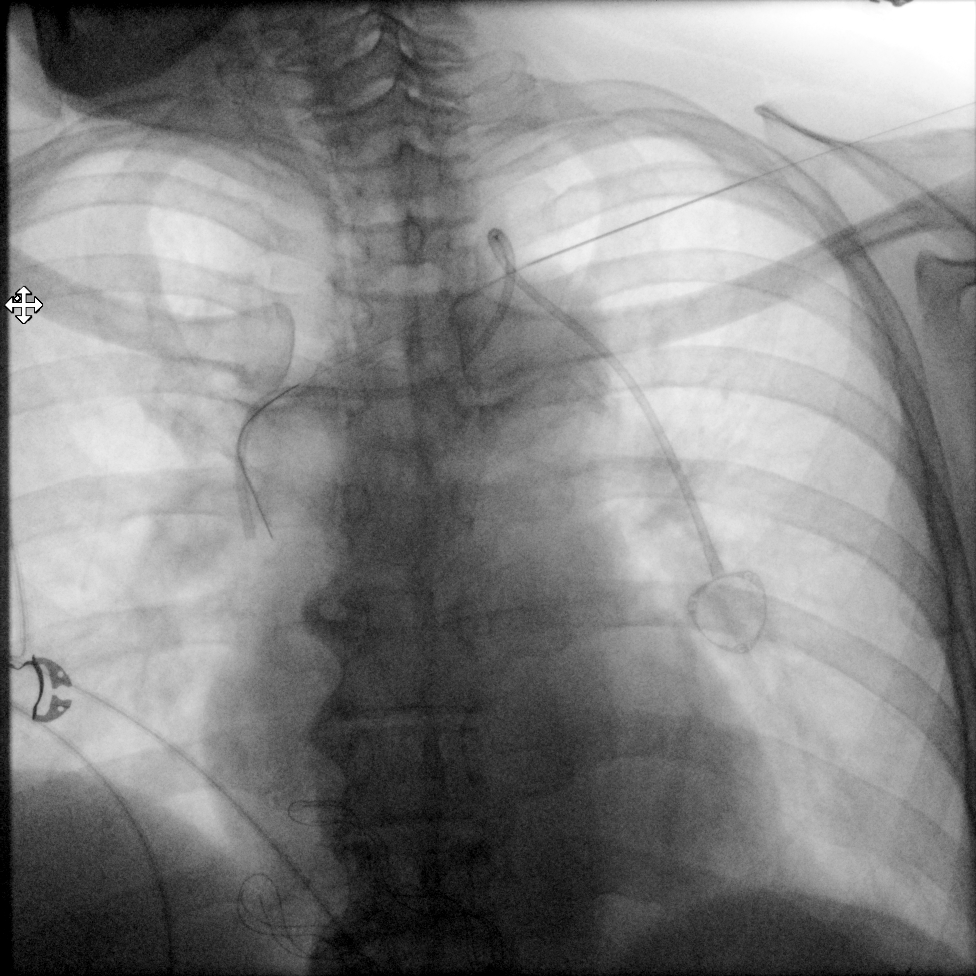
[im 2/2]
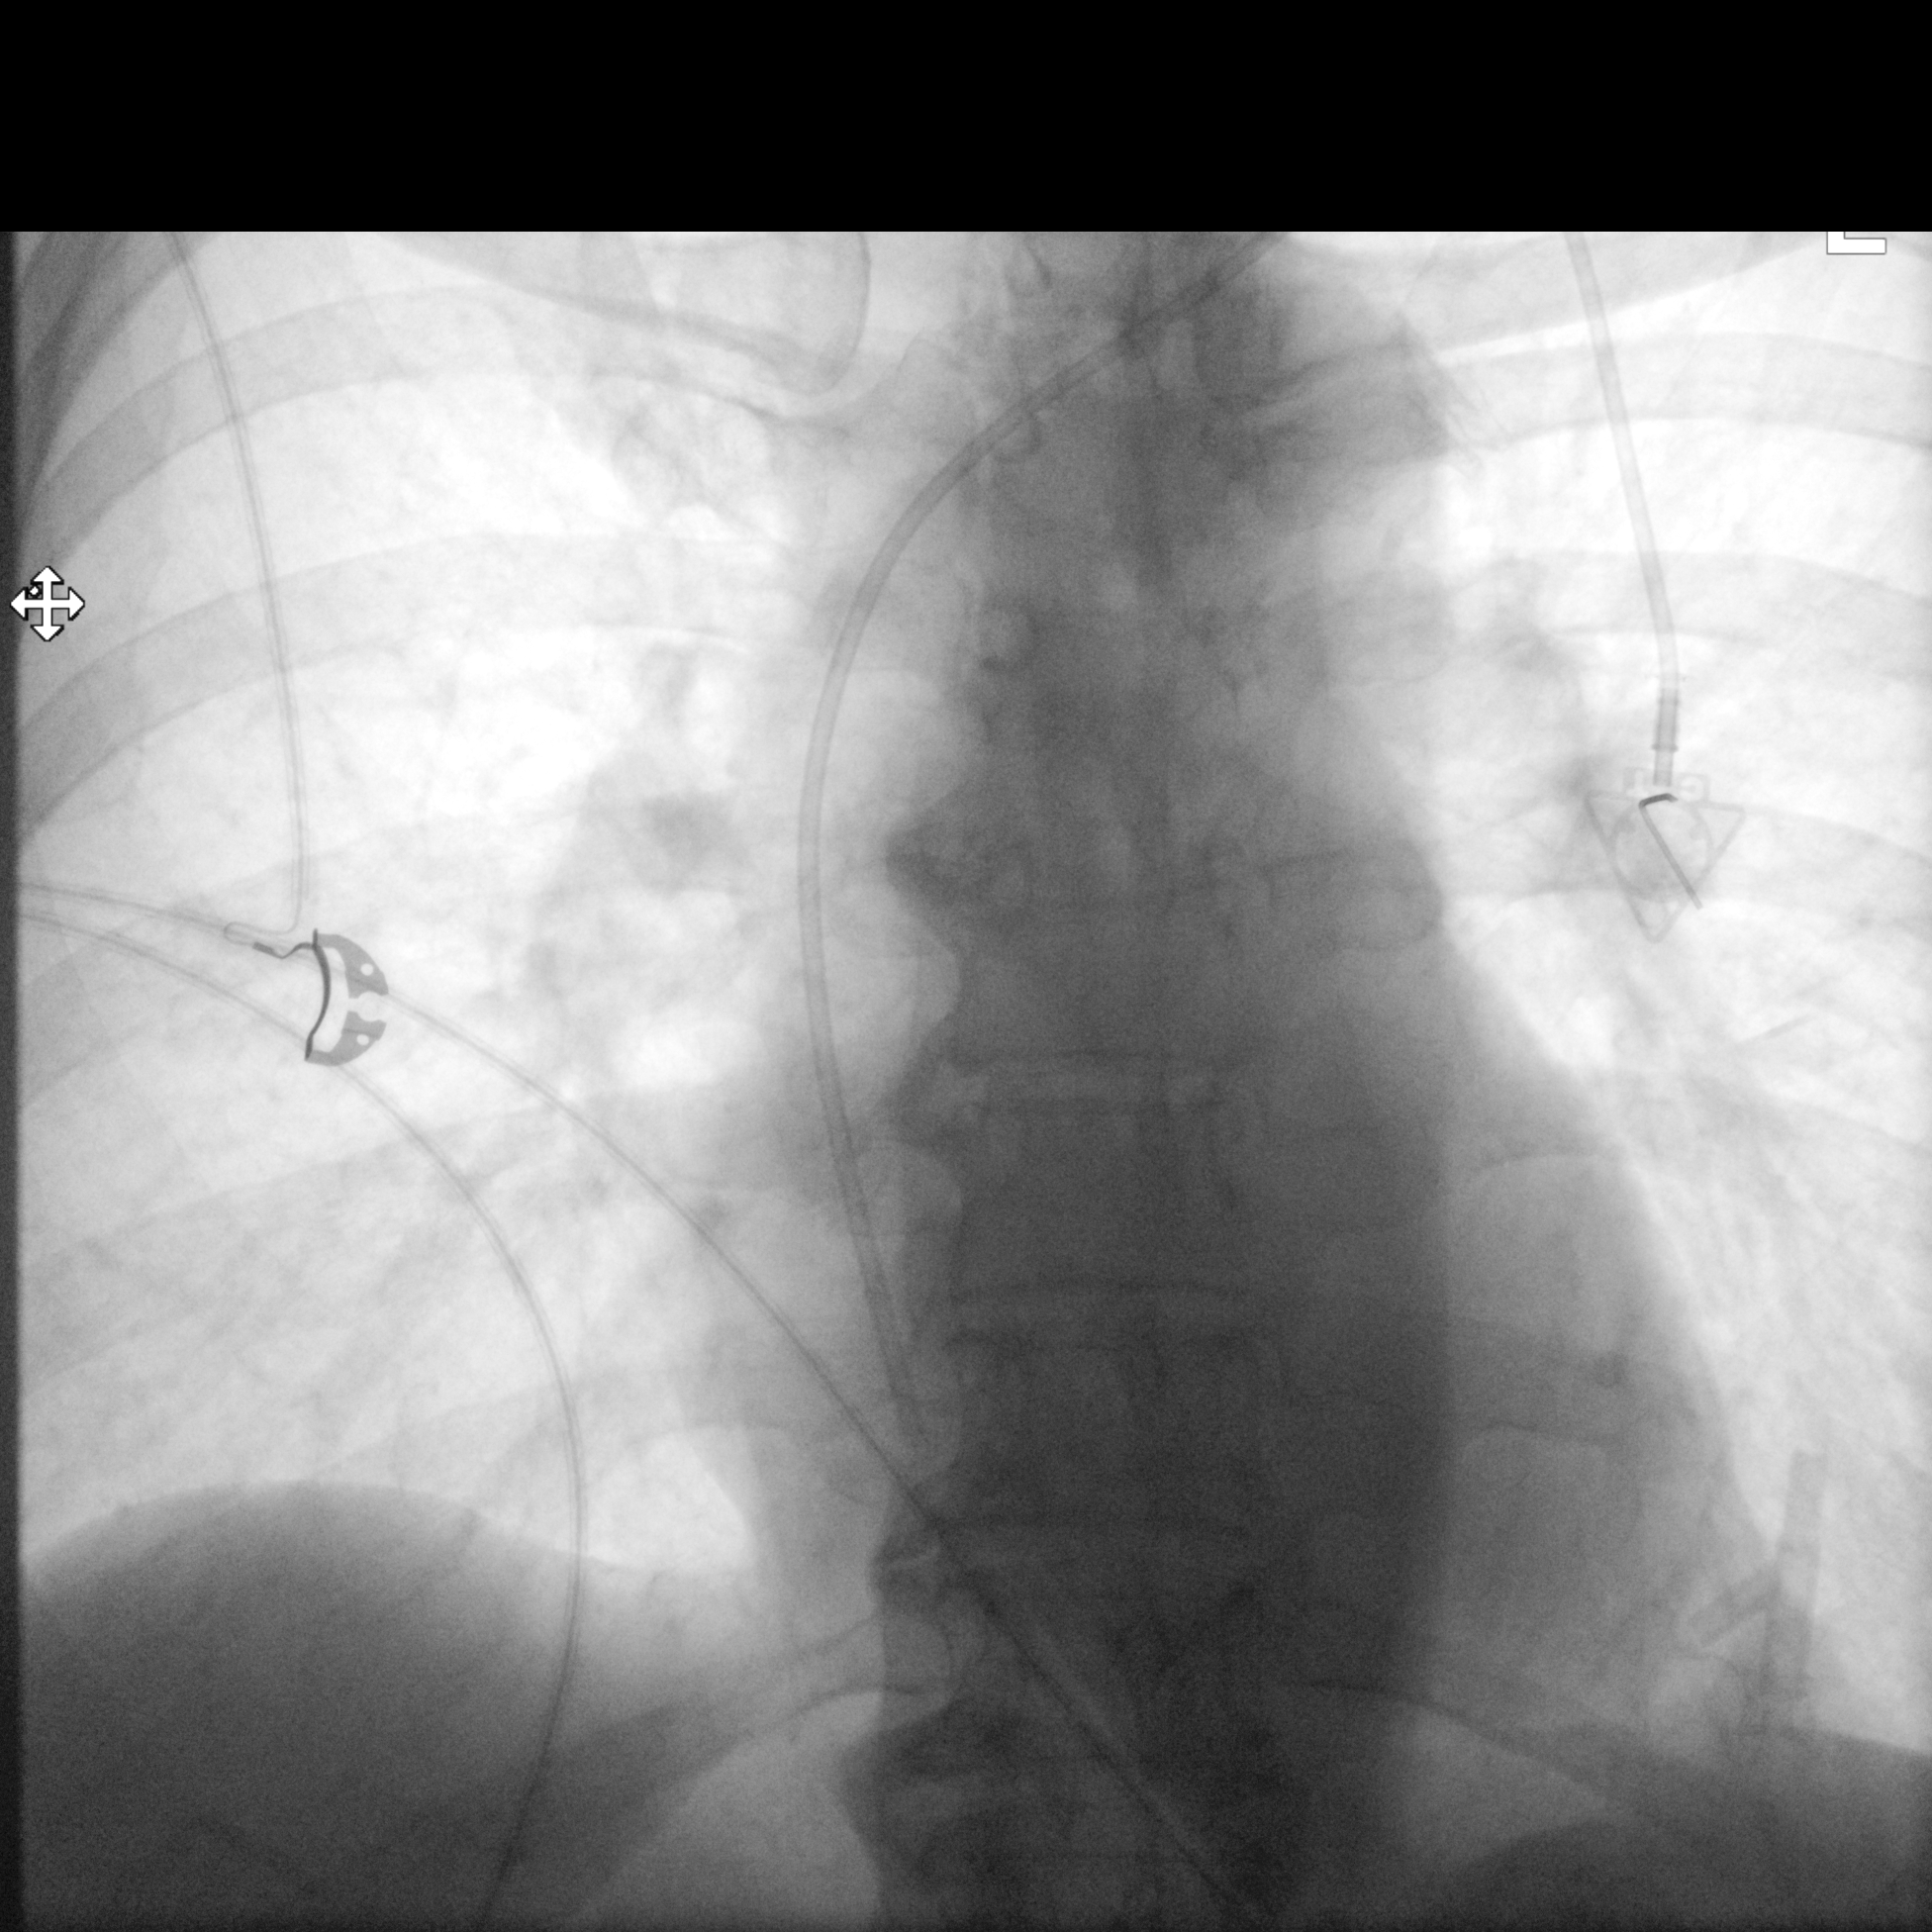

[2 of 2 positions shown; findings below may reference images not displayed]

MEDICATIONS:
None

ANESTHESIA/SEDATION:
Moderate (conscious) sedation was employed during this procedure as
administered by the [REDACTED].

A total of Versed 2 mg and Fentanyl 100 mcg was administered
intravenously.

Moderate Sedation Time: 22 minutes. The patient's level of
consciousness and vital signs were monitored continuously by
radiology nursing throughout the procedure under my direct
supervision.

CONTRAST:  None

FLUOROSCOPY TIME:  24 seconds (2.1 mGy)

COMPLICATIONS:
None immediate.

PROCEDURE:
The procedure, risks, benefits, and alternatives were explained to
the patient. Questions regarding the procedure were encouraged and
answered. The patient understands and consents to the procedure.

The left neck and chest were prepped with chlorhexidine in a sterile
fashion, and a sterile drape was applied covering the operative
field. Maximum barrier sterile technique with sterile gowns and
gloves were used for the procedure. A timeout was performed prior to
the initiation of the procedure. Local anesthesia was provided with
1% lidocaine with epinephrine.

After creating a small venotomy incision, a micropuncture kit was
utilized to access the internal jugular vein. Real-time ultrasound
guidance was utilized for vascular access including the acquisition
of a permanent ultrasound image documenting patency of the accessed
vessel. The microwire was utilized to measure appropriate catheter
length.

Next, the surgically placed port a catheter reservoir was dissected
and the surgically placed port a Catheter was removed intact

Utilizing the same port pocket, a new single lumen "ISP" sized power
injectable port was chosen for placement. The 8 Fr catheter was
tunneled from the port pocket site to the venotomy incision. The
port was placed in the pocket. The external catheter was trimmed to
appropriate length. At the venotomy, an 8 Fr peel-away sheath was
placed over a guidewire under fluoroscopic guidance. The catheter
was then placed through the sheath and the sheath was removed. Final
catheter positioning was confirmed and documented with a
fluoroscopic spot radiograph. The port was accessed with KLEVER
needle, aspirated and flushed with heparinized saline.

The venotomy site was closed with an interrupted 4-0 Vicryl suture.
The port pocket incision was closed with interrupted 2-0 Vicryl
suture. The skin was opposed with a running subcuticular 4-0 Vicryl
suture. Dermabond and KLEVER were applied to both incisions.
Dressings were applied. The patient tolerated the procedure well
without immediate post procedural complication.
FINDINGS: Preprocedural spot fluoroscopic image demonstrates unchanged
positioning of malpositioned port a catheter with end overlying
expected location the superior aspect of the SVC.

After definitive Port a catheter replacement, the [REDACTED] a
catheter tip lies within the superior cavoatrial junction. The
catheter aspirates and flushes normally and is ready for immediate
use.
IMPRESSION: Successful revision of left internal jugular approach power
injectable Port-A-Cath with tip now appropriately positioned at the
level of the superior cavoatrial junction. The catheter is ready for
immediate use.

## 2021-05-08 MED ORDER — LIDOCAINE-EPINEPHRINE 1 %-1:100000 IJ SOLN
INTRAMUSCULAR | Status: AC
  Administered 2021-05-08: 18 mL
  Filled 2021-05-08: qty 1

## 2021-05-08 MED ORDER — MIDAZOLAM HCL 5 MG/5ML IJ SOLN
INTRAMUSCULAR | Status: AC | PRN
  Administered 2021-05-08: 1 mg via INTRAVENOUS

## 2021-05-08 MED ORDER — MIDAZOLAM HCL 2 MG/2ML IJ SOLN
INTRAMUSCULAR | Status: AC | PRN
  Administered 2021-05-08: 1 mg via INTRAVENOUS

## 2021-05-08 MED ORDER — HEPARIN SOD (PORK) LOCK FLUSH 100 UNIT/ML IV SOLN
INTRAVENOUS | Status: AC
  Administered 2021-05-08: 500 [IU]
  Filled 2021-05-08: qty 5

## 2021-05-08 MED ORDER — SODIUM CHLORIDE 0.9 % IV SOLN
INTRAVENOUS | Status: DC
  Filled 2021-05-08: qty 1000

## 2021-05-08 MED ORDER — FENTANYL CITRATE (PF) 100 MCG/2ML IJ SOLN
INTRAMUSCULAR | Status: AC
  Filled 2021-05-08: qty 2

## 2021-05-08 MED ORDER — MIDAZOLAM HCL 2 MG/2ML IJ SOLN
INTRAMUSCULAR | Status: AC
  Filled 2021-05-08: qty 2

## 2021-05-08 MED ORDER — FENTANYL CITRATE (PF) 100 MCG/2ML IJ SOLN
INTRAMUSCULAR | Status: AC | PRN
  Administered 2021-05-08 (×2): 50 ug via INTRAVENOUS

## 2021-05-08 NOTE — Progress Notes (Signed)
Patient clinically stable post Port placement per Dr Pascal Lux, tolerated well. Vitals stable pre and post procedure. Received Versed 2 mg along with Fentanyl 100 mcg IV for procedure. Report given to [facility] Rn post procedure/specials.

## 2021-05-08 NOTE — Consult Note (Signed)
Chief Complaint: Malfunctioning surgically placed portacatheter  Referring Physician(s): Janese Banks  Patient Status: [facility] - Out-pt  History of Present Illness: Emma Cuevas is a 55 y.o. female with recent diagnosis of right-sided breast cancer, post surgical placement of a left jugular approach portacatheter on 04/02/2021, now with catheter malfunction secondary to development of a significant fibrin sheath about its tip.  Patient presents today for definitive portacatheter revision.  The patient is accompanied by her's husband who serves as her own historian.  Patient is currently without complaint.  Specifically, no chest pain, shortness of breath, fever or chills.  Past Medical History:  Diagnosis Date   Anemia    Benign neoplasm of breast 2013   left breast   BRCA negative 2015   Cancer [facility])    Family history of bladder cancer    Family history of breast cancer    Family history of malignant neoplasm of breast 2013   Family history of pancreatic cancer    Screening for obesity     Past Surgical History:  Procedure Laterality Date   ABDOMINAL HYSTERECTOMY  2011   partial   BREAST BIOPSY Left 2013   BREAST BIOPSY Right 03/14/2021   Stereo bx-anterior calcs, "coil" clip-path pending   BREAST BIOPSY Right 03/14/2021   stereo bx-calcs, "Ribbon" clip-path pending   breast biopsy Right 03/14/2021   [place] Bx, Axilla, path pending   BREAST BIOPSY Left 04/30/2021   BREAST SURGERY Left 10/16/2011   left breast finesse biopsy   COLONOSCOPY WITH PROPOFOL N/A 12/05/2014   Procedure: COLONOSCOPY WITH PROPOFOL;  Surgeon: Christene Lye, MD;  Location: [facility] ENDOSCOPY;  Service: Endoscopy;  Laterality: N/A;   DILATION AND CURETTAGE OF UTERUS     IR CV LINE INJECTION  05/06/2021   PORTACATH PLACEMENT N/A 04/02/2021   Procedure: INSERTION PORT-A-CATH;  Surgeon: Herbert Pun, MD;  Location: [facility] ORS;  Service: General;  Laterality: N/A;     Allergies: Tape  Medications: Prior to Admission medications   Medication Sig Start Date End Date Taking? Authorizing Provider  acetaminophen (TYLENOL) 500 MG tablet Take 1,000 mg by mouth every 6 (six) hours as needed for moderate pain. Patient not taking: Reported on 04/21/2021    [provider]  amoxicillin-clavulanate (AUGMENTIN) 875-125 MG tablet Take 1 tablet by mouth 2 (two) times daily. 04/21/21   Sindy Guadeloupe, MD  dexamethasone (DECADRON) 4 MG tablet Take 2 tablets (8 mg total) by mouth daily. Take daily for 3 days after chemo. Take with food. 04/02/21   Sindy Guadeloupe, MD  lidocaine-prilocaine (EMLA) cream Apply to affected area once 04/02/21   Sindy Guadeloupe, MD  LORazepam (ATIVAN) 0.5 MG tablet Take 1 tablet (0.5 mg total) by mouth every 6 (six) hours as needed (Nausea or vomiting). Patient not taking: Reported on 04/07/2021 04/02/21   Sindy Guadeloupe, MD  metroNIDAZOLE (METROGEL) 0.75 % gel Apply 1 application topically daily as needed (rosacea). 04/10/19   [provider]  ondansetron (ZOFRAN) 8 MG tablet Take 1 tablet (8 mg total) by mouth 2 (two) times daily as needed. Start on the third day after chemotherapy. Patient not taking: Reported on 04/07/2021 04/02/21   Sindy Guadeloupe, MD  prochlorperazine (COMPAZINE) 10 MG tablet Take 1 tablet (10 mg total) by mouth every 6 (six) hours as needed (Nausea or vomiting). Patient not taking: Reported on 04/07/2021 04/02/21   Sindy Guadeloupe, MD  triamcinolone ointment (KENALOG) 0.5 % Apply 1 application topically 2 (two) times daily.  OVER THE PORT SITE FOR REDNESS 04/21/21   Sindy Guadeloupe, MD     Family History  Problem Relation Age of Onset   Breast cancer Mother 62   Bladder Cancer Father 72   Breast cancer Paternal Aunt 72   Pancreatic cancer Paternal Uncle 14   Pancreatic cancer Maternal Grandfather 31   Cancer Cousin        in ear    Social History   Socioeconomic History   Marital status: Married     Spouse name: Legrand Como   Number of children: Not on file   Years of education: Not on file   Highest education level: Not on file  Occupational History   Not on file  Tobacco Use   Smoking status: Never   Smokeless tobacco: Never  Substance and Sexual Activity   Alcohol use: No   Drug use: No   Sexual activity: Not on file  Other Topics Concern   Not on file  Social History Narrative   Not on file   Social Determinants of Health   Financial Resource Strain: Not on file  Food Insecurity: Not on file  Transportation Needs: Not on file  Physical Activity: Not on file  Stress: Not on file  Social Connections: Not on file    ECOG Status: 1 - Symptomatic but completely ambulatory  Review of Systems: A 12 point ROS discussed and pertinent positives are indicated in the HPI above.  All other systems are negative.  Review of Systems  Vital Signs: BP (!) 110/43    Pulse 83    Temp 98.4 F (36.9 C) (Oral)    Resp 17    Ht 5' 5.5" (1.664 m)    Wt 81.6 kg    SpO2 99%    BMI 29.50 kg/m   Physical Exam  Imaging: NM Bone Scan Whole Body  Result Date: 04/09/2021 CLINICAL DATA:  Breast cancer, staging. EXAM: NUCLEAR MEDICINE WHOLE BODY BONE SCAN TECHNIQUE: Whole body anterior and posterior images were obtained approximately 3 hours after intravenous injection of radiopharmaceutical. RADIOPHARMACEUTICALS:  21.08 mCi Technetium-85mMDP IV COMPARISON:  CT April 01, 2021 FINDINGS: No convincing scintigraphic evidence of abnormal radiotracer uptake to suggest osteoblastic metastatic disease. Focus of abnormal radiotracer activity along the left lateral aspect of the lower sternum as well as along the sternomanubrial joint which appears to reflect productive degenerative change on CT April 01, 2021. Multifocal radiotracer uptake involving the shoulders, sternoclavicular joints, knees, ankles and feet in a pattern most consistent with degenerative arthropathy. Otherwise physiologic  distribution of radiotracer activity. IMPRESSION: No convincing scintigraphic evidence of osteoblastic osseous metastatic disease. Foci of abnormal radiotracer uptake along the left lateral aspect of the lower sternum as well as along the sternomanubrial joint which corresponds with productive degenerative change on CT April 01, 2021. Electronically Signed   By: JDahlia BailiffM.D.   On: 04/09/2021 10:34   IR CV Line Injection  Result Date: 05/06/2021 CLINICAL DATA:  Patient with history of right-sided breast cancer with a left internal jugular Port-A-Cath placed surgically on 04/02/21. Patient presents today for port catheter check given difficulty aspirating with no blood return. EXAM: CONTRAST INJECTION OF PORT A CATH UNDER FLUOROSCOPY CONTRAST:  Omnipaque 350 total of 811mFLUOROSCOPY TIME:  Fluoroscopy Time: 18 seconds  (3.2 mGy). COMPLICATIONS: None immediate. PROCEDURE: Contrast was administered via the indwelling port after it was accessed in a sterile fashion. Fluoroscopic spot images were obtained of the catheter during injection. Heparin was injected into  the catheter and the port was de-accessed and a sterile bandage was applied. IMPRESSION: Successful fluoroscopic guided left sided port a catheter contrast injection, findings reveal the presence of a fibrin sheath and distal catheter tip at level of carina. Would recommend port a catheter exchange for optimal placement of distal catheter tip into the SVC/RA junction and disrupt fibrin sheath to allow for better aspiration. The findings have been discussed today with the ordering provider and the patient will be scheduled for port revision with our service. This exam was performed by Tsosie Billing PA-C, and was supervised and interpreted by Dr. Denna Haggard. Electronically Signed   By: Albin Felling M.D.   On: 05/06/2021 12:03   MM CLIP PLACEMENT LEFT  Result Date: 04/30/2021 CLINICAL DATA:  Confirmation of clip placement after MRI guided core needle  biopsy an indeterminate 1.6 cm focus of non-mass enhancement in the outer LEFT breast at middle to posterior depth. EXAM: 2D and 3D DIAGNOSTIC LEFT MAMMOGRAM POST MRI BIOPSY COMPARISON:  Previous exam(s). FINDINGS: 2D and 3D full field CC and mediolateral images were obtained following MRI guided biopsy of non-mass enhancement in the outer LEFT breast. The dumbbell shaped tissue marking clip is appropriately positioned at the anterior margin of the biopsied enhancement. Interestingly, the S shaped tissue marking clip present on the screening mammogram 02/18/2021 was removed with the biopsy, indicating that the enhancement on MRI correlates with the previously biopsied lesion. Expected post biopsy changes are present without evidence of hematoma. IMPRESSION: 1. Appropriate positioning of the dumbbell shaped biopsy marking clip at the anterior margin of the biopsied enhancement in the outer LEFT breast. 2. The S shaped tissue marking clip present on the screening mammogram 02/18/2021 was removed with the biopsy, indicating that the enhancement on MRI correlates with the previously biopsied lesion in the outer LEFT breast. Final Assessment: Post Procedure Mammograms for Marker Placement Electronically Signed   By: Evangeline Dakin M.D.   On: 04/30/2021 11:04  MR LT BREAST BX W LOC DEV 1ST LESION IMAGE BX SPEC MR GUIDE  Addendum Date: 05/02/2021   ADDENDUM REPORT: 05/02/2021 09:39 ADDENDUM: Pathology revealed FIBROCYSTIC CHANGES WITH CALCIFICATIONS of the LEFT breast, outer, middle depth, (dumbbell clip). This was found to be concordant by Dr. Peggye Fothergill. Pathology results were discussed with the patient by telephone by Stacie Acres, RN Nurse Navigator. The patient reported doing well after the biopsy with tenderness at the site. Post biopsy instructions and care were reviewed and questions were answered. The patient was encouraged to call [facility] for any additional  concerns. The patient has a recent diagnosis of RIGHT breast cancer and should follow her outlined treatment plan. Pathology results reported by Terie Purser, RN on 05/02/2021. Electronically Signed   By: Evangeline Dakin M.D.   On: 05/02/2021 09:39   Result Date: 05/02/2021 CLINICAL DATA:  55 year old with recent biopsy-proven extensive grade 3 invasive mammary carcinoma-no special type and high-grade DCIS involving the RIGHT breast and a biopsy-proven metastatic RIGHT axillary lymph node. Diagnostic MRI demonstrated non-mass enhancement in 3 quadrants of the RIGHT breast (upper outer quadrant, lower outer quadrant, upper inner quadrant) and 6 abnormal RIGHT axillary lymph nodes. There was a similar 1.6 cm focus of non-mass enhancement in the outer LEFT breast at middle to posterior depth which is sampled today. EXAM: MRI GUIDED CORE NEEDLE BIOPSY OF THE LEFT BREAST TECHNIQUE: Multiplanar, multisequence MR imaging of the LEFT breast was performed both before and after administration of intravenous contrast.  CONTRAST:  8 mL Gadavist IV. COMPARISON:  Previous exams. FINDINGS: I met with the patient, and we discussed the procedure of MRI guided biopsy, including risks, benefits, and alternatives. Specifically, we discussed the risks of infection, bleeding, tissue injury, clip migration, and inadequate sampling. Informed, written consent was given. The usual time out protocol was performed immediately prior to the procedure. Lesion quadrant: LOWER OUTER QUADRANT. Using sterile technique with chlorhexidine as skin antisepsis, 1% lidocaine and 1% lidocaine with epinephrine as local anesthetic, using MRI guidance, a 9 gauge Suros vacuum assisted core needle device was used perform biopsy of the non-mass enhancement in the outer LEFT breast using a lateral approach. At the conclusion of the procedure, a dumbbell shaped tissue marker clip was deployed into the biopsy cavity. Follow-up 2-view mammogram was performed and  dictated separately. IMPRESSION: MRI guided biopsy of non-mass enhancement involving the outer LEFT breast at middle to posterior depth. No apparent complications. Electronically Signed: By: Evangeline Dakin M.D. On: 04/30/2021 11:04   Labs:  CBC: Recent Labs    04/07/21 0812 04/21/21 1022 05/05/21 0832  WBC 9.0 8.0 9.6  HGB 11.8* 11.4* 9.5*  HCT 36.5 36.0 29.4*  PLT 191 281 201    COAGS: No results for input(s): INR, APTT in the last 8760 hours.  BMP: Recent Labs    04/01/21 1445 04/07/21 0812 04/21/21 1022 05/05/21 0832  NA  --  137 139 138  K  --  3.7 3.8 3.5  CL  --  104 105 105  CO2  --  '27 29 24  ' GLUCOSE  --  130* 98 125*  BUN  --  '18 18 12  ' CALCIUM  --  9.0 8.9 8.6*  CREATININE 0.90 0.95 0.91 0.90  GFRNONAA  --  >60 >60 >60    LIVER FUNCTION TESTS: Recent Labs    04/07/21 0812 04/21/21 1022 05/05/21 0832  BILITOT 0.5 0.2* 0.4  AST '20 18 23  ' ALT '18 21 12  ' ALKPHOS 59 78 69  PROT 7.3 7.0 6.0*  ALBUMIN 3.8 4.2 3.4*    TUMOR MARKERS: No results for input(s): AFPTM, CEA, CA199, CHROMGRNA in the last 8760 hours.  Assessment and Plan:  Emma Cuevas is a 55 y.o. female with recent diagnosis of right-sided breast cancer, post surgical placement of a left jugular approach portacatheter on 04/02/2021, now with catheter malfunction secondary to development of a significant fibrin sheath about its tip.  Patient presents today for definitive portacatheter revision.  Patient is currently without complaint.  Specifically, no chest pain, shortness of breath, fever or chills.  Risks and benefits of image guided port-a-catheter placement was discussed with the patient including, but not limited to bleeding, infection, pneumothorax, or fibrin sheath development and need for additional procedures.  All of the patient's questions were answered, patient is agreeable to proceed. Consent signed and in chart.   Thank you for this interesting consult.  I greatly  enjoyed meeting Emma Cuevas and look forward to participating in their care.  A copy of this report was sent to the requesting provider on this date.  Electronically Signed: Sandi Mariscal, MD 05/08/2021, 8:53 AM   I spent a total of 30 Minutes in face to face in clinical consultation, greater than 50% of which was counseling/coordinating care for port revision.

## 2021-05-08 NOTE — Procedures (Signed)
Pre Procedure Dx: Malfunctioning port Post Procedural Dx: Same  Successful revision of malpositioning left IJ approach port with tip now within the superior caval atrial junction. The catheter is ready for immediate use.  Estimated Blood Loss: Minimal  Complications: None immediate.  Ronny Bacon, MD Pager #: [phone]

## 2021-05-09 ENCOUNTER — Inpatient Hospital Stay: Payer: Managed Care, Other (non HMO)

## 2021-05-09 VITALS — BP 132/62 | HR 76 | Resp 18 | Wt 183.0 lb

## 2021-05-09 DIAGNOSIS — Z5111 Encounter for antineoplastic chemotherapy: Secondary | ICD-10-CM | POA: Diagnosis not present

## 2021-05-09 DIAGNOSIS — Z170 Estrogen receptor positive status [ER+]: Secondary | ICD-10-CM

## 2021-05-09 DIAGNOSIS — C50911 Malignant neoplasm of unspecified site of right female breast: Secondary | ICD-10-CM

## 2021-05-09 LAB — COMPREHENSIVE METABOLIC PANEL
ALT: 15 U/L (ref 0–44)
AST: 28 U/L (ref 15–41)
Albumin: 3.5 g/dL (ref 3.5–5.0)
Alkaline Phosphatase: 63 U/L (ref 38–126)
Anion gap: 8 (ref 5–15)
BUN: 18 mg/dL (ref 6–20)
CO2: 22 mmol/L (ref 22–32)
Calcium: 8.6 mg/dL — ABNORMAL LOW (ref 8.9–10.3)
Chloride: 106 mmol/L (ref 98–111)
Creatinine, Ser: 0.87 mg/dL (ref 0.44–1.00)
GFR, Estimated: >60 mL/min (ref >60)
Glucose, Bld: 145 mg/dL — ABNORMAL HIGH (ref 70–99)
Potassium: 3.5 mmol/L (ref 3.5–5.1)
Sodium: 136 mmol/L (ref 135–145)
Total Bilirubin: 0.4 mg/dL (ref 0.3–1.2)
Total Protein: 6.2 g/dL — ABNORMAL LOW (ref 6.5–8.1)

## 2021-05-09 LAB — CBC WITH DIFFERENTIAL/PLATELET
Abs Immature Granulocytes: 0.28 10*3/uL — ABNORMAL HIGH (ref 0.00–0.07)
Basophils Absolute: 0.1 10*3/uL (ref 0.0–0.1)
Basophils Relative: 1 %
Eosinophils Absolute: 0 10*3/uL (ref 0.0–0.5)
Eosinophils Relative: 0 %
HCT: 31.1 % — ABNORMAL LOW (ref 36.0–46.0)
Hemoglobin: 10 g/dL — ABNORMAL LOW (ref 12.0–15.0)
Immature Granulocytes: 3 %
Lymphocytes Relative: 14 %
Lymphs Abs: 1.3 10*3/uL (ref 0.7–4.0)
MCH: 27.9 pg (ref 26.0–34.0)
MCHC: 32.2 g/dL (ref 30.0–36.0)
MCV: 86.6 fL (ref 80.0–100.0)
Monocytes Absolute: 0.9 10*3/uL (ref 0.1–1.0)
Monocytes Relative: 10 %
Neutro Abs: 6.7 10*3/uL (ref 1.7–7.7)
Neutrophils Relative %: 72 %
Platelets: 301 10*3/uL (ref 150–400)
RBC: 3.59 MIL/uL — ABNORMAL LOW (ref 3.87–5.11)
RDW: 14.7 % (ref 11.5–15.5)
WBC: 9.2 10*3/uL (ref 4.0–10.5)
nRBC: 0.3 % — ABNORMAL HIGH (ref 0.0–0.2)

## 2021-05-09 MED ORDER — HEPARIN SOD (PORK) LOCK FLUSH 100 UNIT/ML IV SOLN
INTRAVENOUS | Status: AC
  Administered 2021-05-09: 500 [IU]
  Filled 2021-05-09: qty 5

## 2021-05-09 MED ORDER — SODIUM CHLORIDE 0.9 % IV SOLN
1160.0000 mg | Freq: Once | INTRAVENOUS | Status: AC
  Administered 2021-05-09: 1160 mg via INTRAVENOUS
  Filled 2021-05-09: qty 50

## 2021-05-09 MED ORDER — SODIUM CHLORIDE 0.9 % IV SOLN
Freq: Once | INTRAVENOUS | Status: AC
  Filled 2021-05-09: qty 250

## 2021-05-09 MED ORDER — SODIUM CHLORIDE 0.9 % IV SOLN
10.0000 mg | Freq: Once | INTRAVENOUS | Status: AC
  Administered 2021-05-09: 10 mg via INTRAVENOUS
  Filled 2021-05-09: qty 10

## 2021-05-09 MED ORDER — SODIUM CHLORIDE 0.9 % IV SOLN
150.0000 mg | Freq: Once | INTRAVENOUS | Status: AC
  Administered 2021-05-09: 150 mg via INTRAVENOUS
  Filled 2021-05-09: qty 150

## 2021-05-09 MED ORDER — HEPARIN SOD (PORK) LOCK FLUSH 100 UNIT/ML IV SOLN
500.0000 [IU] | Freq: Once | INTRAVENOUS | Status: AC | PRN
  Filled 2021-05-09: qty 5

## 2021-05-09 MED ORDER — PALONOSETRON HCL INJECTION 0.25 MG/5ML
0.2500 mg | Freq: Once | INTRAVENOUS | Status: AC
  Administered 2021-05-09: 0.25 mg via INTRAVENOUS
  Filled 2021-05-09: qty 5

## 2021-05-09 MED ORDER — DOXORUBICIN HCL CHEMO IV INJECTION 2 MG/ML
116.0000 mg | Freq: Once | INTRAVENOUS | Status: AC
  Administered 2021-05-09: 116 mg via INTRAVENOUS
  Filled 2021-05-09: qty 50

## 2021-05-13 ENCOUNTER — Encounter: Payer: Self-pay | Admitting: Nurse Practitioner

## 2021-05-13 ENCOUNTER — Other Ambulatory Visit: Payer: Self-pay | Admitting: Oncology

## 2021-05-16 ENCOUNTER — Encounter: Payer: Self-pay | Admitting: Oncology

## 2021-05-20 ENCOUNTER — Other Ambulatory Visit: Payer: Managed Care, Other (non HMO)

## 2021-05-20 ENCOUNTER — Encounter: Payer: Self-pay | Admitting: Nurse Practitioner

## 2021-05-20 ENCOUNTER — Ambulatory Visit: Payer: Managed Care, Other (non HMO)

## 2021-05-20 ENCOUNTER — Telehealth: Payer: Self-pay

## 2021-05-20 ENCOUNTER — Telehealth: Payer: Self-pay | Admitting: Oncology

## 2021-05-20 ENCOUNTER — Ambulatory Visit: Payer: Managed Care, Other (non HMO) | Admitting: Nurse Practitioner

## 2021-05-20 NOTE — Telephone Encounter (Signed)
Pt called stating that she just had a port put in and she is having some swelling around port. Wants to know if she should come in to see someone. Call back at [phone]

## 2021-05-20 NOTE — Telephone Encounter (Signed)
Pt in clinic for nursing to assess port site. Port site appears slightly swollen in the area above reservoir, including insertion site. Insertion site slightly red. Pt reports tenderness to touch. Denies trauma to the area. Area checked by Rulon Abide, NP. Sonia Baller did not feel that site appeared infected. She advised pt to continue to monitor area, but call back if no better, or worse over the next 1-2 days.

## 2021-05-21 ENCOUNTER — Other Ambulatory Visit: Payer: Managed Care, Other (non HMO)

## 2021-05-21 ENCOUNTER — Ambulatory Visit: Payer: Managed Care, Other (non HMO)

## 2021-05-21 ENCOUNTER — Ambulatory Visit: Payer: Managed Care, Other (non HMO) | Admitting: Oncology

## 2021-05-23 ENCOUNTER — Encounter: Payer: Self-pay | Admitting: Nurse Practitioner

## 2021-05-23 ENCOUNTER — Inpatient Hospital Stay: Payer: Managed Care, Other (non HMO)

## 2021-05-23 ENCOUNTER — Other Ambulatory Visit: Payer: Self-pay

## 2021-05-23 ENCOUNTER — Inpatient Hospital Stay (HOSPITAL_BASED_OUTPATIENT_CLINIC_OR_DEPARTMENT_OTHER): Payer: Managed Care, Other (non HMO) | Admitting: Nurse Practitioner

## 2021-05-23 VITALS — BP 100/42 | HR 98 | Temp 97.8°F | Resp 19 | Wt 181.4 lb

## 2021-05-23 VITALS — BP 110/59 | HR 80

## 2021-05-23 DIAGNOSIS — Z170 Estrogen receptor positive status [ER+]: Secondary | ICD-10-CM

## 2021-05-23 DIAGNOSIS — C50911 Malignant neoplasm of unspecified site of right female breast: Secondary | ICD-10-CM

## 2021-05-23 DIAGNOSIS — D649 Anemia, unspecified: Secondary | ICD-10-CM | POA: Diagnosis not present

## 2021-05-23 DIAGNOSIS — Z5111 Encounter for antineoplastic chemotherapy: Secondary | ICD-10-CM | POA: Diagnosis not present

## 2021-05-23 LAB — COMPREHENSIVE METABOLIC PANEL
ALT: 13 U/L (ref 0–44)
AST: 26 U/L (ref 15–41)
Albumin: 3.3 g/dL — ABNORMAL LOW (ref 3.5–5.0)
Alkaline Phosphatase: 72 U/L (ref 38–126)
Anion gap: 9 (ref 5–15)
BUN: 11 mg/dL (ref 6–20)
CO2: 23 mmol/L (ref 22–32)
Calcium: 8.7 mg/dL — ABNORMAL LOW (ref 8.9–10.3)
Chloride: 103 mmol/L (ref 98–111)
Creatinine, Ser: 0.85 mg/dL (ref 0.44–1.00)
GFR, Estimated: >60 mL/min (ref >60)
Glucose, Bld: 143 mg/dL — ABNORMAL HIGH (ref 70–99)
Potassium: 3.6 mmol/L (ref 3.5–5.1)
Sodium: 135 mmol/L (ref 135–145)
Total Bilirubin: 0.2 mg/dL — ABNORMAL LOW (ref 0.3–1.2)
Total Protein: 6.2 g/dL — ABNORMAL LOW (ref 6.5–8.1)

## 2021-05-23 LAB — CBC WITH DIFFERENTIAL/PLATELET
Abs Immature Granulocytes: 0.25 10*3/uL — ABNORMAL HIGH (ref 0.00–0.07)
Basophils Absolute: 0.1 10*3/uL (ref 0.0–0.1)
Basophils Relative: 1 %
Eosinophils Absolute: 0.1 10*3/uL (ref 0.0–0.5)
Eosinophils Relative: 1 %
HCT: 28.3 % — ABNORMAL LOW (ref 36.0–46.0)
Hemoglobin: 9.1 g/dL — ABNORMAL LOW (ref 12.0–15.0)
Immature Granulocytes: 3 %
Lymphocytes Relative: 8 %
Lymphs Abs: 0.6 10*3/uL — ABNORMAL LOW (ref 0.7–4.0)
MCH: 28.1 pg (ref 26.0–34.0)
MCHC: 32.2 g/dL (ref 30.0–36.0)
MCV: 87.3 fL (ref 80.0–100.0)
Monocytes Absolute: 0.9 10*3/uL (ref 0.1–1.0)
Monocytes Relative: 12 %
Neutro Abs: 5.6 10*3/uL (ref 1.7–7.7)
Neutrophils Relative %: 75 %
Platelets: 211 10*3/uL (ref 150–400)
RBC: 3.24 MIL/uL — ABNORMAL LOW (ref 3.87–5.11)
RDW: 16.5 % — ABNORMAL HIGH (ref 11.5–15.5)
WBC: 7.5 10*3/uL (ref 4.0–10.5)
nRBC: 0.4 % — ABNORMAL HIGH (ref 0.0–0.2)

## 2021-05-23 MED ORDER — PALONOSETRON HCL INJECTION 0.25 MG/5ML
0.2500 mg | Freq: Once | INTRAVENOUS | Status: AC
  Administered 2021-05-23: 11:00:00 0.25 mg via INTRAVENOUS
  Filled 2021-05-23: qty 5

## 2021-05-23 MED ORDER — SODIUM CHLORIDE 0.9 % IV SOLN
1160.0000 mg | Freq: Once | INTRAVENOUS | Status: AC
  Administered 2021-05-23: 12:00:00 1160 mg via INTRAVENOUS
  Filled 2021-05-23: qty 50

## 2021-05-23 MED ORDER — DOXORUBICIN HCL CHEMO IV INJECTION 2 MG/ML
116.0000 mg | Freq: Once | INTRAVENOUS | Status: AC
  Administered 2021-05-23: 12:00:00 116 mg via INTRAVENOUS
  Filled 2021-05-23: qty 58

## 2021-05-23 MED ORDER — SODIUM CHLORIDE 0.9 % IV SOLN
Freq: Once | INTRAVENOUS | Status: AC
  Filled 2021-05-23: qty 250

## 2021-05-23 MED ORDER — SODIUM CHLORIDE 0.9 % IV SOLN
150.0000 mg | Freq: Once | INTRAVENOUS | Status: AC
  Administered 2021-05-23: 11:00:00 150 mg via INTRAVENOUS
  Filled 2021-05-23: qty 5

## 2021-05-23 MED ORDER — SODIUM CHLORIDE 0.9 % IV SOLN
10.0000 mg | Freq: Once | INTRAVENOUS | Status: AC
  Administered 2021-05-23: 11:00:00 10 mg via INTRAVENOUS
  Filled 2021-05-23: qty 1

## 2021-05-23 NOTE — Progress Notes (Signed)
Hematology/Oncology Consult Note [facility]  [phone] [phone]  Patient Care Team: Derinda Late, MD as PCP - General (Family Medicine) Christene Lye, MD (General Surgery) Rosina Lowenstein, MD (Obstetrics and Gynecology)   Name of the patient: Emma Cuevas  [id number]  04/08/1966   Date of visit: 05/23/21  Diagnosis- clinical prognostic stage IIIa right breast cancer T3 N1 M0 ER/PR positive HER2 negative    Chief complaint/ Reason for visit-on treatment assessment prior to cycle 4 of neoadjuvant dose dense AC chemotherapy  Heme/Onc history:  Patient is a 55 year old female who underwent a routine bilateral screening mammogram in September 2022 which showed calcifications in the right breast and prominent right axillary lymph node concerning for malignancy.  This was followed by diagnostic mammogram and ultrasound.  Mammogram showed extensive suspicious pleomorphic calcifications measuring up to 6.8 cm.  2 abnormal lymph nodes were seen in the right axilla on ultrasound as well.  Both the calcifications and the lymph node was biopsied.  Anterior and posterior end of the calcifications was consistent with invasive mammary carcinoma grade 3.  Lymph node was positive for metastatic carcinoma as well.  ER 91 to 100% positive PR 41 to 50% positive and HER2 negative.  Ki-67 40%   Menarche at the age of 14.  She is G2, P2 L2.  Age at first birth 36.  She used birth control pills for about 8 to 10 years.  She had a hysterectomy at the age of 71 but ovaries are still in situ.  Family history significant for breast cancer in her mother and paternal aunt.  Father with metastatic bladder cancer and maternal grandfather with pancreatic cancer.  Patient had BRCA 1 and 2 testing done in the past and was reportedly negative.   MRI bilateral breasts showed non-mass enhancement involving the upper outer lower outer and upper inner quadrants finding  10.9 x 8.4 x 6.8 cm.  Non-mass enhancement in the left breast spanning 1.6 x 1.3 x 0.8 cm.  6 metastatic right axillary lymph nodes.   CT chest abdomen and pelvis with contrast showed mild right axillary adenopathy compatible with metastatic disease but no evidence of distant metastatic disease.  5 mm left lower lobe nodule.  Bone scan was negative for metastatic disease.    Interval history-patient returns to clinic for further evaluation and consideration of cycle 3 of AC chemotherapy.  Cycle 3 was delayed due to sluggish blood flow through patient's Port-A-Cath.  She underwent evaluation of port which showed significant fibrin sheath at the tip.  This was followed by Port-A-Cath revision.  Ultimately she received cycle 3 on 05/09/2021.  Continues to tolerate treatment well.  Complains of some dizziness and general malaise today  ECOG PS- 0 Pain scale- 0   Review of systems- Review of Systems  Constitutional:  Negative for chills, fever, malaise/fatigue and weight loss.  HENT:  Negative for congestion, ear discharge and nosebleeds.   Eyes:  Negative for blurred vision.  Respiratory:  Negative for cough, hemoptysis, sputum production, shortness of breath and wheezing.   Cardiovascular:  Negative for chest pain, palpitations, orthopnea and claudication.  Gastrointestinal:  Negative for abdominal pain, blood in stool, constipation, diarrhea, heartburn, melena, nausea and vomiting.  Genitourinary:  Negative for dysuria, flank pain, frequency, hematuria and urgency.  Musculoskeletal:  Negative for back pain, joint pain and myalgias.  Skin:  Positive for rash.  Neurological:  Negative for dizziness, tingling, focal weakness, seizures, weakness and headaches.  Endo/Heme/Allergies:  Does not bruise/bleed easily.  Psychiatric/Behavioral:  Negative for depression and suicidal ideas. The patient does not have insomnia.      Allergies  Allergen Reactions   Tape     Redness and burning     Past  Medical History:  Diagnosis Date   Anemia    Benign neoplasm of breast 2013   left breast   BRCA negative 2015   Cancer ([place])    Family history of bladder cancer    Family history of breast cancer    Family history of malignant neoplasm of breast 2013   Family history of pancreatic cancer    Screening for obesity      Past Surgical History:  Procedure Laterality Date   ABDOMINAL HYSTERECTOMY  2011   partial   BREAST BIOPSY Left 2013   BREAST BIOPSY Right 03/14/2021   Stereo bx-anterior calcs, "coil" clip-path pending   BREAST BIOPSY Right 03/14/2021   stereo bx-calcs, "Ribbon" clip-path pending   breast biopsy Right 03/14/2021   [place] Bx, Axilla, path pending   BREAST BIOPSY Left 04/30/2021   BREAST SURGERY Left 10/16/2011   left breast finesse biopsy   COLONOSCOPY WITH PROPOFOL N/A 12/05/2014   Procedure: COLONOSCOPY WITH PROPOFOL;  Surgeon: Christene Lye, MD;  Location: [facility] ENDOSCOPY;  Service: Endoscopy;  Laterality: N/A;   DILATION AND CURETTAGE OF UTERUS     IR CV LINE INJECTION  05/06/2021   IR IMAGING GUIDED PORT INSERTION  05/08/2021   PORTACATH PLACEMENT N/A 04/02/2021   Procedure: INSERTION PORT-A-CATH;  Surgeon: Herbert Pun, MD;  Location: [facility] ORS;  Service: General;  Laterality: N/A;    Social History   Socioeconomic History   Marital status: Married    Spouse name: Legrand Como   Number of children: Not on file   Years of education: Not on file   Highest education level: Not on file  Occupational History   Not on file  Tobacco Use   Smoking status: Never   Smokeless tobacco: Never  Substance and Sexual Activity   Alcohol use: No   Drug use: No   Sexual activity: Not on file  Other Topics Concern   Not on file  Social History Narrative   Not on file   Social Determinants of Health   Financial Resource Strain: Not on file  Food Insecurity: Not on file  Transportation Needs: Not on file  Physical Activity: Not on file  Stress:  Not on file  Social Connections: Not on file  Intimate Partner Violence: Not on file    Family History  Problem Relation Age of Onset   Breast cancer Mother 38   Bladder Cancer Father 22   Breast cancer Paternal Aunt 24   Pancreatic cancer Paternal Uncle 48   Pancreatic cancer Maternal Grandfather 73   Cancer Cousin        in ear     Current Outpatient Medications:    acetaminophen (TYLENOL) 500 MG tablet, Take 1,000 mg by mouth every 6 (six) hours as needed for moderate pain., Disp: , Rfl:    dexamethasone (DECADRON) 4 MG tablet, Take 2 tablets (8 mg total) by mouth daily. Take daily for 3 days after chemo. Take with food., Disp: 30 tablet, Rfl: 1   lidocaine-prilocaine (EMLA) cream, Apply to affected area once, Disp: 30 g, Rfl: 3   metroNIDAZOLE (METROGEL) 0.75 % gel, Apply 1 application topically daily as needed (rosacea)., Disp: , Rfl:    ZIEXTENZO 6 MG/0.6ML injection, INJECT THE CONTENTS OF  ONE SYRINGE (6 MG) UNDER THE SKIN 24 TO 72 HOURS AFTER CHEMO EVERY 14 DAYS, Disp: 0.6 mL, Rfl: 1   amoxicillin-clavulanate (AUGMENTIN) 875-125 MG tablet, Take 1 tablet by mouth 2 (two) times daily. (Patient not taking: Reported on 05/23/2021), Disp: 10 tablet, Rfl: 0   LORazepam (ATIVAN) 0.5 MG tablet, Take 1 tablet (0.5 mg total) by mouth every 6 (six) hours as needed (Nausea or vomiting). (Patient not taking: Reported on 04/07/2021), Disp: 30 tablet, Rfl: 0   ondansetron (ZOFRAN) 8 MG tablet, Take 1 tablet (8 mg total) by mouth 2 (two) times daily as needed. Start on the third day after chemotherapy. (Patient not taking: Reported on 04/07/2021), Disp: 30 tablet, Rfl: 1   prochlorperazine (COMPAZINE) 10 MG tablet, Take 1 tablet (10 mg total) by mouth every 6 (six) hours as needed (Nausea or vomiting). (Patient not taking: Reported on 04/07/2021), Disp: 30 tablet, Rfl: 1   triamcinolone ointment (KENALOG) 0.5 %, Apply 1 application topically 2 (two) times daily. OVER THE PORT SITE FOR REDNESS  (Patient not taking: Reported on 05/23/2021), Disp: 30 g, Rfl: 0  Physical exam:  Vitals:   05/23/21 0853  BP: (!) 100/42  Pulse: 98  Resp: 19  Temp: 97.8 F (36.6 C)  SpO2: 98%  Weight: 181 lb 6.4 oz (82.3 kg)   Physical Exam Constitutional:      General: She is not in acute distress. Cardiovascular:     Rate and Rhythm: Normal rate and regular rhythm.     Heart sounds: Normal heart sounds.  Pulmonary:     Effort: Pulmonary effort is normal.     Breath sounds: Normal breath sounds.  Abdominal:     General: Bowel sounds are normal.     Palpations: Abdomen is soft.  Skin:    General: Skin is warm and dry.  Neurological:     Mental Status: She is alert and oriented to person, place, and time.    CMP Latest Ref Rng & Units 05/23/2021  Glucose 70 - 99 mg/dL 143(H)  BUN 6 - 20 mg/dL 11  Creatinine 0.44 - 1.00 mg/dL 0.85  Sodium 135 - 145 mmol/L 135  Potassium 3.5 - 5.1 mmol/L 3.6  Chloride 98 - 111 mmol/L 103  CO2 22 - 32 mmol/L 23  Calcium 8.9 - 10.3 mg/dL 8.7(L)  Total Protein 6.5 - 8.1 g/dL 6.2(L)  Total Bilirubin 0.3 - 1.2 mg/dL 0.2(L)  Alkaline Phos 38 - 126 U/L 72  AST 15 - 41 U/L 26  ALT 0 - 44 U/L 13   CBC Latest Ref Rng & Units 05/23/2021  WBC 4.0 - 10.5 K/uL 7.5  Hemoglobin 12.0 - 15.0 g/dL 9.1(L)  Hematocrit 36.0 - 46.0 % 28.3(L)  Platelets 150 - 400 K/uL 211   IR CV Line Injection  Result Date: 05/06/2021 CLINICAL DATA:  Patient with history of right-sided breast cancer with a left internal jugular Port-A-Cath placed surgically on 04/02/21. Patient presents today for port catheter check given difficulty aspirating with no blood return. EXAM: CONTRAST INJECTION OF PORT A CATH UNDER FLUOROSCOPY CONTRAST:  Omnipaque 350 total of 22m FLUOROSCOPY TIME:  Fluoroscopy Time: 18 seconds  (3.2 mGy). COMPLICATIONS: None immediate. PROCEDURE: Contrast was administered via the indwelling port after it was accessed in a sterile fashion. Fluoroscopic spot images were  obtained of the catheter during injection. Heparin was injected into the catheter and the port was de-accessed and a sterile bandage was applied. IMPRESSION: Successful fluoroscopic guided left sided port a catheter  contrast injection, findings reveal the presence of a fibrin sheath and distal catheter tip at level of carina. Would recommend port a catheter exchange for optimal placement of distal catheter tip into the SVC/RA junction and disrupt fibrin sheath to allow for better aspiration. The findings have been discussed today with the ordering provider and the patient will be scheduled for port revision with our service. This exam was performed by Tsosie Billing PA-C, and was supervised and interpreted by Dr. Denna Haggard. Electronically Signed   By: Albin Felling M.D.   On: 05/06/2021 12:03   MM CLIP PLACEMENT LEFT  Result Date: 04/30/2021 CLINICAL DATA:  Confirmation of clip placement after MRI guided core needle biopsy an indeterminate 1.6 cm focus of non-mass enhancement in the outer LEFT breast at middle to posterior depth. EXAM: 2D and 3D DIAGNOSTIC LEFT MAMMOGRAM POST MRI BIOPSY COMPARISON:  Previous exam(s). FINDINGS: 2D and 3D full field CC and mediolateral images were obtained following MRI guided biopsy of non-mass enhancement in the outer LEFT breast. The dumbbell shaped tissue marking clip is appropriately positioned at the anterior margin of the biopsied enhancement. Interestingly, the S shaped tissue marking clip present on the screening mammogram 02/18/2021 was removed with the biopsy, indicating that the enhancement on MRI correlates with the previously biopsied lesion. Expected post biopsy changes are present without evidence of hematoma. IMPRESSION: 1. Appropriate positioning of the dumbbell shaped biopsy marking clip at the anterior margin of the biopsied enhancement in the outer LEFT breast. 2. The S shaped tissue marking clip present on the screening mammogram 02/18/2021 was removed with the  biopsy, indicating that the enhancement on MRI correlates with the previously biopsied lesion in the outer LEFT breast. Final Assessment: Post Procedure Mammograms for Marker Placement Electronically Signed   By: Evangeline Dakin M.D.   On: 04/30/2021 11:04  MR LT BREAST BX W LOC DEV 1ST LESION IMAGE BX SPEC MR GUIDE  Addendum Date: 05/02/2021   ADDENDUM REPORT: 05/02/2021 09:39 ADDENDUM: Pathology revealed FIBROCYSTIC CHANGES WITH CALCIFICATIONS of the LEFT breast, outer, middle depth, (dumbbell clip). This was found to be concordant by Dr. Peggye Fothergill. Pathology results were discussed with the patient by telephone by Stacie Acres, RN Nurse Navigator. The patient reported doing well after the biopsy with tenderness at the site. Post biopsy instructions and care were reviewed and questions were answered. The patient was encouraged to call [facility] for any additional concerns. The patient has a recent diagnosis of RIGHT breast cancer and should follow her outlined treatment plan. Pathology results reported by Terie Purser, RN on 05/02/2021. Electronically Signed   By: Evangeline Dakin M.D.   On: 05/02/2021 09:39   Result Date: 05/02/2021 CLINICAL DATA:  55 year old with recent biopsy-proven extensive grade 3 invasive mammary carcinoma-no special type and high-grade DCIS involving the RIGHT breast and a biopsy-proven metastatic RIGHT axillary lymph node. Diagnostic MRI demonstrated non-mass enhancement in 3 quadrants of the RIGHT breast (upper outer quadrant, lower outer quadrant, upper inner quadrant) and 6 abnormal RIGHT axillary lymph nodes. There was a similar 1.6 cm focus of non-mass enhancement in the outer LEFT breast at middle to posterior depth which is sampled today. EXAM: MRI GUIDED CORE NEEDLE BIOPSY OF THE LEFT BREAST TECHNIQUE: Multiplanar, multisequence MR imaging of the LEFT breast was performed both before and after administration of intravenous contrast.  CONTRAST:  8 mL Gadavist IV. COMPARISON:  Previous exams. FINDINGS: I met with the patient, and we discussed the procedure of  MRI guided biopsy, including risks, benefits, and alternatives. Specifically, we discussed the risks of infection, bleeding, tissue injury, clip migration, and inadequate sampling. Informed, written consent was given. The usual time out protocol was performed immediately prior to the procedure. Lesion quadrant: LOWER OUTER QUADRANT. Using sterile technique with chlorhexidine as skin antisepsis, 1% lidocaine and 1% lidocaine with epinephrine as local anesthetic, using MRI guidance, a 9 gauge Suros vacuum assisted core needle device was used perform biopsy of the non-mass enhancement in the outer LEFT breast using a lateral approach. At the conclusion of the procedure, a dumbbell shaped tissue marker clip was deployed into the biopsy cavity. Follow-up 2-view mammogram was performed and dictated separately. IMPRESSION: MRI guided biopsy of non-mass enhancement involving the outer LEFT breast at middle to posterior depth. No apparent complications. Electronically Signed: By: Evangeline Dakin M.D. On: 04/30/2021 11:04  IR IMAGING GUIDED PORT INSERTION  Result Date: 05/08/2021 INDICATION: History of right-sided breast cancer, now with malfunctioning surgically placed port a catheter. Patient presents today for definitive Port a catheter replacement. EXAM: IMPLANTED PORT A CATH PLACEMENT WITH ULTRASOUND AND FLUOROSCOPIC GUIDANCE COMPARISON:  Port injection-05/06/2021 MEDICATIONS: None ANESTHESIA/SEDATION: Moderate (conscious) sedation was employed during this procedure as administered by the Interventional Radiology RN. A total of Versed 2 mg and Fentanyl 100 mcg was administered intravenously. Moderate Sedation Time: 22 minutes. The patient's level of consciousness and vital signs were monitored continuously by radiology nursing throughout the procedure under my direct supervision. CONTRAST:   None FLUOROSCOPY TIME:  24 seconds (2.1 mGy) COMPLICATIONS: None immediate. PROCEDURE: The procedure, risks, benefits, and alternatives were explained to the patient. Questions regarding the procedure were encouraged and answered. The patient understands and consents to the procedure. The left neck and chest were prepped with chlorhexidine in a sterile fashion, and a sterile drape was applied covering the operative field. Maximum barrier sterile technique with sterile gowns and gloves were used for the procedure. A timeout was performed prior to the initiation of the procedure. Local anesthesia was provided with 1% lidocaine with epinephrine. After creating a small venotomy incision, a micropuncture kit was utilized to access the internal jugular vein. Real-time ultrasound guidance was utilized for vascular access including the acquisition of a permanent ultrasound image documenting patency of the accessed vessel. The microwire was utilized to measure appropriate catheter length. Next, the surgically placed port a catheter reservoir was dissected and the surgically placed port a Catheter was removed intact Utilizing the same port pocket, a new single lumen "ISP" sized power injectable port was chosen for placement. The 8 Fr catheter was tunneled from the port pocket site to the venotomy incision. The port was placed in the pocket. The external catheter was trimmed to appropriate length. At the venotomy, an 8 Fr peel-away sheath was placed over a guidewire under fluoroscopic guidance. The catheter was then placed through the sheath and the sheath was removed. Final catheter positioning was confirmed and documented with a fluoroscopic spot radiograph. The port was accessed with a Huber needle, aspirated and flushed with heparinized saline. The venotomy site was closed with an interrupted 4-0 Vicryl suture. The port pocket incision was closed with interrupted 2-0 Vicryl suture. The skin was opposed with a running  subcuticular 4-0 Vicryl suture. Dermabond and Steri-strips were applied to both incisions. Dressings were applied. The patient tolerated the procedure well without immediate post procedural complication. FINDINGS: Preprocedural spot fluoroscopic image demonstrates unchanged positioning of malpositioned port a catheter with end overlying expected location the superior aspect  of the SVC. After definitive Port a catheter replacement, the new port a catheter tip lies within the superior cavoatrial junction. The catheter aspirates and flushes normally and is ready for immediate use. IMPRESSION: Successful revision of left internal jugular approach power injectable Port-A-Cath with tip now appropriately positioned at the level of the superior cavoatrial junction. The catheter is ready for immediate use. Electronically Signed   By: Sandi Mariscal M.D.   On: 05/08/2021 16:51     Assessment and plan- Patient is a 55 y.o. female with newly diagnosed clinical prognostic stage IIIa invasive mammary carcinoma of the right breast cT3 N1 M0 ER/PR positive HER2 negative.  She is here for on treatment assessment prior to cycle 4 of neoadjuvant dose dense AC chemotherapy  Counts okay to proceed with cycle 4 of neoadjuvant dose dense AC chemotherapy. She will continue to receive udenyca at home. She will have ultrasound after this cycle prior to starting taxol. If unrevealing may consider MRI.   Hypotension- plan for liter of IV fluids prior to chemotherapy. If blood pressure normalizes and symptoms improve then we can plan for chemotherapy. Will also schedule her for interval IV fluids.   Port irritation- contact dermatitis secondary to adhesive most likely. Resolved.   Port revision- s/p port revision for fibrin sheath prior to cycle 2. Monitor.   Anemia- check iron studies, ferritin, b12, and folate with next lab draw. Likely chemotherapy induced.   Plan for IV fluids on Tuesday (clinic closed Monday due to holiday).  She will have an ultrasound asap. RTC in 2 weeks for port/labs (cbc, cmp, ferritin, iron studies, folate, b12), Janese Banks, taxol   Visit Diagnosis 1. Encounter for antineoplastic chemotherapy   2. Breast cancer, stage 1, estrogen receptor positive, right ([place])    Beckey Rutter, DNP, AGNP-C [place] at [facility] [phone] (clinic) 05/23/2021

## 2021-05-23 NOTE — Progress Notes (Signed)
Patient states she woke up dizzy and was dizzy when they laid her back for her port flush.

## 2021-05-23 NOTE — Progress Notes (Unsigned)
Ok per Beckey Rutter to proceed with tx after IVF

## 2021-05-27 ENCOUNTER — Inpatient Hospital Stay: Payer: Managed Care, Other (non HMO) | Attending: Oncology

## 2021-05-27 ENCOUNTER — Other Ambulatory Visit: Payer: Self-pay

## 2021-05-27 ENCOUNTER — Encounter: Payer: Self-pay | Admitting: Oncology

## 2021-05-27 VITALS — BP 107/63 | HR 92 | Temp 98.5°F | Resp 16

## 2021-05-27 DIAGNOSIS — Z8052 Family history of malignant neoplasm of bladder: Secondary | ICD-10-CM | POA: Diagnosis not present

## 2021-05-27 DIAGNOSIS — Z170 Estrogen receptor positive status [ER+]: Secondary | ICD-10-CM | POA: Insufficient documentation

## 2021-05-27 DIAGNOSIS — Z79899 Other long term (current) drug therapy: Secondary | ICD-10-CM | POA: Insufficient documentation

## 2021-05-27 DIAGNOSIS — R0981 Nasal congestion: Secondary | ICD-10-CM | POA: Diagnosis not present

## 2021-05-27 DIAGNOSIS — D6481 Anemia due to antineoplastic chemotherapy: Secondary | ICD-10-CM | POA: Diagnosis not present

## 2021-05-27 DIAGNOSIS — R0982 Postnasal drip: Secondary | ICD-10-CM | POA: Diagnosis not present

## 2021-05-27 DIAGNOSIS — J029 Acute pharyngitis, unspecified: Secondary | ICD-10-CM | POA: Insufficient documentation

## 2021-05-27 DIAGNOSIS — Z86018 Personal history of other benign neoplasm: Secondary | ICD-10-CM | POA: Diagnosis not present

## 2021-05-27 DIAGNOSIS — T451X5A Adverse effect of antineoplastic and immunosuppressive drugs, initial encounter: Secondary | ICD-10-CM | POA: Insufficient documentation

## 2021-05-27 DIAGNOSIS — Z800 Family history of malignant neoplasm of digestive organs: Secondary | ICD-10-CM | POA: Diagnosis not present

## 2021-05-27 DIAGNOSIS — C50911 Malignant neoplasm of unspecified site of right female breast: Secondary | ICD-10-CM | POA: Diagnosis present

## 2021-05-27 DIAGNOSIS — Z803 Family history of malignant neoplasm of breast: Secondary | ICD-10-CM | POA: Diagnosis not present

## 2021-05-27 DIAGNOSIS — R059 Cough, unspecified: Secondary | ICD-10-CM | POA: Insufficient documentation

## 2021-05-27 DIAGNOSIS — R5383 Other fatigue: Secondary | ICD-10-CM | POA: Insufficient documentation

## 2021-05-27 DIAGNOSIS — Z95828 Presence of other vascular implants and grafts: Secondary | ICD-10-CM

## 2021-05-27 DIAGNOSIS — Z9221 Personal history of antineoplastic chemotherapy: Secondary | ICD-10-CM | POA: Diagnosis not present

## 2021-05-27 DIAGNOSIS — Z5111 Encounter for antineoplastic chemotherapy: Secondary | ICD-10-CM | POA: Insufficient documentation

## 2021-05-27 DIAGNOSIS — Z888 Allergy status to other drugs, medicaments and biological substances status: Secondary | ICD-10-CM | POA: Diagnosis not present

## 2021-05-27 DIAGNOSIS — C773 Secondary and unspecified malignant neoplasm of axilla and upper limb lymph nodes: Secondary | ICD-10-CM | POA: Insufficient documentation

## 2021-05-27 DIAGNOSIS — R590 Localized enlarged lymph nodes: Secondary | ICD-10-CM | POA: Diagnosis not present

## 2021-05-27 DIAGNOSIS — J3489 Other specified disorders of nose and nasal sinuses: Secondary | ICD-10-CM | POA: Diagnosis not present

## 2021-05-27 MED ORDER — SODIUM CHLORIDE 0.9 % IV SOLN
INTRAVENOUS | Status: AC
  Filled 2021-05-27 (×2): qty 250

## 2021-05-27 MED ORDER — SODIUM CHLORIDE 0.9% FLUSH
10.0000 mL | Freq: Once | INTRAVENOUS | Status: AC
  Administered 2021-05-27: 10 mL via INTRAVENOUS
  Filled 2021-05-27: qty 10

## 2021-05-27 MED ORDER — HEPARIN SOD (PORK) LOCK FLUSH 100 UNIT/ML IV SOLN
500.0000 [IU] | Freq: Once | INTRAVENOUS | Status: AC
  Administered 2021-05-27: 500 [IU] via INTRAVENOUS
  Filled 2021-05-27: qty 5

## 2021-05-27 NOTE — Progress Notes (Signed)
Pt in [facility]/fluid clinic for IV fluids and BP check. Pt reports that she feels weak and tired. Denies nausea, vomiting, diarrhea. Also reports generalized aches, and attributes those to gcsf injection recently received. BP stable in clinic.

## 2021-05-29 ENCOUNTER — Other Ambulatory Visit: Payer: Self-pay | Admitting: *Deleted

## 2021-05-29 DIAGNOSIS — D649 Anemia, unspecified: Secondary | ICD-10-CM

## 2021-05-30 ENCOUNTER — Telehealth: Payer: Self-pay | Admitting: *Deleted

## 2021-05-30 ENCOUNTER — Encounter: Payer: Self-pay | Admitting: Oncology

## 2021-05-30 NOTE — Telephone Encounter (Signed)
Can you please call her

## 2021-05-30 NOTE — Telephone Encounter (Signed)
Patient called reporting that she awoke this morning with a fever of 100, bad cough  and sore throat real sore. Please advise

## 2021-05-30 NOTE — Telephone Encounter (Signed)
Spoke with patient by phone.  She reports several days of mild cold-like symptoms with runny nose, nasal congestion, postnasal drip, sore throat, and cough.  No sinus pressure or tenderness.  No fever or chills.  No myalgias.  No shortness of breath or wheezing.  Patient took a home COVID test, which was negative.  She says her symptoms are worse when she is lying down but seem to improve during the day.  She feels better now than when she called earlier.  She has not tried any over-the-counter medications.  I provided her with several suggestions for OTC meds including nasal decongestants, guaifenesin, and cough suppressant.  She can also take acetaminophen for fever.  I recommended COVID/flu PCR testing but she declined this.  I encouraged her to call back if symptoms persist or worsen.

## 2021-05-31 ENCOUNTER — Other Ambulatory Visit: Payer: Self-pay | Admitting: Oncology

## 2021-05-31 MED ORDER — AMOXICILLIN-POT CLAVULANATE 875-125 MG PO TABS: 1.0000 | ORAL_TABLET | Freq: Two times a day (BID) | ORAL | 0 refills | Status: DC

## 2021-06-02 ENCOUNTER — Inpatient Hospital Stay: Payer: Managed Care, Other (non HMO)

## 2021-06-02 ENCOUNTER — Ambulatory Visit: Payer: Managed Care, Other (non HMO) | Admitting: Oncology

## 2021-06-02 ENCOUNTER — Other Ambulatory Visit: Payer: Self-pay

## 2021-06-02 ENCOUNTER — Ambulatory Visit
Admission: RE | Admit: 2021-06-02 | Discharge: 2021-06-02 | Disposition: A | Discharge status: Home / Self Care | Payer: Managed Care, Other (non HMO) | Attending: Hospice and Palliative Medicine | Admitting: Hospice and Palliative Medicine

## 2021-06-02 ENCOUNTER — Inpatient Hospital Stay (HOSPITAL_BASED_OUTPATIENT_CLINIC_OR_DEPARTMENT_OTHER): Payer: Managed Care, Other (non HMO) | Admitting: Hospice and Palliative Medicine

## 2021-06-02 ENCOUNTER — Ambulatory Visit: Payer: Managed Care, Other (non HMO)

## 2021-06-02 ENCOUNTER — Ambulatory Visit
Admission: RE | Admit: 2021-06-02 | Discharge: 2021-06-02 | Disposition: A | Discharge status: Home / Self Care | Payer: Managed Care, Other (non HMO) | Source: Ambulatory Visit | Attending: Hospice and Palliative Medicine | Admitting: Hospice and Palliative Medicine

## 2021-06-02 ENCOUNTER — Other Ambulatory Visit: Payer: Managed Care, Other (non HMO)

## 2021-06-02 VITALS — BP 105/53 | HR 98 | Temp 98.8°F | Resp 17

## 2021-06-02 DIAGNOSIS — C50911 Malignant neoplasm of unspecified site of right female breast: Secondary | ICD-10-CM | POA: Diagnosis present

## 2021-06-02 DIAGNOSIS — Z170 Estrogen receptor positive status [ER+]: Secondary | ICD-10-CM | POA: Insufficient documentation

## 2021-06-02 DIAGNOSIS — Z95828 Presence of other vascular implants and grafts: Secondary | ICD-10-CM

## 2021-06-02 DIAGNOSIS — Z5111 Encounter for antineoplastic chemotherapy: Secondary | ICD-10-CM | POA: Diagnosis not present

## 2021-06-02 DIAGNOSIS — J069 Acute upper respiratory infection, unspecified: Secondary | ICD-10-CM

## 2021-06-02 LAB — COMPREHENSIVE METABOLIC PANEL
ALT: 17 U/L (ref 0–44)
AST: 15 U/L (ref 15–41)
Albumin: 3.3 g/dL — ABNORMAL LOW (ref 3.5–5.0)
Alkaline Phosphatase: 61 U/L (ref 38–126)
Anion gap: 7 (ref 5–15)
BUN: 15 mg/dL (ref 6–20)
CO2: 26 mmol/L (ref 22–32)
Calcium: 8.3 mg/dL — ABNORMAL LOW (ref 8.9–10.3)
Chloride: 99 mmol/L (ref 98–111)
Creatinine, Ser: 0.78 mg/dL (ref 0.44–1.00)
GFR, Estimated: >60 mL/min (ref >60)
Glucose, Bld: 104 mg/dL — ABNORMAL HIGH (ref 70–99)
Potassium: 3.2 mmol/L — ABNORMAL LOW (ref 3.5–5.1)
Sodium: 132 mmol/L — ABNORMAL LOW (ref 135–145)
Total Bilirubin: 0.2 mg/dL — ABNORMAL LOW (ref 0.3–1.2)
Total Protein: 6.5 g/dL (ref 6.5–8.1)

## 2021-06-02 LAB — CBC WITH DIFFERENTIAL/PLATELET
Abs Immature Granulocytes: 0.15 10*3/uL — ABNORMAL HIGH (ref 0.00–0.07)
Basophils Absolute: 0.1 10*3/uL (ref 0.0–0.1)
Basophils Relative: 1 %
Eosinophils Absolute: 0 10*3/uL (ref 0.0–0.5)
Eosinophils Relative: 0 %
HCT: 24.5 % — ABNORMAL LOW (ref 36.0–46.0)
Hemoglobin: 8 g/dL — ABNORMAL LOW (ref 12.0–15.0)
Immature Granulocytes: 4 %
Lymphocytes Relative: 17 %
Lymphs Abs: 0.7 10*3/uL (ref 0.7–4.0)
MCH: 28.2 pg (ref 26.0–34.0)
MCHC: 32.7 g/dL (ref 30.0–36.0)
MCV: 86.3 fL (ref 80.0–100.0)
Monocytes Absolute: 0.6 10*3/uL (ref 0.1–1.0)
Monocytes Relative: 15 %
Neutro Abs: 2.5 10*3/uL (ref 1.7–7.7)
Neutrophils Relative %: 63 %
Platelets: 76 10*3/uL — ABNORMAL LOW (ref 150–400)
RBC: 2.84 MIL/uL — ABNORMAL LOW (ref 3.87–5.11)
RDW: 15.7 % — ABNORMAL HIGH (ref 11.5–15.5)
Smear Review: NORMAL
WBC: 4 10*3/uL (ref 4.0–10.5)
nRBC: 0.5 % — ABNORMAL HIGH (ref 0.0–0.2)

## 2021-06-02 LAB — URINALYSIS, COMPLETE (UACMP) WITH MICROSCOPIC
Glucose, UA: NEGATIVE mg/dL
Hgb urine dipstick: NEGATIVE
Ketones, ur: 5 mg/dL — AB
Leukocytes,Ua: NEGATIVE
Nitrite: NEGATIVE
Protein, ur: 100 mg/dL — AB
Specific Gravity, Urine: >1.046 — ABNORMAL HIGH (ref 1.005–1.030)
pH: 5 (ref 5.0–8.0)

## 2021-06-02 IMAGING — CR DG CHEST 2V
1 series · 2 of 2 positions shown · non-contrast
Comparison: [DATE].

CLINICAL DATA: Fever, cough.

EXAM:
CHEST - 2 VIEW

[Series 1: dg chest 2 view · 0.14mm/px · 2 of 2 slices shown]
[im 1/2]
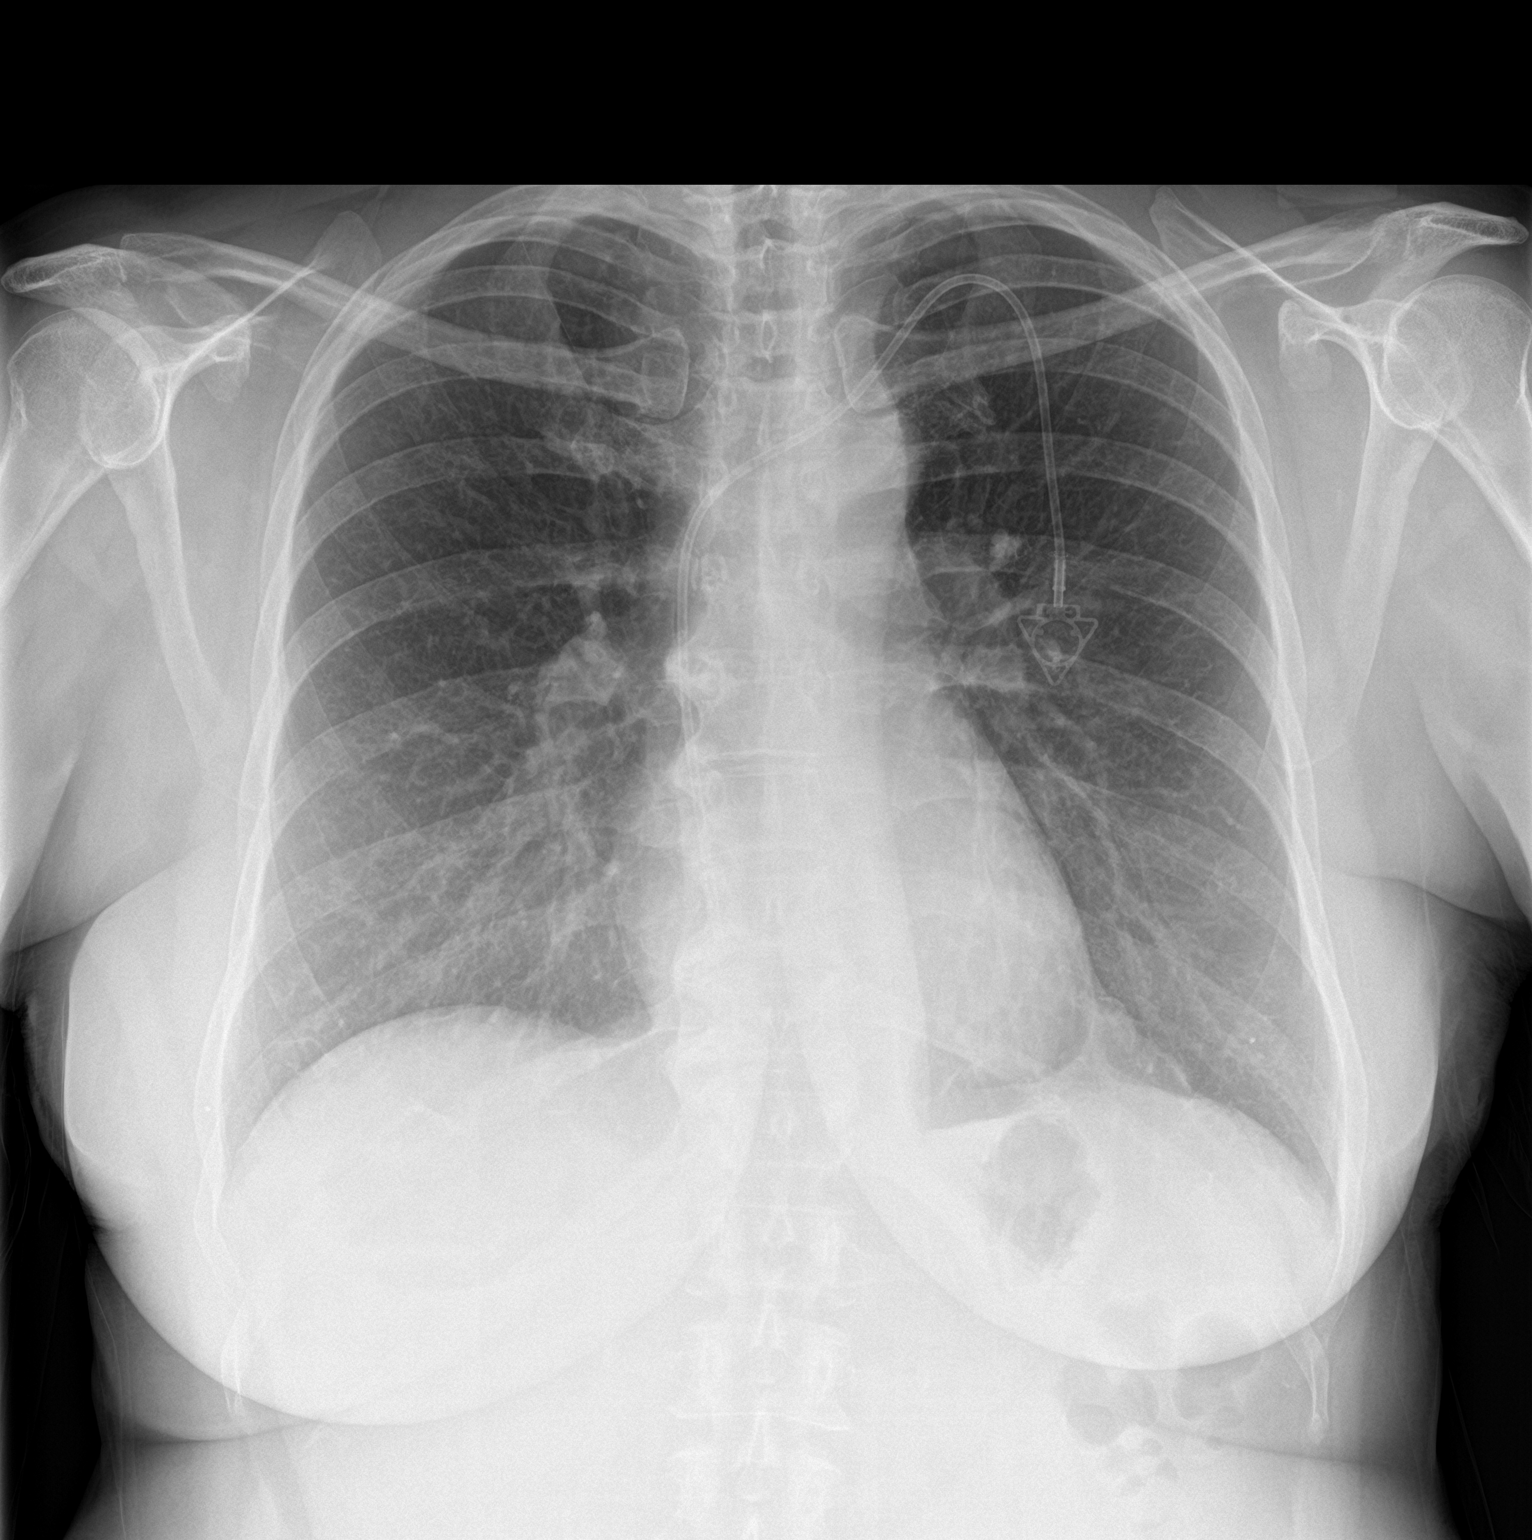
[im 2/2]
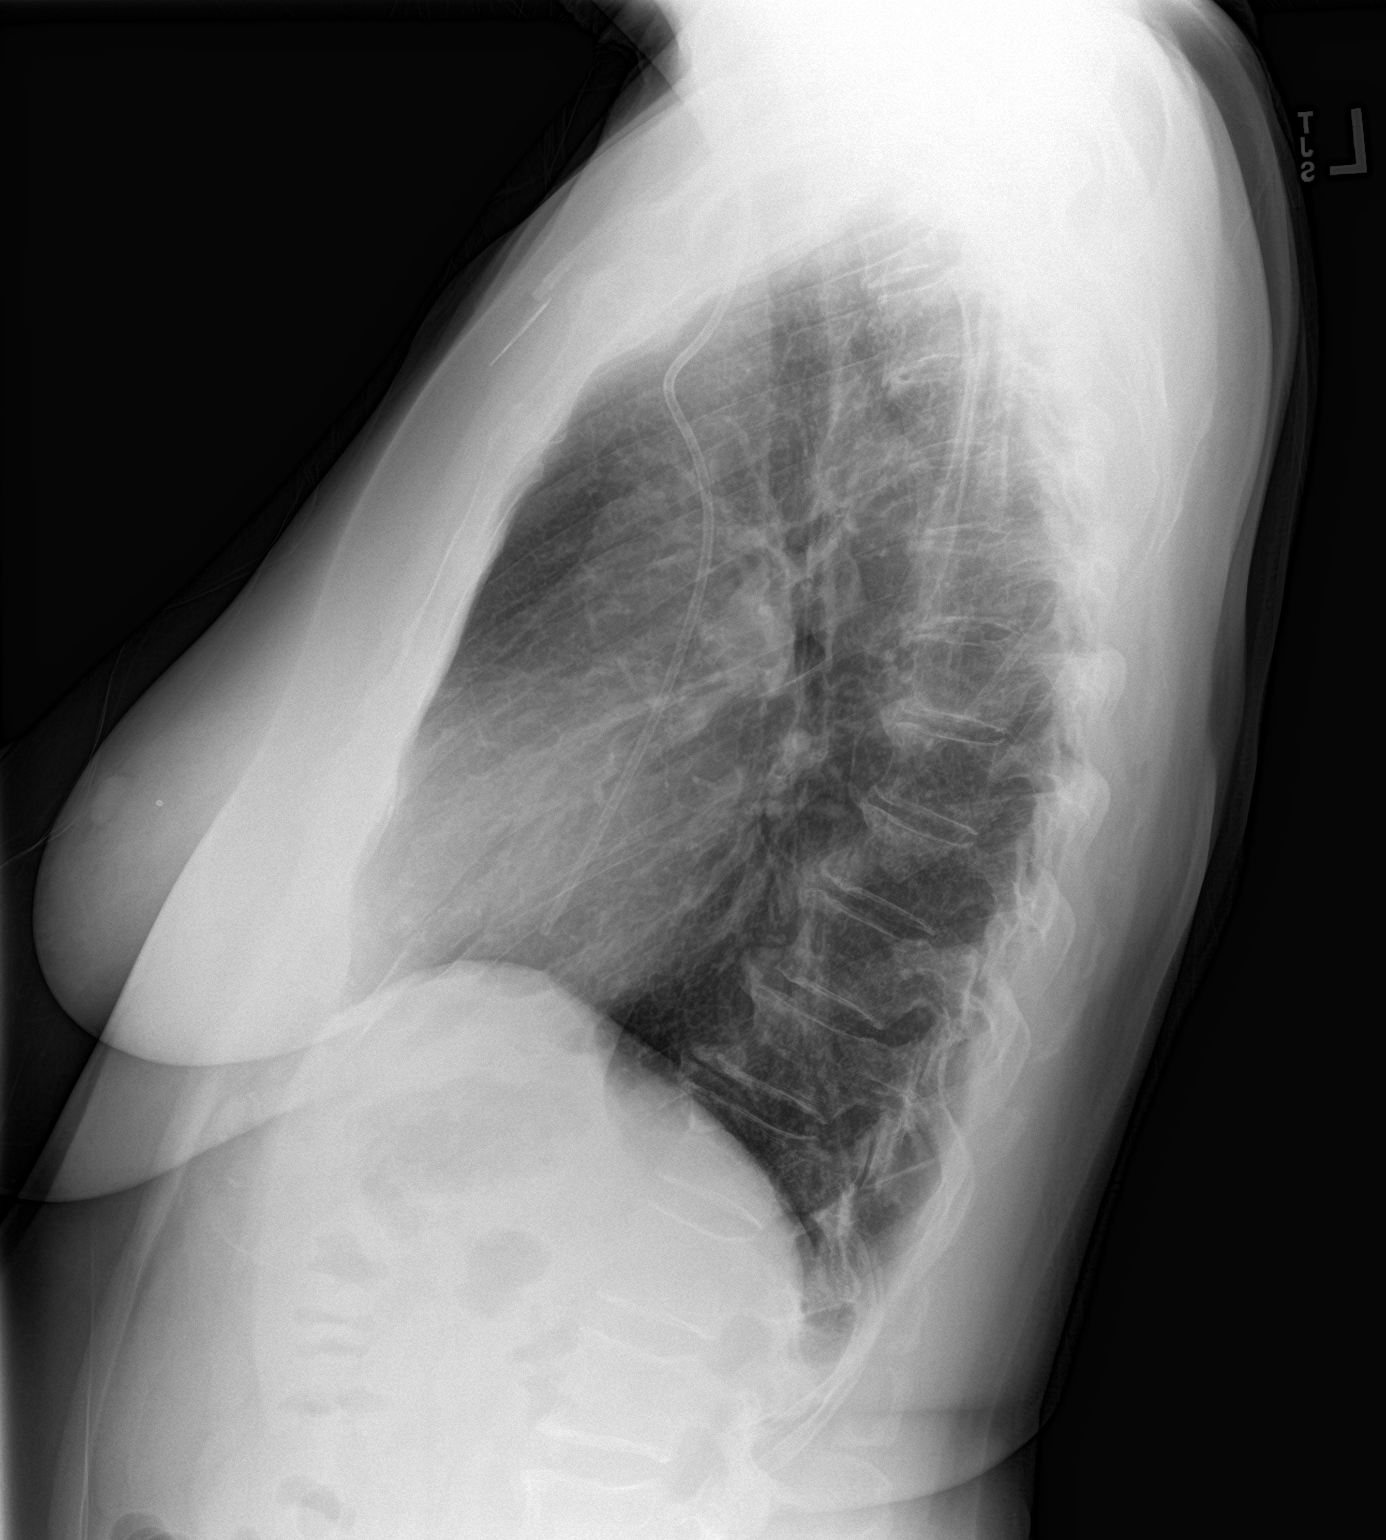

[2 of 2 positions shown; findings below may reference images not displayed]

FINDINGS: The heart size and mediastinal contours are within normal limits.
Both lungs are clear. Left internal jugular Port-A-Cath is unchanged
in position. The visualized skeletal structures are unremarkable.
IMPRESSION: No active cardiopulmonary disease.

## 2021-06-02 MED ORDER — SODIUM CHLORIDE 0.9% FLUSH
10.0000 mL | Freq: Once | INTRAVENOUS | Status: DC
  Filled 2021-06-02: qty 10

## 2021-06-02 MED ORDER — OYSTER SHELL CALCIUM/D3 500-5 MG-MCG PO TABS: 1.0000 | ORAL_TABLET | Freq: Every day | ORAL | 2 refills | Status: DC

## 2021-06-02 MED ORDER — HEPARIN SOD (PORK) LOCK FLUSH 100 UNIT/ML IV SOLN
500.0000 [IU] | Freq: Once | INTRAVENOUS | Status: AC
  Administered 2021-06-02: 500 [IU] via INTRAVENOUS
  Filled 2021-06-02: qty 5

## 2021-06-02 MED ORDER — SODIUM CHLORIDE 0.9 % IV SOLN
INTRAVENOUS | Status: AC
  Filled 2021-06-02 (×2): qty 250

## 2021-06-02 MED ORDER — POTASSIUM CHLORIDE 20 MEQ/100ML IV SOLN
20.0000 meq | Freq: Once | INTRAVENOUS | Status: AC
  Administered 2021-06-02: 20 meq via INTRAVENOUS

## 2021-06-02 NOTE — Progress Notes (Signed)
Pt reports that she has had cough and fever for about 3 days. She called on-call MD Saturday, who started her on augmentin. MD advised pt to f/u with [place] on Monday if still with fever. Pt reports that she is coughing and states that it is painful in upper chest with coughing. Temp this am was 100.2, per pt.

## 2021-06-02 NOTE — Progress Notes (Signed)
Symptom Management [place] at [facility] [phone] [phone]  Patient Care Team: Derinda Late, MD as PCP - General (Family Medicine) Christene Lye, MD (General Surgery) Rosina Lowenstein, MD (Obstetrics and Gynecology)   Name of the patient: Emma Cuevas  [id number]  10-Apr-1966   Date of visit: 06/02/21  Reason for Consult:  Emma Cuevas is a 56 year old woman with multiple medical problems including stage IIIa right breast cancer on [facility] chemotherapy, with cycle 4 last given 05/23/2021.  Patient called into the clinic on 05/30/2021 reporting several days of cold-like symptoms with rhinorrhea, nasal congestion, postnasal drip, sore throat, and cough.  She was having no fever or chills at that point, no shortness of breath or wheezing.  She took a home COVID test, which was negative.  Symptomatic care was recommended.  Patient declined COVID/flu PCR testing.  Patient presents to [facility] today with complaint of fever.  She continues to have some rhinorrhea and sore throat but denies shortness of breath, wheezing.  She feels more congested when she lies flat.  She says her fever has trended low 100s over the weekend.  She spoke with on-call oncologist, Dr. Grayland Ormond, who started her on Augmentin.  Patient is taking that for 2 days and feels somewhat improved but wanted an assessment regarding ongoing fever.  Denies any neurologic complaints. Denies any easy bleeding or bruising. Reports fair appetite and denies weight loss. Denies chest pain. Denies any nausea, vomiting, constipation.  Has had a couple of loose stools since starting antibiotics.  Denies urinary complaints. Patient offers no further specific complaints today.  PAST MEDICAL HISTORY: Past Medical History:  Diagnosis Date   Anemia    Benign neoplasm of breast 2013   left breast   BRCA negative 2015   Cancer ([place])    Family history of bladder cancer    Family  history of breast cancer    Family history of malignant neoplasm of breast 2013   Family history of pancreatic cancer    Screening for obesity     PAST SURGICAL HISTORY:  Past Surgical History:  Procedure Laterality Date   ABDOMINAL HYSTERECTOMY  2011   partial   BREAST BIOPSY Left 2013   BREAST BIOPSY Right 03/14/2021   Stereo bx-anterior calcs, "coil" clip-path pending   BREAST BIOPSY Right 03/14/2021   stereo bx-calcs, "Ribbon" clip-path pending   breast biopsy Right 03/14/2021   [place] Bx, Axilla, path pending   BREAST BIOPSY Left 04/30/2021   BREAST SURGERY Left 10/16/2011   left breast finesse biopsy   COLONOSCOPY WITH PROPOFOL N/A 12/05/2014   Procedure: COLONOSCOPY WITH PROPOFOL;  Surgeon: Christene Lye, MD;  Location: [facility] ENDOSCOPY;  Service: Endoscopy;  Laterality: N/A;   DILATION AND CURETTAGE OF UTERUS     IR CV LINE INJECTION  05/06/2021   IR IMAGING GUIDED PORT INSERTION  05/08/2021   PORTACATH PLACEMENT N/A 04/02/2021   Procedure: INSERTION PORT-A-CATH;  Surgeon: Herbert Pun, MD;  Location: [facility] ORS;  Service: General;  Laterality: N/A;    HEMATOLOGY/ONCOLOGY HISTORY:  Oncology History  Breast cancer, stage 1, estrogen receptor positive, right ([place])  03/25/2021 Initial Diagnosis   Breast cancer, stage 1, estrogen receptor positive, right ([place])   03/25/2021 Cancer Staging   Staging form: Breast, AJCC 8th Edition - Clinical stage from 03/25/2021: Stage IIIA (cT3, cN1, cM0, G3, ER+, PR+, HER2-) - Signed by Sindy Guadeloupe, MD on 03/25/2021 Histologic grading system: 3 grade system  04/07/2021 -  Chemotherapy   Patient is on Treatment Plan : BREAST NEOADJUVANT DOSE DENSE AC q14d / PACLitaxel q7d      Genetic Testing   Negative genetic testing. No pathogenic variants identified on the [facility] CancerNext-Expanded+RNA panel. The report date is 05/05/2021.  The CancerNext-Expanded + RNAinsight gene panel offered by [employer] and includes  sequencing and rearrangement analysis for the following 77 genes: IP, ALK, APC*, ATM*, AXIN2, BAP1, BARD1, BLM, BMPR1A, BRCA1*, BRCA2*, BRIP1*, CDC73, CDH1*,CDK4, CDKN1B, CDKN2A, CHEK2*, CTNNA1, DICER1, FANCC, FH, FLCN, GALNT12, KIF1B, LZTR1, MAX, MEN1, MET, MLH1*, MSH2*, MSH3, MSH6*, MUTYH*, NBN, NF1*, NF2, NTHL1, PALB2*, PHOX2B, PMS2*, POT1, PRKAR1A, PTCH1, PTEN*, RAD51C*, RAD51D*,RB1, RECQL, RET, SDHA, SDHAF2, SDHB, SDHC, SDHD, SMAD4, SMARCA4, SMARCB1, SMARCE1, STK11, SUFU, TMEM127, TP53*,TSC1, TSC2, VHL and XRCC2 (sequencing and deletion/duplication); EGFR, EGLN1, HOXB13, KIT, MITF, PDGFRA, POLD1 and POLE (sequencing only); EPCAM and GREM1 (deletion/duplication only).     ALLERGIES:  is allergic to tape.  MEDICATIONS:  Current Outpatient Medications  Medication Sig Dispense Refill   acetaminophen (TYLENOL) 500 MG tablet Take 1,000 mg by mouth every 6 (six) hours as needed for moderate pain.     amoxicillin-clavulanate (AUGMENTIN) 875-125 MG tablet Take 1 tablet by mouth 2 (two) times daily. (Patient not taking: Reported on 05/23/2021) 10 tablet 0   amoxicillin-clavulanate (AUGMENTIN) 875-125 MG tablet Take 1 tablet by mouth 2 (two) times daily. 14 tablet 0   dexamethasone (DECADRON) 4 MG tablet Take 2 tablets (8 mg total) by mouth daily. Take daily for 3 days after chemo. Take with food. 30 tablet 1   lidocaine-prilocaine (EMLA) cream Apply to affected area once 30 g 3   LORazepam (ATIVAN) 0.5 MG tablet Take 1 tablet (0.5 mg total) by mouth every 6 (six) hours as needed (Nausea or vomiting). (Patient not taking: Reported on 04/07/2021) 30 tablet 0   metroNIDAZOLE (METROGEL) 0.75 % gel Apply 1 application topically daily as needed (rosacea).     ondansetron (ZOFRAN) 8 MG tablet Take 1 tablet (8 mg total) by mouth 2 (two) times daily as needed. Start on the third day after chemotherapy. (Patient not taking: Reported on 04/07/2021) 30 tablet 1   prochlorperazine (COMPAZINE) 10 MG tablet Take 1  tablet (10 mg total) by mouth every 6 (six) hours as needed (Nausea or vomiting). (Patient not taking: Reported on 04/07/2021) 30 tablet 1   triamcinolone ointment (KENALOG) 0.5 % Apply 1 application topically 2 (two) times daily. OVER THE PORT SITE FOR REDNESS (Patient not taking: Reported on 05/23/2021) 30 g 0   ZIEXTENZO 6 MG/0.6ML injection INJECT THE CONTENTS OF ONE SYRINGE (6 MG) UNDER THE SKIN 24 TO 72 HOURS AFTER CHEMO EVERY 14 DAYS 0.6 mL 1   No current facility-administered medications for this visit.    VITAL SIGNS: BP (!) 105/53    Pulse 98    Resp 17    SpO2 100%  There were no vitals filed for this visit.  Estimated body mass index is 29.73 kg/m as calculated from the following:   Height as of 05/08/21: 5' 5.5" (1.664 m).   Weight as of 05/23/21: 181 lb 6.4 oz (82.3 kg).  LABS: CBC:    Component Value Date/Time   WBC 4.0 06/02/2021 1036   HGB 8.0 (L) 06/02/2021 1036   HCT 24.5 (L) 06/02/2021 1036   PLT 76 (L) 06/02/2021 1036   MCV 86.3 06/02/2021 1036   NEUTROABS PENDING 06/02/2021 1036   LYMPHSABS PENDING 06/02/2021 1036   MONOABS PENDING 06/02/2021 1036  EOSABS PENDING 06/02/2021 1036   BASOSABS PENDING 06/02/2021 1036   Comprehensive Metabolic Panel:    Component Value Date/Time   NA 132 (L) 06/02/2021 1036   K 3.2 (L) 06/02/2021 1036   CL 99 06/02/2021 1036   CO2 26 06/02/2021 1036   BUN 15 06/02/2021 1036   CREATININE 0.78 06/02/2021 1036   GLUCOSE 104 (H) 06/02/2021 1036   CALCIUM 8.3 (L) 06/02/2021 1036   AST 15 06/02/2021 1036   ALT 17 06/02/2021 1036   ALKPHOS 61 06/02/2021 1036   BILITOT 0.2 (L) 06/02/2021 1036   PROT 6.5 06/02/2021 1036   ALBUMIN 3.3 (L) 06/02/2021 1036    RADIOGRAPHIC STUDIES: IR CV Line Injection  Result Date: 05/06/2021 CLINICAL DATA:  Patient with history of right-sided breast cancer with a left internal jugular Port-A-Cath placed surgically on 04/02/21. Patient presents today for port catheter check given difficulty  aspirating with no blood return. EXAM: CONTRAST INJECTION OF PORT A CATH UNDER FLUOROSCOPY CONTRAST:  Omnipaque 350 total of 75m FLUOROSCOPY TIME:  Fluoroscopy Time: 18 seconds  (3.2 mGy). COMPLICATIONS: None immediate. PROCEDURE: Contrast was administered via the indwelling port after it was accessed in a sterile fashion. Fluoroscopic spot images were obtained of the catheter during injection. Heparin was injected into the catheter and the port was de-accessed and a sterile bandage was applied. IMPRESSION: Successful fluoroscopic guided left sided port a catheter contrast injection, findings reveal the presence of a fibrin sheath and distal catheter tip at level of carina. Would recommend port a catheter exchange for optimal placement of distal catheter tip into the SVC/RA junction and disrupt fibrin sheath to allow for better aspiration. The findings have been discussed today with the ordering provider and the patient will be scheduled for port revision with our service. This exam was performed by KTsosie BillingPA-C, and was supervised and interpreted by Dr. EDenna Haggard Electronically Signed   By: YAlbin FellingM.D.   On: 05/06/2021 12:03   IR IMAGING GUIDED PORT INSERTION  Result Date: 05/08/2021 INDICATION: History of right-sided breast cancer, now with malfunctioning surgically placed port a catheter. Patient presents today for definitive Port a catheter replacement. EXAM: IMPLANTED PORT A CATH PLACEMENT WITH ULTRASOUND AND FLUOROSCOPIC GUIDANCE COMPARISON:  Port injection-05/06/2021 MEDICATIONS: None ANESTHESIA/SEDATION: Moderate (conscious) sedation was employed during this procedure as administered by the Interventional Radiology RN. A total of Versed 2 mg and Fentanyl 100 mcg was administered intravenously. Moderate Sedation Time: 22 minutes. The patient's level of consciousness and vital signs were monitored continuously by radiology nursing throughout the procedure under my direct supervision. CONTRAST:   None FLUOROSCOPY TIME:  24 seconds (2.1 mGy) COMPLICATIONS: None immediate. PROCEDURE: The procedure, risks, benefits, and alternatives were explained to the patient. Questions regarding the procedure were encouraged and answered. The patient understands and consents to the procedure. The left neck and chest were prepped with chlorhexidine in a sterile fashion, and a sterile drape was applied covering the operative field. Maximum barrier sterile technique with sterile gowns and gloves were used for the procedure. A timeout was performed prior to the initiation of the procedure. Local anesthesia was provided with 1% lidocaine with epinephrine. After creating a small venotomy incision, a micropuncture kit was utilized to access the internal jugular vein. Real-time ultrasound guidance was utilized for vascular access including the acquisition of a permanent ultrasound image documenting patency of the accessed vessel. The microwire was utilized to measure appropriate catheter length. Next, the surgically placed port a catheter reservoir was dissected and  the surgically placed port a Catheter was removed intact Utilizing the same port pocket, a new single lumen "ISP" sized power injectable port was chosen for placement. The 8 Fr catheter was tunneled from the port pocket site to the venotomy incision. The port was placed in the pocket. The external catheter was trimmed to appropriate length. At the venotomy, an 8 Fr peel-away sheath was placed over a guidewire under fluoroscopic guidance. The catheter was then placed through the sheath and the sheath was removed. Final catheter positioning was confirmed and documented with a fluoroscopic spot radiograph. The port was accessed with a Huber needle, aspirated and flushed with heparinized saline. The venotomy site was closed with an interrupted 4-0 Vicryl suture. The port pocket incision was closed with interrupted 2-0 Vicryl suture. The skin was opposed with a running  subcuticular 4-0 Vicryl suture. Dermabond and Steri-strips were applied to both incisions. Dressings were applied. The patient tolerated the procedure well without immediate post procedural complication. FINDINGS: Preprocedural spot fluoroscopic image demonstrates unchanged positioning of malpositioned port a catheter with end overlying expected location the superior aspect of the SVC. After definitive Port a catheter replacement, the new port a catheter tip lies within the superior cavoatrial junction. The catheter aspirates and flushes normally and is ready for immediate use. IMPRESSION: Successful revision of left internal jugular approach power injectable Port-A-Cath with tip now appropriately positioned at the level of the superior cavoatrial junction. The catheter is ready for immediate use. Electronically Signed   By: Sandi Mariscal M.D.   On: 05/08/2021 16:51    PERFORMANCE STATUS (ECOG) : 1 - Symptomatic but completely ambulatory  Review of Systems Unless otherwise noted, a complete review of systems is negative.  Physical Exam General: NAD Cardiovascular: regular rate and rhythm Pulmonary: clear anterior/posterior fields  Abdomen: soft, nontender, + bowel sounds GU: no suprapubic tenderness Extremities: no edema, no joint deformities Skin: no rashes Neurological: Grossly nonfocal  Assessment and Plan- Patient is a 56 y.o. female with stage IIIa right breast cancer on AC chemotherapy, with cycle 4 given 05/23/2021.  Patient presents to [facility] for evaluation of rhinorrhea, nasal congestion, and fevers   URI -patient is nontoxic-appearing.  She is afebrile in clinic.  Labs are grossly unremarkable other than mild hyponatremia and hypokalemia.  Mild anemia and thrombocytopenia are secondary to chemotherapy effects and likely reflect nadir.  We will give IV fluids and replete K.  Start Os-Cal with vitamin D for mild hypocalcemia.  Agree with patient completing course of Augmentin.  We will send her  for COVID/flu PCR, although patient is effectively outside the treatment range, this information may help with quarantining and patient education.  Discussed symptomatic/supportive care.  Patient RTC as needed.   Patient expressed understanding and was in agreement with this plan. She also understands that She can call clinic at any time with any questions, concerns, or complaints.   Thank you for allowing me to participate in the care of this very pleasant patient.   Time Total: 20 minutes  Visit consisted of counseling and education dealing with the complex and emotionally intense issues of symptom management in the setting of serious illness.Greater than 50%  of this time was spent counseling and coordinating care related to the above assessment and plan.  Signed by: Altha Harm, PhD, NP-C

## 2021-06-03 LAB — URINE CULTURE: Culture: NO GROWTH

## 2021-06-04 ENCOUNTER — Other Ambulatory Visit: Payer: Managed Care, Other (non HMO)

## 2021-06-04 ENCOUNTER — Ambulatory Visit: Payer: Managed Care, Other (non HMO) | Admitting: Oncology

## 2021-06-04 ENCOUNTER — Ambulatory Visit: Payer: Managed Care, Other (non HMO) | Admitting: Podiatry

## 2021-06-04 ENCOUNTER — Ambulatory Visit: Payer: Managed Care, Other (non HMO)

## 2021-06-06 ENCOUNTER — Inpatient Hospital Stay: Payer: Managed Care, Other (non HMO)

## 2021-06-06 ENCOUNTER — Other Ambulatory Visit: Payer: Self-pay

## 2021-06-06 ENCOUNTER — Encounter: Payer: Self-pay | Admitting: Oncology

## 2021-06-06 ENCOUNTER — Inpatient Hospital Stay (HOSPITAL_BASED_OUTPATIENT_CLINIC_OR_DEPARTMENT_OTHER): Payer: Managed Care, Other (non HMO) | Admitting: Oncology

## 2021-06-06 VITALS — BP 110/69 | HR 95 | Temp 97.1°F | Resp 16 | Ht 65.0 in | Wt 180.0 lb

## 2021-06-06 VITALS — BP 115/64 | HR 82 | Resp 18

## 2021-06-06 DIAGNOSIS — D649 Anemia, unspecified: Secondary | ICD-10-CM

## 2021-06-06 DIAGNOSIS — Z170 Estrogen receptor positive status [ER+]: Secondary | ICD-10-CM | POA: Diagnosis not present

## 2021-06-06 DIAGNOSIS — C50911 Malignant neoplasm of unspecified site of right female breast: Secondary | ICD-10-CM | POA: Diagnosis not present

## 2021-06-06 DIAGNOSIS — Z5111 Encounter for antineoplastic chemotherapy: Secondary | ICD-10-CM

## 2021-06-06 DIAGNOSIS — Z95828 Presence of other vascular implants and grafts: Secondary | ICD-10-CM

## 2021-06-06 LAB — CBC WITH DIFFERENTIAL/PLATELET
Abs Immature Granulocytes: 0.42 10*3/uL — ABNORMAL HIGH (ref 0.00–0.07)
Basophils Absolute: 0.1 10*3/uL (ref 0.0–0.1)
Basophils Relative: 1 %
Eosinophils Absolute: 0 10*3/uL (ref 0.0–0.5)
Eosinophils Relative: 0 %
HCT: 23.1 % — ABNORMAL LOW (ref 36.0–46.0)
Hemoglobin: 7.3 g/dL — ABNORMAL LOW (ref 12.0–15.0)
Immature Granulocytes: 7 %
Lymphocytes Relative: 12 %
Lymphs Abs: 0.8 10*3/uL (ref 0.7–4.0)
MCH: 28 pg (ref 26.0–34.0)
MCHC: 31.6 g/dL (ref 30.0–36.0)
MCV: 88.5 fL (ref 80.0–100.0)
Monocytes Absolute: 0.8 10*3/uL (ref 0.1–1.0)
Monocytes Relative: 12 %
Neutro Abs: 4.4 10*3/uL (ref 1.7–7.7)
Neutrophils Relative %: 68 %
Platelets: 269 10*3/uL (ref 150–400)
RBC: 2.61 MIL/uL — ABNORMAL LOW (ref 3.87–5.11)
RDW: 16.9 % — ABNORMAL HIGH (ref 11.5–15.5)
Smear Review: NORMAL
WBC: 6.4 10*3/uL (ref 4.0–10.5)
nRBC: 0.6 % — ABNORMAL HIGH (ref 0.0–0.2)

## 2021-06-06 LAB — COMPREHENSIVE METABOLIC PANEL
ALT: 12 U/L (ref 0–44)
AST: 20 U/L (ref 15–41)
Albumin: 3.1 g/dL — ABNORMAL LOW (ref 3.5–5.0)
Alkaline Phosphatase: 59 U/L (ref 38–126)
Anion gap: 6 (ref 5–15)
BUN: 9 mg/dL (ref 6–20)
CO2: 24 mmol/L (ref 22–32)
Calcium: 8.3 mg/dL — ABNORMAL LOW (ref 8.9–10.3)
Chloride: 105 mmol/L (ref 98–111)
Creatinine, Ser: 0.72 mg/dL (ref 0.44–1.00)
GFR, Estimated: >60 mL/min (ref >60)
Glucose, Bld: 124 mg/dL — ABNORMAL HIGH (ref 70–99)
Potassium: 3.5 mmol/L (ref 3.5–5.1)
Sodium: 135 mmol/L (ref 135–145)
Total Bilirubin: 0.3 mg/dL (ref 0.3–1.2)
Total Protein: 6.1 g/dL — ABNORMAL LOW (ref 6.5–8.1)

## 2021-06-06 LAB — IRON AND TIBC
Iron: 60 ug/dL (ref 28–170)
Saturation Ratios: 26 % (ref 10.4–31.8)
TIBC: 232 ug/dL — ABNORMAL LOW (ref 250–450)
UIBC: 172 ug/dL

## 2021-06-06 LAB — VITAMIN B12: Vitamin B-12: 1434 pg/mL — ABNORMAL HIGH (ref 180–914)

## 2021-06-06 LAB — FERRITIN: Ferritin: 379 ng/mL — ABNORMAL HIGH (ref 11–307)

## 2021-06-06 LAB — FOLATE: Folate: 6.8 ng/mL (ref >5.9)

## 2021-06-06 MED ORDER — FAMOTIDINE IN NACL 20-0.9 MG/50ML-% IV SOLN
20.0000 mg | Freq: Once | INTRAVENOUS | Status: AC
  Administered 2021-06-06: 20 mg via INTRAVENOUS
  Filled 2021-06-06: qty 50

## 2021-06-06 MED ORDER — SODIUM CHLORIDE 0.9 % IV SOLN
10.0000 mg | Freq: Once | INTRAVENOUS | Status: AC
  Administered 2021-06-06: 10 mg via INTRAVENOUS
  Filled 2021-06-06: qty 10

## 2021-06-06 MED ORDER — SODIUM CHLORIDE 0.9 % IV SOLN
80.0000 mg/m2 | Freq: Once | INTRAVENOUS | Status: AC
  Administered 2021-06-06: 156 mg via INTRAVENOUS
  Filled 2021-06-06: qty 26

## 2021-06-06 MED ORDER — DIPHENHYDRAMINE HCL 50 MG/ML IJ SOLN
50.0000 mg | Freq: Once | INTRAMUSCULAR | Status: AC
  Administered 2021-06-06: 50 mg via INTRAVENOUS
  Filled 2021-06-06: qty 1

## 2021-06-06 MED ORDER — HEPARIN SOD (PORK) LOCK FLUSH 100 UNIT/ML IV SOLN
500.0000 [IU] | Freq: Once | INTRAVENOUS | Status: AC
  Administered 2021-06-06: 500 [IU] via INTRAVENOUS
  Filled 2021-06-06: qty 5

## 2021-06-06 MED ORDER — SODIUM CHLORIDE 0.9 % IV SOLN
Freq: Once | INTRAVENOUS | Status: AC
  Filled 2021-06-06: qty 250

## 2021-06-06 NOTE — Progress Notes (Signed)
Wants to know if she should use dexamethasone and if she will need the inj.

## 2021-06-06 NOTE — Progress Notes (Signed)
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  This was followed by diagnostic mammogram and ultrasound.  Mammogram showed extensive suspicious pleomorphic calcifications measuring up to 6.8 cm.  2 abnormal lymph nodes were seen in the right axilla on ultrasound as well.  Both the calcifications and the lymph node was biopsied.  Anterior and posterior end of the calcifications was consistent with invasive mammary carcinoma grade 3.  Lymph node was positive for metastatic carcinoma as well.  ER 91 to 100% positive PR 41 to 50% positive and HER2 negative.  Ki-67 40%   Menarche at the age of 57.  She is G2, P2 L2.  Age at first birth 52.  She used birth control pills for about 8 to 10 years.  She had a hysterectomy at the age of 15 but ovaries are still in situ.  Family history significant for breast cancer in her mother and paternal aunt.  Father with metastatic bladder cancer and maternal grandfather with pancreatic cancer.  Patient had BRCA 1 and 2 testing done in the past and was reportedly negative.   MRI bilateral breasts showed non-mass enhancement involving the upper outer lower outer and upper inner quadrants finding 10.9 x 8.4  x 6.8 cm.  Non-mass enhancement in the left breast spanning 1.6 x 1.3 x 0.8 cm.  6 metastatic right axillary lymph nodes.   CT chest abdomen and pelvis with contrast showed mild right axillary adenopathy compatible with metastatic disease but no evidence of distant metastatic disease.  5 mm left lower lobe nodule.  Bone scan was negative for metastatic disease.    Interval history- patient reports ongoing fatigue.  She had problems with her first port and had to get a second port put in.  It is functioning well so far.  ECOG PS- 1 Pain scale- 0   Review of systems- Review of Systems  Constitutional:  Positive for malaise/fatigue. Negative for chills, fever and weight loss.  HENT:  Negative for congestion, ear discharge and nosebleeds.   Eyes:  Negative for blurred vision.  Respiratory:  Negative for cough, hemoptysis, sputum production, shortness of breath and wheezing.   Cardiovascular:  Negative for chest pain, palpitations, orthopnea and claudication.  Gastrointestinal:  Negative for abdominal pain, blood in stool, constipation, diarrhea, heartburn, melena, nausea and vomiting.  Genitourinary:  Negative for dysuria, flank pain, frequency, hematuria and urgency.  Musculoskeletal:  Negative for back pain, joint pain and myalgias.  Skin:  Negative for rash.  Neurological:  Negative for dizziness, tingling, focal weakness, seizures, weakness and headaches.  Endo/Heme/Allergies:  Does not bruise/bleed easily.  Psychiatric/Behavioral:  Negative for depression and suicidal ideas. The patient does not have insomnia.      Allergies  Allergen Reactions   Tape     Redness and burning  Past Medical History:  Diagnosis Date   Anemia    Benign neoplasm of breast 2013   left breast   BRCA negative 2015   Cancer [facility])    Family history of bladder cancer    Family history of breast cancer    Family history of malignant neoplasm of breast 2013   Family history of pancreatic cancer     Screening for obesity      Past Surgical History:  Procedure Laterality Date   ABDOMINAL HYSTERECTOMY  2011   partial   BREAST BIOPSY Left 2013   BREAST BIOPSY Right 03/14/2021   Stereo bx-anterior calcs, "coil" clip-path pending   BREAST BIOPSY Right 03/14/2021   stereo bx-calcs, "Ribbon" clip-path pending   breast biopsy Right 03/14/2021   [place] Bx, Axilla, path pending   BREAST BIOPSY Left 04/30/2021   BREAST SURGERY Left 10/16/2011   left breast finesse biopsy   COLONOSCOPY WITH PROPOFOL N/A 12/05/2014   Procedure: COLONOSCOPY WITH PROPOFOL;  Surgeon: Christene Lye, MD;  Location: [facility] ENDOSCOPY;  Service: Endoscopy;  Laterality: N/A;   DILATION AND CURETTAGE OF UTERUS     IR CV LINE INJECTION  05/06/2021   IR IMAGING GUIDED PORT INSERTION  05/08/2021   PORTACATH PLACEMENT N/A 04/02/2021   Procedure: INSERTION PORT-A-CATH;  Surgeon: Herbert Pun, MD;  Location: [facility] ORS;  Service: General;  Laterality: N/A;    Social History   Socioeconomic History   Marital status: Married    Spouse name: Legrand Como   Number of children: Not on file   Years of education: Not on file   Highest education level: Not on file  Occupational History   Not on file  Tobacco Use   Smoking status: Never   Smokeless tobacco: Never  Substance and Sexual Activity   Alcohol use: No   Drug use: No   Sexual activity: Not on file  Other Topics Concern   Not on file  Social History Narrative   Not on file   Social Determinants of Health   Financial Resource Strain: Not on file  Food Insecurity: Not on file  Transportation Needs: Not on file  Physical Activity: Not on file  Stress: Not on file  Social Connections: Not on file  Intimate Partner Violence: Not on file    Family History  Problem Relation Age of Onset   Breast cancer Mother 32   Bladder Cancer Father 6   Breast cancer Paternal Aunt 68   Pancreatic cancer Paternal Uncle 68   Pancreatic cancer Maternal  Grandfather 73   Cancer Cousin        in ear     Current Outpatient Medications:    acetaminophen (TYLENOL) 500 MG tablet, Take 1,000 mg by mouth every 6 (six) hours as needed for moderate pain., Disp: , Rfl:    amoxicillin-clavulanate (AUGMENTIN) 875-125 MG tablet, Take 1 tablet by mouth 2 (two) times daily. (Patient not taking: Reported on 05/23/2021), Disp: 10 tablet, Rfl: 0   amoxicillin-clavulanate (AUGMENTIN) 875-125 MG tablet, Take 1 tablet by mouth 2 (two) times daily., Disp: 14 tablet, Rfl: 0   calcium-vitamin D (OSCAL WITH D) 500-5 MG-MCG tablet, Take 1 tablet by mouth daily with breakfast., Disp: 90 tablet, Rfl: 2   dexamethasone (DECADRON) 4 MG tablet, Take 2 tablets (8 mg total) by mouth daily. Take daily for 3 days after chemo. Take with food., Disp: 30 tablet, Rfl: 1   lidocaine-prilocaine (EMLA) cream, Apply to affected area once, Disp: 30 g, Rfl: 3  LORazepam (ATIVAN) 0.5 MG tablet, Take 1 tablet (0.5 mg total) by mouth every 6 (six) hours as needed (Nausea or vomiting). (Patient not taking: Reported on 04/07/2021), Disp: 30 tablet, Rfl: 0   metroNIDAZOLE (METROGEL) 0.75 % gel, Apply 1 application topically daily as needed (rosacea)., Disp: , Rfl:    ondansetron (ZOFRAN) 8 MG tablet, Take 1 tablet (8 mg total) by mouth 2 (two) times daily as needed. Start on the third day after chemotherapy. (Patient not taking: Reported on 04/07/2021), Disp: 30 tablet, Rfl: 1   prochlorperazine (COMPAZINE) 10 MG tablet, Take 1 tablet (10 mg total) by mouth every 6 (six) hours as needed (Nausea or vomiting). (Patient not taking: Reported on 04/07/2021), Disp: 30 tablet, Rfl: 1   triamcinolone ointment (KENALOG) 0.5 %, Apply 1 application topically 2 (two) times daily. OVER THE PORT SITE FOR REDNESS (Patient not taking: Reported on 05/23/2021), Disp: 30 g, Rfl: 0   ZIEXTENZO 6 MG/0.6ML injection, INJECT THE CONTENTS OF ONE SYRINGE (6 MG) UNDER THE SKIN 24 TO 72 HOURS AFTER CHEMO EVERY 14 DAYS,  Disp: 0.6 mL, Rfl: 1  Physical exam:  Vitals:   06/06/21 0914  BP: 110/69  Pulse: 95  Resp: 16  Temp: (!) 97.1 F (36.2 C)  TempSrc: Tympanic  Weight: 180 lb (81.6 kg)  Height: '5\' 5"'  (1.651 m)   Physical Exam Cardiovascular:     Rate and Rhythm: Normal rate and regular rhythm.     Heart sounds: Normal heart sounds.  Pulmonary:     Effort: Pulmonary effort is normal.     Breath sounds: Normal breath sounds.  Abdominal:     General: Bowel sounds are normal.     Palpations: Abdomen is soft.  Skin:    General: Skin is warm and dry.  Neurological:     Mental Status: She is alert and oriented to person, place, and time.     CMP Latest Ref Rng & Units 06/06/2021  Glucose 70 - 99 mg/dL 124(H)  BUN 6 - 20 mg/dL 9  Creatinine 0.44 - 1.00 mg/dL 0.72  Sodium 135 - 145 mmol/L 135  Potassium 3.5 - 5.1 mmol/L 3.5  Chloride 98 - 111 mmol/L 105  CO2 22 - 32 mmol/L 24  Calcium 8.9 - 10.3 mg/dL 8.3(L)  Total Protein 6.5 - 8.1 g/dL 6.1(L)  Total Bilirubin 0.3 - 1.2 mg/dL 0.3  Alkaline Phos 38 - 126 U/L 59  AST 15 - 41 U/L 20  ALT 0 - 44 U/L 12   CBC Latest Ref Rng & Units 06/06/2021  WBC 4.0 - 10.5 K/uL 6.4  Hemoglobin 12.0 - 15.0 g/dL 7.3(L)  Hematocrit 36.0 - 46.0 % 23.1(L)  Platelets 150 - 400 K/uL 269    No images are attached to the encounter.  DG Chest 2 View  Result Date: 06/02/2021 CLINICAL DATA:  Fever, cough. EXAM: CHEST - 2 VIEW COMPARISON:  April 02, 2021. FINDINGS: The heart size and mediastinal contours are within normal limits. Both lungs are clear. Left internal jugular Port-A-Cath is unchanged in position. The visualized skeletal structures are unremarkable. IMPRESSION: No active cardiopulmonary disease. Electronically Signed   By: Marijo Conception M.D.   On: 06/02/2021 13:35   IR IMAGING GUIDED PORT INSERTION  Result Date: 05/08/2021 INDICATION: History of right-sided breast cancer, now with malfunctioning surgically placed port a catheter. Patient presents  today for definitive Port a catheter replacement. EXAM: IMPLANTED PORT A CATH PLACEMENT WITH ULTRASOUND AND FLUOROSCOPIC GUIDANCE COMPARISON:  Port injection-05/06/2021 MEDICATIONS:  None ANESTHESIA/SEDATION: Moderate (conscious) sedation was employed during this procedure as administered by the Interventional Radiology RN. A total of Versed 2 mg and Fentanyl 100 mcg was administered intravenously. Moderate Sedation Time: 22 minutes. The patient's level of consciousness and vital signs were monitored continuously by radiology nursing throughout the procedure under my direct supervision. CONTRAST:  None FLUOROSCOPY TIME:  24 seconds (2.1 mGy) COMPLICATIONS: None immediate. PROCEDURE: The procedure, risks, benefits, and alternatives were explained to the patient. Questions regarding the procedure were encouraged and answered. The patient understands and consents to the procedure. The left neck and chest were prepped with chlorhexidine in a sterile fashion, and a sterile drape was applied covering the operative field. Maximum barrier sterile technique with sterile gowns and gloves were used for the procedure. A timeout was performed prior to the initiation of the procedure. Local anesthesia was provided with 1% lidocaine with epinephrine. After creating a small venotomy incision, a micropuncture kit was utilized to access the internal jugular vein. Real-time ultrasound guidance was utilized for vascular access including the acquisition of a permanent ultrasound image documenting patency of the accessed vessel. The microwire was utilized to measure appropriate catheter length. Next, the surgically placed port a catheter reservoir was dissected and the surgically placed port a Catheter was removed intact Utilizing the same port pocket, a new single lumen "ISP" sized power injectable port was chosen for placement. The 8 Fr catheter was tunneled from the port pocket site to the venotomy incision. The port was placed in the  pocket. The external catheter was trimmed to appropriate length. At the venotomy, an 8 Fr peel-away sheath was placed over a guidewire under fluoroscopic guidance. The catheter was then placed through the sheath and the sheath was removed. Final catheter positioning was confirmed and documented with a fluoroscopic spot radiograph. The port was accessed with a Huber needle, aspirated and flushed with heparinized Cuevas. The venotomy site was closed with an interrupted 4-0 Vicryl suture. The port pocket incision was closed with interrupted 2-0 Vicryl suture. The skin was opposed with a running subcuticular 4-0 Vicryl suture. Dermabond and Steri-strips were applied to both incisions. Dressings were applied. The patient tolerated the procedure well without immediate post procedural complication. FINDINGS: Preprocedural spot fluoroscopic image demonstrates unchanged positioning of malpositioned port a catheter with end overlying expected location the superior aspect of the SVC. After definitive Port a catheter replacement, the new port a catheter tip lies within the superior cavoatrial junction. The catheter aspirates and flushes normally and is ready for immediate use. IMPRESSION: Successful revision of left internal jugular approach power injectable Port-A-Cath with tip now appropriately positioned at the level of the superior cavoatrial junction. The catheter is ready for immediate use. Electronically Signed   By: Sandi Mariscal M.D.   On: 05/08/2021 16:51     Assessment and plan- Patient is a 56 y.o. female with newly diagnosed clinical prognostic stage IIIa invasive mammary carcinoma of the right breast cT3 N1 M0 ER/PR positive HER2 negative.  She is s/p 4 cycles of neoadjuvant dose dense AC chemotherapy.  She is here for on treatment assessment prior to cycle 1 of Taxol chemotherapy  Counts okay to proceed with cycle 1 of Taxol chemotherapy today.  She will directly proceed for cycle 2 next week and I will see  her back in 3 weeks for cycle 3.  Plan is to complete 12 weekly cycles followed by surgery.  She does have an interim ultrasound scheduled for 06/12/2021.  Discussed risks  and benefits of Taxol including all but not limited to nausea, vomiting, low blood counts,Risk of infusion reaction as well as peripheral neuropathy.  Treatment is being given with a curative intent.  Patient understands and agrees to proceed as planned  Chemo induced anemia: Hemoglobin down to 7.3 today.  Ferritin and iron studies are consistent with anemia of chronic disease.  B12 is pending and folate is normal. Possible blood transfusion in 1 week if her hemoglobin drops to less than 7.  However patient is now done with [facility] chemotherapy and hopefully her counts should stabilize to improve with Taxol.  If anemia continues to be an issue we would have to reduce the dose of Taxol in the future   Visit Diagnosis 1. Encounter for antineoplastic chemotherapy   2. Breast cancer, stage 1, estrogen receptor positive, right ([place])      Dr. Randa Evens, MD, MPH [facility] [phone] 06/06/2021 9:04 AM

## 2021-06-06 NOTE — Progress Notes (Signed)
Dr Janese Banks aware of Hgb 7.3- ok to proceed

## 2021-06-06 NOTE — Patient Instructions (Signed)
[facility] CTR AT [place]  Discharge Instructions: Thank you for choosing [facility] to provide your oncology and hematology care.  If you have a lab appointment with the [facility], please go directly to the [facility] and check in at the registration area.  Wear comfortable clothing and clothing appropriate for easy access to any Portacath or PICC line.   We strive to give you quality time with your provider. You may need to reschedule your appointment if you arrive late (15 or more minutes).  Arriving late affects you and other patients whose appointments are after yours.  Also, if you miss three or more appointments without notifying the office, you may be dismissed from the clinic at the providers discretion.      For prescription refill requests, have your pharmacy contact our office and allow 72 hours for refills to be completed.    Today you received the following chemotherapy and/or immunotherapy agents TAXOL      To help prevent nausea and vomiting after your treatment, we encourage you to take your nausea medication as directed.  BELOW ARE SYMPTOMS THAT SHOULD BE REPORTED IMMEDIATELY: *FEVER GREATER THAN 100.4 F (38 C) OR HIGHER *CHILLS OR SWEATING *NAUSEA AND VOMITING THAT IS NOT CONTROLLED WITH YOUR NAUSEA MEDICATION *UNUSUAL SHORTNESS OF BREATH *UNUSUAL BRUISING OR BLEEDING *URINARY PROBLEMS (pain or burning when urinating, or frequent urination) *BOWEL PROBLEMS (unusual diarrhea, constipation, pain near the anus) TENDERNESS IN MOUTH AND THROAT WITH OR WITHOUT PRESENCE OF ULCERS (sore throat, sores in mouth, or a toothache) UNUSUAL RASH, SWELLING OR PAIN  UNUSUAL VAGINAL DISCHARGE OR ITCHING   Items with * indicate a potential emergency and should be followed up as soon as possible or go to the Emergency Department if any problems should occur.  Please show the CHEMOTHERAPY ALERT CARD or IMMUNOTHERAPY ALERT CARD at check-in to the  Emergency Department and triage nurse.  Should you have questions after your visit or need to cancel or reschedule your appointment, please contact [facility] [place] AT [place]  [phone] and follow the prompts.  Office hours are 8:00 a.m. to 4:30 p.m. Monday - Friday. Please note that voicemails left after 4:00 p.m. may not be returned until the following business day.  We are closed weekends and major holidays. You have access to a nurse at all times for urgent questions. Please call the main number to the clinic [phone] and follow the prompts.  For any non-urgent questions, you may also contact your provider using MyChart. We now offer e-Visits for anyone 77 and older to request care online for non-urgent symptoms. For details visit [email or web address].   Also download the MyChart app! Go to the app store, search "MyChart", open the app, select [facility], and log in with your MyChart username and password.  Due to Covid, a mask is required upon entering the hospital/clinic. If you do not have a mask, one will be given to you upon arrival. For doctor visits, patients may have 1 support person aged 55 or older with them. For treatment visits, patients cannot have anyone with them due to current Covid guidelines and our immunocompromised population.   Paclitaxel injection What is this medication? PACLITAXEL (PAK li TAX el) is a chemotherapy drug. It targets fast dividing cells, like cancer cells, and causes these cells to die. This medicine is used to treat ovarian cancer, breast cancer, lung cancer, Kaposi's sarcoma, and other cancers. This medicine may be used for other purposes; ask  your health care provider or pharmacist if you have questions. COMMON BRAND NAME(S): Onxol, Taxol What should I tell my care team before I take this medication? They need to know if you have any of these conditions: history of irregular heartbeat liver disease low blood counts, like low  white cell, platelet, or red cell counts lung or breathing disease, like asthma tingling of the fingers or toes, or other nerve disorder an unusual or allergic reaction to paclitaxel, alcohol, polyoxyethylated castor oil, other chemotherapy, other medicines, foods, dyes, or preservatives pregnant or trying to get pregnant breast-feeding How should I use this medication? This drug is given as an infusion into a vein. It is administered in a hospital or clinic by a specially trained health care professional. Talk to your pediatrician regarding the use of this medicine in children. Special care may be needed. Overdosage: If you think you have taken too much of this medicine contact a poison control center or emergency room at once. NOTE: This medicine is only for you. Do not share this medicine with others. What if I miss a dose? It is important not to miss your dose. Call your doctor or health care professional if you are unable to keep an appointment. What may interact with this medication? Do not take this medicine with any of the following medications: live virus vaccines This medicine may also interact with the following medications: antiviral medicines for hepatitis, HIV or AIDS certain antibiotics like erythromycin and clarithromycin certain medicines for fungal infections like ketoconazole and itraconazole certain medicines for seizures like carbamazepine, phenobarbital, phenytoin gemfibrozil nefazodone rifampin St. John's wort This list may not describe all possible interactions. Give your health care provider a list of all the medicines, herbs, non-prescription drugs, or dietary supplements you use. Also tell them if you smoke, drink alcohol, or use illegal drugs. Some items may interact with your medicine. What should I watch for while using this medication? Your condition will be monitored carefully while you are receiving this medicine. You will need important blood work done  while you are taking this medicine. This medicine can cause serious allergic reactions. To reduce your risk you will need to take other medicine(s) before treatment with this medicine. If you experience allergic reactions like skin rash, itching or hives, swelling of the face, lips, or tongue, tell your doctor or health care professional right away. In some cases, you may be given additional medicines to help with side effects. Follow all directions for their use. This drug may make you feel generally unwell. This is not uncommon, as chemotherapy can affect healthy cells as well as cancer cells. Report any side effects. Continue your course of treatment even though you feel ill unless your doctor tells you to stop. Call your doctor or health care professional for advice if you get a fever, chills or sore throat, or other symptoms of a cold or flu. Do not treat yourself. This drug decreases your body's ability to fight infections. Try to avoid being around people who are sick. This medicine may increase your risk to bruise or bleed. Call your doctor or health care professional if you notice any unusual bleeding. Be careful brushing and flossing your teeth or using a toothpick because you may get an infection or bleed more easily. If you have any dental work done, tell your dentist you are receiving this medicine. Avoid taking products that contain aspirin, acetaminophen, ibuprofen, naproxen, or ketoprofen unless instructed by your doctor. These medicines may hide a  fever. Do not become pregnant while taking this medicine. Women should inform their doctor if they wish to become pregnant or think they might be pregnant. There is a potential for serious side effects to an unborn child. Talk to your health care professional or pharmacist for more information. Do not breast-feed an infant while taking this medicine. Men are advised not to father a child while receiving this medicine. This product may contain  alcohol. Ask your pharmacist or healthcare provider if this medicine contains alcohol. Be sure to tell all healthcare providers you are taking this medicine. Certain medicines, like metronidazole and disulfiram, can cause an unpleasant reaction when taken with alcohol. The reaction includes flushing, headache, nausea, vomiting, sweating, and increased thirst. The reaction can last from 30 minutes to several hours. What side effects may I notice from receiving this medication? Side effects that you should report to your doctor or health care professional as soon as possible: allergic reactions like skin rash, itching or hives, swelling of the face, lips, or tongue breathing problems changes in vision fast, irregular heartbeat high or low blood pressure mouth sores pain, tingling, numbness in the hands or feet signs of decreased platelets or bleeding - bruising, pinpoint red spots on the skin, black, tarry stools, blood in the urine signs of decreased red blood cells - unusually weak or tired, feeling faint or lightheaded, falls signs of infection - fever or chills, cough, sore throat, pain or difficulty passing urine signs and symptoms of liver injury like dark yellow or brown urine; general ill feeling or flu-like symptoms; light-colored stools; loss of appetite; nausea; right upper belly pain; unusually weak or tired; yellowing of the eyes or skin swelling of the ankles, feet, hands unusually slow heartbeat Side effects that usually do not require medical attention (report to your doctor or health care professional if they continue or are bothersome): diarrhea hair loss loss of appetite muscle or joint pain nausea, vomiting pain, redness, or irritation at site where injected tiredness This list may not describe all possible side effects. Call your doctor for medical advice about side effects. You may report side effects to FDA at 1-800-FDA-1088. Where should I keep my medication? This drug  is given in a hospital or clinic and will not be stored at home. NOTE: This sheet is a summary. It may not cover all possible information. If you have questions about this medicine, talk to your doctor, pharmacist, or health care provider.  2022 Elsevier/Gold Standard (2021-01-28 00:00:00)

## 2021-06-07 LAB — CULTURE, BLOOD (ROUTINE X 2)
Culture: NO GROWTH
Culture: NO GROWTH
Special Requests: ADEQUATE
Special Requests: ADEQUATE

## 2021-06-08 NOTE — Addendum Note (Signed)
Addended by: Luella Cook on: 06/08/2021 05:52 PM   Modules accepted: Orders

## 2021-06-12 ENCOUNTER — Other Ambulatory Visit: Payer: Self-pay

## 2021-06-12 ENCOUNTER — Encounter: Payer: Self-pay | Admitting: Oncology

## 2021-06-12 ENCOUNTER — Ambulatory Visit
Admission: RE | Admit: 2021-06-12 | Discharge: 2021-06-12 | Disposition: A | Discharge status: Home / Self Care | Payer: Managed Care, Other (non HMO) | Source: Ambulatory Visit | Attending: Nurse Practitioner | Admitting: Nurse Practitioner

## 2021-06-12 DIAGNOSIS — Z170 Estrogen receptor positive status [ER+]: Secondary | ICD-10-CM | POA: Insufficient documentation

## 2021-06-12 DIAGNOSIS — C50911 Malignant neoplasm of unspecified site of right female breast: Secondary | ICD-10-CM | POA: Insufficient documentation

## 2021-06-12 IMAGING — US US BREAST*R* LIMITED INC AXILLA
1 series · 7 of 7 positions shown · non-contrast
Comparison: Previous exam(s).

CLINICAL DATA: 55-year-old female presenting for tumor check status
post neoadjuvant chemotherapy. Patient had biopsy-proven invasive
mammary carcinoma diagnosed in [DATE] with multiple abnormal
right axillary lymph nodes, 1 of which was biopsy demonstrating
metastatic disease.

EXAM:
ULTRASOUND OF THE RIGHT AXILLA

[Series 1: us breast*right* limited inc axilla · 0.07mm/px · 7 of 7 slices shown]
[im 1/7]
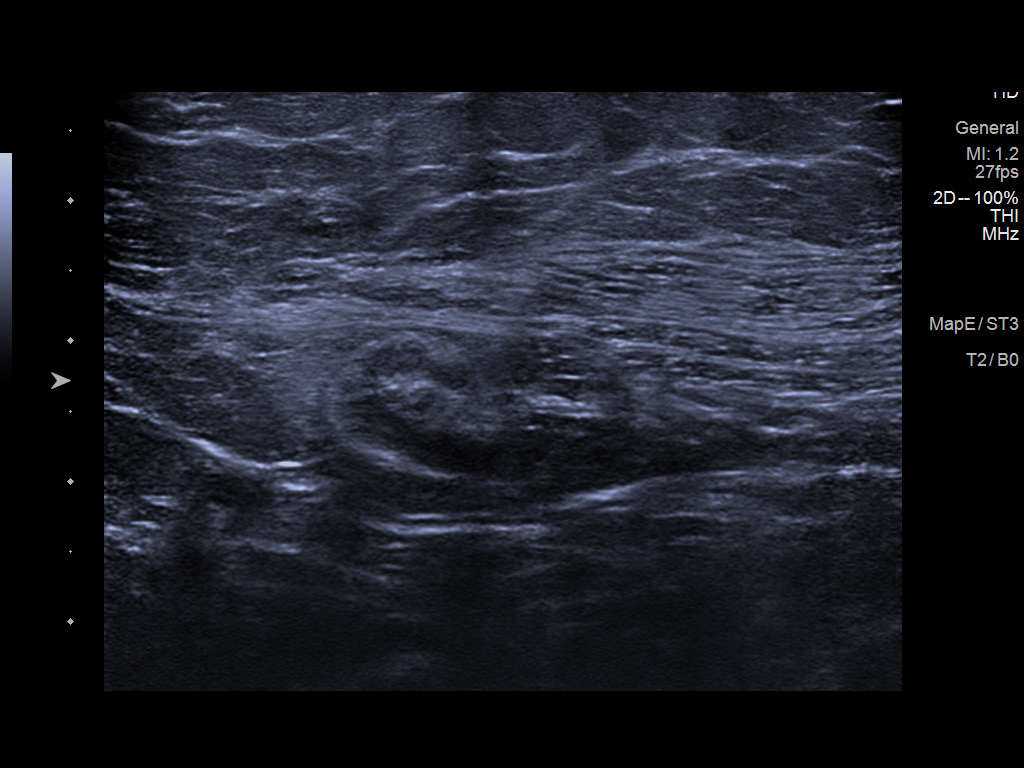
[im 2/7]
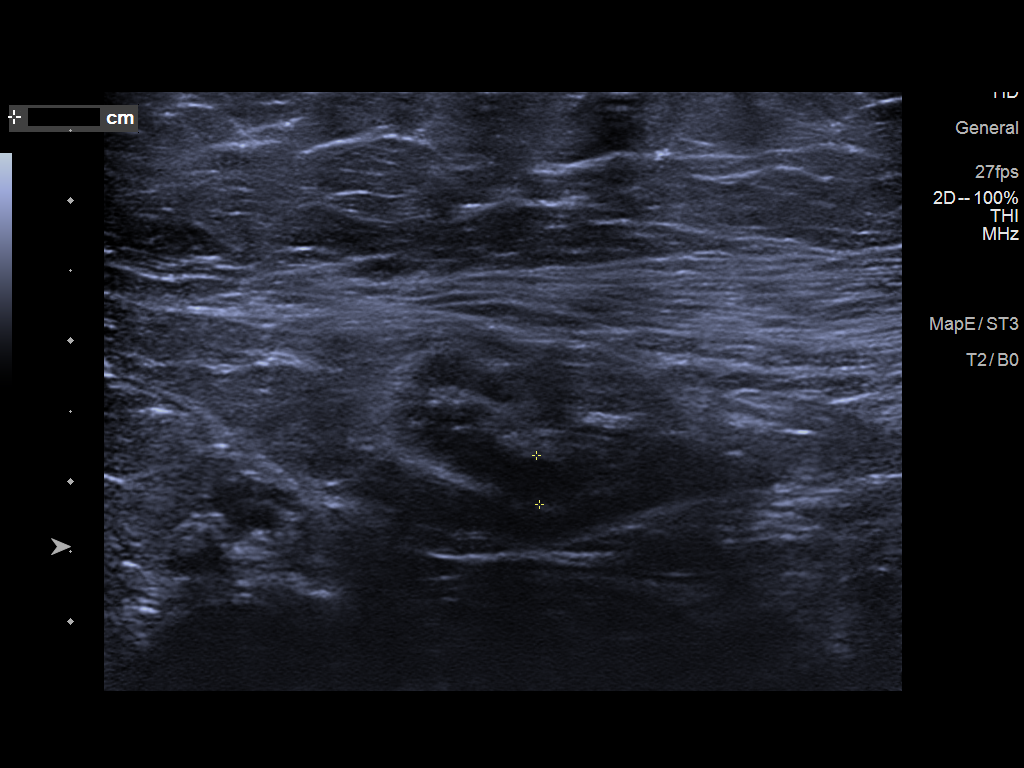
[im 3/7]
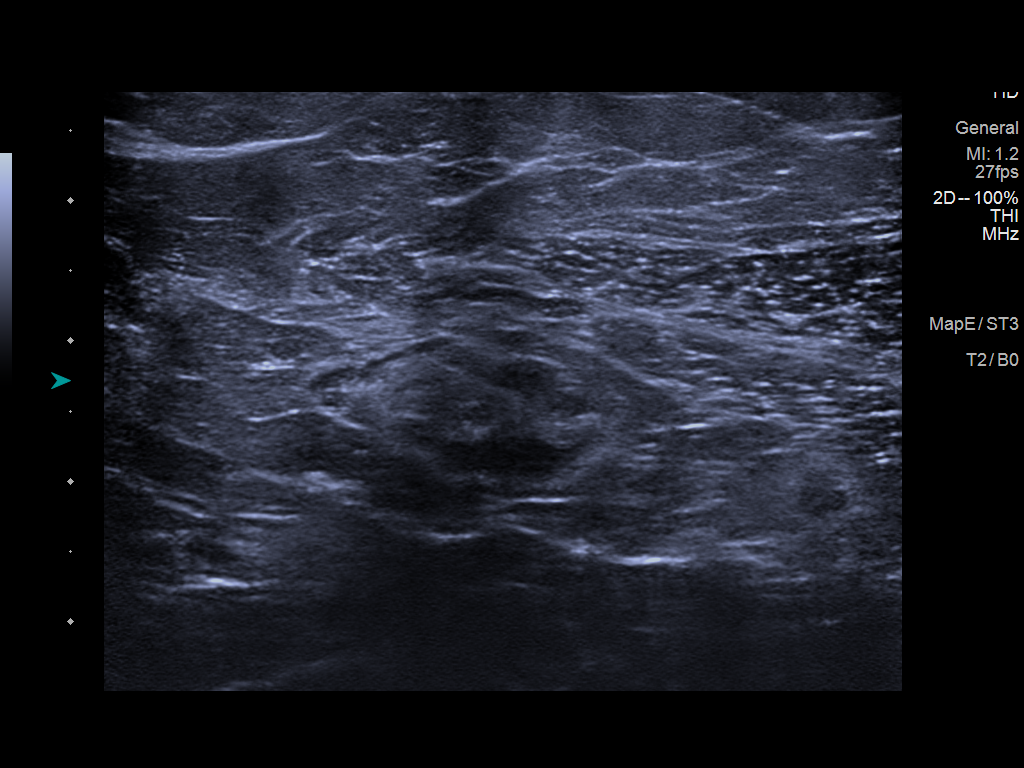
[im 4/7]
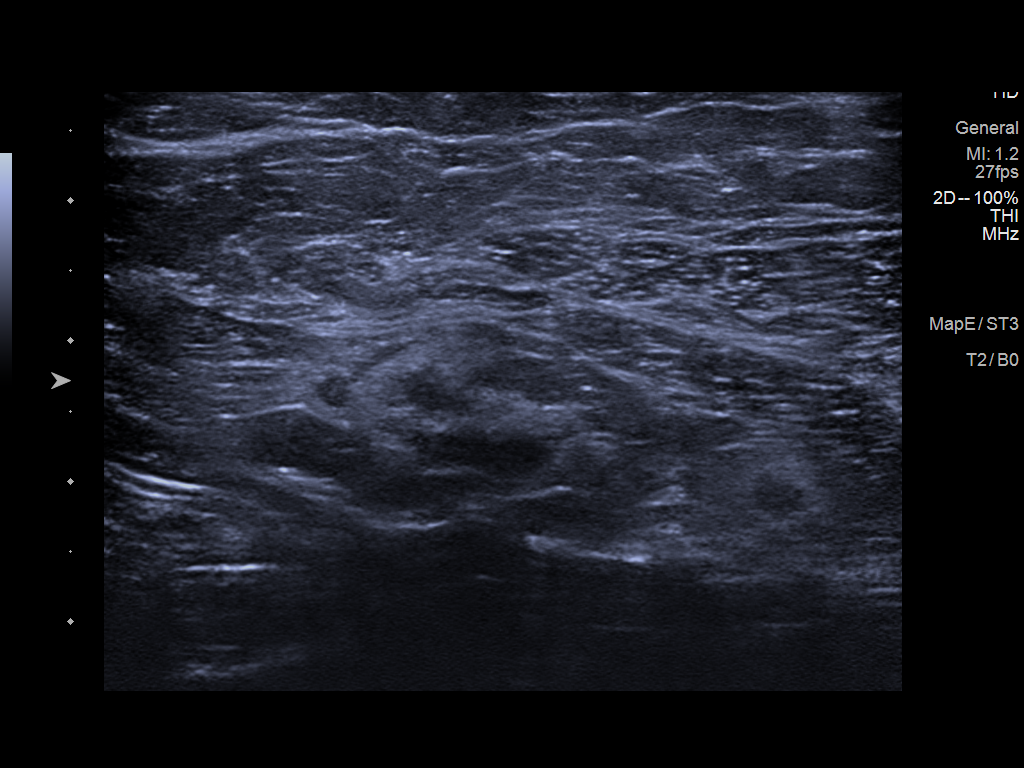
[im 5/7]
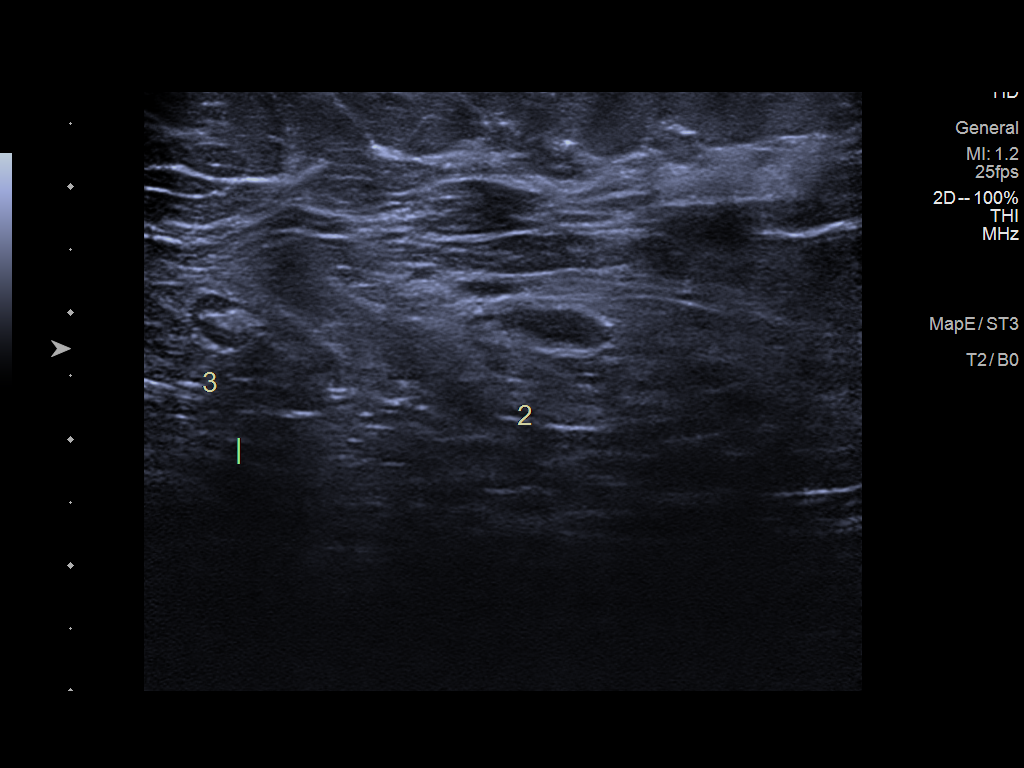
[im 6/7]
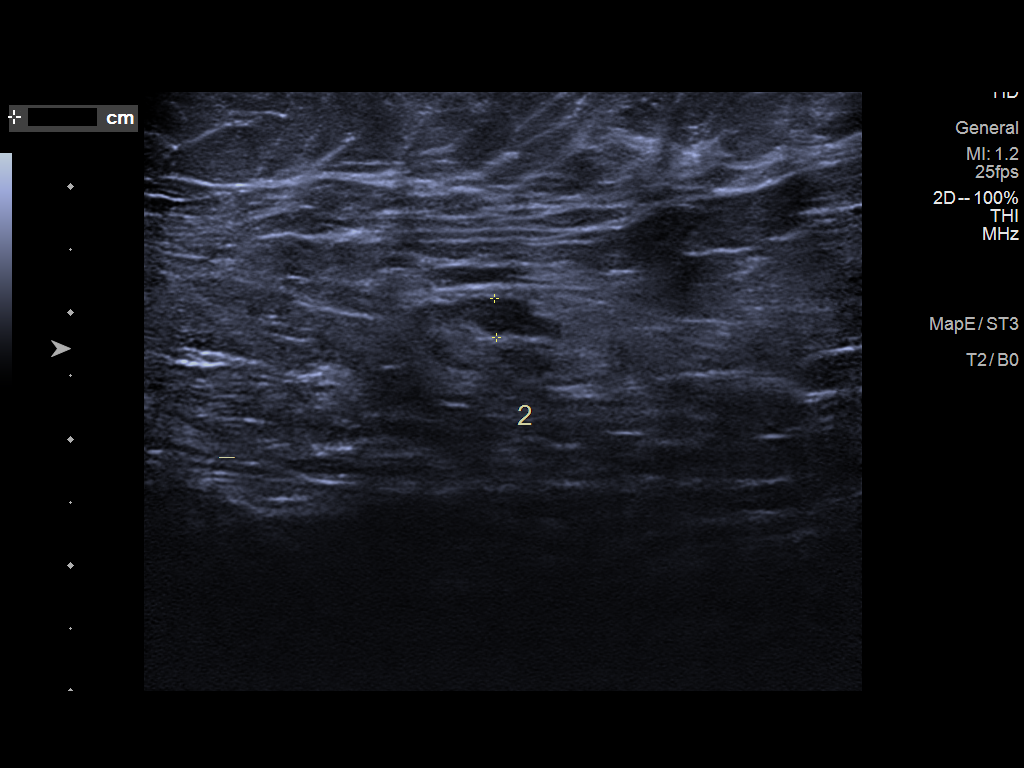
[im 7/7]
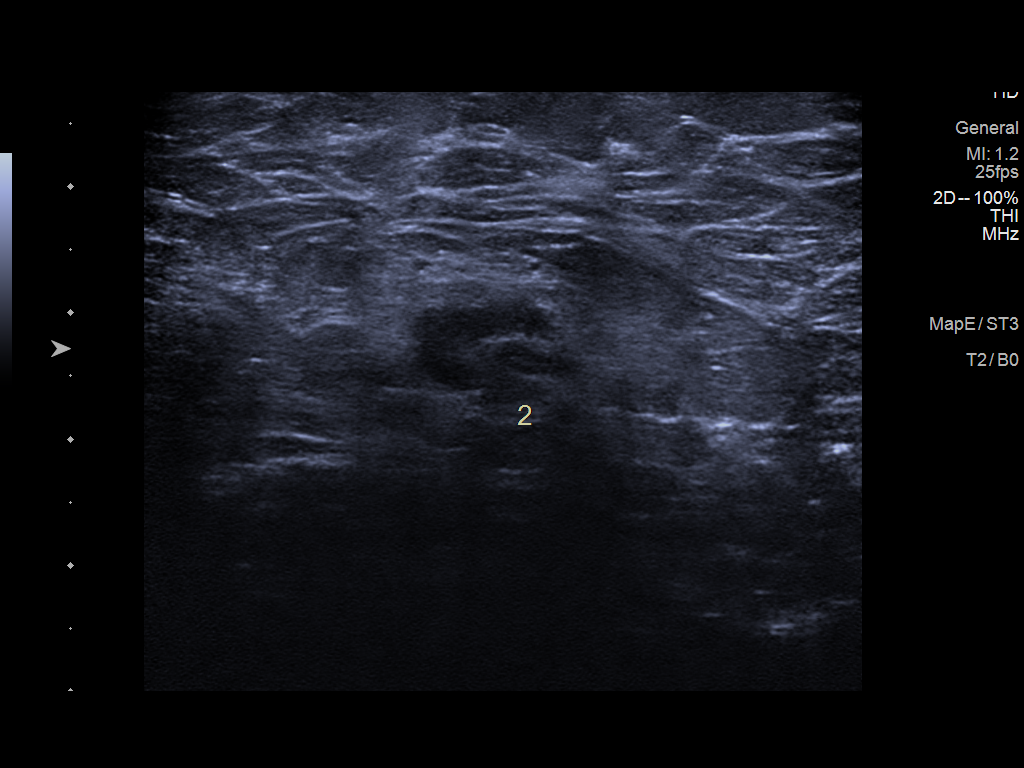

[7 of 7 positions shown; findings below may reference images not displayed]

FINDINGS: Targeted ultrasound is performed throughout the right axilla
demonstrate all normal lymph nodes with normal morphology and
cortical thickness measuring up to 0.4 cm. There is no suspicious
lymph node or mass.
IMPRESSION: Normal right axillary lymph nodes.

RECOMMENDATION:
Continue treatment plan for known right breast cancer. For
evaluation of treatment response in the breast mammogram or MRI is
recommended.

I have discussed the findings and recommendations with the patient.
If applicable, a reminder letter will be sent to the patient
regarding the next appointment.

BI-RADS CATEGORY  2: Benign.

## 2021-06-13 ENCOUNTER — Inpatient Hospital Stay: Payer: Managed Care, Other (non HMO)

## 2021-06-13 VITALS — BP 106/71 | HR 96 | Temp 98.5°F | Resp 18 | Wt 179.9 lb

## 2021-06-13 DIAGNOSIS — Z5111 Encounter for antineoplastic chemotherapy: Secondary | ICD-10-CM | POA: Diagnosis not present

## 2021-06-13 DIAGNOSIS — D649 Anemia, unspecified: Secondary | ICD-10-CM

## 2021-06-13 DIAGNOSIS — C50911 Malignant neoplasm of unspecified site of right female breast: Secondary | ICD-10-CM

## 2021-06-13 LAB — CBC WITH DIFFERENTIAL/PLATELET
Abs Immature Granulocytes: 0.21 10*3/uL — ABNORMAL HIGH (ref 0.00–0.07)
Basophils Absolute: 0.1 10*3/uL (ref 0.0–0.1)
Basophils Relative: 1 %
Eosinophils Absolute: 0 10*3/uL (ref 0.0–0.5)
Eosinophils Relative: 0 %
HCT: 24.6 % — ABNORMAL LOW (ref 36.0–46.0)
Hemoglobin: 7.9 g/dL — ABNORMAL LOW (ref 12.0–15.0)
Immature Granulocytes: 3 %
Lymphocytes Relative: 16 %
Lymphs Abs: 1.1 10*3/uL (ref 0.7–4.0)
MCH: 28.8 pg (ref 26.0–34.0)
MCHC: 32.1 g/dL (ref 30.0–36.0)
MCV: 89.8 fL (ref 80.0–100.0)
Monocytes Absolute: 0.7 10*3/uL (ref 0.1–1.0)
Monocytes Relative: 11 %
Neutro Abs: 4.7 10*3/uL (ref 1.7–7.7)
Neutrophils Relative %: 69 %
Platelets: 375 10*3/uL (ref 150–400)
RBC: 2.74 MIL/uL — ABNORMAL LOW (ref 3.87–5.11)
RDW: 17.2 % — ABNORMAL HIGH (ref 11.5–15.5)
WBC: 6.8 10*3/uL (ref 4.0–10.5)
nRBC: 1.3 % — ABNORMAL HIGH (ref 0.0–0.2)

## 2021-06-13 LAB — COMPREHENSIVE METABOLIC PANEL
ALT: 15 U/L (ref 0–44)
AST: 25 U/L (ref 15–41)
Albumin: 3.5 g/dL (ref 3.5–5.0)
Alkaline Phosphatase: 51 U/L (ref 38–126)
Anion gap: 8 (ref 5–15)
BUN: 16 mg/dL (ref 6–20)
CO2: 25 mmol/L (ref 22–32)
Calcium: 9 mg/dL (ref 8.9–10.3)
Chloride: 105 mmol/L (ref 98–111)
Creatinine, Ser: 0.8 mg/dL (ref 0.44–1.00)
GFR, Estimated: >60 mL/min (ref >60)
Glucose, Bld: 122 mg/dL — ABNORMAL HIGH (ref 70–99)
Potassium: 3.8 mmol/L (ref 3.5–5.1)
Sodium: 138 mmol/L (ref 135–145)
Total Bilirubin: 0.4 mg/dL (ref 0.3–1.2)
Total Protein: 6.2 g/dL — ABNORMAL LOW (ref 6.5–8.1)

## 2021-06-13 LAB — SAMPLE TO BLOOD BANK

## 2021-06-13 MED ORDER — DIPHENHYDRAMINE HCL 50 MG/ML IJ SOLN
50.0000 mg | Freq: Once | INTRAMUSCULAR | Status: AC
  Administered 2021-06-13: 50 mg via INTRAVENOUS
  Filled 2021-06-13: qty 1

## 2021-06-13 MED ORDER — SODIUM CHLORIDE 0.9 % IV SOLN
80.0000 mg/m2 | Freq: Once | INTRAVENOUS | Status: AC
  Administered 2021-06-13: 156 mg via INTRAVENOUS
  Filled 2021-06-13: qty 26

## 2021-06-13 MED ORDER — SODIUM CHLORIDE 0.9 % IV SOLN
Freq: Once | INTRAVENOUS | Status: AC
  Filled 2021-06-13: qty 250

## 2021-06-13 MED ORDER — SODIUM CHLORIDE 0.9 % IV SOLN
10.0000 mg | Freq: Once | INTRAVENOUS | Status: AC
  Administered 2021-06-13: 10 mg via INTRAVENOUS
  Filled 2021-06-13: qty 10

## 2021-06-13 MED ORDER — FAMOTIDINE IN NACL 20-0.9 MG/50ML-% IV SOLN
20.0000 mg | Freq: Once | INTRAVENOUS | Status: AC
  Administered 2021-06-13: 20 mg via INTRAVENOUS
  Filled 2021-06-13: qty 50

## 2021-06-13 MED ORDER — HEPARIN SOD (PORK) LOCK FLUSH 100 UNIT/ML IV SOLN
INTRAVENOUS | Status: AC
  Administered 2021-06-13: 500 [IU]
  Filled 2021-06-13: qty 5

## 2021-06-13 MED ORDER — SODIUM CHLORIDE 0.9% FLUSH
10.0000 mL | INTRAVENOUS | Status: DC | PRN
  Administered 2021-06-13: 10 mL
  Filled 2021-06-13: qty 10

## 2021-06-13 MED ORDER — HEPARIN SOD (PORK) LOCK FLUSH 100 UNIT/ML IV SOLN
500.0000 [IU] | Freq: Once | INTRAVENOUS | Status: AC | PRN
  Filled 2021-06-13: qty 5

## 2021-06-13 NOTE — Progress Notes (Signed)
0905- Hemoglobin: 7.9 today. Patient reports feeling better this week. Patient denies fatigue and shortness of breath. MD, Dr. Janese Banks, notified and aware. Per MD order: blood transfusion is not needed today; okay to proceed with scheduled Taxol treatment only today.

## 2021-06-13 NOTE — Patient Instructions (Signed)
[facility] AT [place]   Discharge Instructions: Thank you for choosing [facility] to provide your oncology and hematology care.  If you have a lab appointment with the [facility], please go directly to the [facility] and check in at the registration area.   Wear comfortable clothing and clothing appropriate for easy access to any Portacath or PICC line.   We strive to give you quality time with your provider. You may need to reschedule your appointment if you arrive late (15 or more minutes).  Arriving late affects you and other patients whose appointments are after yours.  Also, if you miss three or more appointments without notifying the office, you may be dismissed from the clinic at the providers discretion.      For prescription refill requests, have your pharmacy contact our office and allow 72 hours for refills to be completed.    Today you received the following chemotherapy and/or immunotherapy agents: Taxol.      To help prevent nausea and vomiting after your treatment, we encourage you to take your nausea medication as directed.  BELOW ARE SYMPTOMS THAT SHOULD BE REPORTED IMMEDIATELY: *FEVER GREATER THAN 100.4 F (38 C) OR HIGHER *CHILLS OR SWEATING *NAUSEA AND VOMITING THAT IS NOT CONTROLLED WITH YOUR NAUSEA MEDICATION *UNUSUAL SHORTNESS OF BREATH *UNUSUAL BRUISING OR BLEEDING *URINARY PROBLEMS (pain or burning when urinating, or frequent urination) *BOWEL PROBLEMS (unusual diarrhea, constipation, pain near the anus) TENDERNESS IN MOUTH AND THROAT WITH OR WITHOUT PRESENCE OF ULCERS (sore throat, sores in mouth, or a toothache) UNUSUAL RASH, SWELLING OR PAIN  UNUSUAL VAGINAL DISCHARGE OR ITCHING   Items with * indicate a potential emergency and should be followed up as soon as possible or go to the Emergency Department if any problems should occur.  Please show the CHEMOTHERAPY ALERT CARD or IMMUNOTHERAPY ALERT CARD at check-in to  the Emergency Department and triage nurse.  Should you have questions after your visit or need to cancel or reschedule your appointment, please contact [facility] AT [place]  Dept: [phone]  and follow the prompts.  Office hours are 8:00 a.m. to 4:30 p.m. Monday - Friday. Please note that voicemails left after 4:00 p.m. may not be returned until the following business day.  We are closed weekends and major holidays. You have access to a nurse at all times for urgent questions. Please call the main number to the clinic Dept: [phone] and follow the prompts.  For any non-urgent questions, you may also contact your provider using MyChart. We now offer e-Visits for anyone 12 and older to request care online for non-urgent symptoms. For details visit [email or web address].   Also download the MyChart app! Go to the app store, search "MyChart", open the app, select [facility], and log in with your MyChart username and password.  Due to Covid, a mask is required upon entering the hospital/clinic. If you do not have a mask, one will be given to you upon arrival. For doctor visits, patients may have 1 support person aged 71 or older with them. For treatment visits, patients cannot have anyone with them due to current Covid guidelines and our immunocompromised population.

## 2021-06-17 ENCOUNTER — Other Ambulatory Visit: Payer: Self-pay | Admitting: *Deleted

## 2021-06-17 DIAGNOSIS — R928 Other abnormal and inconclusive findings on diagnostic imaging of breast: Secondary | ICD-10-CM

## 2021-06-17 DIAGNOSIS — Z170 Estrogen receptor positive status [ER+]: Secondary | ICD-10-CM

## 2021-06-17 NOTE — Progress Notes (Signed)
yes

## 2021-06-17 NOTE — Progress Notes (Signed)
Moving forward with bilateral breast MRI?

## 2021-06-20 ENCOUNTER — Encounter: Payer: Self-pay | Admitting: Oncology

## 2021-06-20 ENCOUNTER — Inpatient Hospital Stay: Payer: Managed Care, Other (non HMO)

## 2021-06-20 ENCOUNTER — Other Ambulatory Visit: Payer: Self-pay

## 2021-06-20 ENCOUNTER — Inpatient Hospital Stay (HOSPITAL_BASED_OUTPATIENT_CLINIC_OR_DEPARTMENT_OTHER): Payer: Managed Care, Other (non HMO) | Admitting: Oncology

## 2021-06-20 VITALS — BP 109/62 | HR 92 | Temp 98.7°F | Wt 182.7 lb

## 2021-06-20 VITALS — BP 111/62 | HR 88

## 2021-06-20 DIAGNOSIS — C50911 Malignant neoplasm of unspecified site of right female breast: Secondary | ICD-10-CM

## 2021-06-20 DIAGNOSIS — T451X5A Adverse effect of antineoplastic and immunosuppressive drugs, initial encounter: Secondary | ICD-10-CM | POA: Diagnosis not present

## 2021-06-20 DIAGNOSIS — Z5111 Encounter for antineoplastic chemotherapy: Secondary | ICD-10-CM

## 2021-06-20 DIAGNOSIS — D6481 Anemia due to antineoplastic chemotherapy: Secondary | ICD-10-CM

## 2021-06-20 DIAGNOSIS — Z170 Estrogen receptor positive status [ER+]: Secondary | ICD-10-CM

## 2021-06-20 LAB — COMPREHENSIVE METABOLIC PANEL
ALT: 17 U/L (ref 0–44)
AST: 26 U/L (ref 15–41)
Albumin: 3.4 g/dL — ABNORMAL LOW (ref 3.5–5.0)
Alkaline Phosphatase: 51 U/L (ref 38–126)
Anion gap: 9 (ref 5–15)
BUN: 18 mg/dL (ref 6–20)
CO2: 22 mmol/L (ref 22–32)
Calcium: 8.8 mg/dL — ABNORMAL LOW (ref 8.9–10.3)
Chloride: 106 mmol/L (ref 98–111)
Creatinine, Ser: 0.72 mg/dL (ref 0.44–1.00)
GFR, Estimated: >60 mL/min (ref >60)
Glucose, Bld: 101 mg/dL — ABNORMAL HIGH (ref 70–99)
Potassium: 3.7 mmol/L (ref 3.5–5.1)
Sodium: 137 mmol/L (ref 135–145)
Total Bilirubin: 0.4 mg/dL (ref 0.3–1.2)
Total Protein: 6.1 g/dL — ABNORMAL LOW (ref 6.5–8.1)

## 2021-06-20 LAB — CBC WITH DIFFERENTIAL/PLATELET
Abs Immature Granulocytes: 0.07 10*3/uL (ref 0.00–0.07)
Basophils Absolute: 0.1 10*3/uL (ref 0.0–0.1)
Basophils Relative: 1 %
Eosinophils Absolute: 0.2 10*3/uL (ref 0.0–0.5)
Eosinophils Relative: 6 %
HCT: 24.6 % — ABNORMAL LOW (ref 36.0–46.0)
Hemoglobin: 7.6 g/dL — ABNORMAL LOW (ref 12.0–15.0)
Immature Granulocytes: 2 %
Lymphocytes Relative: 22 %
Lymphs Abs: 0.9 10*3/uL (ref 0.7–4.0)
MCH: 28.5 pg (ref 26.0–34.0)
MCHC: 30.9 g/dL (ref 30.0–36.0)
MCV: 92.1 fL (ref 80.0–100.0)
Monocytes Absolute: 0.4 10*3/uL (ref 0.1–1.0)
Monocytes Relative: 10 %
Neutro Abs: 2.5 10*3/uL (ref 1.7–7.7)
Neutrophils Relative %: 59 %
Platelets: 207 10*3/uL (ref 150–400)
RBC: 2.67 MIL/uL — ABNORMAL LOW (ref 3.87–5.11)
RDW: 18.9 % — ABNORMAL HIGH (ref 11.5–15.5)
WBC: 4.2 10*3/uL (ref 4.0–10.5)
nRBC: 0.7 % — ABNORMAL HIGH (ref 0.0–0.2)

## 2021-06-20 MED ORDER — DIPHENHYDRAMINE HCL 50 MG/ML IJ SOLN
50.0000 mg | Freq: Once | INTRAMUSCULAR | Status: AC
  Administered 2021-06-20: 50 mg via INTRAVENOUS
  Filled 2021-06-20: qty 1

## 2021-06-20 MED ORDER — SODIUM CHLORIDE 0.9 % IV SOLN
80.0000 mg/m2 | Freq: Once | INTRAVENOUS | Status: AC
  Administered 2021-06-20: 156 mg via INTRAVENOUS
  Filled 2021-06-20: qty 26

## 2021-06-20 MED ORDER — HEPARIN SOD (PORK) LOCK FLUSH 100 UNIT/ML IV SOLN
500.0000 [IU] | Freq: Once | INTRAVENOUS | Status: AC | PRN
  Administered 2021-06-20: 500 [IU]
  Filled 2021-06-20: qty 5

## 2021-06-20 MED ORDER — FAMOTIDINE IN NACL 20-0.9 MG/50ML-% IV SOLN
20.0000 mg | Freq: Once | INTRAVENOUS | Status: AC
  Administered 2021-06-20: 20 mg via INTRAVENOUS
  Filled 2021-06-20: qty 50

## 2021-06-20 MED ORDER — SODIUM CHLORIDE 0.9 % IV SOLN
10.0000 mg | Freq: Once | INTRAVENOUS | Status: AC
  Administered 2021-06-20: 10 mg via INTRAVENOUS
  Filled 2021-06-20: qty 10

## 2021-06-20 MED ORDER — SODIUM CHLORIDE 0.9 % IV SOLN
Freq: Once | INTRAVENOUS | Status: AC
  Filled 2021-06-20: qty 250

## 2021-06-20 NOTE — Progress Notes (Signed)
HGB 7.6. Ok to proceed with treatment, per Dr. Janese Banks.

## 2021-06-20 NOTE — Progress Notes (Signed)
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  This was followed by diagnostic mammogram and ultrasound.  Mammogram showed extensive suspicious pleomorphic calcifications measuring up to 6.8 cm.  2 abnormal lymph nodes were seen in the right axilla on ultrasound as well.  Both the calcifications and the lymph node was biopsied.  Anterior and posterior end of the calcifications was consistent with invasive mammary carcinoma grade 3.  Lymph node was positive for metastatic carcinoma as well.  ER 91 to 100% positive PR 41 to 50% positive and HER2 negative.  Ki-67 40%   Menarche at the age of 15.  She is G2, P2 L2.  Age at first birth 78.  She used birth control pills for about 8 to 10 years.  She had a hysterectomy at the age of 66 but ovaries are still in situ.  Family history significant for breast cancer in her mother and paternal aunt.  Father with metastatic bladder cancer and maternal grandfather with pancreatic cancer.  Patient had BRCA 1 and 2 testing done in the past and was reportedly negative.   MRI bilateral breasts showed non-mass enhancement involving the upper outer lower outer and upper inner quadrants finding 10.9 x 8.4  x 6.8 cm.  Non-mass enhancement in the left breast spanning 1.6 x 1.3 x 0.8 cm.  6 metastatic right axillary lymph nodes.   CT chest abdomen and pelvis with contrast showed mild right axillary adenopathy compatible with metastatic disease but no evidence of distant metastatic disease.  5 mm left lower lobe nodule.  Bone scan was negative for metastatic disease.    Interval history-she mild fatigue but overall feels better as compared to when she was receiving [facility] chemotherapy.  She is able to do her household chores and does not report any exertional shortness of breath.  Denies any tingling numbness in her extremities.  ECOG PS- 1 Pain scale- 0   Review of systems- Review of Systems  Constitutional:  Positive for malaise/fatigue. Negative for chills, fever and weight loss.  HENT:  Negative for congestion, ear discharge and nosebleeds.   Eyes:  Negative for blurred vision.  Respiratory:  Negative for cough, hemoptysis, sputum production, shortness of breath and wheezing.   Cardiovascular:  Negative for chest pain, palpitations, orthopnea and claudication.  Gastrointestinal:  Negative for abdominal pain, blood in stool, constipation, diarrhea, heartburn, melena, nausea and vomiting.  Genitourinary:  Negative for dysuria, flank pain, frequency, hematuria and urgency.  Musculoskeletal:  Negative for back pain, joint pain and myalgias.  Skin:  Negative for rash.  Neurological:  Negative for dizziness, tingling, focal weakness, seizures, weakness and headaches.  Endo/Heme/Allergies:  Does not bruise/bleed easily.  Psychiatric/Behavioral:  Negative for depression and suicidal ideas. The patient does not have insomnia.      Allergies  Allergen Reactions  Tape     Redness and burning     Past Medical History:  Diagnosis Date   Anemia    Benign neoplasm of breast 2013   left breast   BRCA negative 2015   Breast cancer ([place])    Cancer ([place])    Family history of bladder cancer     Family history of breast cancer    Family history of malignant neoplasm of breast 2013   Family history of pancreatic cancer    Screening for obesity      Past Surgical History:  Procedure Laterality Date   ABDOMINAL HYSTERECTOMY  2011   partial   BREAST BIOPSY Left 2013   BREAST BIOPSY Right 03/14/2021   Stereo bx-anterior calcs, "coil" clip-path pending   BREAST BIOPSY Right 03/14/2021   stereo bx-calcs, "Ribbon" clip-path pending   breast biopsy Right 03/14/2021   [place] Bx, Axilla, path pending   BREAST BIOPSY Left 04/30/2021   BREAST SURGERY Left 10/16/2011   left breast finesse biopsy   COLONOSCOPY WITH PROPOFOL N/A 12/05/2014   Procedure: COLONOSCOPY WITH PROPOFOL;  Surgeon: Christene Lye, MD;  Location: [facility] ENDOSCOPY;  Service: Endoscopy;  Laterality: N/A;   DILATION AND CURETTAGE OF UTERUS     IR CV LINE INJECTION  05/06/2021   IR IMAGING GUIDED PORT INSERTION  05/08/2021   PORTACATH PLACEMENT N/A 04/02/2021   Procedure: INSERTION PORT-A-CATH;  Surgeon: Herbert Pun, MD;  Location: [facility] ORS;  Service: General;  Laterality: N/A;    Social History   Socioeconomic History   Marital status: Married    Spouse name: Legrand Como   Number of children: Not on file   Years of education: Not on file   Highest education level: Not on file  Occupational History   Not on file  Tobacco Use   Smoking status: Never   Smokeless tobacco: Never  Vaping Use   Vaping Use: Never used  Substance and Sexual Activity   Alcohol use: No   Drug use: No   Sexual activity: Not on file  Other Topics Concern   Not on file  Social History Narrative   Not on file   Social Determinants of Health   Financial Resource Strain: Not on file  Food Insecurity: Not on file  Transportation Needs: Not on file  Physical Activity: Not on file  Stress: Not on file  Social Connections: Not on file  Intimate Partner Violence: Not on file    Family History  Problem Relation Age of  Onset   Breast cancer Mother 73   Bladder Cancer Father 32   Breast cancer Paternal Aunt 42   Pancreatic cancer Paternal Uncle 13   Pancreatic cancer Maternal Grandfather 73   Cancer Cousin        in ear     Current Outpatient Medications:    acetaminophen (TYLENOL) 500 MG tablet, Take 1,000 mg by mouth every 6 (six) hours as needed for moderate pain., Disp: , Rfl:    calcium-vitamin D (OSCAL WITH D) 500-5 MG-MCG tablet, Take 1 tablet by mouth daily with breakfast., Disp: 90 tablet, Rfl: 2   lidocaine-prilocaine (EMLA) cream, Apply to affected area once, Disp: 30 g, Rfl: 3   amoxicillin-clavulanate (AUGMENTIN) 875-125 MG tablet, Take 1 tablet by mouth 2 (two) times daily. (Patient not taking: Reported on 06/20/2021), Disp: 14 tablet, Rfl: 0   LORazepam (ATIVAN) 0.5 MG tablet, Take 1 tablet (0.5 mg total) by mouth every 6 (six) hours as needed (Nausea or vomiting). (Patient  not taking: Reported on 04/07/2021), Disp: 30 tablet, Rfl: 0   ondansetron (ZOFRAN) 8 MG tablet, Take 1 tablet (8 mg total) by mouth 2 (two) times daily as needed. Start on the third day after chemotherapy. (Patient not taking: Reported on 04/07/2021), Disp: 30 tablet, Rfl: 1   prochlorperazine (COMPAZINE) 10 MG tablet, Take 1 tablet (10 mg total) by mouth every 6 (six) hours as needed (Nausea or vomiting). (Patient not taking: Reported on 04/07/2021), Disp: 30 tablet, Rfl: 1   triamcinolone ointment (KENALOG) 0.5 %, Apply 1 application topically 2 (two) times daily. OVER THE PORT SITE FOR REDNESS (Patient not taking: Reported on 05/23/2021), Disp: 30 g, Rfl: 0   ZIEXTENZO 6 MG/0.6ML injection, INJECT THE CONTENTS OF ONE SYRINGE (6 MG) UNDER THE SKIN 24 TO 72 HOURS AFTER CHEMO EVERY 14 DAYS (Patient not taking: Reported on 06/06/2021), Disp: 0.6 mL, Rfl: 1 No current facility-administered medications for this visit.  Facility-Administered Medications Ordered in Other Visits:    PACLitaxel (TAXOL) 156 mg in sodium chloride 0.9  % 250 mL chemo infusion (</= 56m/m2), 80 mg/m2 (Order-Specific), Intravenous, Once, RSindy Guadeloupe MD, Last Rate: 276 mL/hr at 06/20/21 1107, 156 mg at 06/20/21 1107  Physical exam:  Vitals:   06/20/21 0907  BP: 109/62  Pulse: 92  Temp: 98.7 F (37.1 C)  SpO2: (!) 20%  Weight: 182 lb 11.2 oz (82.9 kg)  PF: 100 L/min   Physical Exam Constitutional:      General: She is not in acute distress. Cardiovascular:     Rate and Rhythm: Normal rate and regular rhythm.     Heart sounds: Normal heart sounds.  Pulmonary:     Effort: Pulmonary effort is normal.     Breath sounds: Normal breath sounds.  Skin:    General: Skin is warm and dry.  Neurological:     Mental Status: She is alert and oriented to person, place, and time.     CMP Latest Ref Rng & Units 06/20/2021  Glucose 70 - 99 mg/dL 101(H)  BUN 6 - 20 mg/dL 18  Creatinine 0.44 - 1.00 mg/dL 0.72  Sodium 135 - 145 mmol/L 137  Potassium 3.5 - 5.1 mmol/L 3.7  Chloride 98 - 111 mmol/L 106  CO2 22 - 32 mmol/L 22  Calcium 8.9 - 10.3 mg/dL 8.8(L)  Total Protein 6.5 - 8.1 g/dL 6.1(L)  Total Bilirubin 0.3 - 1.2 mg/dL 0.4  Alkaline Phos 38 - 126 U/L 51  AST 15 - 41 U/L 26  ALT 0 - 44 U/L 17   CBC Latest Ref Rng & Units 06/20/2021  WBC 4.0 - 10.5 K/uL 4.2  Hemoglobin 12.0 - 15.0 g/dL 7.6(L)  Hematocrit 36.0 - 46.0 % 24.6(L)  Platelets 150 - 400 K/uL 207    No images are attached to the encounter.  DG Chest 2 View  Result Date: 06/02/2021 CLINICAL DATA:  Fever, cough. EXAM: CHEST - 2 VIEW COMPARISON:  April 02, 2021. FINDINGS: The heart size and mediastinal contours are within normal limits. Both lungs are clear. Left internal jugular Port-A-Cath is unchanged in position. The visualized skeletal structures are unremarkable. IMPRESSION: No active cardiopulmonary disease. Electronically Signed   By: JMarijo ConceptionM.D.   On: 06/02/2021 13:35   UK[place]Breast Limited Uni Right Inc Axilla  Result Date: 06/12/2021 CLINICAL DATA:   56year old female presenting for tumor check status post neoadjuvant chemotherapy. Patient had biopsy-proven invasive mammary carcinoma diagnosed in October 22 with multiple abnormal right axillary lymph  nodes, 1 of which was biopsy demonstrating metastatic disease. EXAM: ULTRASOUND OF THE RIGHT AXILLA COMPARISON:  Previous exam(s). FINDINGS: Targeted ultrasound is performed throughout the right axilla demonstrate all normal lymph nodes with normal morphology and cortical thickness measuring up to 0.4 cm. There is no suspicious lymph node or mass. IMPRESSION: Normal right axillary lymph nodes. RECOMMENDATION: Continue treatment plan for known right breast cancer. For evaluation of treatment response in the breast mammogram or MRI is recommended. I have discussed the findings and recommendations with the patient. If applicable, a reminder letter will be sent to the patient regarding the next appointment. BI-RADS CATEGORY  2: Benign. Electronically Signed   By: Audie Pinto M.D.   On: 06/12/2021 11:21    Assessment and plan- Patient is a 55 y.o. female  with newly diagnosed clinical prognostic stage IIIa invasive mammary carcinoma of the right breast cT3 N1 M0 ER/PR positive HER2 negative.  She is s/p 4 cycles of neoadjuvant dose dense AC chemotherapy.  She is here for on treatment assessment prior to cycle 3 of weekly Taxol chemotherapy  Patient had extensive disease spanning an area of 10.9 cm of calcifications noted on her MRI as well as metastases to 6 axillary lymph nodes.  Her interim ultrasound after neoadjuvant chemotherapy shows normal-appearing axillary lymph nodes but does not give [place] much information about the abnormal calcifications.  Clinically we do not palpate adenopathy or breast mass.  I will therefore proceed with an interim ultrasound at this time which she will have in the next week to 10 days.  Counts are otherwise okay to proceed with cycle 3 of weekly Taxol chemotherapy today.  She  does have significant chemo induced anemia which has remained stable around 7.5 stopped since starting Taxol chemotherapy.  It is no better or no worse.  Anemia work-up including iron studies B12 folate have all been unremarkable.  Her energy levels are actually improving as compared to when she was on [facility] chemotherapy.  I will therefore continue to hold off on blood transfusion at this time.  She will directly proceed for cycle 4 of Taxol chemotherapy next week and I will see her back in 2 weeks for cycle 5.   Visit Diagnosis 1. Encounter for antineoplastic chemotherapy   2. Antineoplastic chemotherapy induced anemia   3. Breast cancer, stage 1, estrogen receptor positive, right ([place])      Dr. Randa Evens, MD, MPH [facility] at [facility] [phone] 06/20/2021 11:26 AM

## 2021-06-27 ENCOUNTER — Inpatient Hospital Stay: Payer: Managed Care, Other (non HMO)

## 2021-06-27 ENCOUNTER — Other Ambulatory Visit: Payer: Self-pay

## 2021-06-27 ENCOUNTER — Inpatient Hospital Stay: Payer: Managed Care, Other (non HMO) | Attending: Oncology

## 2021-06-27 VITALS — BP 119/60 | HR 85 | Temp 98.2°F | Resp 18 | Ht 65.0 in | Wt 185.2 lb

## 2021-06-27 DIAGNOSIS — R5383 Other fatigue: Secondary | ICD-10-CM | POA: Insufficient documentation

## 2021-06-27 DIAGNOSIS — C50812 Malignant neoplasm of overlapping sites of left female breast: Secondary | ICD-10-CM | POA: Diagnosis present

## 2021-06-27 DIAGNOSIS — Z803 Family history of malignant neoplasm of breast: Secondary | ICD-10-CM | POA: Insufficient documentation

## 2021-06-27 DIAGNOSIS — Z86018 Personal history of other benign neoplasm: Secondary | ICD-10-CM | POA: Insufficient documentation

## 2021-06-27 DIAGNOSIS — Z170 Estrogen receptor positive status [ER+]: Secondary | ICD-10-CM

## 2021-06-27 DIAGNOSIS — Z5111 Encounter for antineoplastic chemotherapy: Secondary | ICD-10-CM | POA: Diagnosis not present

## 2021-06-27 DIAGNOSIS — D6481 Anemia due to antineoplastic chemotherapy: Secondary | ICD-10-CM | POA: Diagnosis not present

## 2021-06-27 DIAGNOSIS — Z800 Family history of malignant neoplasm of digestive organs: Secondary | ICD-10-CM | POA: Insufficient documentation

## 2021-06-27 DIAGNOSIS — Z8052 Family history of malignant neoplasm of bladder: Secondary | ICD-10-CM | POA: Diagnosis not present

## 2021-06-27 DIAGNOSIS — Z79899 Other long term (current) drug therapy: Secondary | ICD-10-CM | POA: Insufficient documentation

## 2021-06-27 DIAGNOSIS — Z888 Allergy status to other drugs, medicaments and biological substances status: Secondary | ICD-10-CM | POA: Insufficient documentation

## 2021-06-27 DIAGNOSIS — Z808 Family history of malignant neoplasm of other organs or systems: Secondary | ICD-10-CM | POA: Insufficient documentation

## 2021-06-27 DIAGNOSIS — C773 Secondary and unspecified malignant neoplasm of axilla and upper limb lymph nodes: Secondary | ICD-10-CM | POA: Diagnosis not present

## 2021-06-27 DIAGNOSIS — T451X5A Adverse effect of antineoplastic and immunosuppressive drugs, initial encounter: Secondary | ICD-10-CM | POA: Diagnosis not present

## 2021-06-27 DIAGNOSIS — C50911 Malignant neoplasm of unspecified site of right female breast: Secondary | ICD-10-CM

## 2021-06-27 LAB — COMPREHENSIVE METABOLIC PANEL
ALT: 20 U/L (ref 0–44)
AST: 25 U/L (ref 15–41)
Albumin: 3.5 g/dL (ref 3.5–5.0)
Alkaline Phosphatase: 51 U/L (ref 38–126)
Anion gap: 6 (ref 5–15)
BUN: 14 mg/dL (ref 6–20)
CO2: 25 mmol/L (ref 22–32)
Calcium: 8.5 mg/dL — ABNORMAL LOW (ref 8.9–10.3)
Chloride: 104 mmol/L (ref 98–111)
Creatinine, Ser: 0.79 mg/dL (ref 0.44–1.00)
GFR, Estimated: >60 mL/min (ref >60)
Glucose, Bld: 114 mg/dL — ABNORMAL HIGH (ref 70–99)
Potassium: 3.8 mmol/L (ref 3.5–5.1)
Sodium: 135 mmol/L (ref 135–145)
Total Bilirubin: 0.5 mg/dL (ref 0.3–1.2)
Total Protein: 6.2 g/dL — ABNORMAL LOW (ref 6.5–8.1)

## 2021-06-27 LAB — CBC WITH DIFFERENTIAL/PLATELET
Abs Immature Granulocytes: 0.08 10*3/uL — ABNORMAL HIGH (ref 0.00–0.07)
Basophils Absolute: 0 10*3/uL (ref 0.0–0.1)
Basophils Relative: 1 %
Eosinophils Absolute: 0.2 10*3/uL (ref 0.0–0.5)
Eosinophils Relative: 6 %
HCT: 23 % — ABNORMAL LOW (ref 36.0–46.0)
Hemoglobin: 7.3 g/dL — ABNORMAL LOW (ref 12.0–15.0)
Immature Granulocytes: 2 %
Lymphocytes Relative: 21 %
Lymphs Abs: 0.8 10*3/uL (ref 0.7–4.0)
MCH: 29.6 pg (ref 26.0–34.0)
MCHC: 31.7 g/dL (ref 30.0–36.0)
MCV: 93.1 fL (ref 80.0–100.0)
Monocytes Absolute: 0.4 10*3/uL (ref 0.1–1.0)
Monocytes Relative: 10 %
Neutro Abs: 2.2 10*3/uL (ref 1.7–7.7)
Neutrophils Relative %: 60 %
Platelets: 214 10*3/uL (ref 150–400)
RBC: 2.47 MIL/uL — ABNORMAL LOW (ref 3.87–5.11)
RDW: 20 % — ABNORMAL HIGH (ref 11.5–15.5)
WBC: 3.8 10*3/uL — ABNORMAL LOW (ref 4.0–10.5)
nRBC: 1.9 % — ABNORMAL HIGH (ref 0.0–0.2)

## 2021-06-27 MED ORDER — DIPHENHYDRAMINE HCL 50 MG/ML IJ SOLN
50.0000 mg | Freq: Once | INTRAMUSCULAR | Status: AC
  Administered 2021-06-27: 50 mg via INTRAVENOUS
  Filled 2021-06-27: qty 1

## 2021-06-27 MED ORDER — SODIUM CHLORIDE 0.9 % IV SOLN
10.0000 mg | Freq: Once | INTRAVENOUS | Status: AC
  Administered 2021-06-27: 10 mg via INTRAVENOUS
  Filled 2021-06-27: qty 10

## 2021-06-27 MED ORDER — FAMOTIDINE IN NACL 20-0.9 MG/50ML-% IV SOLN
20.0000 mg | Freq: Once | INTRAVENOUS | Status: AC
  Administered 2021-06-27: 20 mg via INTRAVENOUS
  Filled 2021-06-27: qty 50

## 2021-06-27 MED ORDER — HEPARIN SOD (PORK) LOCK FLUSH 100 UNIT/ML IV SOLN
500.0000 [IU] | Freq: Once | INTRAVENOUS | Status: AC | PRN
  Administered 2021-06-27: 500 [IU]
  Filled 2021-06-27: qty 5

## 2021-06-27 MED ORDER — SODIUM CHLORIDE 0.9 % IV SOLN
80.0000 mg/m2 | Freq: Once | INTRAVENOUS | Status: AC
  Administered 2021-06-27: 156 mg via INTRAVENOUS
  Filled 2021-06-27: qty 26

## 2021-06-27 MED ORDER — SODIUM CHLORIDE 0.9 % IV SOLN
Freq: Once | INTRAVENOUS | Status: AC
  Filled 2021-06-27: qty 250

## 2021-06-27 NOTE — Patient Instructions (Signed)
[facility] CANCER CTR AT [place]  Discharge Instructions: Thank you for choosing [facility] to provide your oncology and hematology care.  If you have a lab appointment with the [facility], please go directly to the [facility] and check in at the registration area.  Wear comfortable clothing and clothing appropriate for easy access to any Portacath or PICC line.   We strive to give you quality time with your provider. You may need to reschedule your appointment if you arrive late (15 or more minutes).  Arriving late affects you and other patients whose appointments are after yours.  Also, if you miss three or more appointments without notifying the office, you may be dismissed from the clinic at the providers discretion.      For prescription refill requests, have your pharmacy contact our office and allow 72 hours for refills to be completed.    Today you received the following chemotherapy and/or immunotherapy agents TAXOL       To help prevent nausea and vomiting after your treatment, we encourage you to take your nausea medication as directed.  BELOW ARE SYMPTOMS THAT SHOULD BE REPORTED IMMEDIATELY: *FEVER GREATER THAN 100.4 F (38 C) OR HIGHER *CHILLS OR SWEATING *NAUSEA AND VOMITING THAT IS NOT CONTROLLED WITH YOUR NAUSEA MEDICATION *UNUSUAL SHORTNESS OF BREATH *UNUSUAL BRUISING OR BLEEDING *URINARY PROBLEMS (pain or burning when urinating, or frequent urination) *BOWEL PROBLEMS (unusual diarrhea, constipation, pain near the anus) TENDERNESS IN MOUTH AND THROAT WITH OR WITHOUT PRESENCE OF ULCERS (sore throat, sores in mouth, or a toothache) UNUSUAL RASH, SWELLING OR PAIN  UNUSUAL VAGINAL DISCHARGE OR ITCHING   Items with * indicate a potential emergency and should be followed up as soon as possible or go to the Emergency Department if any problems should occur.  Please show the CHEMOTHERAPY ALERT CARD or IMMUNOTHERAPY ALERT CARD at check-in to the  Emergency Department and triage nurse.  Should you have questions after your visit or need to cancel or reschedule your appointment, please contact [facility] CANCER [place] AT [place]  [phone] and follow the prompts.  Office hours are 8:00 a.m. to 4:30 p.m. Monday - Friday. Please note that voicemails left after 4:00 p.m. may not be returned until the following business day.  We are closed weekends and major holidays. You have access to a nurse at all times for urgent questions. Please call the main number to the clinic [phone] and follow the prompts.  For any non-urgent questions, you may also contact your provider using MyChart. We now offer e-Visits for anyone 27 and older to request care online for non-urgent symptoms. For details visit [email or web address].   Also download the MyChart app! Go to the app store, search "MyChart", open the app, select [facility], and log in with your MyChart username and password.  Due to Covid, a mask is required upon entering the hospital/clinic. If you do not have a mask, one will be given to you upon arrival. For doctor visits, patients may have 1 support person aged 2 or older with them. For treatment visits, patients cannot have anyone with them due to current Covid guidelines and our immunocompromised population.   Paclitaxel injection What is this medication? PACLITAXEL (PAK li TAX el) is a chemotherapy drug. It targets fast dividing cells, like cancer cells, and causes these cells to die. This medicine is used to treat ovarian cancer, breast cancer, lung cancer, Kaposi's sarcoma, and other cancers. This medicine may be used for other purposes;  ask your health care provider or pharmacist if you have questions. COMMON BRAND NAME(S): Onxol, Taxol What should I tell my care team before I take this medication? They need to know if you have any of these conditions: history of irregular heartbeat liver disease low blood counts, like low  white cell, platelet, or red cell counts lung or breathing disease, like asthma tingling of the fingers or toes, or other nerve disorder an unusual or allergic reaction to paclitaxel, alcohol, polyoxyethylated castor oil, other chemotherapy, other medicines, foods, dyes, or preservatives pregnant or trying to get pregnant breast-feeding How should I use this medication? This drug is given as an infusion into a vein. It is administered in a hospital or clinic by a specially trained health care professional. Talk to your pediatrician regarding the use of this medicine in children. Special care may be needed. Overdosage: If you think you have taken too much of this medicine contact a poison control center or emergency room at once. NOTE: This medicine is only for you. Do not share this medicine with others. What if I miss a dose? It is important not to miss your dose. Call your doctor or health care professional if you are unable to keep an appointment. What may interact with this medication? Do not take this medicine with any of the following medications: live virus vaccines This medicine may also interact with the following medications: antiviral medicines for hepatitis, HIV or AIDS certain antibiotics like erythromycin and clarithromycin certain medicines for fungal infections like ketoconazole and itraconazole certain medicines for seizures like carbamazepine, phenobarbital, phenytoin gemfibrozil nefazodone rifampin St. John's wort This list may not describe all possible interactions. Give your health care provider a list of all the medicines, herbs, non-prescription drugs, or dietary supplements you use. Also tell them if you smoke, drink alcohol, or use illegal drugs. Some items may interact with your medicine. What should I watch for while using this medication? Your condition will be monitored carefully while you are receiving this medicine. You will need important blood work done  while you are taking this medicine. This medicine can cause serious allergic reactions. To reduce your risk you will need to take other medicine(s) before treatment with this medicine. If you experience allergic reactions like skin rash, itching or hives, swelling of the face, lips, or tongue, tell your doctor or health care professional right away. In some cases, you may be given additional medicines to help with side effects. Follow all directions for their use. This drug may make you feel generally unwell. This is not uncommon, as chemotherapy can affect healthy cells as well as cancer cells. Report any side effects. Continue your course of treatment even though you feel ill unless your doctor tells you to stop. Call your doctor or health care professional for advice if you get a fever, chills or sore throat, or other symptoms of a cold or flu. Do not treat yourself. This drug decreases your body's ability to fight infections. Try to avoid being around people who are sick. This medicine may increase your risk to bruise or bleed. Call your doctor or health care professional if you notice any unusual bleeding. Be careful brushing and flossing your teeth or using a toothpick because you may get an infection or bleed more easily. If you have any dental work done, tell your dentist you are receiving this medicine. Avoid taking products that contain aspirin, acetaminophen, ibuprofen, naproxen, or ketoprofen unless instructed by your doctor. These medicines may hide  a fever. Do not become pregnant while taking this medicine. Women should inform their doctor if they wish to become pregnant or think they might be pregnant. There is a potential for serious side effects to an unborn child. Talk to your health care professional or pharmacist for more information. Do not breast-feed an infant while taking this medicine. Men are advised not to father a child while receiving this medicine. This product may contain  alcohol. Ask your pharmacist or healthcare provider if this medicine contains alcohol. Be sure to tell all healthcare providers you are taking this medicine. Certain medicines, like metronidazole and disulfiram, can cause an unpleasant reaction when taken with alcohol. The reaction includes flushing, headache, nausea, vomiting, sweating, and increased thirst. The reaction can last from 30 minutes to several hours. What side effects may I notice from receiving this medication? Side effects that you should report to your doctor or health care professional as soon as possible: allergic reactions like skin rash, itching or hives, swelling of the face, lips, or tongue breathing problems changes in vision fast, irregular heartbeat high or low blood pressure mouth sores pain, tingling, numbness in the hands or feet signs of decreased platelets or bleeding - bruising, pinpoint red spots on the skin, black, tarry stools, blood in the urine signs of decreased red blood cells - unusually weak or tired, feeling faint or lightheaded, falls signs of infection - fever or chills, cough, sore throat, pain or difficulty passing urine signs and symptoms of liver injury like dark yellow or brown urine; general ill feeling or flu-like symptoms; light-colored stools; loss of appetite; nausea; right upper belly pain; unusually weak or tired; yellowing of the eyes or skin swelling of the ankles, feet, hands unusually slow heartbeat Side effects that usually do not require medical attention (report to your doctor or health care professional if they continue or are bothersome): diarrhea hair loss loss of appetite muscle or joint pain nausea, vomiting pain, redness, or irritation at site where injected tiredness This list may not describe all possible side effects. Call your doctor for medical advice about side effects. You may report side effects to FDA at 1-800-FDA-1088. Where should I keep my medication? This drug  is given in a hospital or clinic and will not be stored at home. NOTE: This sheet is a summary. It may not cover all possible information. If you have questions about this medicine, talk to your doctor, pharmacist, or health care provider.  2022 Elsevier/Gold Standard (2021-01-28 00:00:00)

## 2021-06-27 NOTE — Progress Notes (Signed)
Per Dr Janese Banks- ok to proceed with Hgb 7.3

## 2021-06-30 ENCOUNTER — Ambulatory Visit
Admission: RE | Admit: 2021-06-30 | Discharge: 2021-06-30 | Disposition: A | Discharge status: Home / Self Care | Payer: Managed Care, Other (non HMO) | Source: Ambulatory Visit | Attending: Oncology | Admitting: Oncology

## 2021-06-30 DIAGNOSIS — Z170 Estrogen receptor positive status [ER+]: Secondary | ICD-10-CM | POA: Insufficient documentation

## 2021-06-30 DIAGNOSIS — R928 Other abnormal and inconclusive findings on diagnostic imaging of breast: Secondary | ICD-10-CM | POA: Insufficient documentation

## 2021-06-30 DIAGNOSIS — C50911 Malignant neoplasm of unspecified site of right female breast: Secondary | ICD-10-CM | POA: Diagnosis present

## 2021-06-30 IMAGING — MR MR BREAST BILAT WO/W CM
2 of 9 series · 6 of 48 positions shown · IV contrast (7.5ml Gadavist)
Comparison: [DATE] breast MR and prior studies

CLINICAL DATA: 55-year-old female for evaluation of RIGHT breast
cancer neoadjuvant treatment response. History of benign LEFT breast
biopsy.

EXAM:
BILATERAL BREAST MRI WITH AND WITHOUT CONTRAST
TECHNIQUE: Multiplanar, multisequence MR images of both breasts were obtained
prior to and following the intravenous administration of 7.5 ml of
Gadavist

[Series 2: T1 · axial · B · 1.5mm · 1.02mm/px · z∈[-61,+106]mm · 5 of 112 slices shown]
[im 1/112]
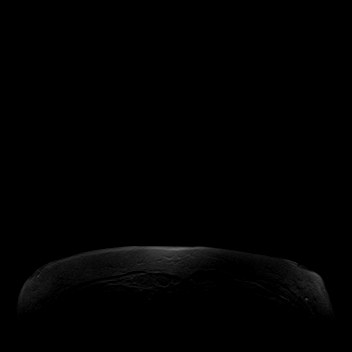
[im 28/112]
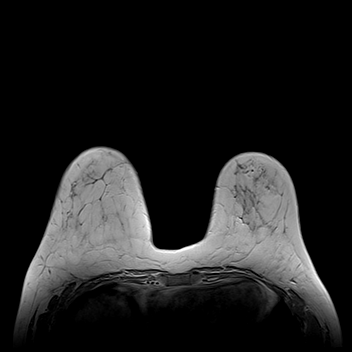
[im 56/112]
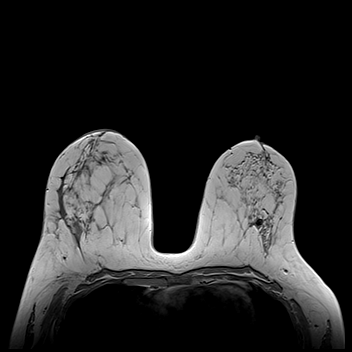
[im 84/112]
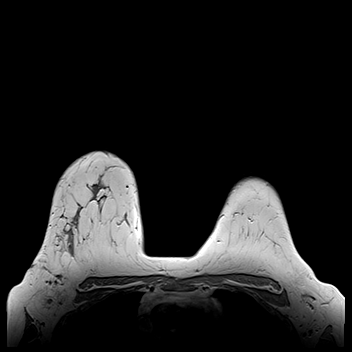
[im 112/112]
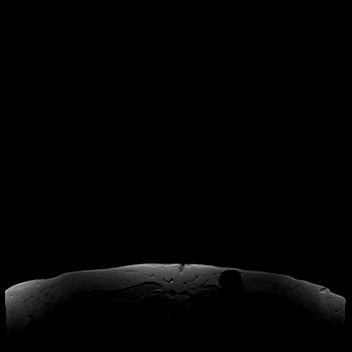

[Series 3: T2 · axial · B · 3.0mm · 1.02mm/px · 1 of 45 slices shown]
[im 1/45]
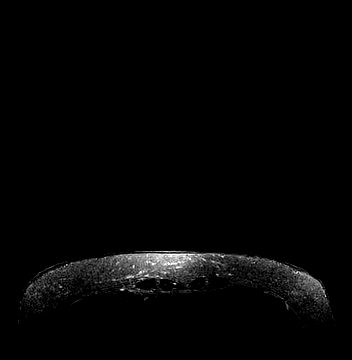

[6 of 48 positions shown; findings below may reference images not displayed]

Three-dimensional MR images were rendered by post-processing of the
original MR data on an independent workstation. The
three-dimensional MR images were interpreted, and findings are
reported in the following complete MRI report for this study. Three
dimensional images were evaluated at the independent interpreting
workstation using the DynaCAD thin client.
FINDINGS: Breast composition: c. Heterogeneous fibroglandular tissue.

Background parenchymal enhancement: Minimal

Right breast: Non masslike enhancement within the UPPER OUTER, LOWER
OUTER and UPPER INNER RIGHT breast again identified. Although the
area involved is unchanged measuring 10.9 x 8.4 x 6.8 cm, there is
significantly decreased enhancement and confluency/solid tumor
volume. Biopsy clip artifact within the LOWER OUTER RIGHT breast
again noted.

No new or increasing suspicious enhancement/mass is noted.

Left breast: Biopsy-proven non masslike enhancement within the LOWER
OUTER LEFT breast is again noted.

Lymph nodes: No abnormal appearing lymph nodes.

Ancillary findings:  None.
IMPRESSION: 1. Treatment response of known RIGHT breast malignancy. Although the
measured area unchanged, there is significantly decreased
enhancement and confluency/solid tumor volume.
2. Previously identified abnormal RIGHT axillary lymph nodes, now
all have a normal appearance.
3. No new or progressive disease identified.

RECOMMENDATION:
Treatment plan

BI-RADS CATEGORY  6: Known biopsy-proven malignancy.

## 2021-06-30 MED ORDER — GADOBUTROL 1 MMOL/ML IV SOLN
7.5000 mL | Freq: Once | INTRAVENOUS | Status: AC | PRN
  Administered 2021-06-30: 7.5 mL via INTRAVENOUS

## 2021-07-04 ENCOUNTER — Inpatient Hospital Stay: Payer: Managed Care, Other (non HMO)

## 2021-07-04 ENCOUNTER — Inpatient Hospital Stay (HOSPITAL_BASED_OUTPATIENT_CLINIC_OR_DEPARTMENT_OTHER): Payer: Managed Care, Other (non HMO) | Admitting: Oncology

## 2021-07-04 ENCOUNTER — Other Ambulatory Visit: Payer: Self-pay

## 2021-07-04 ENCOUNTER — Encounter: Payer: Self-pay | Admitting: Oncology

## 2021-07-04 VITALS — BP 119/51 | HR 99 | Temp 98.7°F | Resp 16 | Wt 183.0 lb

## 2021-07-04 DIAGNOSIS — D701 Agranulocytosis secondary to cancer chemotherapy: Secondary | ICD-10-CM

## 2021-07-04 DIAGNOSIS — C50911 Malignant neoplasm of unspecified site of right female breast: Secondary | ICD-10-CM | POA: Diagnosis not present

## 2021-07-04 DIAGNOSIS — Z170 Estrogen receptor positive status [ER+]: Secondary | ICD-10-CM

## 2021-07-04 DIAGNOSIS — D6481 Anemia due to antineoplastic chemotherapy: Secondary | ICD-10-CM | POA: Diagnosis not present

## 2021-07-04 DIAGNOSIS — Z5111 Encounter for antineoplastic chemotherapy: Secondary | ICD-10-CM

## 2021-07-04 DIAGNOSIS — T451X5A Adverse effect of antineoplastic and immunosuppressive drugs, initial encounter: Secondary | ICD-10-CM

## 2021-07-04 LAB — COMPREHENSIVE METABOLIC PANEL
ALT: 18 U/L (ref 0–44)
AST: 25 U/L (ref 15–41)
Albumin: 3.4 g/dL — ABNORMAL LOW (ref 3.5–5.0)
Alkaline Phosphatase: 51 U/L (ref 38–126)
Anion gap: 6 (ref 5–15)
BUN: 13 mg/dL (ref 6–20)
CO2: 26 mmol/L (ref 22–32)
Calcium: 9 mg/dL (ref 8.9–10.3)
Chloride: 106 mmol/L (ref 98–111)
Creatinine, Ser: 0.67 mg/dL (ref 0.44–1.00)
GFR, Estimated: >60 mL/min (ref >60)
Glucose, Bld: 136 mg/dL — ABNORMAL HIGH (ref 70–99)
Potassium: 3.4 mmol/L — ABNORMAL LOW (ref 3.5–5.1)
Sodium: 138 mmol/L (ref 135–145)
Total Bilirubin: 0.2 mg/dL — ABNORMAL LOW (ref 0.3–1.2)
Total Protein: 6.4 g/dL — ABNORMAL LOW (ref 6.5–8.1)

## 2021-07-04 LAB — CBC WITH DIFFERENTIAL/PLATELET
Abs Immature Granulocytes: 0.05 10*3/uL (ref 0.00–0.07)
Basophils Absolute: 0 10*3/uL (ref 0.0–0.1)
Basophils Relative: 1 %
Eosinophils Absolute: 0.2 10*3/uL (ref 0.0–0.5)
Eosinophils Relative: 7 %
HCT: 25 % — ABNORMAL LOW (ref 36.0–46.0)
Hemoglobin: 7.8 g/dL — ABNORMAL LOW (ref 12.0–15.0)
Immature Granulocytes: 2 %
Lymphocytes Relative: 18 %
Lymphs Abs: 0.6 10*3/uL — ABNORMAL LOW (ref 0.7–4.0)
MCH: 30 pg (ref 26.0–34.0)
MCHC: 31.2 g/dL (ref 30.0–36.0)
MCV: 96.2 fL (ref 80.0–100.0)
Monocytes Absolute: 0.3 10*3/uL (ref 0.1–1.0)
Monocytes Relative: 11 %
Neutro Abs: 1.9 10*3/uL (ref 1.7–7.7)
Neutrophils Relative %: 61 %
Platelets: 253 10*3/uL (ref 150–400)
RBC: 2.6 MIL/uL — ABNORMAL LOW (ref 3.87–5.11)
RDW: 19.9 % — ABNORMAL HIGH (ref 11.5–15.5)
WBC: 3.1 10*3/uL — ABNORMAL LOW (ref 4.0–10.5)
nRBC: 1.3 % — ABNORMAL HIGH (ref 0.0–0.2)

## 2021-07-04 MED ORDER — SODIUM CHLORIDE 0.9 % IV SOLN
Freq: Once | INTRAVENOUS | Status: AC
  Filled 2021-07-04: qty 250

## 2021-07-04 MED ORDER — FAMOTIDINE IN NACL 20-0.9 MG/50ML-% IV SOLN
20.0000 mg | Freq: Once | INTRAVENOUS | Status: AC
  Administered 2021-07-04: 20 mg via INTRAVENOUS
  Filled 2021-07-04: qty 50

## 2021-07-04 MED ORDER — HEPARIN SOD (PORK) LOCK FLUSH 100 UNIT/ML IV SOLN
500.0000 [IU] | Freq: Once | INTRAVENOUS | Status: AC | PRN
  Administered 2021-07-04: 500 [IU]
  Filled 2021-07-04: qty 5

## 2021-07-04 MED ORDER — SODIUM CHLORIDE 0.9 % IV SOLN
10.0000 mg | Freq: Once | INTRAVENOUS | Status: AC
  Administered 2021-07-04: 10 mg via INTRAVENOUS
  Filled 2021-07-04: qty 10

## 2021-07-04 MED ORDER — DIPHENHYDRAMINE HCL 50 MG/ML IJ SOLN
50.0000 mg | Freq: Once | INTRAMUSCULAR | Status: AC
  Administered 2021-07-04: 50 mg via INTRAVENOUS
  Filled 2021-07-04: qty 1

## 2021-07-04 MED ORDER — SODIUM CHLORIDE 0.9 % IV SOLN
80.0000 mg/m2 | Freq: Once | INTRAVENOUS | Status: AC
  Administered 2021-07-04: 156 mg via INTRAVENOUS
  Filled 2021-07-04: qty 26

## 2021-07-04 NOTE — Progress Notes (Signed)
Hematology/Oncology Consult note [facility]  [phone] [phone]  Patient Care Team: Derinda Late, MD as PCP - General (Family Medicine) Sindy Guadeloupe, MD as Consulting Physician (Hematology and Oncology)   Name of the patient: Emma Cuevas  [id number]  1966/02/05   Date of visit: 07/04/21  Diagnosis-  clinical prognostic stage IIIa right breast cancer T3 N1 M0 ER/PR positive HER2 negative    Chief complaint/ Reason for visit-on treatment assessment prior to cycle 5 of weekly Taxol chemotherapy  Heme/Onc history:  Patient is a 56 year old female who underwent a routine bilateral screening mammogram in September 2022 which showed calcifications in the right breast and prominent right axillary lymph node concerning for malignancy.  This was followed by diagnostic mammogram and ultrasound.  Mammogram showed extensive suspicious pleomorphic calcifications measuring up to 6.8 cm.  2 abnormal lymph nodes were seen in the right axilla on ultrasound as well.  Both the calcifications and the lymph node was biopsied.  Anterior and posterior end of the calcifications was consistent with invasive mammary carcinoma grade 3.  Lymph node was positive for metastatic carcinoma as well.  ER 91 to 100% positive PR 41 to 50% positive and HER2 negative.  Ki-67 40%   Menarche at the age of 27.  She is G2, P2 L2.  Age at first birth 62.  She used birth control pills for about 8 to 10 years.  She had a hysterectomy at the age of 21 but ovaries are still in situ.  Family history significant for breast cancer in her mother and paternal aunt.  Father with metastatic bladder cancer and maternal grandfather with pancreatic cancer.  Patient had BRCA 1 and 2 testing done in the past and was reportedly negative.   MRI bilateral breasts showed non-mass enhancement involving the upper outer lower outer and upper inner quadrants finding 10.9 x 8.4 x 6.8 cm.  Non-mass  enhancement in the left breast spanning 1.6 x 1.3 x 0.8 cm.  6 metastatic right axillary lymph nodes.   CT chest abdomen and pelvis with contrast showed mild right axillary adenopathy compatible with metastatic disease but no evidence of distant metastatic disease.  5 mm left lower lobe nodule.  Bone scan was negative for metastatic disease. Patient is receiving neoadjuvant chemotherapy with dose dense AC followed by weekly Taxol.  Interim MRI has shown good response to treatment as evidenced by normalization of the axillary lymph nodes as well as decreased enhancement uncalled fluency of tumor volume  Interval history-other than ongoing fatigue patient is tolerating chemotherapy well.  She denies any tingling numbness in her hands and feet.  She has noticed changes in her big toe nail of the left foot.  Denies other complaints at this time  ECOG PS- 1 Pain scale- 0   Review of systems- Review of Systems  Constitutional:  Positive for malaise/fatigue. Negative for chills, fever and weight loss.  HENT:  Negative for congestion, ear discharge and nosebleeds.   Eyes:  Negative for blurred vision.  Respiratory:  Negative for cough, hemoptysis, sputum production, shortness of breath and wheezing.   Cardiovascular:  Negative for chest pain, palpitations, orthopnea and claudication.  Gastrointestinal:  Negative for abdominal pain, blood in stool, constipation, diarrhea, heartburn, melena, nausea and vomiting.  Genitourinary:  Negative for dysuria, flank pain, frequency, hematuria and urgency.  Musculoskeletal:  Negative for back pain, joint pain and myalgias.  Skin:  Negative for rash.  Neurological:  Negative for dizziness, tingling,  focal weakness, seizures, weakness and headaches.  Endo/Heme/Allergies:  Does not bruise/bleed easily.  Psychiatric/Behavioral:  Negative for depression and suicidal ideas. The patient does not have insomnia.      Allergies  Allergen Reactions   Tape     Redness  and burning     Past Medical History:  Diagnosis Date   Anemia    Benign neoplasm of breast 2013   left breast   BRCA negative 2015   Breast cancer ([place])    Cancer ([place])    Family history of bladder cancer    Family history of breast cancer    Family history of malignant neoplasm of breast 2013   Family history of pancreatic cancer    Screening for obesity      Past Surgical History:  Procedure Laterality Date   ABDOMINAL HYSTERECTOMY  2011   partial   BREAST BIOPSY Left 2013   BREAST BIOPSY Right 03/14/2021   Stereo bx-anterior calcs, "coil" clip-path pending   BREAST BIOPSY Right 03/14/2021   stereo bx-calcs, "Ribbon" clip-path pending   breast biopsy Right 03/14/2021   [place] Bx, Axilla, path pending   BREAST BIOPSY Left 04/30/2021   BREAST SURGERY Left 10/16/2011   left breast finesse biopsy   COLONOSCOPY WITH PROPOFOL N/A 12/05/2014   Procedure: COLONOSCOPY WITH PROPOFOL;  Surgeon: Christene Lye, MD;  Location: [facility] ENDOSCOPY;  Service: Endoscopy;  Laterality: N/A;   DILATION AND CURETTAGE OF UTERUS     IR CV LINE INJECTION  05/06/2021   IR IMAGING GUIDED PORT INSERTION  05/08/2021   PORTACATH PLACEMENT N/A 04/02/2021   Procedure: INSERTION PORT-A-CATH;  Surgeon: Herbert Pun, MD;  Location: [facility] ORS;  Service: General;  Laterality: N/A;    Social History   Socioeconomic History   Marital status: Married    Spouse name: Legrand Como   Number of children: Not on file   Years of education: Not on file   Highest education level: Not on file  Occupational History   Not on file  Tobacco Use   Smoking status: Never   Smokeless tobacco: Never  Vaping Use   Vaping Use: Never used  Substance and Sexual Activity   Alcohol use: No   Drug use: No   Sexual activity: Not on file  Other Topics Concern   Not on file  Social History Narrative   Not on file   Social Determinants of Health   Financial Resource Strain: Not on file  Food Insecurity: Not  on file  Transportation Needs: Not on file  Physical Activity: Not on file  Stress: Not on file  Social Connections: Not on file  Intimate Partner Violence: Not on file    Family History  Problem Relation Age of Onset   Breast cancer Mother 49   Bladder Cancer Father 6   Breast cancer Paternal Aunt 62   Pancreatic cancer Paternal Uncle 68   Pancreatic cancer Maternal Grandfather 30   Cancer Cousin        in ear     Current Outpatient Medications:    Calcium Carb-Cholecalciferol 500-10 MG-MCG TABS, Take 1 tablet by mouth every morning., Disp: , Rfl:    lidocaine-prilocaine (EMLA) cream, Apply to affected area once, Disp: 30 g, Rfl: 3   ZIEXTENZO 6 MG/0.6ML injection, INJECT THE CONTENTS OF ONE SYRINGE (6 MG) UNDER THE SKIN 24 TO 72 HOURS AFTER CHEMO EVERY 14 DAYS, Disp: 0.6 mL, Rfl: 1   acetaminophen (TYLENOL) 500 MG tablet, Take 1,000 mg by mouth  every 6 (six) hours as needed for moderate pain. (Patient not taking: Reported on 07/04/2021), Disp: , Rfl:    LORazepam (ATIVAN) 0.5 MG tablet, Take 1 tablet (0.5 mg total) by mouth every 6 (six) hours as needed (Nausea or vomiting). (Patient not taking: Reported on 04/07/2021), Disp: 30 tablet, Rfl: 0   ondansetron (ZOFRAN) 8 MG tablet, Take 1 tablet (8 mg total) by mouth 2 (two) times daily as needed. Start on the third day after chemotherapy. (Patient not taking: Reported on 04/07/2021), Disp: 30 tablet, Rfl: 1   prochlorperazine (COMPAZINE) 10 MG tablet, Take 1 tablet (10 mg total) by mouth every 6 (six) hours as needed (Nausea or vomiting). (Patient not taking: Reported on 04/07/2021), Disp: 30 tablet, Rfl: 1   triamcinolone ointment (KENALOG) 0.5 %, Apply 1 application topically 2 (two) times daily. OVER THE PORT SITE FOR REDNESS (Patient not taking: Reported on 05/23/2021), Disp: 30 g, Rfl: 0  Physical exam:  Vitals:   07/04/21 0942  BP: (!) 119/51  Pulse: 99  Resp: 16  Temp: 98.7 F (37.1 C)  TempSrc: Tympanic  SpO2: 100%   Weight: 183 lb (83 kg)   Physical Exam Constitutional:      General: She is not in acute distress. Cardiovascular:     Rate and Rhythm: Normal rate and regular rhythm.     Heart sounds: Normal heart sounds.  Pulmonary:     Effort: Pulmonary effort is normal.  Skin:    General: Skin is warm and dry.  Neurological:     Mental Status: She is alert and oriented to person, place, and time.     CMP Latest Ref Rng & Units 07/04/2021  Glucose 70 - 99 mg/dL 136(H)  BUN 6 - 20 mg/dL 13  Creatinine 0.44 - 1.00 mg/dL 0.67  Sodium 135 - 145 mmol/L 138  Potassium 3.5 - 5.1 mmol/L 3.4(L)  Chloride 98 - 111 mmol/L 106  CO2 22 - 32 mmol/L 26  Calcium 8.9 - 10.3 mg/dL 9.0  Total Protein 6.5 - 8.1 g/dL 6.4(L)  Total Bilirubin 0.3 - 1.2 mg/dL 0.2(L)  Alkaline Phos 38 - 126 U/L 51  AST 15 - 41 U/L 25  ALT 0 - 44 U/L 18   CBC Latest Ref Rng & Units 07/04/2021  WBC 4.0 - 10.5 K/uL 3.1(L)  Hemoglobin 12.0 - 15.0 g/dL 7.8(L)  Hematocrit 36.0 - 46.0 % 25.0(L)  Platelets 150 - 400 K/uL 253      MR BREAST BILATERAL W WO CONTRAST INC CAD  Result Date: 06/30/2021 CLINICAL DATA:  56 year old female for evaluation of RIGHT breast cancer neoadjuvant treatment response. History of benign LEFT breast biopsy. EXAM: BILATERAL BREAST MRI WITH AND WITHOUT CONTRAST TECHNIQUE: Multiplanar, multisequence MR images of both breasts were obtained prior to and following the intravenous administration of 7.5 ml of Gadavist Three-dimensional MR images were rendered by post-processing of the original MR data on an independent workstation. The three-dimensional MR images were interpreted, and findings are reported in the following complete MRI report for this study. Three dimensional images were evaluated at the independent interpreting workstation using the DynaCAD thin client. COMPARISON:  03/27/2021 breast MR and prior studies FINDINGS: Breast composition: c. Heterogeneous fibroglandular tissue. Background parenchymal  enhancement: Minimal Right breast: Non masslike enhancement within the UPPER OUTER, LOWER OUTER and UPPER INNER RIGHT breast again identified. Although the area involved is unchanged measuring 10.9 x 8.4 x 6.8 cm, there is significantly decreased enhancement and confluency/solid tumor volume. Biopsy clip artifact within  the LOWER OUTER RIGHT breast again noted. No new or increasing suspicious enhancement/mass is noted. Left breast: Biopsy-proven non masslike enhancement within the LOWER OUTER LEFT breast is again noted. Lymph nodes: No abnormal appearing lymph nodes. Ancillary findings:  None. IMPRESSION: 1. Treatment response of known RIGHT breast malignancy. Although the measured area unchanged, there is significantly decreased enhancement and confluency/solid tumor volume. 2. Previously identified abnormal RIGHT axillary lymph nodes, now all have a normal appearance. 3. No new or progressive disease identified. RECOMMENDATION: Treatment plan BI-RADS CATEGORY  6: Known biopsy-proven malignancy. Electronically Signed   By: Margarette Canada M.D.   On: 06/30/2021 14:11  US Breast Limited Uni Right Inc Axilla  Result Date: 06/12/2021 CLINICAL DATA:  56 year old female presenting for tumor check status post neoadjuvant chemotherapy. Patient had biopsy-proven invasive mammary carcinoma diagnosed in October 22 with multiple abnormal right axillary lymph nodes, 1 of which was biopsy demonstrating metastatic disease. EXAM: ULTRASOUND OF THE RIGHT AXILLA COMPARISON:  Previous exam(s). FINDINGS: Targeted ultrasound is performed throughout the right axilla demonstrate all normal lymph nodes with normal morphology and cortical thickness measuring up to 0.4 cm. There is no suspicious lymph node or mass. IMPRESSION: Normal right axillary lymph nodes. RECOMMENDATION: Continue treatment plan for known right breast cancer. For evaluation of treatment response in the breast mammogram or MRI is recommended. I have discussed the  findings and recommendations with the patient. If applicable, a reminder letter will be sent to the patient regarding the next appointment. BI-RADS CATEGORY  2: Benign. Electronically Signed   By: Audie Pinto M.D.   On: 06/12/2021 11:21    Assessment and plan- Patient is a 56 y.o. female  with newly diagnosed clinical prognostic stage IIIa invasive mammary carcinoma of the right breast cT3 N1 M0 ER/PR positive HER2 negative.  She is s/p 4 cycles of neoadjuvant dose dense AC chemotherapy.  She is here for on treatment assessment prior to cycle 5 of weekly Taxol chemotherapy  Patient still has chemo-induced anemia which is stable to mildly improving.  Hemoglobin up to 7.8 today.  Continue to hold off on blood transfusion.  She will proceed with cycle 5 of weekly Taxol chemotherapy at full dose of 80 mg per metered square.  She will directly proceed with cycle 6 next week and I will see her back in 2 weeks for cycle 7.  Plan is to complete 12 cycles of chemotherapy.  Interim MRI midway through chemotherapy shows good response to treatment with normalization of the axillary lymph nodes and decreased enhancement in the site of the primary breast mass.  I have also reached out to Dr. Peyton Najjar so that he can arrange a follow-up with the patient and discuss that definitive breast surgery options including lumpectomy versus mastectomy and axillary lymph node management.     Visit Diagnosis 1. Encounter for antineoplastic chemotherapy   2. Antineoplastic chemotherapy induced anemia   3. Breast cancer, stage 1, estrogen receptor positive, right ([place])      Dr. Randa Evens, MD, MPH [facility] at [facility] [phone] 07/04/2021 1:06 PM

## 2021-07-04 NOTE — Patient Instructions (Signed)
[facility] AT [place]  Discharge Instructions: Thank you for choosing [facility] to provide your oncology and hematology care.  If you have a lab appointment with the [facility], please go directly to the [facility] and check in at the registration area.  Wear comfortable clothing and clothing appropriate for easy access to any Portacath or PICC line.   We strive to give you quality time with your provider. You may need to reschedule your appointment if you arrive late (15 or more minutes).  Arriving late affects you and other patients whose appointments are after yours.  Also, if you miss three or more appointments without notifying the office, you may be dismissed from the clinic at the providers discretion.      For prescription refill requests, have your pharmacy contact our office and allow 72 hours for refills to be completed.    Today you received the following chemotherapy and/or immunotherapy agents:Taxol      To help prevent nausea and vomiting after your treatment, we encourage you to take your nausea medication as directed.  BELOW ARE SYMPTOMS THAT SHOULD BE REPORTED IMMEDIATELY: *FEVER GREATER THAN 100.4 F (38 C) OR HIGHER *CHILLS OR SWEATING *NAUSEA AND VOMITING THAT IS NOT CONTROLLED WITH YOUR NAUSEA MEDICATION *UNUSUAL SHORTNESS OF BREATH *UNUSUAL BRUISING OR BLEEDING *URINARY PROBLEMS (pain or burning when urinating, or frequent urination) *BOWEL PROBLEMS (unusual diarrhea, constipation, pain near the anus) TENDERNESS IN MOUTH AND THROAT WITH OR WITHOUT PRESENCE OF ULCERS (sore throat, sores in mouth, or a toothache) UNUSUAL RASH, SWELLING OR PAIN  UNUSUAL VAGINAL DISCHARGE OR ITCHING   Items with * indicate a potential emergency and should be followed up as soon as possible or go to the Emergency Department if any problems should occur.  Please show the CHEMOTHERAPY ALERT CARD or IMMUNOTHERAPY ALERT CARD at check-in to the  Emergency Department and triage nurse.  Should you have questions after your visit or need to cancel or reschedule your appointment, please contact [facility] [place] AT [place]  [phone] and follow the prompts.  Office hours are 8:00 a.m. to 4:30 p.m. Monday - Friday. Please note that voicemails left after 4:00 p.m. may not be returned until the following business day.  We are closed weekends and major holidays. You have access to a nurse at all times for urgent questions. Please call the main number to the clinic [phone] and follow the prompts.  For any non-urgent questions, you may also contact your provider using MyChart. We now offer e-Visits for anyone 58 and older to request care online for non-urgent symptoms. For details visit [email or web address].   Also download the MyChart app! Go to the app store, search "MyChart", open the app, select [facility], and log in with your MyChart username and password.  Due to Covid, a mask is required upon entering the hospital/clinic. If you do not have a mask, one will be given to you upon arrival. For doctor visits, patients may have 1 support person aged 55 or older with them. For treatment visits, patients cannot have anyone with them due to current Covid guidelines and our immunocompromised population. Paclitaxel injection What is this medication? PACLITAXEL (PAK li TAX el) is a chemotherapy drug. It targets fast dividing cells, like cancer cells, and causes these cells to die. This medicine is used to treat ovarian cancer, breast cancer, lung cancer, Kaposi's sarcoma, and other cancers. This medicine may be used for other purposes; ask your health care  provider or pharmacist if you have questions. COMMON BRAND NAME(S): Onxol, Taxol What should I tell my care team before I take this medication? They need to know if you have any of these conditions: history of irregular heartbeat liver disease low blood counts, like low  white cell, platelet, or red cell counts lung or breathing disease, like asthma tingling of the fingers or toes, or other nerve disorder an unusual or allergic reaction to paclitaxel, alcohol, polyoxyethylated castor oil, other chemotherapy, other medicines, foods, dyes, or preservatives pregnant or trying to get pregnant breast-feeding How should I use this medication? This drug is given as an infusion into a vein. It is administered in a hospital or clinic by a specially trained health care professional. Talk to your pediatrician regarding the use of this medicine in children. Special care may be needed. Overdosage: If you think you have taken too much of this medicine contact a poison control center or emergency room at once. NOTE: This medicine is only for you. Do not share this medicine with others. What if I miss a dose? It is important not to miss your dose. Call your doctor or health care professional if you are unable to keep an appointment. What may interact with this medication? Do not take this medicine with any of the following medications: live virus vaccines This medicine may also interact with the following medications: antiviral medicines for hepatitis, HIV or AIDS certain antibiotics like erythromycin and clarithromycin certain medicines for fungal infections like ketoconazole and itraconazole certain medicines for seizures like carbamazepine, phenobarbital, phenytoin gemfibrozil nefazodone rifampin St. John's wort This list may not describe all possible interactions. Give your health care provider a list of all the medicines, herbs, non-prescription drugs, or dietary supplements you use. Also tell them if you smoke, drink alcohol, or use illegal drugs. Some items may interact with your medicine. What should I watch for while using this medication? Your condition will be monitored carefully while you are receiving this medicine. You will need important blood work done  while you are taking this medicine. This medicine can cause serious allergic reactions. To reduce your risk you will need to take other medicine(s) before treatment with this medicine. If you experience allergic reactions like skin rash, itching or hives, swelling of the face, lips, or tongue, tell your doctor or health care professional right away. In some cases, you may be given additional medicines to help with side effects. Follow all directions for their use. This drug may make you feel generally unwell. This is not uncommon, as chemotherapy can affect healthy cells as well as cancer cells. Report any side effects. Continue your course of treatment even though you feel ill unless your doctor tells you to stop. Call your doctor or health care professional for advice if you get a fever, chills or sore throat, or other symptoms of a cold or flu. Do not treat yourself. This drug decreases your body's ability to fight infections. Try to avoid being around people who are sick. This medicine may increase your risk to bruise or bleed. Call your doctor or health care professional if you notice any unusual bleeding. Be careful brushing and flossing your teeth or using a toothpick because you may get an infection or bleed more easily. If you have any dental work done, tell your dentist you are receiving this medicine. Avoid taking products that contain aspirin, acetaminophen, ibuprofen, naproxen, or ketoprofen unless instructed by your doctor. These medicines may hide a fever. Do not  become pregnant while taking this medicine. Women should inform their doctor if they wish to become pregnant or think they might be pregnant. There is a potential for serious side effects to an unborn child. Talk to your health care professional or pharmacist for more information. Do not breast-feed an infant while taking this medicine. Men are advised not to father a child while receiving this medicine. This product may contain  alcohol. Ask your pharmacist or healthcare provider if this medicine contains alcohol. Be sure to tell all healthcare providers you are taking this medicine. Certain medicines, like metronidazole and disulfiram, can cause an unpleasant reaction when taken with alcohol. The reaction includes flushing, headache, nausea, vomiting, sweating, and increased thirst. The reaction can last from 30 minutes to several hours. What side effects may I notice from receiving this medication? Side effects that you should report to your doctor or health care professional as soon as possible: allergic reactions like skin rash, itching or hives, swelling of the face, lips, or tongue breathing problems changes in vision fast, irregular heartbeat high or low blood pressure mouth sores pain, tingling, numbness in the hands or feet signs of decreased platelets or bleeding - bruising, pinpoint red spots on the skin, black, tarry stools, blood in the urine signs of decreased red blood cells - unusually weak or tired, feeling faint or lightheaded, falls signs of infection - fever or chills, cough, sore throat, pain or difficulty passing urine signs and symptoms of liver injury like dark yellow or brown urine; general ill feeling or flu-like symptoms; light-colored stools; loss of appetite; nausea; right upper belly pain; unusually weak or tired; yellowing of the eyes or skin swelling of the ankles, feet, hands unusually slow heartbeat Side effects that usually do not require medical attention (report to your doctor or health care professional if they continue or are bothersome): diarrhea hair loss loss of appetite muscle or joint pain nausea, vomiting pain, redness, or irritation at site where injected tiredness This list may not describe all possible side effects. Call your doctor for medical advice about side effects. You may report side effects to FDA at 1-800-FDA-1088. Where should I keep my medication? This drug  is given in a hospital or clinic and will not be stored at home. NOTE: This sheet is a summary. It may not cover all possible information. If you have questions about this medicine, talk to your doctor, pharmacist, or health care provider.  2022 [employer] (2021-01-28 00:00:00)

## 2021-07-04 NOTE — Progress Notes (Signed)
Pt and husband in for follow up, here for treatmenet and MRI results.  Pt reports right great toe is red.

## 2021-07-04 NOTE — Progress Notes (Signed)
Hbg 7.8. Per Dr Janese Banks may proceed with treatment today

## 2021-07-08 ENCOUNTER — Encounter: Payer: Self-pay | Admitting: Oncology

## 2021-07-11 ENCOUNTER — Inpatient Hospital Stay: Payer: Managed Care, Other (non HMO)

## 2021-07-11 ENCOUNTER — Other Ambulatory Visit: Payer: Self-pay

## 2021-07-11 ENCOUNTER — Encounter: Payer: Self-pay | Admitting: Oncology

## 2021-07-11 VITALS — BP 110/68 | Temp 99.1°F | Resp 18 | Wt 184.0 lb

## 2021-07-11 DIAGNOSIS — Z170 Estrogen receptor positive status [ER+]: Secondary | ICD-10-CM

## 2021-07-11 DIAGNOSIS — Z5111 Encounter for antineoplastic chemotherapy: Secondary | ICD-10-CM | POA: Diagnosis not present

## 2021-07-11 DIAGNOSIS — C50911 Malignant neoplasm of unspecified site of right female breast: Secondary | ICD-10-CM

## 2021-07-11 LAB — CBC WITH DIFFERENTIAL/PLATELET
Abs Immature Granulocytes: 0.06 10*3/uL (ref 0.00–0.07)
Basophils Absolute: 0 10*3/uL (ref 0.0–0.1)
Basophils Relative: 1 %
Eosinophils Absolute: 0.2 10*3/uL (ref 0.0–0.5)
Eosinophils Relative: 6 %
HCT: 24.7 % — ABNORMAL LOW (ref 36.0–46.0)
Hemoglobin: 7.8 g/dL — ABNORMAL LOW (ref 12.0–15.0)
Immature Granulocytes: 2 %
Lymphocytes Relative: 18 %
Lymphs Abs: 0.6 10*3/uL — ABNORMAL LOW (ref 0.7–4.0)
MCH: 30.7 pg (ref 26.0–34.0)
MCHC: 31.6 g/dL (ref 30.0–36.0)
MCV: 97.2 fL (ref 80.0–100.0)
Monocytes Absolute: 0.3 10*3/uL (ref 0.1–1.0)
Monocytes Relative: 10 %
Neutro Abs: 1.9 10*3/uL (ref 1.7–7.7)
Neutrophils Relative %: 63 %
Platelets: 243 10*3/uL (ref 150–400)
RBC: 2.54 MIL/uL — ABNORMAL LOW (ref 3.87–5.11)
RDW: 19 % — ABNORMAL HIGH (ref 11.5–15.5)
WBC: 3 10*3/uL — ABNORMAL LOW (ref 4.0–10.5)
nRBC: 0.7 % — ABNORMAL HIGH (ref 0.0–0.2)

## 2021-07-11 LAB — COMPREHENSIVE METABOLIC PANEL
ALT: 20 U/L (ref 0–44)
AST: 29 U/L (ref 15–41)
Albumin: 3.4 g/dL — ABNORMAL LOW (ref 3.5–5.0)
Alkaline Phosphatase: 54 U/L (ref 38–126)
Anion gap: 9 (ref 5–15)
BUN: 18 mg/dL (ref 6–20)
CO2: 23 mmol/L (ref 22–32)
Calcium: 8.8 mg/dL — ABNORMAL LOW (ref 8.9–10.3)
Chloride: 105 mmol/L (ref 98–111)
Creatinine, Ser: 0.84 mg/dL (ref 0.44–1.00)
GFR, Estimated: >60 mL/min (ref >60)
Glucose, Bld: 135 mg/dL — ABNORMAL HIGH (ref 70–99)
Potassium: 3.4 mmol/L — ABNORMAL LOW (ref 3.5–5.1)
Sodium: 137 mmol/L (ref 135–145)
Total Bilirubin: <0.1 mg/dL — ABNORMAL LOW (ref 0.3–1.2)
Total Protein: 6.2 g/dL — ABNORMAL LOW (ref 6.5–8.1)

## 2021-07-11 MED ORDER — HEPARIN SOD (PORK) LOCK FLUSH 100 UNIT/ML IV SOLN
500.0000 [IU] | Freq: Once | INTRAVENOUS | Status: AC | PRN
  Filled 2021-07-11: qty 5

## 2021-07-11 MED ORDER — DIPHENHYDRAMINE HCL 50 MG/ML IJ SOLN
50.0000 mg | Freq: Once | INTRAMUSCULAR | Status: AC
  Administered 2021-07-11: 50 mg via INTRAVENOUS
  Filled 2021-07-11: qty 1

## 2021-07-11 MED ORDER — SODIUM CHLORIDE 0.9 % IV SOLN
80.0000 mg/m2 | Freq: Once | INTRAVENOUS | Status: AC
  Administered 2021-07-11: 156 mg via INTRAVENOUS
  Filled 2021-07-11: qty 26

## 2021-07-11 MED ORDER — FAMOTIDINE IN NACL 20-0.9 MG/50ML-% IV SOLN
20.0000 mg | Freq: Once | INTRAVENOUS | Status: AC
  Administered 2021-07-11: 20 mg via INTRAVENOUS
  Filled 2021-07-11: qty 50

## 2021-07-11 MED ORDER — SODIUM CHLORIDE 0.9 % IV SOLN
10.0000 mg | Freq: Once | INTRAVENOUS | Status: AC
  Administered 2021-07-11: 10 mg via INTRAVENOUS
  Filled 2021-07-11: qty 10

## 2021-07-11 MED ORDER — SODIUM CHLORIDE 0.9 % IV SOLN
Freq: Once | INTRAVENOUS | Status: AC
  Filled 2021-07-11: qty 250

## 2021-07-11 MED ORDER — HEPARIN SOD (PORK) LOCK FLUSH 100 UNIT/ML IV SOLN
INTRAVENOUS | Status: AC
  Administered 2021-07-11: 500 [IU]
  Filled 2021-07-11: qty 5

## 2021-07-11 NOTE — Progress Notes (Signed)
Pt received taxol infusion in clinic today. Tolerated well. 

## 2021-07-11 NOTE — Progress Notes (Signed)
Per Dr Janese Banks, ok tp proceed with tx today- hgb 7.8

## 2021-07-11 NOTE — Patient Instructions (Signed)
[facility]   °Discharge Instructions: °Thank you for choosing [facility] to provide your oncology and hematology care.  °If you have a lab appointment with the Cancer Center, please go directly to the Cancer Center and check in at the registration area. ° °Wear comfortable clothing and clothing appropriate for easy access to any Portacath or PICC line.  ° °We strive to give you quality time with your provider. You may need to reschedule your appointment if you arrive late (15 or more minutes).  Arriving late affects you and other patients whose appointments are after yours.  Also, if you miss three or more appointments without notifying the office, you may be dismissed from the clinic at the provider’s discretion.    °  °For prescription refill requests, have your pharmacy contact our office and allow 72 hours for refills to be completed.   ° °Today you received the following chemotherapy and/or immunotherapy agents - Taxol    °  °To help prevent nausea and vomiting after your treatment, we encourage you to take your nausea medication as directed. ° °BELOW ARE SYMPTOMS THAT SHOULD BE REPORTED IMMEDIATELY: °*FEVER GREATER THAN 100.4 F (38 °C) OR HIGHER °*CHILLS OR SWEATING °*NAUSEA AND VOMITING THAT IS NOT CONTROLLED WITH YOUR NAUSEA MEDICATION °*UNUSUAL SHORTNESS OF BREATH °*UNUSUAL BRUISING OR BLEEDING °*URINARY PROBLEMS (pain or burning when urinating, or frequent urination) °*BOWEL PROBLEMS (unusual diarrhea, constipation, pain near the anus) °TENDERNESS IN MOUTH AND THROAT WITH OR WITHOUT PRESENCE OF ULCERS (sore throat, sores in mouth, or a toothache) °UNUSUAL RASH, SWELLING OR PAIN  °UNUSUAL VAGINAL DISCHARGE OR ITCHING  ° °Items with * indicate a potential emergency and should be followed up as soon as possible or go to the Emergency Department if any problems should occur. ° °Please show the CHEMOTHERAPY ALERT CARD or IMMUNOTHERAPY ALERT CARD at check-in to  the Emergency Department and triage nurse. ° °Should you have questions after your visit or need to cancel or reschedule your appointment, please contact [facility]  [phone] and follow the prompts.  Office hours are 8:00 a.m. to 4:30 p.m. Monday - Friday. Please note that voicemails left after 4:00 p.m. may not be returned until the following business day.  We are closed weekends and major holidays. You have access to a nurse at all times for urgent questions. Please call the main number to the clinic [phone] and follow the prompts. ° °For any non-urgent questions, you may also contact your provider using MyChart. We now offer e-Visits for anyone 58 and older to request care online for non-urgent symptoms. For details visit [email or web address]. °  °Also download the MyChart app! Go to the app store, search "MyChart", open the app, select [facility], and log in with your MyChart username and password. ° °Due to Covid, a mask is required upon entering the hospital/clinic. If you do not have a mask, one will be given to you upon arrival. For doctor visits, patients may have 1 support person aged 18 or older with them. For treatment visits, patients cannot have anyone with them due to current Covid guidelines and our immunocompromised population.  ° °Diphenhydramine Injection °What is this medication? °DIPHENHYDRAMINE (dye fen HYE dra meen) treats the symptoms of allergic reactions. It may also be used to prevent and treat motion sickness or the symptoms of Parkinson disease. It works by blocking histamine, a substance released by the body during an allergic reaction. It belongs to   a group of medications called antihistamines. This medicine may be used for other purposes; ask your health care provider or pharmacist if you have questions. COMMON BRAND NAME(S): Benadryl What should I tell my care team before I take this medication? They need to know if you have any of  these conditions: Asthma or lung disease Glaucoma High blood pressure or heart disease Liver disease Pain or difficulty passing urine Prostate trouble Ulcers or other stomach problems An unusual or allergic reaction to diphenhydramine, antihistamines, other medications foods, dyes, or preservatives Pregnant or trying to get pregnant Breast-feeding How should I use this medication? This medication is for injection into a vein or a muscle. It is usually given in a hospital or clinic. If you get this medication at home, you will be taught how to prepare and give this medication. Use exactly as directed. Take your medication at regular intervals. Do not take your medication more often than directed. It is important that you put your used needles and syringes in a special sharps container. Do not put them in a trash can. If you do not have a sharps container, call your pharmacist or care team to get one. Talk to your care team regarding the use of this medication in children. While this medication may be prescribed for selected conditions, precautions do apply. This medication is not approved for use in newborns and premature babies. Patients over 58 years old may have a stronger reaction and need a smaller dose. Overdosage: If you think you have taken too much of this medicine contact a poison control center or emergency room at once. NOTE: This medicine is only for you. Do not share this medicine with others. What if I miss a dose? If you miss a dose, take it as soon as you can. If it is almost time for your next dose, take only that dose. Do not take double or extra doses. What may interact with this medication? Do not take this medication with any of the following: MAOIs like Carbex, Eldepryl, Marplan, Nardil, and Parnate This medication may also interact with the following: Alcohol Barbiturates, like phenobarbital Medications for bladder spasm like oxybutynin, tolterodine Medications for  blood pressure Medications for depression, anxiety, or psychotic disturbances Medications for movement abnormalities or Parkinson disease Medications for sleep Other medications for cold, cough or allergy Some medications for the stomach like chlordiazepoxide, dicyclomine This list may not describe all possible interactions. Give your health care provider a list of all the medicines, herbs, non-prescription drugs, or dietary supplements you use. Also tell them if you smoke, drink alcohol, or use illegal drugs. Some items may interact with your medicine. What should I watch for while using this medication? Your condition will be monitored carefully while you are receiving this medication. Tell your care team if your symptoms do not start to get better or if they get worse. You may get drowsy or dizzy. Do not drive, use machinery, or do anything that needs mental alertness until you know how this medication affects you. Do not stand or sit up quickly, especially if you are an older patient. This reduces the risk of dizzy or fainting spells. Alcohol may interfere with the effect of this medication. Avoid alcoholic drinks. Your mouth may get dry. Chewing sugarless gum or sucking hard candy, and drinking plenty of water may help. Contact your care team if the problem does not go away or is severe. What side effects may I notice from receiving this medication? Side effects  that you should report to your care team as soon as possible: Allergic reactions--skin rash, itching, hives, swelling of the face, lips, tongue, or throat Sudden eye pain or change in vision such as blurry vision, seeing halos around lights, vision loss Trouble passing urine Side effects that usually do not require medical attention (report to your care team if they continue or are bothersome): Constipation Drowsiness Dry mouth Headache Upset stomach This list may not describe all possible side effects. Call your doctor for medical  advice about side effects. You may report side effects to FDA at 1-800-FDA-1088. Where should I keep my medication? Keep out of the reach of children and pets. If you are using this medication at home, you will be instructed on how to store this medication. Throw away any unused medication after the expiration date on the label. NOTE: This sheet is a summary. It may not cover all possible information. If you have questions about this medicine, talk to your doctor, pharmacist, or health care provider.  2022 Elsevier/Gold Standard (2020-06-07 00:00:00)  Famotidine Solution for Injection What is this medication? FAMOTIDINE (fa MOE ti deen) treats stomach ulcers, reflux disease, or other conditions that cause too much stomach acid. It works by reducing the amount of acid in the stomach. This medicine may be used for other purposes; ask your health care provider or pharmacist if you have questions. COMMON BRAND NAME(S): Pepcid What should I tell my care team before I take this medication? They need to know if you have any of these conditions: Kidney or liver disease An unusual or allergic reaction to famotidine, other medications, foods, dyes, or preservatives Pregnant or trying to get pregnant Breast-feeding How should I use this medication? This medication is for infusion into a vein. It is given in a hospital or clinic setting. Talk to your care team regarding the use of this medication in children. Special care may be needed. Overdosage: If you think you have taken too much of this medicine contact a poison control center or emergency room at once. NOTE: This medicine is only for you. Do not share this medicine with others. What if I miss a dose? This does not apply. What may interact with this medication? Delavirdine Itraconazole Ketoconazole This list may not describe all possible interactions. Give your health care provider a list of all the medicines, herbs, non-prescription drugs,  or dietary supplements you use. Also tell them if you smoke, drink alcohol, or use illegal drugs. Some items may interact with your medicine. What should I watch for while using this medication? Tell your doctor or health care professional if your condition does not start to get better or gets worse. Do not take with aspirin, ibuprofen, or other antiinflammatory medications. These can aggravate your condition. Do not smoke cigarettes or drink alcohol. These increase irritation in your stomach and can increase the time it will take for ulcers to heal. Cigarettes and alcohol can also worsen acid reflux or heartburn. If you get black, tarry stools or vomit up what looks like coffee grounds, call your doctor or health care professional at once. You may have a bleeding ulcer. This medication may cause a decrease in vitamin B12. Make sure that you get enough vitamin B12 while you are taking this medication. Discuss the foods you eat and the vitamins you take with your care team. What side effects may I notice from receiving this medication? Side effects that you should report to your care team as soon as possible:  Allergic reactions--skin rash, itching, hives, swelling of the face, lips, tongue, or throat Confusion Hallucinations Side effects that usually do not require medical attention (report to your care team if they continue or are bothersome): Constipation Diarrhea Dizziness Headache This list may not describe all possible side effects. Call your doctor for medical advice about side effects. You may report side effects to FDA at 1-800-FDA-1088. Where should I keep my medication? This medication is given in a hospital or clinic. You will not be given this medication to store at home. NOTE: This sheet is a summary. It may not cover all possible information. If you have questions about this medicine, talk to your doctor, pharmacist, or health care provider.  2022 Elsevier/Gold Standard (2020-05-14  00:00:00)  Dexamethasone injection What is this medication? DEXAMETHASONE (dex a METH a sone) is a corticosteroid. It is used to treat inflammation of the skin, joints, lungs, and other organs. Common conditions treated include asthma, allergies, and arthritis. It is also used for other conditions, like blood disorders and diseases of the adrenal glands. This medicine may be used for other purposes; ask your health care provider or pharmacist if you have questions. COMMON BRAND NAME(S): Decadron, DoubleDex, ReadySharp Dexamethasone, Simplist Dexamethasone, Solurex What should I tell my care team before I take this medication? They need to know if you have any of these conditions: Cushing's syndrome diabetes glaucoma heart disease high blood pressure infection like herpes, measles, tuberculosis, or chickenpox kidney disease liver disease mental illness myasthenia gravis osteoporosis previous heart attack seizures stomach or intestine problems thyroid disease an unusual or allergic reaction to dexamethasone, corticosteroids, other medicines, lactose, foods, dyes, or preservatives pregnant or trying to get pregnant breast-feeding How should I use this medication? This medicine is for injection into a muscle, joint, lesion, soft tissue, or vein. It is given by a health care professional in a hospital or clinic setting. Talk to your pediatrician regarding the use of this medicine in children. Special care may be needed. Overdosage: If you think you have taken too much of this medicine contact a poison control center or emergency room at once. NOTE: This medicine is only for you. Do not share this medicine with others. What if I miss a dose? This may not apply. If you are having a series of injections over a prolonged period, try not to miss an appointment. Call your doctor or health care professional to reschedule if you are unable to keep an appointment. What may interact with this  medication? Do not take this medicine with any of the following medications: live virus vaccines This medicine may also interact with the following medications: aminoglutethimide amphotericin B aspirin and aspirin-like medicines certain antibiotics like erythromycin, clarithromycin, and troleandomycin certain antivirals for HIV or hepatitis certain medicines for seizures like carbamazepine, phenobarbital, phenytoin certain medicines to treat myasthenia gravis cholestyramine cyclosporine digoxin diuretics ephedrine female hormones, like estrogen or progestins and birth control pills insulin or other medicines for diabetes isoniazid ketoconazole medicines that relax muscles for surgery mifepristone NSAIDs, medicines for pain and inflammation, like ibuprofen or naproxen rifampin skin tests for allergies thalidomide vaccines warfarin This list may not describe all possible interactions. Give your health care provider a list of all the medicines, herbs, non-prescription drugs, or dietary supplements you use. Also tell them if you smoke, drink alcohol, or use illegal drugs. Some items may interact with your medicine. What should I watch for while using this medication? Visit your health care professional for regular checks on your  progress. Tell your health care professional if your symptoms do not start to get better or if they get worse. Your condition will be monitored carefully while you are receiving this medicine. Wear a medical ID bracelet or chain. Carry a card that describes your disease and details of your medicine and dosage times. This medicine may increase your risk of getting an infection. Call your health care professional for advice if you get a fever, chills, or sore throat, or other symptoms of a cold or flu. Do not treat yourself. Try to avoid being around people who are sick. Call your health care professional if you are around anyone with measles, chickenpox, or if you  develop sores or blisters that do not heal properly. If you are going to need surgery or other procedures, tell your doctor or health care professional that you have taken this medicine within the last 12 months. Ask your doctor or health care professional about your diet. You may need to lower the amount of salt you eat. This medicine may increase blood sugar. Ask your healthcare provider if changes in diet or medicines are needed if you have diabetes. What side effects may I notice from receiving this medication? Side effects that you should report to your doctor or health care professional as soon as possible: allergic reactions like skin rash, itching or hives, swelling of the face, lips, or tongue bloody or black, tarry stools changes in emotions or moods changes in vision confusion, excitement, restlessness depressed mood eye pain hallucinations muscle weakness severe or sudden stomach or belly pain signs and symptoms of high blood sugar such as being more thirsty or hungry or having to urinate more than normal. You may also feel very tired or have blurry vision. signs and symptoms of infection like fever; chills; cough; sore throat; pain or trouble passing urine swelling of ankles, feet unusual bruising or bleeding wounds that do not heal Side effects that usually do not require medical attention (report to your doctor or health care professional if they continue or are bothersome): increased appetite increased growth of face or body hair headache nausea, vomiting pain, redness, or irritation at site where injected skin problems, acne, thin and shiny skin trouble sleeping weight gain This list may not describe all possible side effects. Call your doctor for medical advice about side effects. You may report side effects to FDA at 1-800-FDA-1088. Where should I keep my medication? This medicine is given in a hospital or clinic and will not be stored at home. NOTE: This sheet is  a summary. It may not cover all possible information. If you have questions about this medicine, talk to your doctor, pharmacist, or health care provider.  2022 Elsevier/Gold Standard (2018-11-24 00:00:00)  Paclitaxel injection What is this medication? PACLITAXEL (PAK li TAX el) is a chemotherapy drug. It targets fast dividing cells, like cancer cells, and causes these cells to die. This medicine is used to treat ovarian cancer, breast cancer, lung cancer, Kaposi's sarcoma, and other cancers. This medicine may be used for other purposes; ask your health care provider or pharmacist if you have questions. COMMON BRAND NAME(S): Onxol, Taxol What should I tell my care team before I take this medication? They need to know if you have any of these conditions: history of irregular heartbeat liver disease low blood counts, like low Roverto Bodmer cell, platelet, or red cell counts lung or breathing disease, like asthma tingling of the fingers or toes, or other nerve disorder an unusual or  allergic reaction to paclitaxel, alcohol, polyoxyethylated castor oil, other chemotherapy, other medicines, foods, dyes, or preservatives pregnant or trying to get pregnant breast-feeding How should I use this medication? This drug is given as an infusion into a vein. It is administered in a hospital or clinic by a specially trained health care professional. Talk to your pediatrician regarding the use of this medicine in children. Special care may be needed. Overdosage: If you think you have taken too much of this medicine contact a poison control center or emergency room at once. NOTE: This medicine is only for you. Do not share this medicine with others. What if I miss a dose? It is important not to miss your dose. Call your doctor or health care professional if you are unable to keep an appointment. What may interact with this medication? Do not take this medicine with any of the following medications: live virus  vaccines This medicine may also interact with the following medications: antiviral medicines for hepatitis, HIV or AIDS certain antibiotics like erythromycin and clarithromycin certain medicines for fungal infections like ketoconazole and itraconazole certain medicines for seizures like carbamazepine, phenobarbital, phenytoin gemfibrozil nefazodone rifampin St. John's wort This list may not describe all possible interactions. Give your health care provider a list of all the medicines, herbs, non-prescription drugs, or dietary supplements you use. Also tell them if you smoke, drink alcohol, or use illegal drugs. Some items may interact with your medicine. What should I watch for while using this medication? Your condition will be monitored carefully while you are receiving this medicine. You will need important blood work done while you are taking this medicine. This medicine can cause serious allergic reactions. To reduce your risk you will need to take other medicine(s) before treatment with this medicine. If you experience allergic reactions like skin rash, itching or hives, swelling of the face, lips, or tongue, tell your doctor or health care professional right away. In some cases, you may be given additional medicines to help with side effects. Follow all directions for their use. This drug may make you feel generally unwell. This is not uncommon, as chemotherapy can affect healthy cells as well as cancer cells. Report any side effects. Continue your course of treatment even though you feel ill unless your doctor tells you to stop. Call your doctor or health care professional for advice if you get a fever, chills or sore throat, or other symptoms of a cold or flu. Do not treat yourself. This drug decreases your body's ability to fight infections. Try to avoid being around people who are sick. This medicine may increase your risk to bruise or bleed. Call your doctor or health care professional  if you notice any unusual bleeding. Be careful brushing and flossing your teeth or using a toothpick because you may get an infection or bleed more easily. If you have any dental work done, tell your dentist you are receiving this medicine. Avoid taking products that contain aspirin, acetaminophen, ibuprofen, naproxen, or ketoprofen unless instructed by your doctor. These medicines may hide a fever. Do not become pregnant while taking this medicine. Women should inform their doctor if they wish to become pregnant or think they might be pregnant. There is a potential for serious side effects to an unborn child. Talk to your health care professional or pharmacist for more information. Do not breast-feed an infant while taking this medicine. Men are advised not to father a child while receiving this medicine. This product may contain alcohol.  Ask your pharmacist or healthcare provider if this medicine contains alcohol. Be sure to tell all healthcare providers you are taking this medicine. Certain medicines, like metronidazole and disulfiram, can cause an unpleasant reaction when taken with alcohol. The reaction includes flushing, headache, nausea, vomiting, sweating, and increased thirst. The reaction can last from 30 minutes to several hours. What side effects may I notice from receiving this medication? Side effects that you should report to your doctor or health care professional as soon as possible: allergic reactions like skin rash, itching or hives, swelling of the face, lips, or tongue breathing problems changes in vision fast, irregular heartbeat high or low blood pressure mouth sores pain, tingling, numbness in the hands or feet signs of decreased platelets or bleeding - bruising, pinpoint red spots on the skin, black, tarry stools, blood in the urine signs of decreased red blood cells - unusually weak or tired, feeling faint or lightheaded, falls signs of infection - fever or chills, cough,  sore throat, pain or difficulty passing urine signs and symptoms of liver injury like dark yellow or brown urine; general ill feeling or flu-like symptoms; light-colored stools; loss of appetite; nausea; right upper belly pain; unusually weak or tired; yellowing of the eyes or skin swelling of the ankles, feet, hands unusually slow heartbeat Side effects that usually do not require medical attention (report to your doctor or health care professional if they continue or are bothersome): diarrhea hair loss loss of appetite muscle or joint pain nausea, vomiting pain, redness, or irritation at site where injected tiredness This list may not describe all possible side effects. Call your doctor for medical advice about side effects. You may report side effects to FDA at 1-800-FDA-1088. Where should I keep my medication? This drug is given in a hospital or clinic and will not be stored at home. NOTE: This sheet is a summary. It may not cover all possible information. If you have questions about this medicine, talk to your doctor, pharmacist, or health care provider.  2022 Elsevier/Gold Standard (2021-01-28 00:00:00)

## 2021-07-16 ENCOUNTER — Encounter: Payer: Self-pay | Admitting: Plastic Surgery

## 2021-07-16 ENCOUNTER — Other Ambulatory Visit: Payer: Self-pay

## 2021-07-16 ENCOUNTER — Ambulatory Visit: Payer: Managed Care, Other (non HMO) | Admitting: Plastic Surgery

## 2021-07-16 VITALS — BP 125/81 | HR 64 | Ht 65.5 in | Wt 183.0 lb

## 2021-07-16 DIAGNOSIS — C50911 Malignant neoplasm of unspecified site of right female breast: Secondary | ICD-10-CM | POA: Diagnosis not present

## 2021-07-16 DIAGNOSIS — Z170 Estrogen receptor positive status [ER+]: Secondary | ICD-10-CM | POA: Diagnosis not present

## 2021-07-16 NOTE — Progress Notes (Signed)
Referring Provider Derinda Late, MD [phone] [facility] [place],  [place] [address]   CC:  Chief Complaint  Patient presents with   Consult      Emma Cuevas is an 56 y.o. female.  HPI: Patient presents to discuss breast reconstruction.  She was diagnosed sometime ago with a right-sided breast cancer.  She is currently undergoing neoadjuvant chemotherapy.  She is planning for a right sided mastectomy with Dr. [name] after she completes neoadjuvant chemotherapy.  She is interested in implant-based reconstruction.  She does not smoke and is not diabetic.  Allergies  Allergen Reactions   Tape     Redness and burning    Outpatient Encounter Medications as of 07/16/2021  Medication Sig   acetaminophen (TYLENOL) 500 MG tablet Take 1,000 mg by mouth every 6 (six) hours as needed for moderate pain.   Calcium Carb-Cholecalciferol 500-10 MG-MCG TABS Take 1 tablet by mouth every morning.   lidocaine-prilocaine (EMLA) cream Apply to affected area once   LORazepam (ATIVAN) 0.5 MG tablet Take 1 tablet (0.5 mg total) by mouth every 6 (six) hours as needed (Nausea or vomiting).   ondansetron (ZOFRAN) 8 MG tablet Take 1 tablet (8 mg total) by mouth 2 (two) times daily as needed. Start on the third day after chemotherapy.   prochlorperazine (COMPAZINE) 10 MG tablet Take 1 tablet (10 mg total) by mouth every 6 (six) hours as needed (Nausea or vomiting).   triamcinolone ointment (KENALOG) 0.5 % Apply 1 application topically 2 (two) times daily. OVER THE PORT SITE FOR REDNESS   ZIEXTENZO 6 MG/0.6ML injection INJECT THE CONTENTS OF ONE SYRINGE (6 MG) UNDER THE SKIN 24 TO 72 HOURS AFTER CHEMO EVERY 14 DAYS   No facility-administered encounter medications on file as of 07/16/2021.     Past Medical History:  Diagnosis Date   Anemia    Benign neoplasm of breast 2013   left breast   BRCA negative 2015   Breast cancer ([place])    Cancer ([place])     Family history of bladder cancer    Family history of breast cancer    Family history of malignant neoplasm of breast 2013   Family history of pancreatic cancer    Screening for obesity     Past Surgical History:  Procedure Laterality Date   ABDOMINAL HYSTERECTOMY  2011   partial   BREAST BIOPSY Left 2013   BREAST BIOPSY Right 03/14/2021   Stereo bx-anterior calcs, "coil" clip-path pending   BREAST BIOPSY Right 03/14/2021   stereo bx-calcs, "Ribbon" clip-path pending   breast biopsy Right 03/14/2021   [place] Bx, Axilla, path pending   BREAST BIOPSY Left 04/30/2021   BREAST SURGERY Left 10/16/2011   left breast finesse biopsy   COLONOSCOPY WITH PROPOFOL N/A 12/05/2014   Procedure: COLONOSCOPY WITH PROPOFOL;  Surgeon: Christene Lye, MD;  Location: [facility] END[facility];  Service: Endoscopy;  Laterality: N/A;   DILATION AND CURETTAGE OF UTERUS     IR CV LINE INJECTION  05/06/2021   IR IMAGING GUIDED PORT INSERTION  05/08/2021   PORTACATH PLACEMENT N/A 04/02/2021   Procedure: INSERTION PORT-A-CATH;  Surgeon: Herbert Pun, MD;  Location: [facility] ORS;  Service: General;  Laterality: N/A;    Family History  Problem Relation Age of Onset   Breast cancer Mother 19   Bladder Cancer Father 69   Breast cancer Paternal Aunt 37   Pancreatic cancer Paternal Uncle 58   Pancreatic cancer  Maternal Grandfather 73   Cancer Cousin        in ear    Social History   Social History Narrative   Not on file     Review of Systems General: Denies fevers, chills, weight loss CV: Denies chest pain, shortness of breath, palpitations  Physical Exam Vitals with BMI 07/16/2021 07/11/2021 07/04/2021  Height 5' 5.5" - -  Weight 183 lbs 184 lbs 183 lbs  BMI 29.98 24.58 09.98  Systolic 338 250 539  Diastolic 81 68 51  Pulse 64 - 99    General:  No acute distress,  Alert and oriented, Non-Toxic, Normal speech and affect Breast: She has at least grade 2 ptosis if not grade 3.  She is larger on  the right.  Base width is 12.5 cm.  No obvious scars.  Assessment/Plan Patient presents to discuss breast reconstruction.  I think she is a good candidate for immediate right sided implant-based reconstruction.  We briefly discussed autologous options and she is not interested in those at this point.  I explained risks associated with implant-based reconstruction that include bleeding, infection, damage to surrounding structures need for additional procedures.  We discussed the potential for wound healing complications that might affect the success of the reconstruction.  I explained I would place a tissue expander at the time of her mastectomy which would subsequently be inflated and switched out to a gel implant at a second surgery.  We discussed the expansion process and the general postoperative expectations.  I discussed the need for drains.  All of her questions were answered and she is interested in moving forward.  Cindra Presume 07/16/2021, 9:55 AM

## 2021-07-18 ENCOUNTER — Encounter: Payer: Self-pay | Admitting: Oncology

## 2021-07-18 ENCOUNTER — Other Ambulatory Visit: Payer: Self-pay

## 2021-07-18 ENCOUNTER — Inpatient Hospital Stay: Payer: Managed Care, Other (non HMO)

## 2021-07-18 ENCOUNTER — Inpatient Hospital Stay (HOSPITAL_BASED_OUTPATIENT_CLINIC_OR_DEPARTMENT_OTHER): Payer: Managed Care, Other (non HMO) | Admitting: Oncology

## 2021-07-18 VITALS — BP 118/55 | HR 88 | Temp 97.8°F | Resp 20 | Wt 183.1 lb

## 2021-07-18 DIAGNOSIS — Z5111 Encounter for antineoplastic chemotherapy: Secondary | ICD-10-CM | POA: Diagnosis not present

## 2021-07-18 DIAGNOSIS — C50911 Malignant neoplasm of unspecified site of right female breast: Secondary | ICD-10-CM

## 2021-07-18 DIAGNOSIS — Z170 Estrogen receptor positive status [ER+]: Secondary | ICD-10-CM | POA: Diagnosis not present

## 2021-07-18 DIAGNOSIS — T451X5A Adverse effect of antineoplastic and immunosuppressive drugs, initial encounter: Secondary | ICD-10-CM

## 2021-07-18 DIAGNOSIS — D6481 Anemia due to antineoplastic chemotherapy: Secondary | ICD-10-CM | POA: Diagnosis not present

## 2021-07-18 LAB — CBC WITH DIFFERENTIAL/PLATELET
Abs Immature Granulocytes: 0.09 10*3/uL — ABNORMAL HIGH (ref 0.00–0.07)
Basophils Absolute: 0.1 10*3/uL (ref 0.0–0.1)
Basophils Relative: 2 %
Eosinophils Absolute: 0.1 10*3/uL (ref 0.0–0.5)
Eosinophils Relative: 3 %
HCT: 26.2 % — ABNORMAL LOW (ref 36.0–46.0)
Hemoglobin: 8.3 g/dL — ABNORMAL LOW (ref 12.0–15.0)
Immature Granulocytes: 3 %
Lymphocytes Relative: 18 %
Lymphs Abs: 0.6 10*3/uL — ABNORMAL LOW (ref 0.7–4.0)
MCH: 31.1 pg (ref 26.0–34.0)
MCHC: 31.7 g/dL (ref 30.0–36.0)
MCV: 98.1 fL (ref 80.0–100.0)
Monocytes Absolute: 0.5 10*3/uL (ref 0.1–1.0)
Monocytes Relative: 14 %
Neutro Abs: 1.9 10*3/uL (ref 1.7–7.7)
Neutrophils Relative %: 60 %
Platelets: 253 10*3/uL (ref 150–400)
RBC: 2.67 MIL/uL — ABNORMAL LOW (ref 3.87–5.11)
RDW: 18 % — ABNORMAL HIGH (ref 11.5–15.5)
WBC: 3.2 10*3/uL — ABNORMAL LOW (ref 4.0–10.5)
nRBC: 0.9 % — ABNORMAL HIGH (ref 0.0–0.2)

## 2021-07-18 LAB — COMPREHENSIVE METABOLIC PANEL
ALT: 22 U/L (ref 0–44)
AST: 28 U/L (ref 15–41)
Albumin: 3.7 g/dL (ref 3.5–5.0)
Alkaline Phosphatase: 57 U/L (ref 38–126)
Anion gap: 8 (ref 5–15)
BUN: 16 mg/dL (ref 6–20)
CO2: 25 mmol/L (ref 22–32)
Calcium: 9.5 mg/dL (ref 8.9–10.3)
Chloride: 104 mmol/L (ref 98–111)
Creatinine, Ser: 0.71 mg/dL (ref 0.44–1.00)
GFR, Estimated: >60 mL/min (ref >60)
Glucose, Bld: 101 mg/dL — ABNORMAL HIGH (ref 70–99)
Potassium: 4 mmol/L (ref 3.5–5.1)
Sodium: 137 mmol/L (ref 135–145)
Total Bilirubin: 0.3 mg/dL (ref 0.3–1.2)
Total Protein: 6.8 g/dL (ref 6.5–8.1)

## 2021-07-18 MED ORDER — SODIUM CHLORIDE 0.9 % IV SOLN
10.0000 mg | Freq: Once | INTRAVENOUS | Status: AC
  Administered 2021-07-18: 10 mg via INTRAVENOUS
  Filled 2021-07-18: qty 10

## 2021-07-18 MED ORDER — HEPARIN SOD (PORK) LOCK FLUSH 100 UNIT/ML IV SOLN
500.0000 [IU] | Freq: Once | INTRAVENOUS | Status: AC | PRN
  Administered 2021-07-18: 500 [IU]
  Filled 2021-07-18: qty 5

## 2021-07-18 MED ORDER — DIPHENHYDRAMINE HCL 50 MG/ML IJ SOLN
50.0000 mg | Freq: Once | INTRAMUSCULAR | Status: AC
  Administered 2021-07-18: 50 mg via INTRAVENOUS
  Filled 2021-07-18: qty 1

## 2021-07-18 MED ORDER — SODIUM CHLORIDE 0.9 % IV SOLN
Freq: Once | INTRAVENOUS | Status: AC
  Filled 2021-07-18: qty 250

## 2021-07-18 MED ORDER — SODIUM CHLORIDE 0.9 % IV SOLN
80.0000 mg/m2 | Freq: Once | INTRAVENOUS | Status: AC
  Administered 2021-07-18: 156 mg via INTRAVENOUS
  Filled 2021-07-18: qty 26

## 2021-07-18 MED ORDER — FAMOTIDINE IN NACL 20-0.9 MG/50ML-% IV SOLN
20.0000 mg | Freq: Once | INTRAVENOUS | Status: AC
  Administered 2021-07-18: 20 mg via INTRAVENOUS
  Filled 2021-07-18: qty 50

## 2021-07-18 NOTE — Patient Instructions (Signed)
[facility] [place]  Discharge Instructions: Thank you for choosing [facility] to provide your oncology and hematology care.  If you have a lab appointment with the [facility], please go directly to the [facility] and check in at the registration area.  Wear comfortable clothing and clothing appropriate for easy access to any Portacath or PICC line.   We strive to give you quality time with your provider. You may need to reschedule your appointment if you arrive late (15 or more minutes).  Arriving late affects you and other patients whose appointments are after yours.  Also, if you miss three or more appointments without notifying the office, you may be dismissed from the clinic at the providers discretion.      For prescription refill requests, have your pharmacy contact our office and allow 72 hours for refills to be completed.    Today you received the following chemotherapy and/or immunotherapy agents TAXOL      To help prevent nausea and vomiting after your treatment, we encourage you to take your nausea medication as directed.  BELOW ARE SYMPTOMS THAT SHOULD BE REPORTED IMMEDIATELY: *FEVER GREATER THAN 100.4 F (38 C) OR HIGHER *CHILLS OR SWEATING *NAUSEA AND VOMITING THAT IS NOT CONTROLLED WITH YOUR NAUSEA MEDICATION *UNUSUAL SHORTNESS OF BREATH *UNUSUAL BRUISING OR BLEEDING *URINARY PROBLEMS (pain or burning when urinating, or frequent urination) *BOWEL PROBLEMS (unusual diarrhea, constipation, pain near the anus) TENDERNESS IN MOUTH AND THROAT WITH OR WITHOUT PRESENCE OF ULCERS (sore throat, sores in mouth, or a toothache) UNUSUAL RASH, SWELLING OR PAIN  UNUSUAL VAGINAL DISCHARGE OR ITCHING   Items with * indicate a potential emergency and should be followed up as soon as possible or go to the Emergency Department if any problems should occur.  Please show the CHEMOTHERAPY ALERT CARD or IMMUNOTHERAPY ALERT CARD at check-in to the  Emergency Department and triage nurse.  Should you have questions after your visit or need to cancel or reschedule your appointment, please contact [facility] [place] [facility] [place]  [phone] and follow the prompts.  Office hours are 8:00 a.m. to 4:30 p.m. Monday - Friday. Please note that voicemails left after 4:00 p.m. may not be returned until the following business day.  We are closed weekends and major holidays. You have access to a nurse at all times for urgent questions. Please call the main number to the clinic [phone] and follow the prompts.  For any non-urgent questions, you may also contact your provider using MyChart. We now offer e-Visits for anyone 58 and older to request care online for non-urgent symptoms. For details visit [email or web address].   Also download the MyChart app! Go to the app store, search "MyChart", open the app, select [facility], and log in with your MyChart username and password.  Due to Covid, a mask is required upon entering the hospital/clinic. If you do not have a mask, one will be given to you upon arrival. For doctor visits, patients may have 1 support person aged 58 or older with them. For treatment visits, patients cannot have anyone with them due to current Covid guidelines and our immunocompromised population.   Paclitaxel injection What is this medication? PACLITAXEL (PAK li TAX el) is a chemotherapy drug. It targets fast dividing cells, like cancer cells, and causes these cells to die. This medicine is used to treat ovarian cancer, breast cancer, lung cancer, Kaposi's sarcoma, and other cancers. This medicine may be used for other purposes; ask  your health care provider or pharmacist if you have questions. COMMON BRAND NAME(S): Onxol, Taxol What should I tell my care team before I take this medication? They need to know if you have any of these conditions: history of irregular heartbeat liver disease low blood counts, like low  white cell, platelet, or red cell counts lung or breathing disease, like asthma tingling of the fingers or toes, or other nerve disorder an unusual or allergic reaction to paclitaxel, alcohol, polyoxyethylated castor oil, other chemotherapy, other medicines, foods, dyes, or preservatives pregnant or trying to get pregnant breast-feeding How should I use this medication? This drug is given as an infusion into a vein. It is administered in a hospital or clinic by a specially trained health care professional. Talk to your pediatrician regarding the use of this medicine in children. Special care may be needed. Overdosage: If you think you have taken too much of this medicine contact a poison control center or emergency room at once. NOTE: This medicine is only for you. Do not share this medicine with others. What if I miss a dose? It is important not to miss your dose. Call your doctor or health care professional if you are unable to keep an appointment. What may interact with this medication? Do not take this medicine with any of the following medications: live virus vaccines This medicine may also interact with the following medications: antiviral medicines for hepatitis, HIV or AIDS certain antibiotics like erythromycin and clarithromycin certain medicines for fungal infections like ketoconazole and itraconazole certain medicines for seizures like carbamazepine, phenobarbital, phenytoin gemfibrozil nefazodone rifampin St. John's wort This list may not describe all possible interactions. Give your health care provider a list of all the medicines, herbs, non-prescription drugs, or dietary supplements you use. Also tell them if you smoke, drink alcohol, or use illegal drugs. Some items may interact with your medicine. What should I watch for while using this medication? Your condition will be monitored carefully while you are receiving this medicine. You will need important blood work done  while you are taking this medicine. This medicine can cause serious allergic reactions. To reduce your risk you will need to take other medicine(s) before treatment with this medicine. If you experience allergic reactions like skin rash, itching or hives, swelling of the face, lips, or tongue, tell your doctor or health care professional right away. In some cases, you may be given additional medicines to help with side effects. Follow all directions for their use. This drug may make you feel generally unwell. This is not uncommon, as chemotherapy can affect healthy cells as well as cancer cells. Report any side effects. Continue your course of treatment even though you feel ill unless your doctor tells you to stop. Call your doctor or health care professional for advice if you get a fever, chills or sore throat, or other symptoms of a cold or flu. Do not treat yourself. This drug decreases your body's ability to fight infections. Try to avoid being around people who are sick. This medicine may increase your risk to bruise or bleed. Call your doctor or health care professional if you notice any unusual bleeding. Be careful brushing and flossing your teeth or using a toothpick because you may get an infection or bleed more easily. If you have any dental work done, tell your dentist you are receiving this medicine. Avoid taking products that contain aspirin, acetaminophen, ibuprofen, naproxen, or ketoprofen unless instructed by your doctor. These medicines may hide a  fever. Do not become pregnant while taking this medicine. Women should inform their doctor if they wish to become pregnant or think they might be pregnant. There is a potential for serious side effects to an unborn child. Talk to your health care professional or pharmacist for more information. Do not breast-feed an infant while taking this medicine. Men are advised not to father a child while receiving this medicine. This product may contain  alcohol. Ask your pharmacist or healthcare provider if this medicine contains alcohol. Be sure to tell all healthcare providers you are taking this medicine. Certain medicines, like metronidazole and disulfiram, can cause an unpleasant reaction when taken with alcohol. The reaction includes flushing, headache, nausea, vomiting, sweating, and increased thirst. The reaction can last from 30 minutes to several hours. What side effects may I notice from receiving this medication? Side effects that you should report to your doctor or health care professional as soon as possible: allergic reactions like skin rash, itching or hives, swelling of the face, lips, or tongue breathing problems changes in vision fast, irregular heartbeat high or low blood pressure mouth sores pain, tingling, numbness in the hands or feet signs of decreased platelets or bleeding - bruising, pinpoint red spots on the skin, black, tarry stools, blood in the urine signs of decreased red blood cells - unusually weak or tired, feeling faint or lightheaded, falls signs of infection - fever or chills, cough, sore throat, pain or difficulty passing urine signs and symptoms of liver injury like dark yellow or brown urine; general ill feeling or flu-like symptoms; light-colored stools; loss of appetite; nausea; right upper belly pain; unusually weak or tired; yellowing of the eyes or skin swelling of the ankles, feet, hands unusually slow heartbeat Side effects that usually do not require medical attention (report to your doctor or health care professional if they continue or are bothersome): diarrhea hair loss loss of appetite muscle or joint pain nausea, vomiting pain, redness, or irritation at site where injected tiredness This list may not describe all possible side effects. Call your doctor for medical advice about side effects. You may report side effects to FDA at 1-800-FDA-1088. Where should I keep my medication? This drug  is given in a hospital or clinic and will not be stored at home. NOTE: This sheet is a summary. It may not cover all possible information. If you have questions about this medicine, talk to your doctor, pharmacist, or health care provider.  2022 Elsevier/Gold Standard (2021-01-28 00:00:00)

## 2021-07-19 ENCOUNTER — Encounter: Payer: Self-pay | Admitting: Oncology

## 2021-07-19 NOTE — Progress Notes (Signed)
Hematology/Oncology Consult note [facility]  [phone] [phone]  Patient Care Team: Derinda Late, MD as PCP - General (Family Medicine) Sindy Guadeloupe, MD as Consulting Physician (Hematology and Oncology)   Name of the patient: Emma Cuevas  [id number]  March 28, 1966   Date of visit: 07/19/21  Diagnosis- clinical prognostic stage IIIa right breast cancer T3 N1 M0 ER/PR positive HER2 negative    Chief complaint/ Reason for visit-on treatment assessment prior to cycle 7 of weekly Taxol chemotherapy  Heme/Onc history: Patient is a 56 year old female who underwent a routine bilateral screening mammogram in September 2022 which showed calcifications in the right breast and prominent right axillary lymph node concerning for malignancy.  This was followed by diagnostic mammogram and ultrasound.  Mammogram showed extensive suspicious pleomorphic calcifications measuring up to 6.8 cm.  2 abnormal lymph nodes were seen in the right axilla on ultrasound as well.  Both the calcifications and the lymph node was biopsied.  Anterior and posterior end of the calcifications was consistent with invasive mammary carcinoma grade 3.  Lymph node was positive for metastatic carcinoma as well.  ER 91 to 100% positive PR 41 to 50% positive and HER2 negative.  Ki-67 40%   Menarche at the age of 62.  She is G2, P2 L2.  Age at first birth 37.  She used birth control pills for about 8 to 10 years.  She had a hysterectomy at the age of 68 but ovaries are still in situ.  Family history significant for breast cancer in her mother and paternal aunt.  Father with metastatic bladder cancer and maternal grandfather with pancreatic cancer.  Patient had BRCA 1 and 2 testing done in the past and was reportedly negative.   MRI bilateral breasts showed non-mass enhancement involving the upper outer lower outer and upper inner quadrants finding 10.9 x 8.4 x 6.8 cm.  Non-mass  enhancement in the left breast spanning 1.6 x 1.3 x 0.8 cm.  6 metastatic right axillary lymph nodes.   CT chest abdomen and pelvis with contrast showed mild right axillary adenopathy compatible with metastatic disease but no evidence of distant metastatic disease.  5 mm left lower lobe nodule.  Bone scan was negative for metastatic disease. Patient is receiving neoadjuvant chemotherapy with dose dense AC followed by weekly Taxol.  Interim MRI has shown good response to treatment as evidenced by normalization of the axillary lymph nodes as well as decreased enhancement uncalled fluency of tumor volume  Interval history-patient is tolerating Taxol well so far.  Denies any significant tingling numbness in her extremities.  Continues to do a full-time job from home.  Reports mild fatigue which is at her baseline.  ECOG PS- 1 Pain scale- 0   Review of systems- Review of Systems  Constitutional:  Positive for malaise/fatigue. Negative for chills, fever and weight loss.  HENT:  Negative for congestion, ear discharge and nosebleeds.   Eyes:  Negative for blurred vision.  Respiratory:  Negative for cough, hemoptysis, sputum production, shortness of breath and wheezing.   Cardiovascular:  Negative for chest pain, palpitations, orthopnea and claudication.  Gastrointestinal:  Negative for abdominal pain, blood in stool, constipation, diarrhea, heartburn, melena, nausea and vomiting.  Genitourinary:  Negative for dysuria, flank pain, frequency, hematuria and urgency.  Musculoskeletal:  Negative for back pain, joint pain and myalgias.  Skin:  Negative for rash.  Neurological:  Negative for dizziness, tingling, focal weakness, seizures, weakness and headaches.  Endo/Heme/Allergies:  Does not bruise/bleed easily.  Psychiatric/Behavioral:  Negative for depression and suicidal ideas. The patient does not have insomnia.      Allergies  Allergen Reactions   Tape     Redness and burning     Past Medical  History:  Diagnosis Date   Anemia    Benign neoplasm of breast 2013   left breast   BRCA negative 2015   Breast cancer ([place])    Cancer ([place])    Family history of bladder cancer    Family history of breast cancer    Family history of malignant neoplasm of breast 2013   Family history of pancreatic cancer    Screening for obesity      Past Surgical History:  Procedure Laterality Date   ABDOMINAL HYSTERECTOMY  2011   partial   BREAST BIOPSY Left 2013   BREAST BIOPSY Right 03/14/2021   Stereo bx-anterior calcs, "coil" clip-path pending   BREAST BIOPSY Right 03/14/2021   stereo bx-calcs, "Ribbon" clip-path pending   breast biopsy Right 03/14/2021   [place] Bx, Axilla, path pending   BREAST BIOPSY Left 04/30/2021   BREAST SURGERY Left 10/16/2011   left breast finesse biopsy   COLONOSCOPY WITH PROPOFOL N/A 12/05/2014   Procedure: COLONOSCOPY WITH PROPOFOL;  Surgeon: Christene Lye, MD;  Location: [facility] ENDOSCOPY;  Service: Endoscopy;  Laterality: N/A;   DILATION AND CURETTAGE OF UTERUS     IR CV LINE INJECTION  05/06/2021   IR IMAGING GUIDED PORT INSERTION  05/08/2021   PORTACATH PLACEMENT N/A 04/02/2021   Procedure: INSERTION PORT-A-CATH;  Surgeon: Herbert Pun, MD;  Location: [facility] ORS;  Service: General;  Laterality: N/A;    Social History   Socioeconomic History   Marital status: Married    Spouse name: Legrand Como   Number of children: Not on file   Years of education: Not on file   Highest education level: Not on file  Occupational History   Not on file  Tobacco Use   Smoking status: Never   Smokeless tobacco: Never  Vaping Use   Vaping Use: Never used  Substance and Sexual Activity   Alcohol use: No   Drug use: No   Sexual activity: Not on file  Other Topics Concern   Not on file  Social History Narrative   Not on file   Social Determinants of Health   Financial Resource Strain: Not on file  Food Insecurity: Not on file  Transportation Needs:  Not on file  Physical Activity: Not on file  Stress: Not on file  Social Connections: Not on file  Intimate Partner Violence: Not on file    Family History  Problem Relation Age of Onset   Breast cancer Mother 10   Bladder Cancer Father 81   Breast cancer Paternal Aunt 54   Pancreatic cancer Paternal Uncle 24   Pancreatic cancer Maternal Grandfather 41   Cancer Cousin        in ear     Current Outpatient Medications:    acetaminophen (TYLENOL) 500 MG tablet, Take 1,000 mg by mouth every 6 (six) hours as needed for moderate pain., Disp: , Rfl:    Calcium Carb-Cholecalciferol 500-10 MG-MCG TABS, Take 1 tablet by mouth every morning., Disp: , Rfl:    lidocaine-prilocaine (EMLA) cream, Apply to affected area once, Disp: 30 g, Rfl: 3   LORazepam (ATIVAN) 0.5 MG tablet, Take 1 tablet (0.5 mg total) by mouth every 6 (six) hours as needed (Nausea or vomiting)., Disp: 30 tablet,  Rfl: 0   ondansetron (ZOFRAN) 8 MG tablet, Take 1 tablet (8 mg total) by mouth 2 (two) times daily as needed. Start on the third day after chemotherapy., Disp: 30 tablet, Rfl: 1   prochlorperazine (COMPAZINE) 10 MG tablet, Take 1 tablet (10 mg total) by mouth every 6 (six) hours as needed (Nausea or vomiting)., Disp: 30 tablet, Rfl: 1   ZIEXTENZO 6 MG/0.6ML injection, INJECT THE CONTENTS OF ONE SYRINGE (6 MG) UNDER THE SKIN 24 TO 72 HOURS AFTER CHEMO EVERY 14 DAYS, Disp: 0.6 mL, Rfl: 1   triamcinolone ointment (KENALOG) 0.5 %, Apply 1 application topically 2 (two) times daily. OVER THE PORT SITE FOR REDNESS (Patient not taking: Reported on 07/18/2021), Disp: 30 g, Rfl: 0  Physical exam:  Vitals:   07/18/21 1052  BP: (!) 118/55  Pulse: 88  Resp: 20  Temp: 97.8 F (36.6 C)  SpO2: 100%  Weight: 183 lb 1.6 oz (83.1 kg)   Physical Exam Constitutional:      General: She is not in acute distress. Cardiovascular:     Rate and Rhythm: Normal rate and regular rhythm.     Heart sounds: Normal heart sounds.   Pulmonary:     Effort: Pulmonary effort is normal.     Breath sounds: Normal breath sounds.  Skin:    General: Skin is warm and dry.  Neurological:     Mental Status: She is alert and oriented to person, place, and time.     CMP Latest Ref Rng & Units 07/18/2021  Glucose 70 - 99 mg/dL 101(H)  BUN 6 - 20 mg/dL 16  Creatinine 0.44 - 1.00 mg/dL 0.71  Sodium 135 - 145 mmol/L 137  Potassium 3.5 - 5.1 mmol/L 4.0  Chloride 98 - 111 mmol/L 104  CO2 22 - 32 mmol/L 25  Calcium 8.9 - 10.3 mg/dL 9.5  Total Protein 6.5 - 8.1 g/dL 6.8  Total Bilirubin 0.3 - 1.2 mg/dL 0.3  Alkaline Phos 38 - 126 U/L 57  AST 15 - 41 U/L 28  ALT 0 - 44 U/L 22   CBC Latest Ref Rng & Units 07/18/2021  WBC 4.0 - 10.5 K/uL 3.2(L)  Hemoglobin 12.0 - 15.0 g/dL 8.3(L)  Hematocrit 36.0 - 46.0 % 26.2(L)  Platelets 150 - 400 K/uL 253    No images are attached to the encounter.  MR BREAST BILATERAL W WO CONTRAST INC CAD  Result Date: 06/30/2021 CLINICAL DATA:  56 year old female for evaluation of RIGHT breast cancer neoadjuvant treatment response. History of benign LEFT breast biopsy. EXAM: BILATERAL BREAST MRI WITH AND WITHOUT CONTRAST TECHNIQUE: Multiplanar, multisequence MR images of both breasts were obtained prior to and following the intravenous administration of 7.5 ml of Gadavist Three-dimensional MR images were rendered by post-processing of the original MR data on an independent workstation. The three-dimensional MR images were interpreted, and findings are reported in the following complete MRI report for this study. Three dimensional images were evaluated at the independent interpreting workstation using the DynaCAD thin client. COMPARISON:  03/27/2021 breast MR and prior studies FINDINGS: Breast composition: c. Heterogeneous fibroglandular tissue. Background parenchymal enhancement: Minimal Right breast: Non masslike enhancement within the UPPER OUTER, LOWER OUTER and UPPER INNER RIGHT breast again identified.  Although the area involved is unchanged measuring 10.9 x 8.4 x 6.8 cm, there is significantly decreased enhancement and confluency/solid tumor volume. Biopsy clip artifact within the LOWER OUTER RIGHT breast again noted. No new or increasing suspicious enhancement/mass is noted. Left breast: Biopsy-proven non  masslike enhancement within the LOWER OUTER LEFT breast is again noted. Lymph nodes: No abnormal appearing lymph nodes. Ancillary findings:  None. IMPRESSION: 1. Treatment response of known RIGHT breast malignancy. Although the measured area unchanged, there is significantly decreased enhancement and confluency/solid tumor volume. 2. Previously identified abnormal RIGHT axillary lymph nodes, now all have a normal appearance. 3. No new or progressive disease identified. RECOMMENDATION: Treatment plan BI-RADS CATEGORY  6: Known biopsy-proven malignancy. Electronically Signed   By: Margarette Canada M.D.   On: 06/30/2021 14:11    Assessment and plan- Patient is a 56 y.o. female  with newly diagnosed clinical prognostic stage IIIa invasive mammary carcinoma of the right breast cT3 N1 M0 ER/PR positive HER2 negative.  She is s/p 4 cycles of neoadjuvant dose dense AC chemotherapy.  She is here for on treatment assessment prior to cycle 7 of weekly Taxol chemotherapy  Counts okay to proceed with cycle 7 of weekly Taxol chemotherapy today.  She is receiving full dose of 80 mg per metered square as she is tolerating it well without any significant side effects to his peripheral neuropathy.  She willDirectly proceed for cycle 8 next week and I will see her back in 2 weeks for cycle 9.    Patient did meet with Dr. Peyton Najjar and plan is for a unilateral mastectomy with immediate reconstruction upon completion of neoadjuvant chemotherapy.  Explained to the patient that given that she has lymph node positive disease she will require postmastectomy radiation.   Visit Diagnosis 1. Encounter for antineoplastic chemotherapy    2. Antineoplastic chemotherapy induced anemia   3. Breast cancer, stage 1, estrogen receptor positive, right ([place])      Dr. Randa Evens, MD, MPH [facility] [phone] 07/19/2021 12:34 PM

## 2021-07-25 ENCOUNTER — Inpatient Hospital Stay: Payer: Managed Care, Other (non HMO) | Attending: Oncology

## 2021-07-25 ENCOUNTER — Other Ambulatory Visit: Payer: Self-pay

## 2021-07-25 ENCOUNTER — Inpatient Hospital Stay: Payer: Managed Care, Other (non HMO)

## 2021-07-25 VITALS — BP 115/68 | HR 94 | Temp 98.7°F | Resp 20 | Wt 183.4 lb

## 2021-07-25 DIAGNOSIS — Z5111 Encounter for antineoplastic chemotherapy: Secondary | ICD-10-CM | POA: Diagnosis not present

## 2021-07-25 DIAGNOSIS — Z8052 Family history of malignant neoplasm of bladder: Secondary | ICD-10-CM | POA: Diagnosis not present

## 2021-07-25 DIAGNOSIS — D6481 Anemia due to antineoplastic chemotherapy: Secondary | ICD-10-CM | POA: Diagnosis not present

## 2021-07-25 DIAGNOSIS — T451X5A Adverse effect of antineoplastic and immunosuppressive drugs, initial encounter: Secondary | ICD-10-CM | POA: Insufficient documentation

## 2021-07-25 DIAGNOSIS — Z888 Allergy status to other drugs, medicaments and biological substances status: Secondary | ICD-10-CM | POA: Insufficient documentation

## 2021-07-25 DIAGNOSIS — R5383 Other fatigue: Secondary | ICD-10-CM | POA: Diagnosis not present

## 2021-07-25 DIAGNOSIS — R911 Solitary pulmonary nodule: Secondary | ICD-10-CM | POA: Insufficient documentation

## 2021-07-25 DIAGNOSIS — C50911 Malignant neoplasm of unspecified site of right female breast: Secondary | ICD-10-CM | POA: Diagnosis present

## 2021-07-25 DIAGNOSIS — Z170 Estrogen receptor positive status [ER+]: Secondary | ICD-10-CM

## 2021-07-25 DIAGNOSIS — Z800 Family history of malignant neoplasm of digestive organs: Secondary | ICD-10-CM | POA: Insufficient documentation

## 2021-07-25 DIAGNOSIS — C773 Secondary and unspecified malignant neoplasm of axilla and upper limb lymph nodes: Secondary | ICD-10-CM | POA: Insufficient documentation

## 2021-07-25 DIAGNOSIS — Z79899 Other long term (current) drug therapy: Secondary | ICD-10-CM | POA: Diagnosis not present

## 2021-07-25 DIAGNOSIS — Z803 Family history of malignant neoplasm of breast: Secondary | ICD-10-CM | POA: Insufficient documentation

## 2021-07-25 DIAGNOSIS — Z86018 Personal history of other benign neoplasm: Secondary | ICD-10-CM | POA: Diagnosis not present

## 2021-07-25 DIAGNOSIS — G620 Drug-induced polyneuropathy: Secondary | ICD-10-CM | POA: Diagnosis not present

## 2021-07-25 LAB — CBC WITH DIFFERENTIAL/PLATELET
Abs Immature Granulocytes: 0.07 10*3/uL (ref 0.00–0.07)
Basophils Absolute: 0 10*3/uL (ref 0.0–0.1)
Basophils Relative: 1 %
Eosinophils Absolute: 0.1 10*3/uL (ref 0.0–0.5)
Eosinophils Relative: 4 %
HCT: 25.7 % — ABNORMAL LOW (ref 36.0–46.0)
Hemoglobin: 8.3 g/dL — ABNORMAL LOW (ref 12.0–15.0)
Immature Granulocytes: 2 %
Lymphocytes Relative: 20 %
Lymphs Abs: 0.7 10*3/uL (ref 0.7–4.0)
MCH: 31.9 pg (ref 26.0–34.0)
MCHC: 32.3 g/dL (ref 30.0–36.0)
MCV: 98.8 fL (ref 80.0–100.0)
Monocytes Absolute: 0.4 10*3/uL (ref 0.1–1.0)
Monocytes Relative: 11 %
Neutro Abs: 2 10*3/uL (ref 1.7–7.7)
Neutrophils Relative %: 62 %
Platelets: 242 10*3/uL (ref 150–400)
RBC: 2.6 MIL/uL — ABNORMAL LOW (ref 3.87–5.11)
RDW: 16.8 % — ABNORMAL HIGH (ref 11.5–15.5)
WBC: 3.3 10*3/uL — ABNORMAL LOW (ref 4.0–10.5)
nRBC: 0.6 % — ABNORMAL HIGH (ref 0.0–0.2)

## 2021-07-25 LAB — COMPREHENSIVE METABOLIC PANEL
ALT: 16 U/L (ref 0–44)
AST: 34 U/L (ref 15–41)
Albumin: 3.5 g/dL (ref 3.5–5.0)
Alkaline Phosphatase: 53 U/L (ref 38–126)
Anion gap: 7 (ref 5–15)
BUN: 15 mg/dL (ref 6–20)
CO2: 24 mmol/L (ref 22–32)
Calcium: 8.8 mg/dL — ABNORMAL LOW (ref 8.9–10.3)
Chloride: 104 mmol/L (ref 98–111)
Creatinine, Ser: 0.91 mg/dL (ref 0.44–1.00)
GFR, Estimated: >60 mL/min (ref >60)
Glucose, Bld: 139 mg/dL — ABNORMAL HIGH (ref 70–99)
Potassium: 3.8 mmol/L (ref 3.5–5.1)
Sodium: 135 mmol/L (ref 135–145)
Total Bilirubin: 0.1 mg/dL — ABNORMAL LOW (ref 0.3–1.2)
Total Protein: 6.4 g/dL — ABNORMAL LOW (ref 6.5–8.1)

## 2021-07-25 MED ORDER — DIPHENHYDRAMINE HCL 50 MG/ML IJ SOLN
50.0000 mg | Freq: Once | INTRAMUSCULAR | Status: AC
  Administered 2021-07-25: 50 mg via INTRAVENOUS
  Filled 2021-07-25: qty 1

## 2021-07-25 MED ORDER — FAMOTIDINE IN NACL 20-0.9 MG/50ML-% IV SOLN
20.0000 mg | Freq: Once | INTRAVENOUS | Status: AC
  Administered 2021-07-25: 20 mg via INTRAVENOUS
  Filled 2021-07-25: qty 50

## 2021-07-25 MED ORDER — SODIUM CHLORIDE 0.9 % IV SOLN
80.0000 mg/m2 | Freq: Once | INTRAVENOUS | Status: AC
  Administered 2021-07-25: 156 mg via INTRAVENOUS
  Filled 2021-07-25: qty 26

## 2021-07-25 MED ORDER — SODIUM CHLORIDE 0.9 % IV SOLN
10.0000 mg | Freq: Once | INTRAVENOUS | Status: AC
  Administered 2021-07-25: 10 mg via INTRAVENOUS
  Filled 2021-07-25: qty 10

## 2021-07-25 MED ORDER — HEPARIN SOD (PORK) LOCK FLUSH 100 UNIT/ML IV SOLN
500.0000 [IU] | Freq: Once | INTRAVENOUS | Status: AC | PRN
  Administered 2021-07-25: 500 [IU]
  Filled 2021-07-25: qty 5

## 2021-07-25 MED ORDER — SODIUM CHLORIDE 0.9 % IV SOLN
Freq: Once | INTRAVENOUS | Status: AC
  Filled 2021-07-25: qty 250

## 2021-07-25 NOTE — Patient Instructions (Signed)
[facility] [place]  Discharge Instructions: Thank you for choosing [facility] to provide your oncology and hematology care.  If you have a lab appointment with the [facility], please go directly to the [place] [facility] and check in at the registration area.  Wear comfortable clothing and clothing appropriate for easy access to any Portacath or PICC line.   We strive to give you quality time with your provider. You may need to reschedule your appointment if you arrive late (15 or more minutes).  Arriving late affects you and other patients whose appointments are after yours.  Also, if you miss three or more appointments without notifying the office, you may be dismissed from the clinic at the providers discretion.      For prescription refill requests, have your pharmacy contact our office and allow 72 hours for refills to be completed.    Today you received the following chemotherapy and/or immunotherapy agents: Taxol      To help prevent nausea and vomiting after your treatment, we encourage you to take your nausea medication as directed.  BELOW ARE SYMPTOMS THAT SHOULD BE REPORTED IMMEDIATELY: *FEVER GREATER THAN 100.4 F (38 C) OR HIGHER *CHILLS OR SWEATING *NAUSEA AND VOMITING THAT IS NOT CONTROLLED WITH YOUR NAUSEA MEDICATION *UNUSUAL SHORTNESS OF BREATH *UNUSUAL BRUISING OR BLEEDING *URINARY PROBLEMS (pain or burning when urinating, or frequent urination) *BOWEL PROBLEMS (unusual diarrhea, constipation, pain near the anus) TENDERNESS IN MOUTH AND THROAT WITH OR WITHOUT PRESENCE OF ULCERS (sore throat, sores in mouth, or a toothache) UNUSUAL RASH, SWELLING OR PAIN  UNUSUAL VAGINAL DISCHARGE OR ITCHING   Items with * indicate a potential emergency and should be followed up as soon as possible or go to the Emergency Department if any problems should occur.  Please show the CHEMOTHERAPY ALERT CARD or IMMUNOTHERAPY ALERT CARD at check-in to the  Emergency Department and triage nurse.  Should you have questions after your visit or need to cancel or reschedule your appointment, please contact [facility] [place] [facility] [place]  [phone] and follow the prompts.  Office hours are 8:00 a.m. to 4:30 p.m. Monday - Friday. Please note that voicemails left after 4:00 p.m. may not be returned until the following business day.  We are closed weekends and major holidays. You have access to a nurse at all times for urgent questions. Please call the main number to the clinic [phone] and follow the prompts.  For any non-urgent questions, you may also contact your provider using MyChart. We now offer e-Visits for anyone 50 and older to request care online for non-urgent symptoms. For details visit [email or web address].   Also download the MyChart app! Go to the app store, search "MyChart", open the app, select [place], and log in with your MyChart username and password.  Due to Covid, a mask is required upon entering the hospital/clinic. If you do not have a mask, one will be given to you upon arrival. For doctor visits, patients may have 1 support person aged 37 or older with them. For treatment visits, patients cannot have anyone with them due to current Covid guidelines and our immunocompromised population. Paclitaxel injection What is this medication? PACLITAXEL (PAK li TAX el) is a chemotherapy drug. It targets fast dividing cells, like cancer cells, and causes these cells to die. This medicine is used to treat ovarian cancer, breast cancer, lung cancer, Kaposi's sarcoma, and other cancers. This medicine may be used for other purposes; ask your health  care provider or pharmacist if you have questions. COMMON BRAND NAME(S): Onxol, Taxol What should I tell my care team before I take this medication? They need to know if you have any of these conditions: history of irregular heartbeat liver disease low blood counts, like low  white cell, platelet, or red cell counts lung or breathing disease, like asthma tingling of the fingers or toes, or other nerve disorder an unusual or allergic reaction to paclitaxel, alcohol, polyoxyethylated castor oil, other chemotherapy, other medicines, foods, dyes, or preservatives pregnant or trying to get pregnant breast-feeding How should I use this medication? This drug is given as an infusion into a vein. It is administered in a hospital or clinic by a specially trained health care professional. Talk to your pediatrician regarding the use of this medicine in children. Special care may be needed. Overdosage: If you think you have taken too much of this medicine contact a poison control center or emergency room at once. NOTE: This medicine is only for you. Do not share this medicine with others. What if I miss a dose? It is important not to miss your dose. Call your doctor or health care professional if you are unable to keep an appointment. What may interact with this medication? Do not take this medicine with any of the following medications: live virus vaccines This medicine may also interact with the following medications: antiviral medicines for hepatitis, HIV or AIDS certain antibiotics like erythromycin and clarithromycin certain medicines for fungal infections like ketoconazole and itraconazole certain medicines for seizures like carbamazepine, phenobarbital, phenytoin gemfibrozil nefazodone rifampin St. John's wort This list may not describe all possible interactions. Give your health care provider a list of all the medicines, herbs, non-prescription drugs, or dietary supplements you use. Also tell them if you smoke, drink alcohol, or use illegal drugs. Some items may interact with your medicine. What should I watch for while using this medication? Your condition will be monitored carefully while you are receiving this medicine. You will need important blood work done  while you are taking this medicine. This medicine can cause serious allergic reactions. To reduce your risk you will need to take other medicine(s) before treatment with this medicine. If you experience allergic reactions like skin rash, itching or hives, swelling of the face, lips, or tongue, tell your doctor or health care professional right away. In some cases, you may be given additional medicines to help with side effects. Follow all directions for their use. This drug may make you feel generally unwell. This is not uncommon, as chemotherapy can affect healthy cells as well as cancer cells. Report any side effects. Continue your course of treatment even though you feel ill unless your doctor tells you to stop. Call your doctor or health care professional for advice if you get a fever, chills or sore throat, or other symptoms of a cold or flu. Do not treat yourself. This drug decreases your body's ability to fight infections. Try to avoid being around people who are sick. This medicine may increase your risk to bruise or bleed. Call your doctor or health care professional if you notice any unusual bleeding. Be careful brushing and flossing your teeth or using a toothpick because you may get an infection or bleed more easily. If you have any dental work done, tell your dentist you are receiving this medicine. Avoid taking products that contain aspirin, acetaminophen, ibuprofen, naproxen, or ketoprofen unless instructed by your doctor. These medicines may hide a fever. Do  not become pregnant while taking this medicine. Women should inform their doctor if they wish to become pregnant or think they might be pregnant. There is a potential for serious side effects to an unborn child. Talk to your health care professional or pharmacist for more information. Do not breast-feed an infant while taking this medicine. Men are advised not to father a child while receiving this medicine. This product may contain  alcohol. Ask your pharmacist or healthcare provider if this medicine contains alcohol. Be sure to tell all healthcare providers you are taking this medicine. Certain medicines, like metronidazole and disulfiram, can cause an unpleasant reaction when taken with alcohol. The reaction includes flushing, headache, nausea, vomiting, sweating, and increased thirst. The reaction can last from 30 minutes to several hours. What side effects may I notice from receiving this medication? Side effects that you should report to your doctor or health care professional as soon as possible: allergic reactions like skin rash, itching or hives, swelling of the face, lips, or tongue breathing problems changes in vision fast, irregular heartbeat high or low blood pressure mouth sores pain, tingling, numbness in the hands or feet signs of decreased platelets or bleeding - bruising, pinpoint red spots on the skin, black, tarry stools, blood in the urine signs of decreased red blood cells - unusually weak or tired, feeling faint or lightheaded, falls signs of infection - fever or chills, cough, sore throat, pain or difficulty passing urine signs and symptoms of liver injury like dark yellow or brown urine; general ill feeling or flu-like symptoms; light-colored stools; loss of appetite; nausea; right upper belly pain; unusually weak or tired; yellowing of the eyes or skin swelling of the ankles, feet, hands unusually slow heartbeat Side effects that usually do not require medical attention (report to your doctor or health care professional if they continue or are bothersome): diarrhea hair loss loss of appetite muscle or joint pain nausea, vomiting pain, redness, or irritation at site where injected tiredness This list may not describe all possible side effects. Call your doctor for medical advice about side effects. You may report side effects to FDA at 1-800-FDA-1088. Where should I keep my medication? This drug  is given in a hospital or clinic and will not be stored at home. NOTE: This sheet is a summary. It may not cover all possible information. If you have questions about this medicine, talk to your doctor, pharmacist, or health care provider.  2022 Elsevier/Gold Standard (2021-01-28 00:00:00)

## 2021-08-01 ENCOUNTER — Inpatient Hospital Stay: Payer: Managed Care, Other (non HMO)

## 2021-08-01 ENCOUNTER — Encounter: Payer: Self-pay | Admitting: Oncology

## 2021-08-01 ENCOUNTER — Inpatient Hospital Stay (HOSPITAL_BASED_OUTPATIENT_CLINIC_OR_DEPARTMENT_OTHER): Payer: Managed Care, Other (non HMO) | Admitting: Oncology

## 2021-08-01 ENCOUNTER — Other Ambulatory Visit: Payer: Self-pay

## 2021-08-01 VITALS — BP 122/60 | HR 100 | Temp 99.1°F | Resp 16 | Wt 184.1 lb

## 2021-08-01 DIAGNOSIS — C50911 Malignant neoplasm of unspecified site of right female breast: Secondary | ICD-10-CM

## 2021-08-01 DIAGNOSIS — Z170 Estrogen receptor positive status [ER+]: Secondary | ICD-10-CM

## 2021-08-01 DIAGNOSIS — Z5111 Encounter for antineoplastic chemotherapy: Secondary | ICD-10-CM | POA: Diagnosis not present

## 2021-08-01 DIAGNOSIS — D6481 Anemia due to antineoplastic chemotherapy: Secondary | ICD-10-CM | POA: Diagnosis not present

## 2021-08-01 DIAGNOSIS — G620 Drug-induced polyneuropathy: Secondary | ICD-10-CM

## 2021-08-01 DIAGNOSIS — D701 Agranulocytosis secondary to cancer chemotherapy: Secondary | ICD-10-CM | POA: Diagnosis not present

## 2021-08-01 DIAGNOSIS — T451X5A Adverse effect of antineoplastic and immunosuppressive drugs, initial encounter: Secondary | ICD-10-CM

## 2021-08-01 LAB — COMPREHENSIVE METABOLIC PANEL
ALT: 17 U/L (ref 0–44)
AST: 27 U/L (ref 15–41)
Albumin: 3.4 g/dL — ABNORMAL LOW (ref 3.5–5.0)
Alkaline Phosphatase: 52 U/L (ref 38–126)
Anion gap: 6 (ref 5–15)
BUN: 14 mg/dL (ref 6–20)
CO2: 24 mmol/L (ref 22–32)
Calcium: 8.9 mg/dL (ref 8.9–10.3)
Chloride: 107 mmol/L (ref 98–111)
Creatinine, Ser: 0.67 mg/dL (ref 0.44–1.00)
GFR, Estimated: >60 mL/min (ref >60)
Glucose, Bld: 122 mg/dL — ABNORMAL HIGH (ref 70–99)
Potassium: 3.6 mmol/L (ref 3.5–5.1)
Sodium: 137 mmol/L (ref 135–145)
Total Bilirubin: 0.5 mg/dL (ref 0.3–1.2)
Total Protein: 6.2 g/dL — ABNORMAL LOW (ref 6.5–8.1)

## 2021-08-01 LAB — CBC WITH DIFFERENTIAL/PLATELET
Abs Immature Granulocytes: 0.04 10*3/uL (ref 0.00–0.07)
Basophils Absolute: 0 10*3/uL (ref 0.0–0.1)
Basophils Relative: 1 %
Eosinophils Absolute: 0.1 10*3/uL (ref 0.0–0.5)
Eosinophils Relative: 5 %
HCT: 26.8 % — ABNORMAL LOW (ref 36.0–46.0)
Hemoglobin: 8.4 g/dL — ABNORMAL LOW (ref 12.0–15.0)
Immature Granulocytes: 2 %
Lymphocytes Relative: 22 %
Lymphs Abs: 0.6 10*3/uL — ABNORMAL LOW (ref 0.7–4.0)
MCH: 31.3 pg (ref 26.0–34.0)
MCHC: 31.3 g/dL (ref 30.0–36.0)
MCV: 100 fL (ref 80.0–100.0)
Monocytes Absolute: 0.3 10*3/uL (ref 0.1–1.0)
Monocytes Relative: 10 %
Neutro Abs: 1.7 10*3/uL (ref 1.7–7.7)
Neutrophils Relative %: 60 %
Platelets: 257 10*3/uL (ref 150–400)
RBC: 2.68 MIL/uL — ABNORMAL LOW (ref 3.87–5.11)
RDW: 16.5 % — ABNORMAL HIGH (ref 11.5–15.5)
WBC: 2.8 10*3/uL — ABNORMAL LOW (ref 4.0–10.5)
nRBC: 1.1 % — ABNORMAL HIGH (ref 0.0–0.2)

## 2021-08-01 MED ORDER — FAMOTIDINE IN NACL 20-0.9 MG/50ML-% IV SOLN
20.0000 mg | Freq: Once | INTRAVENOUS | Status: AC
  Administered 2021-08-01: 20 mg via INTRAVENOUS
  Filled 2021-08-01: qty 50

## 2021-08-01 MED ORDER — DIPHENHYDRAMINE HCL 50 MG/ML IJ SOLN
50.0000 mg | Freq: Once | INTRAMUSCULAR | Status: AC
  Administered 2021-08-01: 50 mg via INTRAVENOUS
  Filled 2021-08-01: qty 1

## 2021-08-01 MED ORDER — SODIUM CHLORIDE 0.9 % IV SOLN
60.0000 mg/m2 | Freq: Once | INTRAVENOUS | Status: AC
  Administered 2021-08-01: 120 mg via INTRAVENOUS
  Filled 2021-08-01: qty 20

## 2021-08-01 MED ORDER — HEPARIN SOD (PORK) LOCK FLUSH 100 UNIT/ML IV SOLN
INTRAVENOUS | Status: AC
  Filled 2021-08-01: qty 5

## 2021-08-01 MED ORDER — SODIUM CHLORIDE 0.9 % IV SOLN
Freq: Once | INTRAVENOUS | Status: AC
  Filled 2021-08-01: qty 250

## 2021-08-01 MED ORDER — SODIUM CHLORIDE 0.9 % IV SOLN
10.0000 mg | Freq: Once | INTRAVENOUS | Status: AC
  Administered 2021-08-01: 10 mg via INTRAVENOUS
  Filled 2021-08-01: qty 10

## 2021-08-01 NOTE — Progress Notes (Signed)
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  This was followed by diagnostic mammogram and ultrasound.  Mammogram showed extensive suspicious pleomorphic calcifications measuring up to 6.8 cm.  2 abnormal lymph nodes were seen in the right axilla on ultrasound as well.  Both the calcifications and the lymph node was biopsied.  Anterior and posterior end of the calcifications was consistent with invasive mammary carcinoma grade 3.  Lymph node was positive for metastatic carcinoma as well.  ER 91 to 100% positive PR 41 to 50% positive and HER2 negative.  Ki-67 40%   Menarche at the age of 76.  She is G2, P2 L2.  Age at first birth 51.  She used birth control pills for about 8 to 10 years.  She had a hysterectomy at the age of 69 but ovaries are still in situ.  Family history significant for breast cancer in her mother and paternal aunt.  Father with metastatic bladder cancer and maternal grandfather with pancreatic cancer.  Patient had BRCA 1 and 2 testing done in the past and was reportedly negative.   MRI bilateral breasts showed non-mass enhancement involving the upper outer lower outer and upper inner quadrants finding 10.9 x 8.4 x 6.8 cm.  Non-mass  enhancement in the left breast spanning 1.6 x 1.3 x 0.8 cm.  6 metastatic right axillary lymph nodes.   CT chest abdomen and pelvis with contrast showed mild right axillary adenopathy compatible with metastatic disease but no evidence of distant metastatic disease.  5 mm left lower lobe nodule.  Bone scan was negative for metastatic disease. Patient is receiving neoadjuvant chemotherapy with dose dense AC followed by weekly Taxol.  Interim MRI has shown good response to treatment as evidenced by normalization of the axillary lymph nodes as well as decreased enhancement uncalled fluency of tumor volume    Interval history-patient reports some mild persistent tingling numbness in her fingers and occasionally in her toes.  Has ongoing fatigue.  Had 3 to 4 days when she had significant  fatigue before things started getting better.  She is still working from home  ECOG PS- 1 Pain scale- 0  Review of systems- Review of Systems  Constitutional:  Positive for malaise/fatigue.  Neurological:  Positive for sensory change (Peripheral neuropathy).      Allergies  Allergen Reactions   Tape     Redness and burning     Past Medical History:  Diagnosis Date   Anemia    Benign neoplasm of breast 2013   left breast   BRCA negative 2015   Breast cancer ([place])    Cancer ([place])    Family history of bladder cancer    Family history of breast cancer    Family history of malignant neoplasm of breast 2013   Family history of pancreatic cancer  Screening for obesity      Past Surgical History:  Procedure Laterality Date   ABDOMINAL HYSTERECTOMY  2011   partial   BREAST BIOPSY Left 2013   BREAST BIOPSY Right 03/14/2021   Stereo bx-anterior calcs, "coil" clip-path pending   BREAST BIOPSY Right 03/14/2021   stereo bx-calcs, "Ribbon" clip-path pending   breast biopsy Right 03/14/2021   [place] Bx, Axilla, path pending   BREAST BIOPSY Left 04/30/2021   BREAST SURGERY Left 10/16/2011   left breast  finesse biopsy   COLONOSCOPY WITH PROPOFOL N/A 12/05/2014   Procedure: COLONOSCOPY WITH PROPOFOL;  Surgeon: Christene Lye, MD;  Location: [facility] ENDOSCOPY;  Service: Endoscopy;  Laterality: N/A;   DILATION AND CURETTAGE OF UTERUS     IR CV LINE INJECTION  05/06/2021   IR IMAGING GUIDED PORT INSERTION  05/08/2021   PORTACATH PLACEMENT N/A 04/02/2021   Procedure: INSERTION PORT-A-CATH;  Surgeon: Herbert Pun, MD;  Location: [facility] ORS;  Service: General;  Laterality: N/A;    Social History   Socioeconomic History   Marital status: Married    Spouse name: Legrand Como   Number of children: Not on file   Years of education: Not on file   Highest education level: Not on file  Occupational History   Not on file  Tobacco Use   Smoking status: Never   Smokeless tobacco: Never  Vaping Use   Vaping Use: Never used  Substance and Sexual Activity   Alcohol use: No   Drug use: No   Sexual activity: Not on file  Other Topics Concern   Not on file  Social History Narrative   Not on file   Social Determinants of Health   Financial Resource Strain: Not on file  Food Insecurity: Not on file  Transportation Needs: Not on file  Physical Activity: Not on file  Stress: Not on file  Social Connections: Not on file  Intimate Partner Violence: Not on file    Family History  Problem Relation Age of Onset   Breast cancer Mother 69   Bladder Cancer Father 51   Breast cancer Paternal Aunt 54   Pancreatic cancer Paternal Uncle 52   Pancreatic cancer Maternal Grandfather 100   Cancer Cousin        in ear     Current Outpatient Medications:    acetaminophen (TYLENOL) 500 MG tablet, Take 1,000 mg by mouth every 6 (six) hours as needed for moderate pain., Disp: , Rfl:    Calcium Carb-Cholecalciferol 500-10 MG-MCG TABS, Take 1 tablet by mouth every morning., Disp: , Rfl:    lidocaine-prilocaine (EMLA) cream, Apply to affected area once, Disp: 30 g, Rfl: 3   LORazepam (ATIVAN) 0.5 MG  tablet, Take 1 tablet (0.5 mg total) by mouth every 6 (six) hours as needed (Nausea or vomiting). (Patient not taking: Reported on 08/01/2021), Disp: 30 tablet, Rfl: 0   ondansetron (ZOFRAN) 8 MG tablet, Take 1 tablet (8 mg total) by mouth 2 (two) times daily as needed. Start on the third day after chemotherapy. (Patient not taking: Reported on 08/01/2021), Disp: 30 tablet, Rfl: 1   prochlorperazine (COMPAZINE) 10 MG tablet, Take 1 tablet (10 mg total) by mouth every 6 (six) hours as needed (Nausea or vomiting). (Patient not taking: Reported on 08/01/2021), Disp: 30 tablet, Rfl: 1   triamcinolone ointment (KENALOG) 0.5 %, Apply 1 application topically 2 (two) times daily. OVER THE PORT SITE FOR REDNESS (Patient not taking: Reported on 07/18/2021), Disp: 30 g, Rfl: 0  ZIEXTENZO 6 MG/0.6ML injection, INJECT THE CONTENTS OF ONE SYRINGE (6 MG) UNDER THE SKIN 24 TO 72 HOURS AFTER CHEMO EVERY 14 DAYS (Patient not taking: Reported on 08/01/2021), Disp: 0.6 mL, Rfl: 1  Physical exam:  Vitals:   08/01/21 0958  BP: 122/60  Pulse: 100  Resp: 16  Temp: 99.1 F (37.3 C)  SpO2: 100%  Weight: 184 lb 1.6 oz (83.5 kg)   Physical Exam Constitutional:      General: She is not in acute distress. Cardiovascular:     Rate and Rhythm: Normal rate and regular rhythm.     Heart sounds: Normal heart sounds.  Pulmonary:     Effort: Pulmonary effort is normal.     Breath sounds: Normal breath sounds.  Abdominal:     General: Bowel sounds are normal.     Palpations: Abdomen is soft.  Skin:    General: Skin is warm and dry.  Neurological:     Mental Status: She is alert and oriented to person, place, and time.     CMP Latest Ref Rng & Units 08/01/2021  Glucose 70 - 99 mg/dL 122(H)  BUN 6 - 20 mg/dL 14  Creatinine 0.44 - 1.00 mg/dL 0.67  Sodium 135 - 145 mmol/L 137  Potassium 3.5 - 5.1 mmol/L 3.6  Chloride 98 - 111 mmol/L 107  CO2 22 - 32 mmol/L 24  Calcium 8.9 - 10.3 mg/dL 8.9  Total Protein 6.5 - 8.1 g/dL  6.2(L)  Total Bilirubin 0.3 - 1.2 mg/dL 0.5  Alkaline Phos 38 - 126 U/L 52  AST 15 - 41 U/L 27  ALT 0 - 44 U/L 17   CBC Latest Ref Rng & Units 08/01/2021  WBC 4.0 - 10.5 K/uL 2.8(L)  Hemoglobin 12.0 - 15.0 g/dL 8.4(L)  Hematocrit 36.0 - 46.0 % 26.8(L)  Platelets 150 - 400 K/uL 257     Assessment and plan- Patient is a 56 y.o. female with newly diagnosed clinical prognostic stage IIIa invasive mammary carcinoma of the right breast cT3 N1 M0 ER/PR positive HER2 negative.  She is s/p 4 cycles of neoadjuvant dose dense AC chemotherapy.  She is here for on treatment assessment prior to cycle 9 of weekly Taxol chemotherapy  White cell count is 2.8 with ANC of 1.7.  She will proceed with Taxol chemotherapy this week.  If her [place] is less than 1.5 next week she will require 3 days of Zarxio.  She will get first dose in our clinic and subsequent doses at home.  She can get Taxol next week only if her [place] is more than 1.  Given her grade 1 peripheral neuropathy I am reducing the dose of Taxol to 60 mg per metered squared.  Chemo-induced anemia: Overall stable around 8.  Continue to monitor  I will see her back in 2 weeks for cycle 11 of weekly Taxol chemotherapy.  If her neuropathy gets worse I will consider holding off on subsequent cycles.   Visit Diagnosis 1. Encounter for antineoplastic chemotherapy   2. Breast cancer, stage 1, estrogen receptor positive, right ([place])   3. Chemotherapy induced neutropenia (H[facility])   4. Antineoplastic chemotherapy induced anemia      Dr. Randa Evens, MD, MPH [facility] at [facility] [phone] 08/01/2021 1:21 PM

## 2021-08-01 NOTE — Progress Notes (Signed)
Pt states she has been dealing with mouth sores recently but has not stopped her from eating just taking smaller bites. numbness and tingling in the last week both hands and feet. and will run up her calves. ?

## 2021-08-04 ENCOUNTER — Telehealth: Payer: Self-pay | Admitting: *Deleted

## 2021-08-04 NOTE — Telephone Encounter (Signed)
TISSUE EXPANDER PLACEMENT BREAST RECONSTRUCTION/ IMPLNT BIO IMPLNT FOR SOFT TISSUE REINFORCEMENT- Outpatient ?Date: 08/04/2021 ?[employer]~ [phone]~ telephone/ [phone]~ fax ?CPT 19357/ 30051 Dx: C50.911/ Z17.0 ?Representative: Murtis Sink. ?PA submitted.  ?Authorization: [id number] ?08/01/2021- 01/28/2022 ?[employer], LPN/ RSA  ?

## 2021-08-08 ENCOUNTER — Inpatient Hospital Stay: Payer: Managed Care, Other (non HMO)

## 2021-08-08 ENCOUNTER — Other Ambulatory Visit: Payer: Self-pay

## 2021-08-08 VITALS — BP 115/80 | HR 92 | Temp 98.8°F | Resp 17 | Wt 186.2 lb

## 2021-08-08 DIAGNOSIS — Z5111 Encounter for antineoplastic chemotherapy: Secondary | ICD-10-CM | POA: Diagnosis not present

## 2021-08-08 DIAGNOSIS — C50911 Malignant neoplasm of unspecified site of right female breast: Secondary | ICD-10-CM

## 2021-08-08 LAB — COMPREHENSIVE METABOLIC PANEL
ALT: 17 U/L (ref 0–44)
AST: 26 U/L (ref 15–41)
Albumin: 3.4 g/dL — ABNORMAL LOW (ref 3.5–5.0)
Alkaline Phosphatase: 48 U/L (ref 38–126)
Anion gap: 7 (ref 5–15)
BUN: 18 mg/dL (ref 6–20)
CO2: 22 mmol/L (ref 22–32)
Calcium: 8.9 mg/dL (ref 8.9–10.3)
Chloride: 108 mmol/L (ref 98–111)
Creatinine, Ser: 0.75 mg/dL (ref 0.44–1.00)
GFR, Estimated: >60 mL/min (ref >60)
Glucose, Bld: 120 mg/dL — ABNORMAL HIGH (ref 70–99)
Potassium: 3.8 mmol/L (ref 3.5–5.1)
Sodium: 137 mmol/L (ref 135–145)
Total Bilirubin: 0.6 mg/dL (ref 0.3–1.2)
Total Protein: 5.9 g/dL — ABNORMAL LOW (ref 6.5–8.1)

## 2021-08-08 LAB — CBC WITH DIFFERENTIAL/PLATELET
Abs Immature Granulocytes: 0.06 10*3/uL (ref 0.00–0.07)
Basophils Absolute: 0 10*3/uL (ref 0.0–0.1)
Basophils Relative: 1 %
Eosinophils Absolute: 0.1 10*3/uL (ref 0.0–0.5)
Eosinophils Relative: 4 %
HCT: 26.5 % — ABNORMAL LOW (ref 36.0–46.0)
Hemoglobin: 8.3 g/dL — ABNORMAL LOW (ref 12.0–15.0)
Immature Granulocytes: 2 %
Lymphocytes Relative: 23 %
Lymphs Abs: 0.8 10*3/uL (ref 0.7–4.0)
MCH: 31.6 pg (ref 26.0–34.0)
MCHC: 31.3 g/dL (ref 30.0–36.0)
MCV: 100.8 fL — ABNORMAL HIGH (ref 80.0–100.0)
Monocytes Absolute: 0.4 10*3/uL (ref 0.1–1.0)
Monocytes Relative: 11 %
Neutro Abs: 2 10*3/uL (ref 1.7–7.7)
Neutrophils Relative %: 59 %
Platelets: 241 10*3/uL (ref 150–400)
RBC: 2.63 MIL/uL — ABNORMAL LOW (ref 3.87–5.11)
RDW: 15.9 % — ABNORMAL HIGH (ref 11.5–15.5)
WBC: 3.4 10*3/uL — ABNORMAL LOW (ref 4.0–10.5)
nRBC: 0.6 % — ABNORMAL HIGH (ref 0.0–0.2)

## 2021-08-08 MED ORDER — SODIUM CHLORIDE 0.9 % IV SOLN
60.0000 mg/m2 | Freq: Once | INTRAVENOUS | Status: AC
  Administered 2021-08-08: 120 mg via INTRAVENOUS
  Filled 2021-08-08: qty 20

## 2021-08-08 MED ORDER — SODIUM CHLORIDE 0.9 % IV SOLN
Freq: Once | INTRAVENOUS | Status: AC
  Filled 2021-08-08: qty 250

## 2021-08-08 MED ORDER — FAMOTIDINE IN NACL 20-0.9 MG/50ML-% IV SOLN
20.0000 mg | Freq: Once | INTRAVENOUS | Status: AC
  Administered 2021-08-08: 20 mg via INTRAVENOUS
  Filled 2021-08-08: qty 50

## 2021-08-08 MED ORDER — HEPARIN SOD (PORK) LOCK FLUSH 100 UNIT/ML IV SOLN
INTRAVENOUS | Status: AC
  Filled 2021-08-08: qty 5

## 2021-08-08 MED ORDER — HEPARIN SOD (PORK) LOCK FLUSH 100 UNIT/ML IV SOLN
500.0000 [IU] | Freq: Once | INTRAVENOUS | Status: AC | PRN
  Administered 2021-08-08: 500 [IU]
  Filled 2021-08-08: qty 5

## 2021-08-08 MED ORDER — SODIUM CHLORIDE 0.9 % IV SOLN
10.0000 mg | Freq: Once | INTRAVENOUS | Status: AC
  Administered 2021-08-08: 10 mg via INTRAVENOUS
  Filled 2021-08-08: qty 10

## 2021-08-08 MED ORDER — DIPHENHYDRAMINE HCL 50 MG/ML IJ SOLN
50.0000 mg | Freq: Once | INTRAMUSCULAR | Status: AC
  Administered 2021-08-08: 50 mg via INTRAVENOUS
  Filled 2021-08-08: qty 1

## 2021-08-08 NOTE — Patient Instructions (Signed)
[facility]  Discharge Instructions: Thank you for choosing [facility] to provide your oncology and hematology care.  If you have a lab appointment with the Cancer Center, please go directly to the Cancer Center and check in at the registration area.  Wear comfortable clothing and clothing appropriate for easy access to any Portacath or PICC line.   We strive to give you quality time with your provider. You may need to reschedule your appointment if you arrive late (15 or more minutes).  Arriving late affects you and other patients whose appointments are after yours.  Also, if you miss three or more appointments without notifying the office, you may be dismissed from the clinic at the provider's discretion.      For prescription refill requests, have your pharmacy contact our office and allow 72 hours for refills to be completed.    Today you received the following chemotherapy and/or immunotherapy agents Taxol      To help prevent nausea and vomiting after your treatment, we encourage you to take your nausea medication as directed.  BELOW ARE SYMPTOMS THAT SHOULD BE REPORTED IMMEDIATELY: *FEVER GREATER THAN 100.4 F (38 C) OR HIGHER *CHILLS OR SWEATING *NAUSEA AND VOMITING THAT IS NOT CONTROLLED WITH YOUR NAUSEA MEDICATION *UNUSUAL SHORTNESS OF BREATH *UNUSUAL BRUISING OR BLEEDING *URINARY PROBLEMS (pain or burning when urinating, or frequent urination) *BOWEL PROBLEMS (unusual diarrhea, constipation, pain near the anus) TENDERNESS IN MOUTH AND THROAT WITH OR WITHOUT PRESENCE OF ULCERS (sore throat, sores in mouth, or a toothache) UNUSUAL RASH, SWELLING OR PAIN  UNUSUAL VAGINAL DISCHARGE OR ITCHING   Items with * indicate a potential emergency and should be followed up as soon as possible or go to the Emergency Department if any problems should occur.  Please show the CHEMOTHERAPY ALERT CARD or IMMUNOTHERAPY ALERT CARD at check-in to the  Emergency Department and triage nurse.  Should you have questions after your visit or need to cancel or reschedule your appointment, please contact [facility]  [phone] and follow the prompts.  Office hours are 8:00 a.m. to 4:30 p.m. Monday - Friday. Please note that voicemails left after 4:00 p.m. may not be returned until the following business day.  We are closed weekends and major holidays. You have access to a nurse at all times for urgent questions. Please call the main number to the clinic [phone] and follow the prompts.  For any non-urgent questions, you may also contact your provider using MyChart. We now offer e-Visits for anyone 18 and older to request care online for non-urgent symptoms. For details visit [email or web address].   Also download the MyChart app! Go to the app store, search "MyChart", open the app, select [facility], and log in with your MyChart username and password.  Due to Covid, a mask is required upon entering the hospital/clinic. If you do not have a mask, one will be given to you upon arrival. For doctor visits, patients may have 1 support person aged 18 or older with them. For treatment visits, patients cannot have anyone with them due to current Covid guidelines and our immunocompromised population.  

## 2021-08-15 ENCOUNTER — Inpatient Hospital Stay: Payer: Managed Care, Other (non HMO)

## 2021-08-15 ENCOUNTER — Other Ambulatory Visit: Payer: Self-pay

## 2021-08-15 ENCOUNTER — Encounter: Payer: Self-pay | Admitting: Oncology

## 2021-08-15 ENCOUNTER — Ambulatory Visit: Payer: Managed Care, Other (non HMO)

## 2021-08-15 ENCOUNTER — Other Ambulatory Visit: Payer: Managed Care, Other (non HMO)

## 2021-08-15 ENCOUNTER — Inpatient Hospital Stay: Payer: Managed Care, Other (non HMO) | Admitting: Oncology

## 2021-08-15 VITALS — BP 121/84 | HR 101 | Resp 18 | Wt 189.9 lb

## 2021-08-15 VITALS — HR 96 | Temp 96.9°F

## 2021-08-15 DIAGNOSIS — Z170 Estrogen receptor positive status [ER+]: Secondary | ICD-10-CM | POA: Diagnosis not present

## 2021-08-15 DIAGNOSIS — C50911 Malignant neoplasm of unspecified site of right female breast: Secondary | ICD-10-CM

## 2021-08-15 DIAGNOSIS — Z5111 Encounter for antineoplastic chemotherapy: Secondary | ICD-10-CM | POA: Diagnosis not present

## 2021-08-15 LAB — CBC WITH DIFFERENTIAL/PLATELET
Abs Immature Granulocytes: 0.05 10*3/uL (ref 0.00–0.07)
Basophils Absolute: 0 10*3/uL (ref 0.0–0.1)
Basophils Relative: 1 %
Eosinophils Absolute: 0.2 10*3/uL (ref 0.0–0.5)
Eosinophils Relative: 4 %
HCT: 29.6 % — ABNORMAL LOW (ref 36.0–46.0)
Hemoglobin: 9.2 g/dL — ABNORMAL LOW (ref 12.0–15.0)
Immature Granulocytes: 1 %
Lymphocytes Relative: 24 %
Lymphs Abs: 0.9 10*3/uL (ref 0.7–4.0)
MCH: 31.2 pg (ref 26.0–34.0)
MCHC: 31.1 g/dL (ref 30.0–36.0)
MCV: 100.3 fL — ABNORMAL HIGH (ref 80.0–100.0)
Monocytes Absolute: 0.3 10*3/uL (ref 0.1–1.0)
Monocytes Relative: 10 %
Neutro Abs: 2.1 10*3/uL (ref 1.7–7.7)
Neutrophils Relative %: 60 %
Platelets: 242 10*3/uL (ref 150–400)
RBC: 2.95 MIL/uL — ABNORMAL LOW (ref 3.87–5.11)
RDW: 15.8 % — ABNORMAL HIGH (ref 11.5–15.5)
WBC: 3.5 10*3/uL — ABNORMAL LOW (ref 4.0–10.5)
nRBC: 0 % (ref 0.0–0.2)

## 2021-08-15 LAB — COMPREHENSIVE METABOLIC PANEL
ALT: 21 U/L (ref 0–44)
AST: 36 U/L (ref 15–41)
Albumin: 3.6 g/dL (ref 3.5–5.0)
Alkaline Phosphatase: 50 U/L (ref 38–126)
Anion gap: 9 (ref 5–15)
BUN: 17 mg/dL (ref 6–20)
CO2: 23 mmol/L (ref 22–32)
Calcium: 9.1 mg/dL (ref 8.9–10.3)
Chloride: 106 mmol/L (ref 98–111)
Creatinine, Ser: 0.84 mg/dL (ref 0.44–1.00)
GFR, Estimated: >60 mL/min (ref >60)
Glucose, Bld: 125 mg/dL — ABNORMAL HIGH (ref 70–99)
Potassium: 3.8 mmol/L (ref 3.5–5.1)
Sodium: 138 mmol/L (ref 135–145)
Total Bilirubin: 0.3 mg/dL (ref 0.3–1.2)
Total Protein: 6.3 g/dL — ABNORMAL LOW (ref 6.5–8.1)

## 2021-08-15 MED ORDER — SODIUM CHLORIDE 0.9 % IV SOLN
60.0000 mg/m2 | Freq: Once | INTRAVENOUS | Status: AC
  Administered 2021-08-15: 120 mg via INTRAVENOUS
  Filled 2021-08-15: qty 20

## 2021-08-15 MED ORDER — SODIUM CHLORIDE 0.9% FLUSH
10.0000 mL | Freq: Once | INTRAVENOUS | Status: AC
  Administered 2021-08-15: 10 mL via INTRAVENOUS
  Filled 2021-08-15: qty 10

## 2021-08-15 MED ORDER — SODIUM CHLORIDE 0.9 % IV SOLN
Freq: Once | INTRAVENOUS | Status: AC
  Filled 2021-08-15: qty 250

## 2021-08-15 MED ORDER — HEPARIN SOD (PORK) LOCK FLUSH 100 UNIT/ML IV SOLN
INTRAVENOUS | Status: AC
  Administered 2021-08-15: 500 [IU] via INTRAVENOUS
  Filled 2021-08-15: qty 5

## 2021-08-15 MED ORDER — DIPHENHYDRAMINE HCL 50 MG/ML IJ SOLN
50.0000 mg | Freq: Once | INTRAMUSCULAR | Status: AC
  Administered 2021-08-15: 50 mg via INTRAVENOUS
  Filled 2021-08-15: qty 1

## 2021-08-15 MED ORDER — HEPARIN SOD (PORK) LOCK FLUSH 100 UNIT/ML IV SOLN
500.0000 [IU] | Freq: Once | INTRAVENOUS | Status: AC
  Filled 2021-08-15: qty 5

## 2021-08-15 MED ORDER — SODIUM CHLORIDE 0.9 % IV SOLN
10.0000 mg | Freq: Once | INTRAVENOUS | Status: AC
  Administered 2021-08-15: 10 mg via INTRAVENOUS
  Filled 2021-08-15: qty 10

## 2021-08-15 MED ORDER — FAMOTIDINE IN NACL 20-0.9 MG/50ML-% IV SOLN
20.0000 mg | Freq: Once | INTRAVENOUS | Status: AC
  Administered 2021-08-15: 20 mg via INTRAVENOUS
  Filled 2021-08-15: qty 50

## 2021-08-15 NOTE — Patient Instructions (Signed)
[facility] [place]   ?Discharge Instructions: ?Thank you for choosing [facility] to provide your oncology and hematology care.  ?If you have a lab appointment with the [facility], please go directly to the [facility] and check in at the registration area. ?  ?Wear comfortable clothing and clothing appropriate for easy access to any Portacath or PICC line.  ? ?We strive to give you quality time with your provider. You may need to reschedule your appointment if you arrive late (15 or more minutes).  Arriving late affects you and other patients whose appointments are after yours.  Also, if you miss three or more appointments without notifying the office, you may be dismissed from the clinic at the provider?s discretion.    ?  ?For prescription refill requests, have your pharmacy contact our office and allow 72 hours for refills to be completed.   ? ?Today you received the following chemotherapy and/or immunotherapy agents: Taxol.     ?  ?To help prevent nausea and vomiting after your treatment, we encourage you to take your nausea medication as directed. ? ?BELOW ARE SYMPTOMS THAT SHOULD BE REPORTED IMMEDIATELY: ?*FEVER GREATER THAN 100.4 F (38 ?C) OR HIGHER ?*CHILLS OR SWEATING ?*NAUSEA AND VOMITING THAT IS NOT CONTROLLED WITH YOUR NAUSEA MEDICATION ?*UNUSUAL SHORTNESS OF BREATH ?*UNUSUAL BRUISING OR BLEEDING ?*URINARY PROBLEMS (pain or burning when urinating, or frequent urination) ?*BOWEL PROBLEMS (unusual diarrhea, constipation, pain near the anus) ?TENDERNESS IN MOUTH AND THROAT WITH OR WITHOUT PRESENCE OF ULCERS (sore throat, sores in mouth, or a toothache) ?UNUSUAL RASH, SWELLING OR PAIN  ?UNUSUAL VAGINAL DISCHARGE OR ITCHING  ? ?Items with * indicate a potential emergency and should be followed up as soon as possible or go to the Emergency Department if any problems should occur. ? ?Please show the CHEMOTHERAPY ALERT CARD or IMMUNOTHERAPY ALERT CARD at check-in to  the Emergency Department and triage nurse. ? ?Should you have questions after your visit or need to cancel or reschedule your appointment, please contact [facility] [place]  Dept: [phone]  and follow the prompts.  Office hours are 8:00 a.m. to 4:30 p.m. Monday - Friday. Please note that voicemails left after 4:00 p.m. may not be returned until the following business day.  We are closed weekends and major holidays. You have access to a nurse at all times for urgent questions. Please call the main number to the clinic Dept: [phone] and follow the prompts. ? ?For any non-urgent questions, you may also contact your provider using MyChart. We now offer e-Visits for anyone 42 and older to request care online for non-urgent symptoms. For details visit [email or web address]. ?  ?Also download the MyChart app! Go to the app store, search "MyChart", open the app, select [facility], and log in with your MyChart username and password. ? ?Due to Covid, a mask is required upon entering the hospital/clinic. If you do not have a mask, one will be given to you upon arrival. For doctor visits, patients may have 1 support person aged 78 or older with them. For treatment visits, patients cannot have anyone with them due to current Covid guidelines and our immunocompromised population.  ?

## 2021-08-15 NOTE — Progress Notes (Signed)
? ? ? ?Hematology/Oncology Consult note ?[facility]  ?[phone] [phone] ? ?Patient Care Team: ?Derinda Late, MD as PCP - General (Family Medicine) ?Sindy Guadeloupe, MD as Consulting Physician (Hematology and Oncology)  ? ?Name of the patient: Emma Cuevas  ?[id number]  ?Jun 29, 1965  ? ?Date of visit: 08/15/21 ? 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  ? ?Chief complaint/ Reason for visit-on treatment assessment prior to cycle 11 of weekly Taxol chemotherapy ? ?Heme/Onc history: Patient is a 56 year old female who underwent a routine bilateral screening mammogram in September 2022 which showed calcifications in the right breast and prominent right axillary lymph node concerning for malignancy.  This was followed by diagnostic mammogram and ultrasound.  Mammogram showed extensive suspicious pleomorphic calcifications measuring up to 6.8 cm.  2 abnormal lymph nodes were seen in the right axilla on ultrasound as well.  Both the calcifications and the lymph node was biopsied.  Anterior and posterior end of the calcifications was consistent with invasive mammary carcinoma grade 3.  Lymph node was positive for metastatic carcinoma as well.  ER 91 to 100% positive PR 41 to 50% positive and HER2 negative.  Ki-67 40% ?  ?Menarche at the age of 39.  She is G2, P2 L2.  Age at first birth 41.  She used birth control pills for about 8 to 10 years.  She had a hysterectomy at the age of 19 but ovaries are still in situ.  Family history significant for breast cancer in her mother and paternal aunt.  Father with metastatic bladder cancer and maternal grandfather with pancreatic cancer.  Patient had BRCA 1 and 2 testing done in the past and was reportedly negative. ?  ?MRI bilateral breasts showed non-mass enhancement involving the upper outer lower outer and upper inner quadrants finding 10.9 x 8.4 x 6.8 cm.  Non-mass  enhancement in the left breast spanning 1.6 x 1.3 x 0.8 cm.  6 metastatic right axillary lymph nodes. ?  ?CT chest abdomen and pelvis with contrast showed mild right axillary adenopathy compatible with metastatic disease but no evidence of distant metastatic disease.  5 mm left lower lobe nodule.  Bone scan was negative for metastatic disease. ?Patient is receiving neoadjuvant chemotherapy with dose dense AC followed by weekly Taxol.  Interim MRI has shown good response to treatment as evidenced by normalization of the axillary lymph nodes as well as decreased enhancement uncalled fluency of tumor volume ?  ?  ? 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  Mild ongoing fatigue.  Denies any new complaints at this time ? ?ECOG PS- 1 ?Pain scale- 0 ? ? ?Review of systems- Review of Systems  ?Constitutional:  Positive for malaise/fatigue. Negative for chills, fever and weight loss.  ?HENT:  Negative for congestion, ear discharge and nosebleeds.   ?Eyes:  Negative for blurred vision.  ?Respiratory:  Negative for cough, hemoptysis, sputum production, shortness of breath and wheezing.   ?Cardiovascular:  Negative for chest pain, palpitations, orthopnea and claudication.  ?Gastrointestinal:  Negative for abdominal pain, blood in stool, constipation, diarrhea, heartburn, melena, nausea and vomiting.  ?Genitourinary:  Negative for dysuria, flank pain, frequency, hematuria and urgency.  ?Musculoskeletal:  Negative for back pain, joint pain and myalgias.  ?Skin:  Negative for rash.  ?Neurological:  Positive for sensory change (Peripheral neuropathy). Negative for dizziness, tingling, focal weakness, seizures, weakness and headaches.  ?  Endo/Heme/Allergies:  Does not bruise/bleed easily.  ?Psychiatric/Behavioral:  Negative for depression and suicidal ideas. The patient does not have insomnia.    ? ? ? ?Allergies  ?Allergen Reactions  ? Tape   ?  Redness and burning   ? ? ? ?Past Medical History:  ?Diagnosis Date  ? Anemia   ? Benign neoplasm of breast 2013  ? left breast  ? BRCA negative 2015  ? Breast cancer ([place])   ? Cancer [facility])   ? Family history of bladder cancer   ? Family history of breast cancer   ? Family history of malignant neoplasm of breast 2013  ? Family history of pancreatic cancer   ? Screening for obesity   ? ? ? ?Past Surgical History:  ?Procedure Laterality Date  ? ABDOMINAL HYSTERECTOMY  2011  ? partial  ? BREAST BIOPSY Left 2013  ? BREAST BIOPSY Right 03/14/2021  ? Stereo bx-anterior calcs, "coil" clip-path pending  ? BREAST BIOPSY Right 03/14/2021  ? stereo bx-calcs, "Ribbon" clip-path pending  ? breast biopsy Right 03/14/2021  ? [place] Bx, Axilla, path pending  ? BREAST BIOPSY Left 04/30/2021  ? BREAST SURGERY Left 10/16/2011  ? left breast finesse biopsy  ? COLONOSCOPY WITH PROPOFOL N/A 12/05/2014  ? Procedure: COLONOSCOPY WITH PROPOFOL;  Surgeon: Christene Lye, MD;  Location: [facility] ENDOSCOPY;  Service: Endoscopy;  Laterality: N/A;  ? DILATION AND CURETTAGE OF UTERUS    ? IR CV LINE INJECTION  05/06/2021  ? IR IMAGING GUIDED PORT INSERTION  05/08/2021  ? PORTACATH PLACEMENT N/A 04/02/2021  ? Procedure: INSERTION PORT-A-CATH;  Surgeon: Herbert Pun, MD;  Location: [facility] ORS;  Service: General;  Laterality: N/A;  ? ? ?Social History  ? ?Socioeconomic History  ? Marital status: Married  ?  Spouse name: Legrand Como  ? Number of children: Not on file  ? Years of education: Not on file  ? Highest education level: Not on file  ?Occupational History  ? Not on file  ?Tobacco Use  ? Smoking status: Never  ? Smokeless tobacco: Never  ?Vaping Use  ? Vaping Use: Never used  ?Substance and Sexual Activity  ? Alcohol use: No  ? Drug use: No  ? Sexual activity: Not on file  ?Other Topics Concern  ? Not on file  ?Social History Narrative  ? Not on file  ? ?Social Determinants of Health  ? ?Financial Resource Strain: Not on file  ?Food Insecurity: Not on file   ?Transportation Needs: Not on file  ?Physical Activity: Not on file  ?Stress: Not on file  ?Social Connections: Not on file  ?Intimate Partner Violence: Not on file  ? ? ?Family History  ?Problem Relation Age of Onset  ? Breast cancer Mother 63  ? Bladder Cancer Father 76  ? Breast cancer Paternal Aunt 49  ? Pancreatic cancer Paternal Uncle 67  ? Pancreatic cancer Maternal Grandfather 28  ? Cancer Cousin   ?     in ear  ? ? ? ?Current Outpatient Medications:  ?  acetaminophen (TYLENOL) 500 MG tablet, Take 1,000 mg by mouth every 6 (six) hours as needed for moderate pain., Disp: , Rfl:  ?  Calcium Carb-Cholecalciferol 500-10 MG-MCG TABS, Take 1 tablet by mouth every morning., Disp: , Rfl:  ?  lidocaine-prilocaine (EMLA) cream, Apply to affected area once, Disp: 30 g, Rfl: 3 ?  LORazepam (ATIVAN) 0.5 MG tablet, Take 1 tablet (0.5 mg total) by mouth every 6 (six) hours as needed (Nausea or vomiting). (  Patient not taking: Reported on 08/01/2021), Disp: 30 tablet, Rfl: 0 ?  ondansetron (ZOFRAN) 8 MG tablet, Take 1 tablet (8 mg total) by mouth 2 (two) times daily as needed. Start on the third day after chemotherapy. (Patient not taking: Reported on 08/01/2021), Disp: 30 tablet, Rfl: 1 ?  prochlorperazine (COMPAZINE) 10 MG tablet, Take 1 tablet (10 mg total) by mouth every 6 (six) hours as needed (Nausea or vomiting). (Patient not taking: Reported on 08/01/2021), Disp: 30 tablet, Rfl: 1 ?  triamcinolone ointment (KENALOG) 0.5 %, Apply 1 application topically 2 (two) times daily. OVER THE PORT SITE FOR REDNESS (Patient not taking: Reported on 07/18/2021), Disp: 30 g, Rfl: 0 ?  ZIEXTENZO 6 MG/0.6ML injection, INJECT THE CONTENTS OF ONE SYRINGE (6 MG) UNDER THE SKIN 24 TO 72 HOURS AFTER CHEMO EVERY 14 DAYS (Patient not taking: Reported on 08/01/2021), Disp: 0.6 mL, Rfl: 1 ? ?Physical exam:  ?Vitals:  ? 08/15/21 0853  ?BP: 121/84  ?Pulse: (!) 101  ?Resp: 18  ?Weight: 189 lb 14.4 oz (86.1 kg)  ? ?Physical Exam ?Constitutional:   ?    General: She is not in acute distress. ?Cardiovascular:  ?   Rate and Rhythm: Normal rate and regular rhythm.  ?   Heart sounds: Normal heart sounds.  ?Pulmonary:  ?   Effort: Pulmonary effort is normal.  ?   Breat

## 2021-08-15 NOTE — Progress Notes (Signed)
Pt will like to discuss her neuropathy. ?

## 2021-08-21 ENCOUNTER — Ambulatory Visit: Payer: Self-pay | Admitting: General Surgery

## 2021-08-21 ENCOUNTER — Other Ambulatory Visit: Payer: Self-pay | Admitting: General Surgery

## 2021-08-21 DIAGNOSIS — C50411 Malignant neoplasm of upper-outer quadrant of right female breast: Secondary | ICD-10-CM

## 2021-08-21 NOTE — H&P (Signed)
? ?HISTORY OF PRESENT ILLNESS:  ?  ?Emma Cuevas is a 56 y.o.female patient who comes for preoperative evaluation of surgical management for breast cancer. ? ?Patient diagnosed with advanced large (10 cm) mass on the right breast positive for invasive mammary carcinoma with biopsy-proven metastatic disease to axillary lymph nodes. ? ?She has been receiving neoadjuvant chemotherapy guided by medical oncology.  She has completed 4 cycles of neoadjuvant dose dense AC chemotherapy.  She will finish her 12 cycle of Taxol chemotherapy.  She has been tolerating well.  Her labs shows mild leukopenia with mild anemia.  Patient has responded with adequate increase with minimal intervention.  She had a follow-up MRI of the breast on June 30, 2021.  This showed decreased enhancement of the mass without significant decrease in size.  This suspect good response.  I personally evaluated the images. Today the patient endorses feeling well. ? ?Patient has already been evaluated by plastic and reconstructive surgery.  She has been oriented about immediate reconstruction with expander with subsequent implant placement. ?   ?  ?PAST MEDICAL HISTORY:  ?Past Medical History:  ?Diagnosis Date  ? BRCA negative   ? Breast mass, left   ? Benign  ?  ?  ?  ?PAST SURGICAL HISTORY:   ?Past Surgical History:  ?Procedure Laterality Date  ? Left Breast Biopsy Left 10/16/2011  ? benign  ? HYSTERECTOMY    ? still has ovaries  ?    ?   ?MEDICATIONS:  ?Outpatient Encounter Medications as of 08/21/2021  ?Medication Sig Dispense Refill  ? calcium carbonate-vitamin D3 500 mg-3.125 mcg (125 unit) per tablet Take 1 tablet by mouth every morning    ? metroNIDAZOLE (METROGEL) 0.75 % gel APPLY A SMALL AMOUNT TO AFFECTED AREA AS DIRECTED QD TO BID TO FACE FOR ROSACEA (Patient not taking: Reported on 07/10/2021)    ? ?No facility-administered encounter medications on file as of 08/21/2021.  ? ?  ?ALLERGIES:   ?Adhesive ?  ?SOCIAL HISTORY:  ?Social History   ? ?Socioeconomic History  ? Marital status: Married  ?Tobacco Use  ? Smoking status: Never  ? Smokeless tobacco: Never  ?Vaping Use  ? Vaping Use: Never used  ?Substance and Sexual Activity  ? Alcohol use: No  ? Drug use: No  ? Sexual activity: Defer  ? ? ?FAMILY HISTORY:  ?Family History  ?Problem Relation Age of Onset  ? Breast cancer Mother 9  ? Stroke Mother   ? Breast cancer Maternal Aunt 66  ? Colon cancer Maternal Grandfather   ? Diabetes type II Maternal Grandfather   ? Bladder Exstrophy Father   ? Cancer Father   ?     bladder  ? Stroke Father   ? Diabetes type II Father   ? Alcohol abuse Father   ? Diabetes type II Sister   ? Mental retardation Sister   ? Alcohol abuse Brother   ? Alcohol abuse Paternal Grandfather   ?  ? ?GENERAL REVIEW OF SYSTEMS:  ? ?General ROS: negative for - chills, fatigue, fever, weight gain or weight loss ?Allergy and Immunology ROS: negative for - hives  ?Hematological and Lymphatic ROS: negative for - bleeding problems or bruising, negative for palpable nodes ?Endocrine ROS: negative for - heat or cold intolerance, hair changes ?Respiratory ROS: negative for - cough, shortness of breath or wheezing ?Cardiovascular ROS: no chest pain or palpitations ?GI ROS: negative for nausea, vomiting, abdominal pain, diarrhea, constipation ?Musculoskeletal ROS: negative for - joint swelling or  muscle pain ?Neurological ROS: negative for - confusion, syncope ?Dermatological ROS: negative for pruritus and rash ? ?PHYSICAL EXAM:  ?Vitals:  ? 08/21/21 0857  ?BP: 112/75  ?Pulse: 90  ?.  ?Ht:167.6 cm (_0 ) Wt:82.1 kg (181 lb) SKA:JGOT surface area is 1.96 meters squared. ?Body mass index is 29.21 kg/m?Marland Kitchen. ?  ?GENERAL: Alert, active, oriented x3 ? ?HEENT: Pupils equal reactive to light. Extraocular movements are intact. Sclera clear. Palpebral conjunctiva normal red color.Pharynx clear. ? ?NECK: Supple with no palpable mass and no adenopathy. ? ?LUNGS: Sound clear with no rales rhonchi or  wheezes. ? ?HEART: Regular rhythm S1 and S2 without murmur. ? ?ABDOMEN: Soft and depressible, nontender with no palpable mass, no hepatomegaly.  ? ?BREAST: Both breasts were examined in the sitting and supine position.  There was no palpable mass, no skin changes, no nipple retraction, no nipple discharge.  There was no axillary adenopathy clinically palpable. ? ?EXTREMITIES: Well-developed well-nourished symmetrical with no dependent edema. ? ?NEUROLOGICAL: Awake alert oriented, facial expression symmetrical, moving all extremities. ?  ?   ?IMPRESSION:  ?  ? Malignant neoplasm of upper-outer quadrant of right female breast, unspecified estrogen receptor status (CMS-HCC) [C50.411] ?Stage IIIa invasive mammary carcinoma of the right breast cT3 N1 M0 ER/PR positive HER2 negative ?-Patient will complete chemotherapy on 3 07/23/2021.   ?-We had a long discussion about the alternative of partial mastectomy versus total mastectomy.  Due to the size of the nonenhancing mass even though there has been some response to the chemotherapy comparing the first MRI to the second MRI with decreased enhancement I considered that total mastectomy is the best option.  Patient high risk for having positive margins with partial mastectomy.  Also cosmetic results would not be acceptable.  I discussed with patient alternative of total mastectomy with immediate reconstruction.    She was evaluated by Dr. [name] and she would like to proceed with immediate reconstruction. ?-We discussed about the expectation and recovery after surgery.  We also discussed about the risk of surgery includes pain, bleeding, infection, ischemic of the mastectomy flaps, damage to adjacent structures such as muscle and chest wall, risk of anesthesia, among others.  The patient and the husband reports they understood and agreed to proceed.      ?  ?PLAN:  ?1.  Right breast total mastectomy with axillary sentinel node biopsy and excision of radiofrequency tag lymph  node versus complete axillary node dissection ([id number], [id number], [id number])  ?2.  Avoid taking any blood thinners such as aspirin, Plavix 5 days before surgery ?3.  Expect preoperative evaluation by Dr. [name] for final discussion of immediate reconstruction ?4.  Contact us if you have any concern ? ?Patient and her husband verbalized understanding, all questions were answered, and were agreeable with the plan outlined above.  ? ?Herbert Pun, MD ? ?Electronically signed by Herbert Pun, MD ?

## 2021-08-21 NOTE — H&P (View-Only) (Signed)
? ?HISTORY OF PRESENT ILLNESS:  ?  ?Emma Cuevas is a 56 y.o.female patient who comes for preoperative evaluation of surgical management for breast cancer. ? ?Patient diagnosed with advanced large (10 cm) mass on the right breast positive for invasive mammary carcinoma with biopsy-proven metastatic disease to axillary lymph nodes. ? ?She has been receiving neoadjuvant chemotherapy guided by medical oncology.  She has completed 4 cycles of neoadjuvant dose dense AC chemotherapy.  She will finish her 12 cycle of Taxol chemotherapy.  She has been tolerating well.  Her labs shows mild leukopenia with mild anemia.  Patient has responded with adequate increase with minimal intervention.  She had a follow-up MRI of the breast on June 30, 2021.  This showed decreased enhancement of the mass without significant decrease in size.  This suspect good response.  I personally evaluated the images. Today the patient endorses feeling well. ? ?Patient has already been evaluated by plastic and reconstructive surgery.  She has been oriented about immediate reconstruction with expander with subsequent implant placement. ?   ?  ?PAST MEDICAL HISTORY:  ?Past Medical History:  ?Diagnosis Date  ? BRCA negative   ? Breast mass, left   ? Benign  ?  ?  ?  ?PAST SURGICAL HISTORY:   ?Past Surgical History:  ?Procedure Laterality Date  ? Left Breast Biopsy Left 10/16/2011  ? benign  ? HYSTERECTOMY    ? still has ovaries  ?    ?   ?MEDICATIONS:  ?Outpatient Encounter Medications as of 08/21/2021  ?Medication Sig Dispense Refill  ? calcium carbonate-vitamin D3 500 mg-3.125 mcg (125 unit) per tablet Take 1 tablet by mouth every morning    ? metroNIDAZOLE (METROGEL) 0.75 % gel APPLY A SMALL AMOUNT TO AFFECTED AREA AS DIRECTED QD TO BID TO FACE FOR ROSACEA (Patient not taking: Reported on 07/10/2021)    ? ?No facility-administered encounter medications on file as of 08/21/2021.  ? ?  ?ALLERGIES:   ?Adhesive ?  ?SOCIAL HISTORY:  ?Social History   ? ?Socioeconomic History  ? Marital status: Married  ?Tobacco Use  ? Smoking status: Never  ? Smokeless tobacco: Never  ?Vaping Use  ? Vaping Use: Never used  ?Substance and Sexual Activity  ? Alcohol use: No  ? Drug use: No  ? Sexual activity: Defer  ? ? ?FAMILY HISTORY:  ?Family History  ?Problem Relation Age of Onset  ? Breast cancer Mother 9  ? Stroke Mother   ? Breast cancer Maternal Aunt 66  ? Colon cancer Maternal Grandfather   ? Diabetes type II Maternal Grandfather   ? Bladder Exstrophy Father   ? Cancer Father   ?     bladder  ? Stroke Father   ? Diabetes type II Father   ? Alcohol abuse Father   ? Diabetes type II Sister   ? Mental retardation Sister   ? Alcohol abuse Brother   ? Alcohol abuse Paternal Grandfather   ?  ? ?GENERAL REVIEW OF SYSTEMS:  ? ?General ROS: negative for - chills, fatigue, fever, weight gain or weight loss ?Allergy and Immunology ROS: negative for - hives  ?Hematological and Lymphatic ROS: negative for - bleeding problems or bruising, negative for palpable nodes ?Endocrine ROS: negative for - heat or cold intolerance, hair changes ?Respiratory ROS: negative for - cough, shortness of breath or wheezing ?Cardiovascular ROS: no chest pain or palpitations ?GI ROS: negative for nausea, vomiting, abdominal pain, diarrhea, constipation ?Musculoskeletal ROS: negative for - joint swelling or  muscle pain ?Neurological ROS: negative for - confusion, syncope ?Dermatological ROS: negative for pruritus and rash ? ?PHYSICAL EXAM:  ?Vitals:  ? 08/21/21 0857  ?BP: 112/75  ?Pulse: 90  ?.  ?Ht:167.6 cm (_0 ) Wt:82.1 kg (181 lb) SKA:JGOT surface area is 1.96 meters squared. ?Body mass index is 29.21 kg/m?Marland Kitchen. ?  ?GENERAL: Alert, active, oriented x3 ? ?HEENT: Pupils equal reactive to light. Extraocular movements are intact. Sclera clear. Palpebral conjunctiva normal red color.Pharynx clear. ? ?NECK: Supple with no palpable mass and no adenopathy. ? ?LUNGS: Sound clear with no rales rhonchi or  wheezes. ? ?HEART: Regular rhythm S1 and S2 without murmur. ? ?ABDOMEN: Soft and depressible, nontender with no palpable mass, no hepatomegaly.  ? ?BREAST: Both breasts were examined in the sitting and supine position.  There was no palpable mass, no skin changes, no nipple retraction, no nipple discharge.  There was no axillary adenopathy clinically palpable. ? ?EXTREMITIES: Well-developed well-nourished symmetrical with no dependent edema. ? ?NEUROLOGICAL: Awake alert oriented, facial expression symmetrical, moving all extremities. ?  ?   ?IMPRESSION:  ?  ? Malignant neoplasm of upper-outer quadrant of right female breast, unspecified estrogen receptor status (CMS-HCC) [C50.411] ?Stage IIIa invasive mammary carcinoma of the right breast cT3 N1 M0 ER/PR positive HER2 negative ?-Patient will complete chemotherapy on 3 07/23/2021.   ?-We had a long discussion about the alternative of partial mastectomy versus total mastectomy.  Due to the size of the nonenhancing mass even though there has been some response to the chemotherapy comparing the first MRI to the second MRI with decreased enhancement I considered that total mastectomy is the best option.  Patient high risk for having positive margins with partial mastectomy.  Also cosmetic results would not be acceptable.  I discussed with patient alternative of total mastectomy with immediate reconstruction.    She was evaluated by Dr. [name] and she would like to proceed with immediate reconstruction. ?-We discussed about the expectation and recovery after surgery.  We also discussed about the risk of surgery includes pain, bleeding, infection, ischemic of the mastectomy flaps, damage to adjacent structures such as muscle and chest wall, risk of anesthesia, among others.  The patient and the husband reports they understood and agreed to proceed.      ?  ?PLAN:  ?1.  Right breast total mastectomy with axillary sentinel node biopsy and excision of radiofrequency tag lymph  node versus complete axillary node dissection ([id number], [id number], [id number])  ?2.  Avoid taking any blood thinners such as aspirin, Plavix 5 days before surgery ?3.  Expect preoperative evaluation by Dr. [name] for final discussion of immediate reconstruction ?4.  Contact us if you have any concern ? ?Patient and her husband verbalized understanding, all questions were answered, and were agreeable with the plan outlined above.  ? ?Herbert Pun, MD ? ?Electronically signed by Herbert Pun, MD ?

## 2021-08-22 ENCOUNTER — Inpatient Hospital Stay: Payer: Managed Care, Other (non HMO)

## 2021-08-22 ENCOUNTER — Ambulatory Visit: Payer: Managed Care, Other (non HMO) | Admitting: Oncology

## 2021-08-22 ENCOUNTER — Other Ambulatory Visit: Payer: Managed Care, Other (non HMO)

## 2021-08-22 ENCOUNTER — Other Ambulatory Visit: Payer: Self-pay | Admitting: General Surgery

## 2021-08-22 ENCOUNTER — Inpatient Hospital Stay (HOSPITAL_BASED_OUTPATIENT_CLINIC_OR_DEPARTMENT_OTHER): Payer: Managed Care, Other (non HMO) | Admitting: Hospice and Palliative Medicine

## 2021-08-22 ENCOUNTER — Ambulatory Visit: Payer: Managed Care, Other (non HMO)

## 2021-08-22 VITALS — BP 132/86 | HR 80

## 2021-08-22 DIAGNOSIS — Z5111 Encounter for antineoplastic chemotherapy: Secondary | ICD-10-CM | POA: Diagnosis not present

## 2021-08-22 DIAGNOSIS — R21 Rash and other nonspecific skin eruption: Secondary | ICD-10-CM

## 2021-08-22 DIAGNOSIS — C50911 Malignant neoplasm of unspecified site of right female breast: Secondary | ICD-10-CM

## 2021-08-22 LAB — CBC WITH DIFFERENTIAL/PLATELET
Abs Immature Granulocytes: 0.02 10*3/uL (ref 0.00–0.07)
Basophils Absolute: 0 10*3/uL (ref 0.0–0.1)
Basophils Relative: 1 %
Eosinophils Absolute: 0.2 10*3/uL (ref 0.0–0.5)
Eosinophils Relative: 4 %
HCT: 30.2 % — ABNORMAL LOW (ref 36.0–46.0)
Hemoglobin: 9.4 g/dL — ABNORMAL LOW (ref 12.0–15.0)
Immature Granulocytes: 1 %
Lymphocytes Relative: 24 %
Lymphs Abs: 0.9 10*3/uL (ref 0.7–4.0)
MCH: 31.2 pg (ref 26.0–34.0)
MCHC: 31.1 g/dL (ref 30.0–36.0)
MCV: 100.3 fL — ABNORMAL HIGH (ref 80.0–100.0)
Monocytes Absolute: 0.4 10*3/uL (ref 0.1–1.0)
Monocytes Relative: 11 %
Neutro Abs: 2.2 10*3/uL (ref 1.7–7.7)
Neutrophils Relative %: 59 %
Platelets: 243 10*3/uL (ref 150–400)
RBC: 3.01 MIL/uL — ABNORMAL LOW (ref 3.87–5.11)
RDW: 15.6 % — ABNORMAL HIGH (ref 11.5–15.5)
WBC: 3.6 10*3/uL — ABNORMAL LOW (ref 4.0–10.5)
nRBC: 0 % (ref 0.0–0.2)

## 2021-08-22 LAB — COMPREHENSIVE METABOLIC PANEL
ALT: 15 U/L (ref 0–44)
AST: 25 U/L (ref 15–41)
Albumin: 3.4 g/dL — ABNORMAL LOW (ref 3.5–5.0)
Alkaline Phosphatase: 48 U/L (ref 38–126)
Anion gap: 5 (ref 5–15)
BUN: 13 mg/dL (ref 6–20)
CO2: 23 mmol/L (ref 22–32)
Calcium: 8.5 mg/dL — ABNORMAL LOW (ref 8.9–10.3)
Chloride: 107 mmol/L (ref 98–111)
Creatinine, Ser: 0.75 mg/dL (ref 0.44–1.00)
GFR, Estimated: >60 mL/min (ref >60)
Glucose, Bld: 118 mg/dL — ABNORMAL HIGH (ref 70–99)
Potassium: 3.6 mmol/L (ref 3.5–5.1)
Sodium: 135 mmol/L (ref 135–145)
Total Bilirubin: 0.3 mg/dL (ref 0.3–1.2)
Total Protein: 5.8 g/dL — ABNORMAL LOW (ref 6.5–8.1)

## 2021-08-22 MED ORDER — SODIUM CHLORIDE 0.9% FLUSH
10.0000 mL | INTRAVENOUS | Status: DC | PRN
  Filled 2021-08-22: qty 10

## 2021-08-22 MED ORDER — SODIUM CHLORIDE 0.9 % IV SOLN
10.0000 mg | Freq: Once | INTRAVENOUS | Status: AC
  Administered 2021-08-22: 10 mg via INTRAVENOUS
  Filled 2021-08-22: qty 10

## 2021-08-22 MED ORDER — CEPHALEXIN 500 MG PO CAPS: 500.0000 mg | ORAL_CAPSULE | Freq: Four times a day (QID) | ORAL | 0 refills | Status: DC

## 2021-08-22 MED ORDER — SODIUM CHLORIDE 0.9 % IV SOLN
60.0000 mg/m2 | Freq: Once | INTRAVENOUS | Status: AC
  Administered 2021-08-22: 120 mg via INTRAVENOUS
  Filled 2021-08-22: qty 20

## 2021-08-22 MED ORDER — FAMOTIDINE IN NACL 20-0.9 MG/50ML-% IV SOLN
20.0000 mg | Freq: Once | INTRAVENOUS | Status: AC
  Administered 2021-08-22: 20 mg via INTRAVENOUS
  Filled 2021-08-22: qty 50

## 2021-08-22 MED ORDER — HEPARIN SOD (PORK) LOCK FLUSH 100 UNIT/ML IV SOLN
500.0000 [IU] | Freq: Once | INTRAVENOUS | Status: AC | PRN
  Administered 2021-08-22: 500 [IU]
  Filled 2021-08-22: qty 5

## 2021-08-22 MED ORDER — SODIUM CHLORIDE 0.9 % IV SOLN
Freq: Once | INTRAVENOUS | Status: AC
  Filled 2021-08-22: qty 250

## 2021-08-22 MED ORDER — DIPHENHYDRAMINE HCL 50 MG/ML IJ SOLN
50.0000 mg | Freq: Once | INTRAMUSCULAR | Status: AC
  Administered 2021-08-22: 50 mg via INTRAVENOUS
  Filled 2021-08-22: qty 1

## 2021-08-22 NOTE — Patient Instructions (Signed)
[facility] CANCER CTR AT [place]  Discharge Instructions: ?Thank you for choosing [facility] to provide your oncology and hematology care.  ?If you have a lab appointment with the [facility], please go directly to the [facility] and check in at the registration area. ? ?Wear comfortable clothing and clothing appropriate for easy access to any Portacath or PICC line.  ? ?We strive to give you quality time with your provider. You may need to reschedule your appointment if you arrive late (15 or more minutes).  Arriving late affects you and other patients whose appointments are after yours.  Also, if you miss three or more appointments without notifying the office, you may be dismissed from the clinic at the provider?s discretion.    ?  ?For prescription refill requests, have your pharmacy contact our office and allow 72 hours for refills to be completed.   ? ?Today you received the following chemotherapy and/or immunotherapy agents TAXOL ?    ?  ?To help prevent nausea and vomiting after your treatment, we encourage you to take your nausea medication as directed. ? ?BELOW ARE SYMPTOMS THAT SHOULD BE REPORTED IMMEDIATELY: ?*FEVER GREATER THAN 100.4 F (38 ?C) OR HIGHER ?*CHILLS OR SWEATING ?*NAUSEA AND VOMITING THAT IS NOT CONTROLLED WITH YOUR NAUSEA MEDICATION ?*UNUSUAL SHORTNESS OF BREATH ?*UNUSUAL BRUISING OR BLEEDING ?*URINARY PROBLEMS (pain or burning when urinating, or frequent urination) ?*BOWEL PROBLEMS (unusual diarrhea, constipation, pain near the anus) ?TENDERNESS IN MOUTH AND THROAT WITH OR WITHOUT PRESENCE OF ULCERS (sore throat, sores in mouth, or a toothache) ?UNUSUAL RASH, SWELLING OR PAIN  ?UNUSUAL VAGINAL DISCHARGE OR ITCHING  ? ?Items with * indicate a potential emergency and should be followed up as soon as possible or go to the Emergency Department if any problems should occur. ? ?Please show the CHEMOTHERAPY ALERT CARD or IMMUNOTHERAPY ALERT CARD at check-in to the  Emergency Department and triage nurse. ? ?Should you have questions after your visit or need to cancel or reschedule your appointment, please contact [facility] CANCER [place] AT [place]  [phone] and follow the prompts.  Office hours are 8:00 a.m. to 4:30 p.m. Monday - Friday. Please note that voicemails left after 4:00 p.m. may not be returned until the following business day.  We are closed weekends and major holidays. You have access to a nurse at all times for urgent questions. Please call the main number to the clinic [phone] and follow the prompts. ? ?For any non-urgent questions, you may also contact your provider using MyChart. We now offer e-Visits for anyone 90 and older to request care online for non-urgent symptoms. For details visit [email or web address] ?  ?Also download the MyChart app! Go to the app store, search "MyChart", open the app, select [facility], and log in with your MyChart username and password. ? ?Due to Covid, a mask is required upon entering the hospital/clinic. If you do not have a mask, one will be given to you upon arrival. For doctor visits, patients may have 1 support person aged 110 or older with them. For treatment visits, patients cannot have anyone with them due to current Covid guidelines and our immunocompromised population.  ? ?Paclitaxel injection ?What is this medication? ?PACLITAXEL (PAK li TAX el) is a chemotherapy drug. It targets fast dividing cells, like cancer cells, and causes these cells to die. This medicine is used to treat ovarian cancer, breast cancer, lung cancer, Kaposi's sarcoma, and other cancers. ?This medicine may be used for other purposes;  ask your health care provider or pharmacist if you have questions. ?COMMON BRAND NAME(S): Onxol, Taxol ?What should I tell my care team before I take this medication? ?They need to know if you have any of these conditions: ?history of irregular heartbeat ?liver disease ?low blood counts, like low  white cell, platelet, or red cell counts ?lung or breathing disease, like asthma ?tingling of the fingers or toes, or other nerve disorder ?an unusual or allergic reaction to paclitaxel, alcohol, polyoxyethylated castor oil, other chemotherapy, other medicines, foods, dyes, or preservatives ?pregnant or trying to get pregnant ?breast-feeding ?How should I use this medication? ?This drug is given as an infusion into a vein. It is administered in a hospital or clinic by a specially trained health care professional. ?Talk to your pediatrician regarding the use of this medicine in children. Special care may be needed. ?Overdosage: If you think you have taken too much of this medicine contact a poison control center or emergency room at once. ?NOTE: This medicine is only for you. Do not share this medicine with others. ?What if I miss a dose? ?It is important not to miss your dose. Call your doctor or health care professional if you are unable to keep an appointment. ?What may interact with this medication? ?Do not take this medicine with any of the following medications: ?live virus vaccines ?This medicine may also interact with the following medications: ?antiviral medicines for hepatitis, HIV or AIDS ?certain antibiotics like erythromycin and clarithromycin ?certain medicines for fungal infections like ketoconazole and itraconazole ?certain medicines for seizures like carbamazepine, phenobarbital, phenytoin ?gemfibrozil ?nefazodone ?rifampin ?St. John's wort ?This list may not describe all possible interactions. Give your health care provider a list of all the medicines, herbs, non-prescription drugs, or dietary supplements you use. Also tell them if you smoke, drink alcohol, or use illegal drugs. Some items may interact with your medicine. ?What should I watch for while using this medication? ?Your condition will be monitored carefully while you are receiving this medicine. You will need important blood work done  while you are taking this medicine. ?This medicine can cause serious allergic reactions. To reduce your risk you will need to take other medicine(s) before treatment with this medicine. If you experience allergic reactions like skin rash, itching or hives, swelling of the face, lips, or tongue, tell your doctor or health care professional right away. ?In some cases, you may be given additional medicines to help with side effects. Follow all directions for their use. ?This drug may make you feel generally unwell. This is not uncommon, as chemotherapy can affect healthy cells as well as cancer cells. Report any side effects. Continue your course of treatment even though you feel ill unless your doctor tells you to stop. ?Call your doctor or health care professional for advice if you get a fever, chills or sore throat, or other symptoms of a cold or flu. Do not treat yourself. This drug decreases your body's ability to fight infections. Try to avoid being around people who are sick. ?This medicine may increase your risk to bruise or bleed. Call your doctor or health care professional if you notice any unusual bleeding. ?Be careful brushing and flossing your teeth or using a toothpick because you may get an infection or bleed more easily. If you have any dental work done, tell your dentist you are receiving this medicine. ?Avoid taking products that contain aspirin, acetaminophen, ibuprofen, naproxen, or ketoprofen unless instructed by your doctor. These medicines may hide  a fever. ?Do not become pregnant while taking this medicine. Women should inform their doctor if they wish to become pregnant or think they might be pregnant. There is a potential for serious side effects to an unborn child. Talk to your health care professional or pharmacist for more information. Do not breast-feed an infant while taking this medicine. ?Men are advised not to father a child while receiving this medicine. ?This product may contain  alcohol. Ask your pharmacist or healthcare provider if this medicine contains alcohol. Be sure to tell all healthcare providers you are taking this medicine. Certain medicines, like metronidazole and disu

## 2021-08-22 NOTE — Patient Instructions (Signed)
[facility] CANCER CTR AT [place]  Discharge Instructions: ?Thank you for choosing [facility] to provide your oncology and hematology care.  ?If you have a lab appointment with the [facility], please go directly to the [facility] [facility] and check in at the registration area. ? ?Wear comfortable clothing and clothing appropriate for easy access to any Portacath or PICC line.  ? ?We strive to give you quality time with your provider. You may need to reschedule your appointment if you arrive late (15 or more minutes).  Arriving late affects you and other patients whose appointments are after yours.  Also, if you miss three or more appointments without notifying the office, you may be dismissed from the clinic at the provider?s discretion.    ?  ?For prescription refill requests, have your pharmacy contact our office and allow 72 hours for refills to be completed.   ? ?Today you received the following chemotherapy and/or immunotherapy agents TAXOL ?    ?  ?To help prevent nausea and vomiting after your treatment, we encourage you to take your nausea medication as directed. ? ?BELOW ARE SYMPTOMS THAT SHOULD BE REPORTED IMMEDIATELY: ?*FEVER GREATER THAN 100.4 F (38 ?C) OR HIGHER ?*CHILLS OR SWEATING ?*NAUSEA AND VOMITING THAT IS NOT CONTROLLED WITH YOUR NAUSEA MEDICATION ?*UNUSUAL SHORTNESS OF BREATH ?*UNUSUAL BRUISING OR BLEEDING ?*URINARY PROBLEMS (pain or burning when urinating, or frequent urination) ?*BOWEL PROBLEMS (unusual diarrhea, constipation, pain near the anus) ?TENDERNESS IN MOUTH AND THROAT WITH OR WITHOUT PRESENCE OF ULCERS (sore throat, sores in mouth, or a toothache) ?UNUSUAL RASH, SWELLING OR PAIN  ?UNUSUAL VAGINAL DISCHARGE OR ITCHING  ? ?Items with * indicate a potential emergency and should be followed up as soon as possible or go to the Emergency Department if any problems should occur. ? ?Please show the CHEMOTHERAPY ALERT CARD or IMMUNOTHERAPY ALERT CARD at check-in to the  Emergency Department and triage nurse. ? ?Should you have questions after your visit or need to cancel or reschedule your appointment, please contact [facility] CANCER [place] AT [place]  [phone] and follow the prompts.  Office hours are 8:00 a.m. to 4:30 p.m. Monday - Friday. Please note that voicemails left after 4:00 p.m. may not be returned until the following business day.  We are closed weekends and major holidays. You have access to a nurse at all times for urgent questions. Please call the main number to the clinic [phone] and follow the prompts. ? ?For any non-urgent questions, you may also contact your provider using MyChart. We now offer e-Visits for anyone 80 and older to request care online for non-urgent symptoms. For details visit [email or web address] ?  ?Also download the MyChart app! Go to the app store, search "MyChart", open the app, select [facility], and log in with your MyChart username and password. ? ?Due to Covid, a mask is required upon entering the hospital/clinic. If you do not have a mask, one will be given to you upon arrival. For doctor visits, patients may have 1 support person aged 75 or older with them. For treatment visits, patients cannot have anyone with them due to current Covid guidelines and our immunocompromised population.  ? ?Paclitaxel injection ?What is this medication? ?PACLITAXEL (PAK li TAX el) is a chemotherapy drug. It targets fast dividing cells, like cancer cells, and causes these cells to die. This medicine is used to treat ovarian cancer, breast cancer, lung cancer, Kaposi's sarcoma, and other cancers. ?This medicine may be used for other purposes;  ask your health care provider or pharmacist if you have questions. ?COMMON BRAND NAME(S): Onxol, Taxol ?What should I tell my care team before I take this medication? ?They need to know if you have any of these conditions: ?history of irregular heartbeat ?liver disease ?low blood counts, like low  white cell, platelet, or red cell counts ?lung or breathing disease, like asthma ?tingling of the fingers or toes, or other nerve disorder ?an unusual or allergic reaction to paclitaxel, alcohol, polyoxyethylated castor oil, other chemotherapy, other medicines, foods, dyes, or preservatives ?pregnant or trying to get pregnant ?breast-feeding ?How should I use this medication? ?This drug is given as an infusion into a vein. It is administered in a hospital or clinic by a specially trained health care professional. ?Talk to your pediatrician regarding the use of this medicine in children. Special care may be needed. ?Overdosage: If you think you have taken too much of this medicine contact a poison control center or emergency room at once. ?NOTE: This medicine is only for you. Do not share this medicine with others. ?What if I miss a dose? ?It is important not to miss your dose. Call your doctor or health care professional if you are unable to keep an appointment. ?What may interact with this medication? ?Do not take this medicine with any of the following medications: ?live virus vaccines ?This medicine may also interact with the following medications: ?antiviral medicines for hepatitis, HIV or AIDS ?certain antibiotics like erythromycin and clarithromycin ?certain medicines for fungal infections like ketoconazole and itraconazole ?certain medicines for seizures like carbamazepine, phenobarbital, phenytoin ?gemfibrozil ?nefazodone ?rifampin ?St. John's wort ?This list may not describe all possible interactions. Give your health care provider a list of all the medicines, herbs, non-prescription drugs, or dietary supplements you use. Also tell them if you smoke, drink alcohol, or use illegal drugs. Some items may interact with your medicine. ?What should I watch for while using this medication? ?Your condition will be monitored carefully while you are receiving this medicine. You will need important blood work done  while you are taking this medicine. ?This medicine can cause serious allergic reactions. To reduce your risk you will need to take other medicine(s) before treatment with this medicine. If you experience allergic reactions like skin rash, itching or hives, swelling of the face, lips, or tongue, tell your doctor or health care professional right away. ?In some cases, you may be given additional medicines to help with side effects. Follow all directions for their use. ?This drug may make you feel generally unwell. This is not uncommon, as chemotherapy can affect healthy cells as well as cancer cells. Report any side effects. Continue your course of treatment even though you feel ill unless your doctor tells you to stop. ?Call your doctor or health care professional for advice if you get a fever, chills or sore throat, or other symptoms of a cold or flu. Do not treat yourself. This drug decreases your body's ability to fight infections. Try to avoid being around people who are sick. ?This medicine may increase your risk to bruise or bleed. Call your doctor or health care professional if you notice any unusual bleeding. ?Be careful brushing and flossing your teeth or using a toothpick because you may get an infection or bleed more easily. If you have any dental work done, tell your dentist you are receiving this medicine. ?Avoid taking products that contain aspirin, acetaminophen, ibuprofen, naproxen, or ketoprofen unless instructed by your doctor. These medicines may hide  a fever. ?Do not become pregnant while taking this medicine. Women should inform their doctor if they wish to become pregnant or think they might be pregnant. There is a potential for serious side effects to an unborn child. Talk to your health care professional or pharmacist for more information. Do not breast-feed an infant while taking this medicine. ?Men are advised not to father a child while receiving this medicine. ?This product may contain  alcohol. Ask your pharmacist or healthcare provider if this medicine contains alcohol. Be sure to tell all healthcare providers you are taking this medicine. Certain medicines, like metronidazole and disu

## 2021-08-22 NOTE — Progress Notes (Signed)
? ?[facility]pt[facility] ?[place] at [facility] ?[phone] [phone] ? ?Patient Care Team: ?Derinda Late, MD as PCP - General (Family Medicine) ?Sindy Guadeloupe, MD as Consulting Physician (Hematology and Oncology)  ? ?Name of the patient: Emma Cuevas  ?[id number]  ?1966/05/03  ? ?Date of visit: 08/22/21 ? ?Reason for Consult: ?Emma Cuevas is a 56 y.o. 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  ? ?Patient was an add-on to [facility] due to an pruritic rash that patient noticed on her leg this morning. ? ?Patient seen in infusion.  She endorses a solitary red bump that she noticed on her leg today that is itchy.  She denies scratching it.  She denies pain.  She denies other rashes.  She denies fever or chills.  She has no other symptomatic complaints. ? ?PAST MEDICAL HISTORY: ?Past Medical History:  ?Diagnosis Date  ? Anemia   ? Benign neoplasm of breast 2013  ? left breast  ? BRCA negative 2015  ? Breast cancer ([place])   ? Cancer [facility])   ? Family history of bladder cancer   ? Family history of breast cancer   ? Family history of malignant neoplasm of breast 2013  ? Family history of pancreatic cancer   ? Screening for obesity   ? ? ?PAST SURGICAL HISTORY:  ?Past Surgical History:  ?Procedure Laterality Date  ? ABDOMINAL HYSTERECTOMY  2011  ? partial  ? BREAST BIOPSY Left 2013  ? BREAST BIOPSY Right 03/14/2021  ? Stereo bx-anterior calcs, "coil" clip-path pending  ? BREAST BIOPSY Right 03/14/2021  ? stereo bx-calcs, "Ribbon" clip-path pending  ? breast biopsy Right 03/14/2021  ? [place] Bx, Axilla, path pending  ? BREAST BIOPSY Left 04/30/2021  ? BREAST SURGERY Left 10/16/2011  ? left breast finesse biopsy  ? COLONOSCOPY WITH PROPOFOL N/A 12/05/2014  ? Procedure: COLONOSCOPY WITH PROPOFOL;  Surgeon: Christene Lye, MD;  Location: [facility] ENDOSCOPY;  Service:  Endoscopy;  Laterality: N/A;  ? DILATION AND CURETTAGE OF UTERUS    ? IR CV LINE INJECTION  05/06/2021  ? IR IMAGING GUIDED PORT INSERTION  05/08/2021  ? PORTACATH PLACEMENT N/A 04/02/2021  ? Procedure: INSERTION PORT-A-CATH;  Surgeon: Herbert Pun, MD;  Location: [facility] ORS;  Service: General;  Laterality: N/A;  ? ? ?HEMATOLOGY/ONCOLOGY HISTORY:  ?Oncology History  ?Carcinoma of breast, estrogen and progesterone receptor positive ([place])  ?03/25/2021 Initial Diagnosis  ? Breast cancer, stage 1, estrogen receptor positive, right ([place]) ?  ?03/25/2021 Cancer Staging  ? Staging form: Breast, AJCC 8th Edition ?- Clinical stage from 03/25/2021: Stage IIIA (cT3, cN1, cM0, G3, ER+, PR+, HER2-) - Signed by Sindy Guadeloupe, MD on 03/25/2021 ?Histologic grading system: 3 grade system ? ?  ?04/07/2021 -  Chemotherapy  ? Patient is on Treatment Plan : BREAST NEOADJUVANT DOSE DENSE AC q14d / PACLitaxel q7d  ?   ? Genetic Testing  ? Negative genetic testing. No pathogenic variants identified on the [facility] CancerNext-Expanded+RNA panel. The report date is 05/05/2021. ? ?The CancerNext-Expanded + RNAinsight gene panel offered by [employer] and includes sequencing and rearrangement analysis for the following 77 genes: IP, ALK, APC*, ATM*, AXIN2, BAP1, BARD1, BLM, BMPR1A, BRCA1*, BRCA2*, BRIP1*, CDC73, CDH1*,CDK4, CDKN1B, CDKN2A, CHEK2*, CTNNA1, DICER1, FANCC, FH, FLCN, GALNT12, KIF1B, LZTR1, MAX, MEN1, MET, MLH1*, MSH2*, MSH3, MSH6*, MUTYH*, NBN, NF1*, NF2, NTHL1, PALB2*, PHOX2B, PMS2*, POT1, PRKAR1A, PTCH1, PTEN*, RAD51C*,  RAD51D*,RB1, RECQL, RET, SDHA, SDHAF2, SDHB, SDHC, SDHD, SMAD4, SMARCA4, SMARCB1, SMARCE1, STK11, SUFU, TMEM127, TP53*,TSC1, TSC2, VHL and XRCC2 (sequencing and deletion/duplication); EGFR, EGLN1, HOXB13, KIT, MITF, PDGFRA, POLD1 and POLE (sequencing only); EPCAM and GREM1 (deletion/duplication only). ?  ? ? ?ALLERGIES:  is allergic to tape. ? ?MEDICATIONS:  ?Current Outpatient Medications  ?Medication Sig  Dispense Refill  ? acetaminophen (TYLENOL) 500 MG tablet Take 1,000 mg by mouth every 6 (six) hours as needed for moderate pain.    ? Calcium Carb-Cholecalciferol 500-10 MG-MCG TABS Take 1 tablet by mouth every morning.    ? lidocaine-prilocaine (EMLA) cream Apply to affected area once 30 g 3  ? LORazepam (ATIVAN) 0.5 MG tablet Take 1 tablet (0.5 mg total) by mouth every 6 (six) hours as needed (Nausea or vomiting). (Patient not taking: Reported on 08/01/2021) 30 tablet 0  ? ondansetron (ZOFRAN) 8 MG tablet Take 1 tablet (8 mg total) by mouth 2 (two) times daily as needed. Start on the third day after chemotherapy. (Patient not taking: Reported on 08/01/2021) 30 tablet 1  ? prochlorperazine (COMPAZINE) 10 MG tablet Take 1 tablet (10 mg total) by mouth every 6 (six) hours as needed (Nausea or vomiting). (Patient not taking: Reported on 08/01/2021) 30 tablet 1  ? triamcinolone ointment (KENALOG) 0.5 % Apply 1 application topically 2 (two) times daily. OVER THE PORT SITE FOR REDNESS (Patient not taking: Reported on 07/18/2021) 30 g 0  ? ZIEXTENZO 6 MG/0.6ML injection INJECT THE CONTENTS OF ONE SYRINGE (6 MG) UNDER THE SKIN 24 TO 72 HOURS AFTER CHEMO EVERY 14 DAYS (Patient not taking: Reported on 08/01/2021) 0.6 mL 1  ? ?No current facility-administered medications for this visit.  ? ?Facility-Administered Medications Ordered in Other Visits  ?Medication Dose Route Frequency Provider Last Rate Last Admin  ? sodium chloride flush (NS) 0.9 % injection 10 mL  10 mL Intracatheter PRN Sindy Guadeloupe, MD      ? ? ?VITAL SIGNS: ?There were no vitals taken for this visit. ?There were no vitals filed for this visit.  ?Estimated body mass index is 30.96 kg/m? as calculated from the following: ?  Height as of an earlier encounter on 08/22/21: 5' 5" (1.651 m). ?  Weight as of an earlier encounter on 08/22/21: 186 lb 1.1 oz (84.4 kg). ? ?LABS: ?CBC: ?   ?Component Value Date/Time  ? WBC 3.6 (L) 08/22/2021 0805  ? HGB 9.4 (L) 08/22/2021  0805  ? HCT 30.2 (L) 08/22/2021 0805  ? PLT 243 08/22/2021 0805  ? MCV 100.3 (H) 08/22/2021 0805  ? NEUTROABS 2.2 08/22/2021 0805  ? LYMPHSABS 0.9 08/22/2021 0805  ? MONOABS 0.4 08/22/2021 0805  ? EOSABS 0.2 08/22/2021 0805  ? BASOSABS 0.0 08/22/2021 0805  ? ?Comprehensive Metabolic Panel: ?   ?Component Value Date/Time  ? NA 135 08/22/2021 0805  ? K 3.6 08/22/2021 0805  ? CL 107 08/22/2021 0805  ? CO2 23 08/22/2021 0805  ? BUN 13 08/22/2021 0805  ? CREATININE 0.75 08/22/2021 0805  ? GLUCOSE 118 (H) 08/22/2021 0805  ? CALCIUM 8.5 (L) 08/22/2021 0805  ? AST 25 08/22/2021 0805  ? ALT 15 08/22/2021 0805  ? ALKPHOS 48 08/22/2021 0805  ? BILITOT 0.3 08/22/2021 0805  ? PROT 5.8 (L) 08/22/2021 0805  ? ALBUMIN 3.4 (L) 08/22/2021 0805  ? ? ?RADIOGRAPHIC STUDIES: ?No results found. ? ?PERFORMANCE STATUS (ECOG) : 1 - Symptomatic but completely ambulatory ? ?Review of Systems ?Unless otherwise noted, a complete review of systems  is negative. ? ?Physical Exam ?General: NAD ?Pulmonary: Unlabored ?Extremities: no edema, no joint deformities ?Skin: Solitary raised erythematous circular to left thigh ?Neurological: Nonfocal ? ?Assessment and Plan- Patient is a 56 y.o. 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  ? ?Patient seen in infusion while receiving last cycle of weekly Taxol ?  ?Rash -unclear etiology but does not appear related to chemotherapy or medication reaction.  Patient did receive steroids as premedication regimen.  It appears to be fluid-filled but difficult to tell if it is vesicular.  There is surrounding erythema.  It is a solitary and nonpainful lesion, so doubt shingles.  Discussed management including watchful monitoring.  Patient can use antihistamines/hydrocortisone and cool compresses for pruritus.  As we are progressing into the weekend, will send a prescription for an antibiotic for use if needed.   Patient to call or RTC if rash is worsening. ? ? ?Patient expressed understanding and was in agreement with this plan. She also understands that She can call clinic at any time with any questions, concerns,

## 2021-08-25 ENCOUNTER — Other Ambulatory Visit: Payer: Self-pay | Admitting: General Surgery

## 2021-08-25 ENCOUNTER — Encounter: Payer: Self-pay | Admitting: Plastic Surgery

## 2021-08-25 DIAGNOSIS — C50411 Malignant neoplasm of upper-outer quadrant of right female breast: Secondary | ICD-10-CM

## 2021-08-26 ENCOUNTER — Encounter: Payer: Self-pay | Admitting: Oncology

## 2021-08-26 NOTE — Telephone Encounter (Signed)
5/5 with me sounds good

## 2021-09-01 NOTE — Progress Notes (Signed)
? ?  Patient ID: Emma Cuevas, female    DOB: January 31, 1966, 56 y.o.   MRN: [id number] ? ?Chief Complaint  ?Patient presents with  ? Pre-op Exam  ? ? ?  ICD-10-CM   ?1. Breast cancer, stage 1, estrogen receptor positive, right ([place])  C50.911   ? Z17.0   ?  ? ? ? ?History of Present Illness: ?Emma Cuevas is a 56 y.o. pleasant female  with a history of right-sided breast cancer.  She presents for preoperative evaluation for upcoming procedure, right-sided total mastectomy with immediate reconstruction using tissue expander and Flex HD, scheduled for 09/16/2021 with Dr. [name] and Dr. [name]. ? ?The patient has not had problems with anesthesia.  Patient is accompanied by her husband at bedside she denies any significant past medical history prior to her breast cancer diagnosis.  She reports that she currently has a left-sided port, hoping that this is removed by her general surgeon at time of surgery.  She tells me that she will require postoperative radiation for positive lymph nodes.  We will need to coordinate with oncologist and RT about how soon that needs to be initiated.  Suspect that she will likely have to delay her implant exchange until after she has completed radiation, but hopefully we can have her expanded.  She tells me that she needs to avoid any clear adhesive tapes.  She has previously suffered significant blistering and redness from surgical tapes.  Patient believes that Steri-Strips would be okay.  Denies any personal or family history of blood clots or clotting disorder.  She is currently a 34 D cup, hopes to remain about the same.  She states that her right breast is much larger than her left breast, would like to be the size of her left breast after surgery. ? ?Summary of Previous Visit: Patient was seen for consult here in clinic 07/16/2021.  At that time, she expressed interest in implant-based reconstruction.  Her mastectomy will occur after she completes neoadjuvant chemotherapy.  Base  width is 12.5 cm.  Discussed right-sided implant-based reconstruction and patient was understanding and agreeable.  She understands the need for drains. ? 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  She tells me that general surgery is already completed FMLA x2 weeks, but discussed how she may need that to be extended to 4 weeks.  She is understandably hesitant about returning to full-time work after 2 weeks. ? ?PMH Significant for: Stage IIIa invasive mammary carcinoma right breast s/p neoadjuvant AC chemotherapy, chemo-induced anemia stable around 9. ? ? ?Past Medical History: ?Allergies: ?Allergies  ?Allergen Reactions  ? Tape   ?  Redness and burning  ? ? ?Current Medications: ? ?Current Outpatient Medications:  ?  acetaminophen (TYLENOL) 500 MG tablet, Take 1,000 mg by mouth every 6 (six) hours as needed for moderate pain., Disp: , Rfl:  ?  Calcium Carb-Cholecalciferol 500-10 MG-MCG TABS, Take 1 tablet by mouth every morning., Disp: , Rfl:  ? ?Past Medical Problems: ?Past Medical History:  ?Diagnosis Date  ? Anemia   ? Benign neoplasm of breast 2013  ? left breast  ? BRCA negative 2015  ? Breast cancer ([place])   ? Cancer [facility])   ? Family history of bladder cancer   ? Family history of breast cancer   ? Family history of malignant neoplasm of breast 2013  ? Family history of pancreatic cancer   ? Screening for obesity   ? ? ?Past Surgical History: ?Past Surgical  History:  ?Procedure Laterality Date  ? ABDOMINAL HYSTERECTOMY  2011  ? partial  ? BREAST BIOPSY Left 2013  ? BREAST BIOPSY Right 03/14/2021  ? Stereo bx-anterior calcs, "coil" clip-path pending  ? BREAST BIOPSY Right 03/14/2021  ? stereo bx-calcs, "Ribbon" clip-path pending  ? breast biopsy Right 03/14/2021  ? [place] Bx, Axilla, path pending  ? BREAST BIOPSY Left 04/30/2021  ? BREAST SURGERY Left 10/16/2011  ? left breast finesse biopsy  ? COLONOSCOPY WITH PROPOFOL N/A 12/05/2014  ? Procedure: COLONOSCOPY WITH PROPOFOL;  Surgeon:  Christene Lye, MD;  Location: [facility] ENDOSCOPY;  Service: Endoscopy;  Laterality: N/A;  ? DILATION AND CURETTAGE OF UTERUS    ? IR CV LINE INJECTION  05/06/2021  ? IR IMAGING GUIDED PORT INSERTION  05/08/2021  ? PORTACATH PLACEMENT N/A 04/02/2021  ? Procedure: INSERTION PORT-A-CATH;  Surgeon: Herbert Pun, MD;  Location: [facility] ORS;  Service: General;  Laterality: N/A;  ? ? ?Social History: ?Social History  ? ?Socioeconomic History  ? Marital status: Married  ?  Spouse name: Legrand Como  ? Number of children: Not on file  ? Years of education: Not on file  ? Highest education level: Not on file  ?Occupational History  ? Not on file  ?Tobacco Use  ? Smoking status: Never  ? Smokeless tobacco: Never  ?Vaping Use  ? Vaping Use: Never used  ?Substance and Sexual Activity  ? Alcohol use: No  ? Drug use: No  ? Sexual activity: Not on file  ?Other Topics Concern  ? Not on file  ?Social History Narrative  ? Not on file  ? ?Social Determinants of Health  ? ?Financial Resource Strain: Not on file  ?Food Insecurity: Not on file  ?Transportation Needs: Not on file  ?Physical Activity: Not on file  ?Stress: Not on file  ?Social Connections: Not on file  ?Intimate Partner Violence: Not on file  ? ? ?Family History: ?Family History  ?Problem Relation Age of Onset  ? Breast cancer Mother 48  ? Bladder Cancer Father 30  ? Breast cancer Paternal Aunt 70  ? Pancreatic cancer Paternal Uncle 45  ? Pancreatic cancer Maternal Grandfather 31  ? Cancer Cousin   ?     in ear  ? ? ?Review of Systems: ?ROS ?Denies any chest pain, difficulty breathing, leg swelling, or recent trauma/infections. ? ?Physical Exam: ?Vital Signs ?There were no vitals taken for this visit. ? ?Physical Exam ?Constitutional:   ?   General: Not in acute distress. ?   Appearance: Normal appearance. Not ill-appearing.  ?HENT:  ?   Head: Normocephalic and atraumatic.  ?Eyes:  ?   Pupils: Pupils are equal, round. ?Cardiovascular:  ?   Rate and Rhythm: Normal  rate. ?   Pulses: Normal pulses.  ?Pulmonary:  ?   Effort: No respiratory distress or increased work of breathing.  Speaks in full sentences. ?Abdominal:  ?   General: Abdomen is flat. No distension.   ?Musculoskeletal: Normal range of motion. No lower extremity swelling or edema. No varicosities. ?Skin: ?   General: Skin is warm and dry.  ?   Findings: No erythema or rash.  ?Neurological:  ?   Mental Status: Alert and oriented to person, place, and time.  ?Psychiatric:     ?   Mood and Affect: Mood normal.     ?   Behavior: Behavior normal.  ? ? ?Assessment/Plan: ?The patient is scheduled for right-sided mastectomy with immediate reconstruction using tissue expander and Flex  HD with Dr. [name].  Risks, benefits, and alternatives of procedure discussed, questions answered and consent obtained.   ? ?Smoking Status: Non-smoker. ?Last Mammogram: 06/30/2021; Results: BI-RADS Category 6: Known biopsy-proven malignancy.  Significant decrease in confluency/solid tumor volume since chemotherapy.  No new or progressive disease.  Upper outer right breast, 10.9 x 8.4 x 6.8 cm. ? ?Caprini Score: 8; Risk Factors include: Age, BMI greater than 25, active breast cancer, current port placement, and length of planned surgery. Recommendation for mechanical and possibly pharmacological prophylaxis.  Will discuss with surgeon.  Encourage early ambulation.  ? ?Pictures obtained: Today ? ?Post-op Rx sent to pharmacy: Oxycodone, Zofran, Bactrim. ? ?Patient was provided with the General Surgical Risk consent document and Pain Medication Agreement prior to their appointment.  They had adequate time to read through the risk consent documents and Pain Medication Agreement. We also discussed them in person together during this preop appointment. All of their questions were answered to their satisfaction.  Recommended calling if they have any further questions.  Risk consent form and Pain Medication Agreement to be scanned into patient's  chart. ? ?The risks that can be encountered with and after placement of a breast expander placement were discussed and include the following but not limited to these: ?bleeding, infection, delayed healing, anesthesia ris

## 2021-09-03 ENCOUNTER — Ambulatory Visit (INDEPENDENT_AMBULATORY_CARE_PROVIDER_SITE_OTHER): Payer: Managed Care, Other (non HMO) | Admitting: Physician Assistant

## 2021-09-03 ENCOUNTER — Encounter: Payer: Self-pay | Admitting: Physician Assistant

## 2021-09-03 DIAGNOSIS — Z170 Estrogen receptor positive status [ER+]: Secondary | ICD-10-CM

## 2021-09-03 DIAGNOSIS — C50911 Malignant neoplasm of unspecified site of right female breast: Secondary | ICD-10-CM

## 2021-09-03 MED ORDER — SULFAMETHOXAZOLE-TRIMETHOPRIM 800-160 MG PO TABS: 1.0000 | ORAL_TABLET | Freq: Two times a day (BID) | ORAL | 0 refills | Status: AC

## 2021-09-03 MED ORDER — ONDANSETRON 4 MG PO TBDP: 4.0000 mg | ORAL_TABLET | Freq: Three times a day (TID) | ORAL | 0 refills | Status: DC | PRN

## 2021-09-03 MED ORDER — OXYCODONE HCL 5 MG PO TABS: 5.0000 mg | ORAL_TABLET | Freq: Four times a day (QID) | ORAL | 0 refills | Status: AC | PRN

## 2021-09-05 ENCOUNTER — Other Ambulatory Visit: Payer: Self-pay

## 2021-09-05 ENCOUNTER — Encounter
Admission: RE | Admit: 2021-09-05 | Discharge: 2021-09-05 | Disposition: A | Discharge status: Home / Self Care | Payer: Managed Care, Other (non HMO) | Source: Ambulatory Visit | Attending: General Surgery | Admitting: General Surgery

## 2021-09-05 VITALS — Ht 65.0 in | Wt 183.0 lb

## 2021-09-05 DIAGNOSIS — Z803 Family history of malignant neoplasm of breast: Secondary | ICD-10-CM

## 2021-09-05 NOTE — Patient Instructions (Addendum)
Your procedure is scheduled on: 09/16/2021 ?Report to the Registration Desk on the 1st floor of the [place]. ?To find out your arrival time, please call [phone] between 1PM - 3PM on: 09/15/2021  ? ?REMEMBER: ?Instructions that are not followed completely may result in serious medical risk, up to and including death; or upon the discretion of your surgeon and anesthesiologist your surgery may need to be rescheduled. ? ?Do not eat food/ drink  after midnight the night before surgery.  ?No gum chewing, lozengers or hard candies. ? ?. ?One week prior to surgery: ?Stop Anti-inflammatories (NSAIDS) such as Advil, Aleve, Ibuprofen, Motrin, Naproxen, Naprosyn and Aspirin based products such as Excedrin, Goodys Powder, BC Powder. ? ?Stop all over the counter supplements one week prior to surgery like calcium carb. ? ?No Smoking including e-cigarettes for 24 hours prior to surgery.  ?No chewable tobacco products for at least 6 hours prior to surgery.  ?No nicotine patches on the day of surgery. ? ?Do not use any "recreational" drugs for at least a week prior to your surgery.  ?Please be advised that the combination of cocaine and anesthesia may have negative outcomes, up to and including death. ?If you test positive for cocaine, your surgery will be cancelled. ? ?On the morning of surgery brush your teeth with toothpaste and water, you may rinse your mouth with mouthwash if you wish. ?Do not swallow any toothpaste or mouthwash. ? ?Use CHG Soap or wipes as directed on instruction sheet-provided for You, if not please use a dial soap to wash your body on day of surgery. ? ?Do not wear jewelry, make-up, hairpins, clips or nail polish. ? ?Do not wear lotions, powders, or perfumes.  ? ?Do not shave body from the neck down 48 hours prior to surgery just in case you cut yourself which could leave a site for infection.  ?Also, freshly shaved skin may become irritated if using the CHG soap. ? ?Contact lenses, hearing aids  and dentures may not be worn into surgery. ? ?Do not bring valuables to the hospital. [facility] is not responsible for any missing/lost belongings or valuables.  ? ? ?Notify your doctor if there is any change in your medical condition (cold, fever, infection). ? ?Wear comfortable clothing (specific to your surgery type) to the hospital. ? ?After surgery, you can help prevent lung complications by doing breathing exercises.  ?Take deep breaths and cough every 1-2 hours. Your doctor may order a device called an Incentive Spirometer to help you take deep breaths. ? ?If you are being admitted to the hospital overnight, leave your suitcase in the car. ?After surgery it may be brought to your room. ? ?If you are being discharged the day of surgery, you will not be allowed to drive home. ?You will need a responsible adult (18 years or older) to drive you home and stay with you that night.  ? ?If you are taking public transportation, you will need to have a responsible adult (18 years or older) with you. ?Please confirm with your physician that it is acceptable to use public transportation.  ? ?Please call the [place] Dept. at [phone] if you have any questions about these instructions. ? ?Surgery Visitation Policy: ? ?Patients undergoing a surgery or procedure may have two family members or support persons with them as long as the person is not COVID-19 positive or experiencing its symptoms.  ? ?Inpatient Visitation:   ? ?Visiting hours are 7 a.m. to 8  p.m. ?Up to four visitors are allowed at one time in a patient room, including children. The visitors may rotate out with other people during the day. One designated support person (adult) may remain overnight.  ?

## 2021-09-08 ENCOUNTER — Encounter: Payer: Self-pay | Admitting: Oncology

## 2021-09-09 ENCOUNTER — Ambulatory Visit
Admission: RE | Admit: 2021-09-09 | Discharge: 2021-09-09 | Disposition: A | Discharge status: Home / Self Care | Payer: Managed Care, Other (non HMO) | Source: Ambulatory Visit | Attending: General Surgery | Admitting: General Surgery

## 2021-09-09 DIAGNOSIS — C50411 Malignant neoplasm of upper-outer quadrant of right female breast: Secondary | ICD-10-CM | POA: Insufficient documentation

## 2021-09-09 HISTORY — PX: BREAST BIOPSY WITH RADIO FREQUENCY LOCALIZER: SHX6895

## 2021-09-09 IMAGING — US US PLC BREAST LOC DEV 1ST LEASION INC US GUIDE*R*
1 series · 10 of 10 positions shown · non-contrast
Comparison: Previous exams.

CLINICAL DATA: Patient with history of biopsy-proven nodal disease
with invasive mammary carcinoma status post neoadjuvant
chemotherapy. Patient presents for RF ID tag localization of the
biopsied RIGHT axillary lymph node. Lymph node is now
morphologically normal in appearance. At time of biopsy, a HYDROMARK
clip displaced and was noted to be superficial to the lymph node by
at least 1.5 cm

EXAM:
RF ID LOCALIZATION OF THE RIGHT BREAST WITH ULTRASOUND GUIDANCE

[Series 1: us plc breast loc dev 1st leasion inc us guide*rig · 0.07mm/px · 10 of 10 slices shown]
[im 1/10]
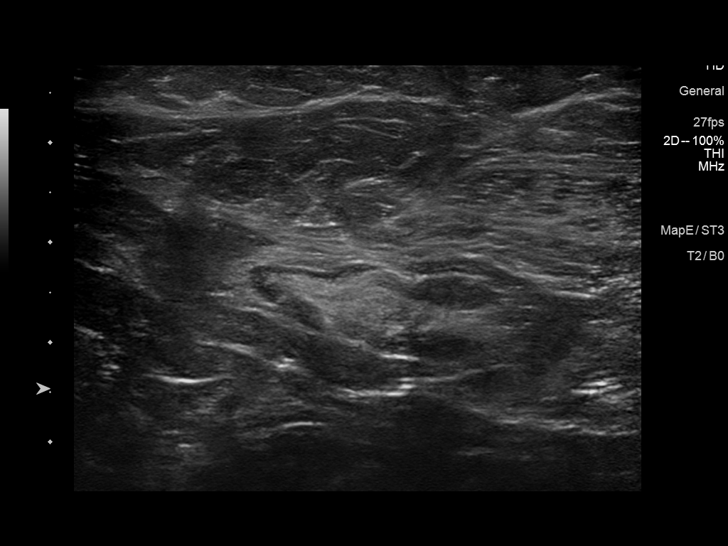
[im 2/10]
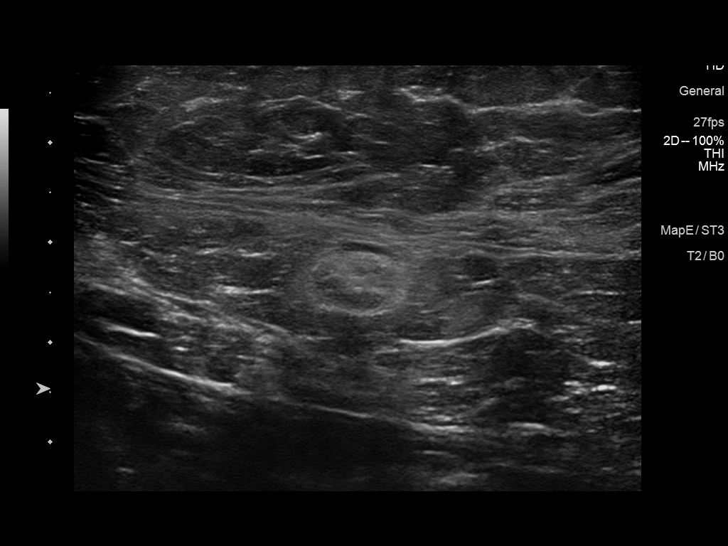
[im 3/10]
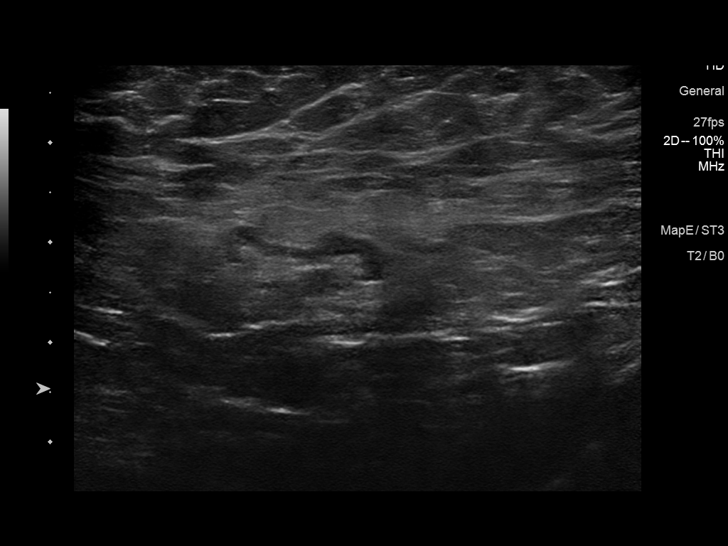
[im 4/10]
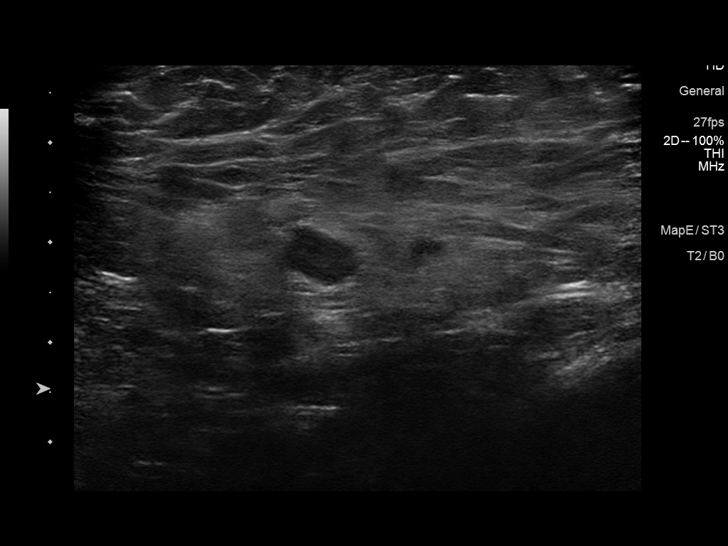
[im 5/10]
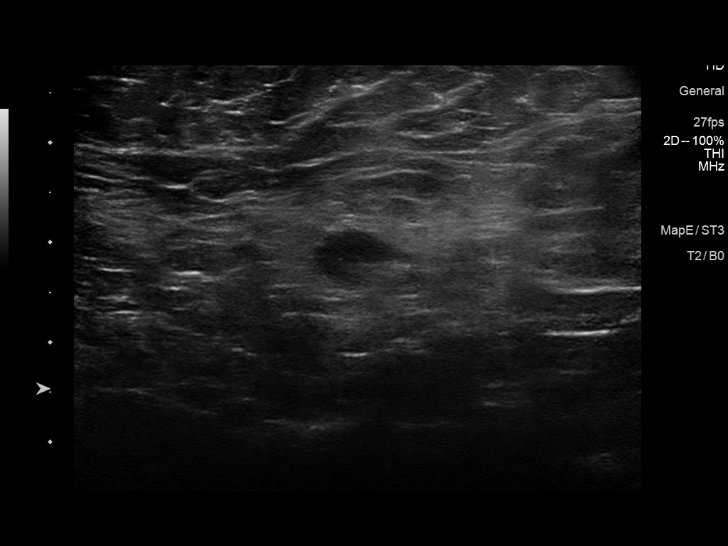
[im 6/10]
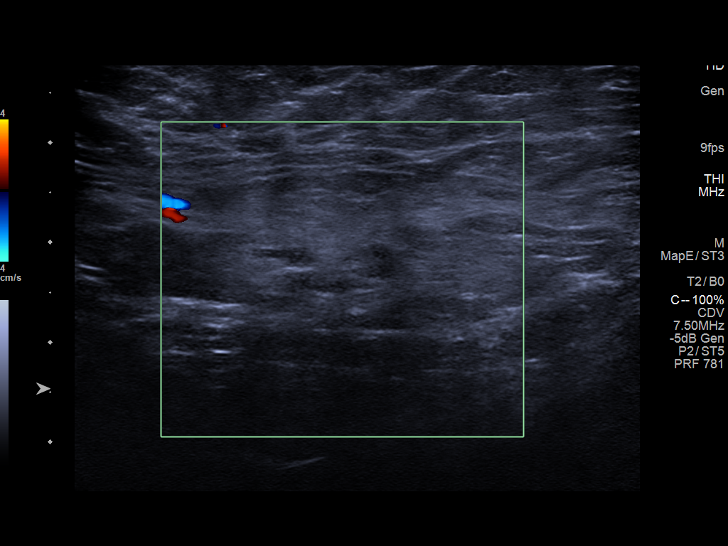
[im 7/10]
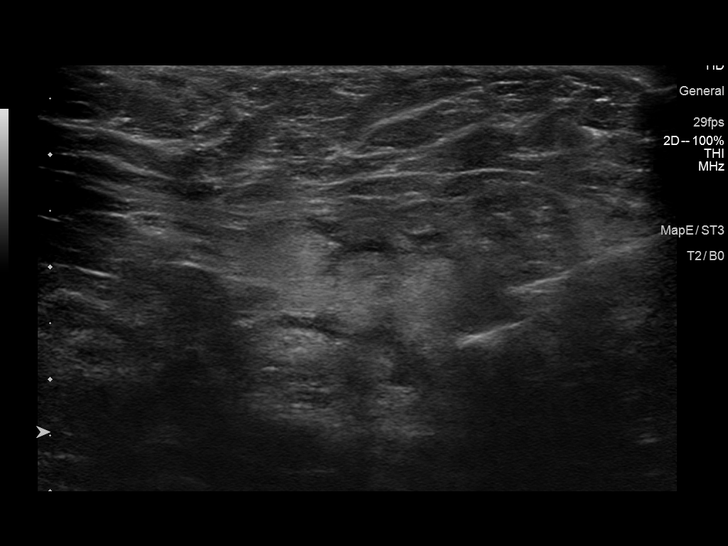
[im 8/10]
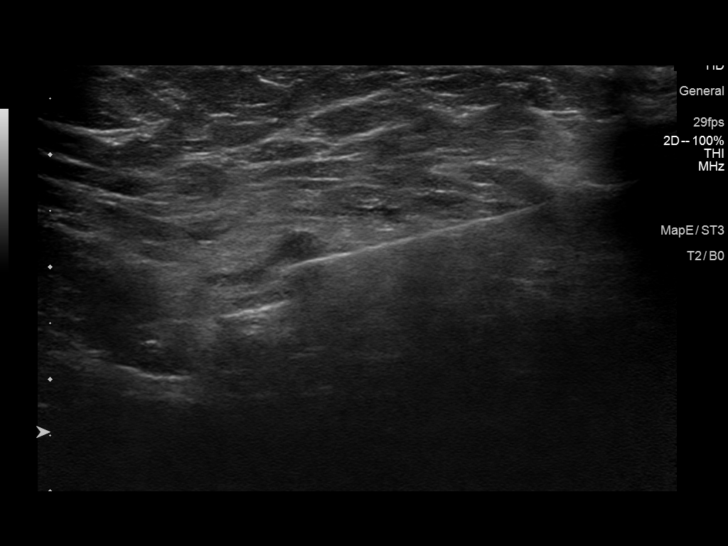
[im 9/10]
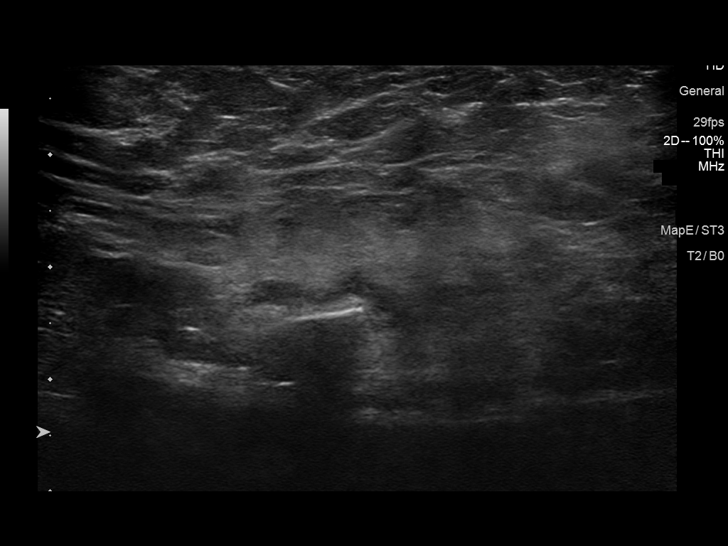
[im 10/10]
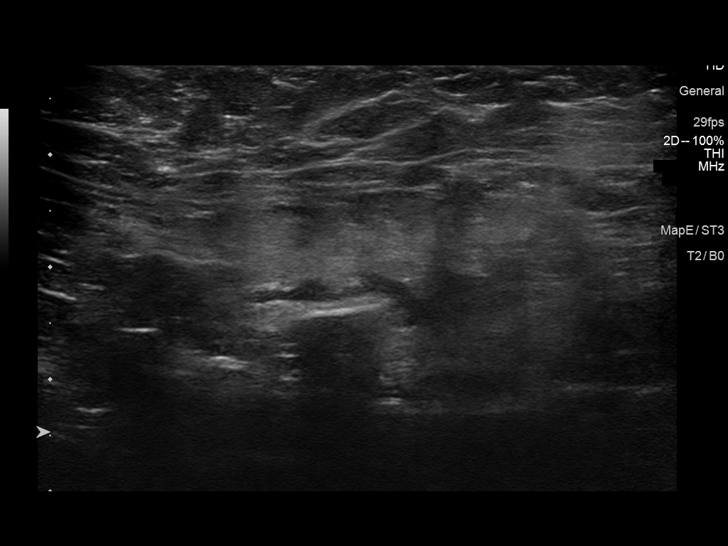

[10 of 10 positions shown; findings below may reference images not displayed]

FINDINGS: Patient presents for RF ID tag localization prior to surgery for
biopsy proven malignancy and nodal disease. I met with the patient
and we discussed the procedure of RF ID localization including
benefits and alternatives. We discussed the high likelihood of a
successful procedure. We discussed the risks of the procedure,
including infection, bleeding, tissue injury, and further surgery.
Informed, written consent was given. The usual time-out protocol was
performed immediately prior to the procedure.

Targeted ultrasound was performed of the RIGHT axilla. HYDROMARK
clip is not visualized within an axillary lymph node, consistent
with known displaced marker. In the lower aspect of the RIGHT
axilla, a reniform RIGHT axillary lymph node is noted which is
favored to reflect the previously biopsied lymph node based off of
landmarks. Using ultrasound guidance, sterile technique, 1%
lidocaine and a 7 cm RF ID tag needle (RF ID [FY]), a RIGHT
axillary lymph node was localized using an inferior approach. The
images were marked for Dr. ALIPIO.
IMPRESSION: 1. RF ID tag localization of a RIGHT axillary lymph node in the
RIGHT breast. No apparent complications.

2. Please note that the HYDROMARK clip is not expected to be present
within any removed lymph node. This was discussed with Dr. ALIPIO
ALIPIO via [REDACTED] on [DATE].

## 2021-09-09 IMAGING — MG MM DIGITAL DIAGNOSTIC UNILAT*R* W/ TOMO W/ CAD
6 series · 6 of 18 positions shown · non-contrast
Comparison: Previous exam(s).

CLINICAL DATA: Status post RIGHT axillary RF ID tag placement

EXAM:
DIAGNOSTIC RIGHT MAMMOGRAM POST ULTRASOUND-GUIDED RF ID tag
PLACEMENT

[R MLO synth-2D (1 of 3)]
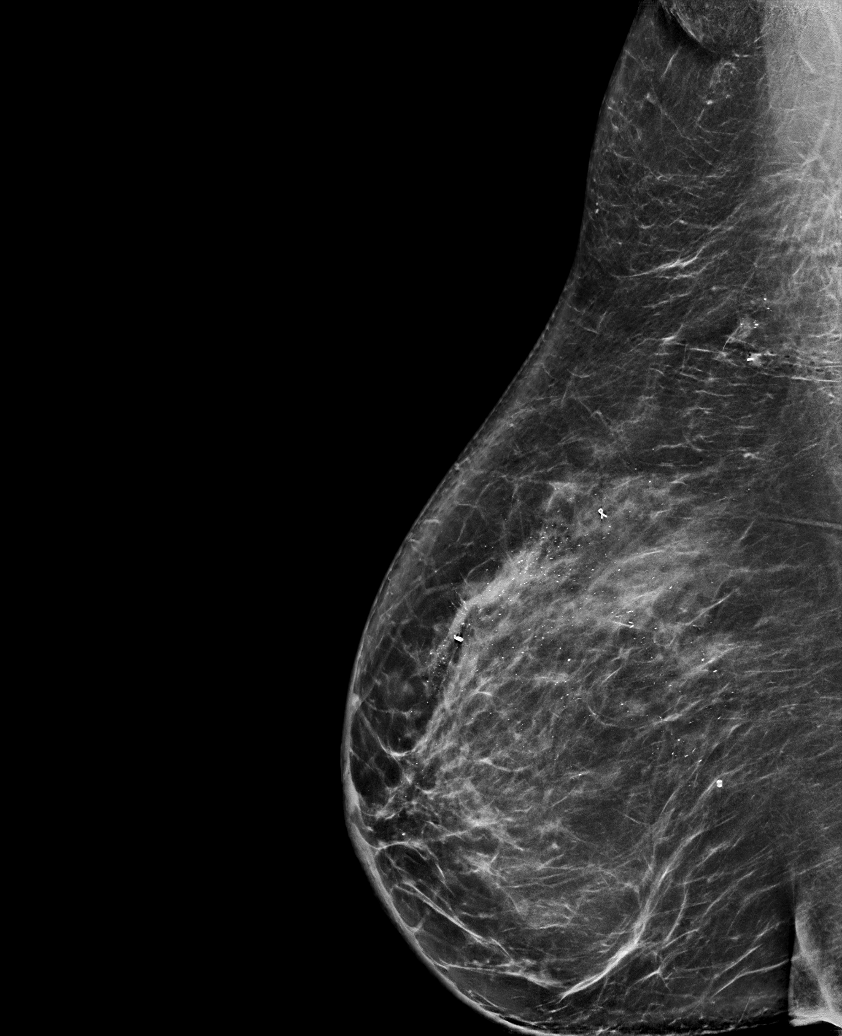

[R MLO synth-2D (2 of 3)]
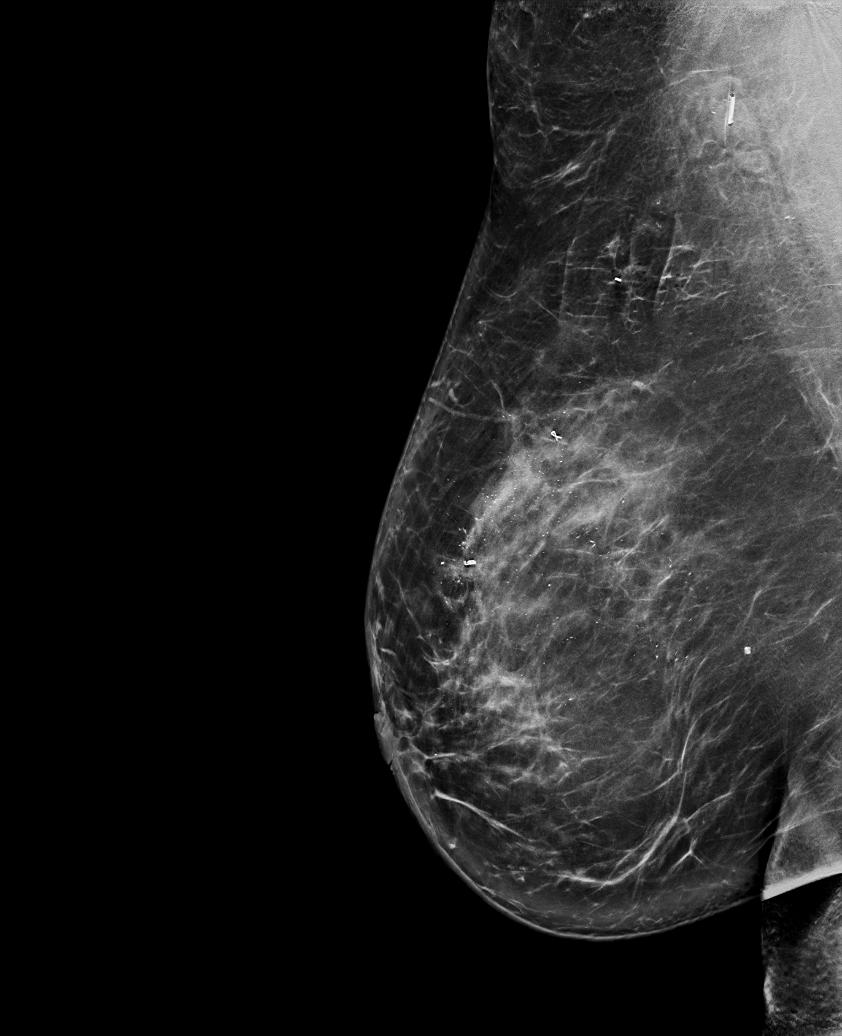

[R MLO synth-2D (3 of 3)]
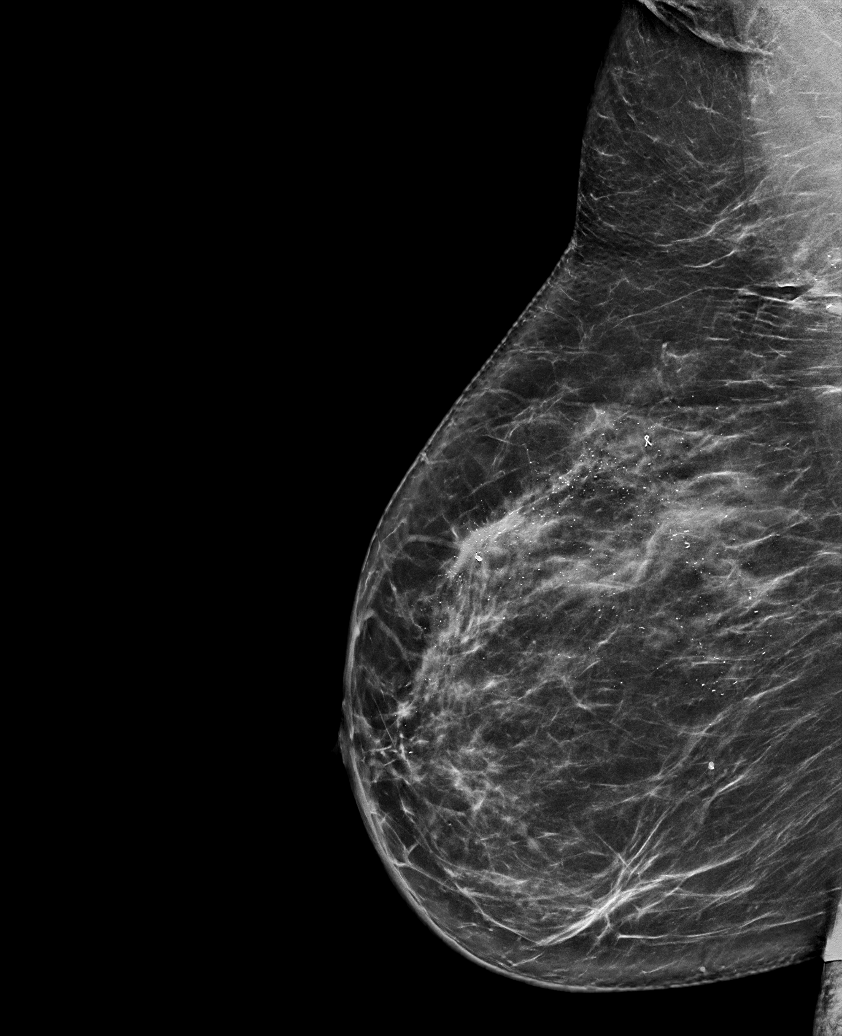

[R MLO tomo (1 of 3) · tomo slice 45/89.0]
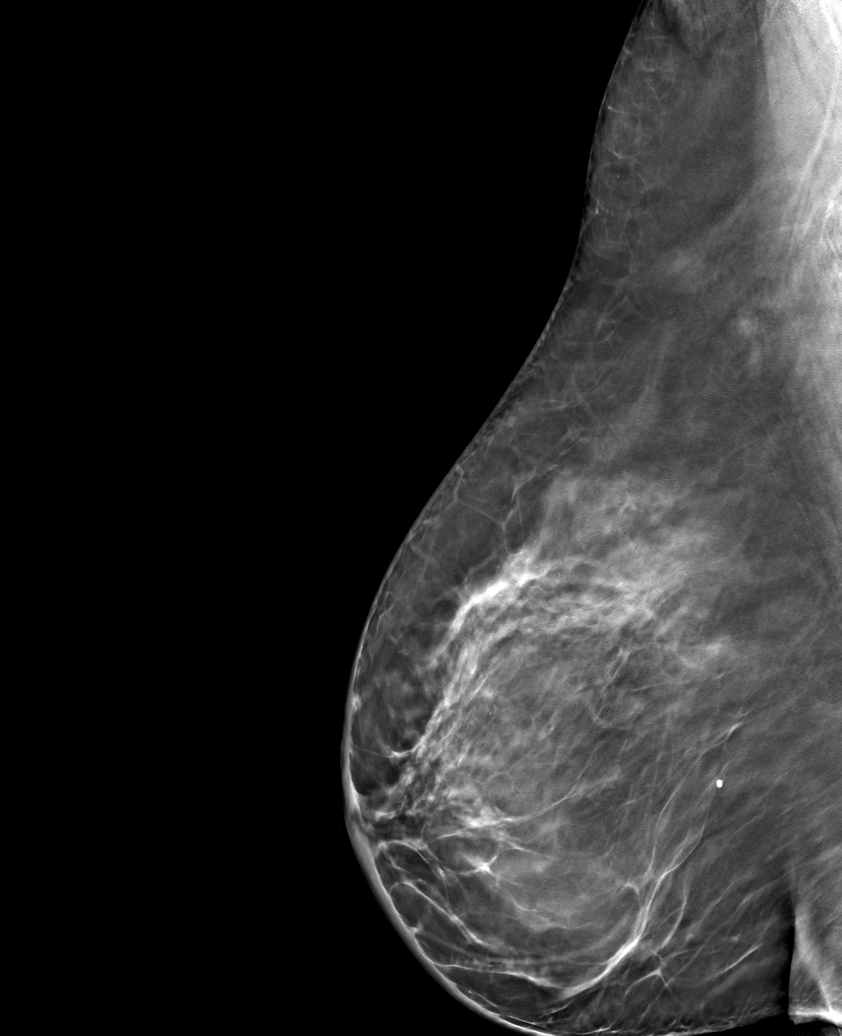

[R MLO tomo (2 of 3) · tomo slice 51/100.0]
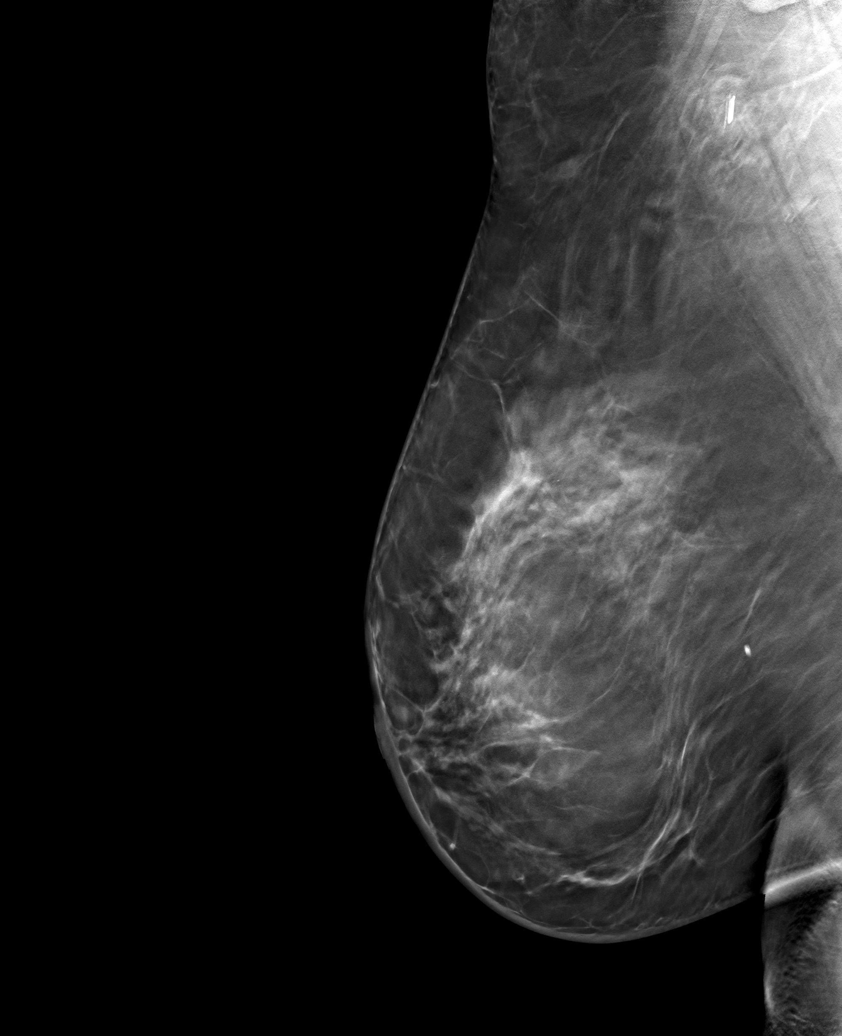

[R MLO tomo (3 of 3) · tomo slice 43/84.0]
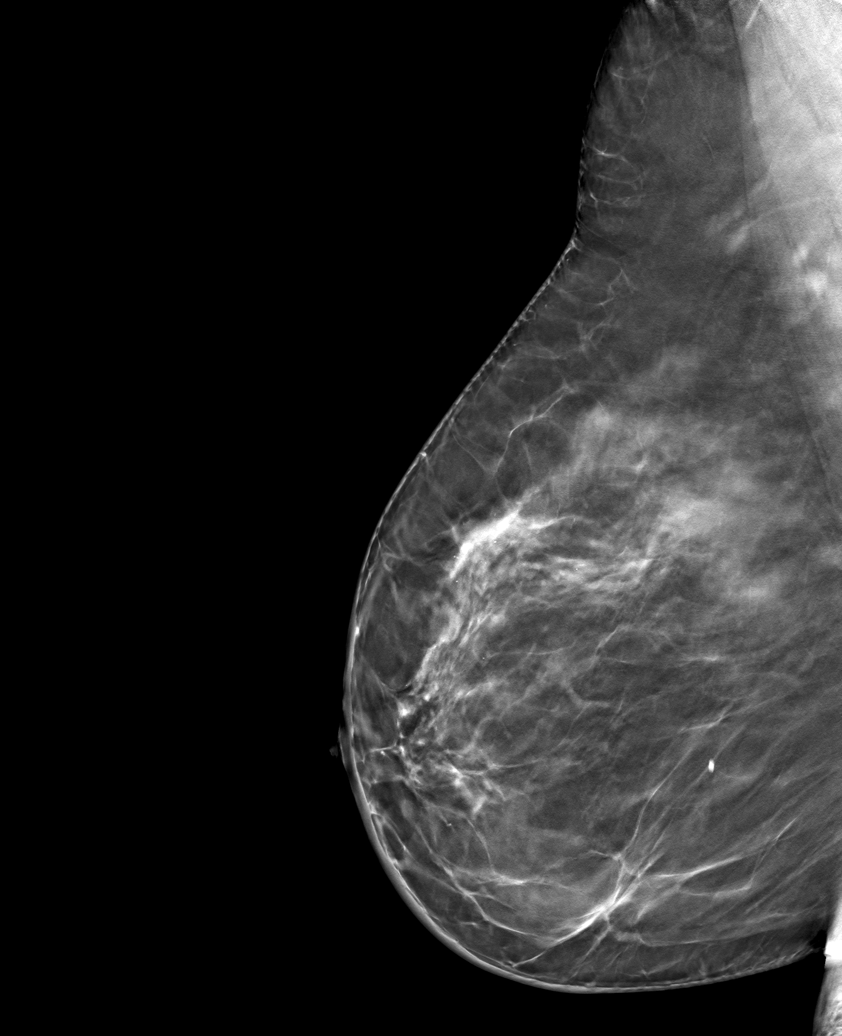

[6 of 18 positions shown; findings below may reference images not displayed]

FINDINGS: Mammographic images were obtained following ultrasound-guided RF ID
tag placement. These demonstrate the RF ID tag ([4J]) within the
previously biopsied RIGHT axillary lymph node. HYDROMARK clip is
displaced and is within the subcutaneous tissues.
IMPRESSION: Appropriate location of the RF ID tag within a previously biopsied
RIGHT axillary lymph node. Please note that the HYDROMARK clip is
displaced and is not within the lymph node.

Final Assessment: Post Procedure Mammograms for RF ID tag placement

## 2021-09-11 ENCOUNTER — Other Ambulatory Visit: Payer: Self-pay | Admitting: Nurse Practitioner

## 2021-09-12 ENCOUNTER — Other Ambulatory Visit: Payer: Self-pay | Admitting: Nurse Practitioner

## 2021-09-15 MED ORDER — FAMOTIDINE 20 MG PO TABS: 20.0000 mg | ORAL_TABLET | Freq: Once | ORAL | Status: AC

## 2021-09-15 MED ORDER — CEFAZOLIN SODIUM-DEXTROSE 2-4 GM/100ML-% IV SOLN
2.0000 g | INTRAVENOUS | Status: AC
  Administered 2021-09-16: 2 g via INTRAVENOUS

## 2021-09-15 MED ORDER — ORAL CARE MOUTH RINSE: 15.0000 mL | Freq: Once | OROMUCOSAL | Status: AC

## 2021-09-15 MED ORDER — LACTATED RINGERS IV SOLN
INTRAVENOUS | Status: DC
Start: 2021-09-15 — End: 2021-09-16

## 2021-09-15 MED ORDER — CHLORHEXIDINE GLUCONATE 0.12 % MT SOLN: 15.0000 mL | Freq: Once | OROMUCOSAL | Status: AC

## 2021-09-16 ENCOUNTER — Other Ambulatory Visit: Payer: Self-pay

## 2021-09-16 ENCOUNTER — Ambulatory Visit
Admission: RE | Admit: 2021-09-16 | Discharge: 2021-09-16 | Disposition: A | Discharge status: Home / Self Care | Payer: Managed Care, Other (non HMO) | Source: Ambulatory Visit | Attending: General Surgery | Admitting: General Surgery

## 2021-09-16 ENCOUNTER — Encounter
Admission: RE | Disposition: A | Discharge status: Home / Self Care | Payer: Self-pay | Source: Home / Self Care | Attending: General Surgery

## 2021-09-16 ENCOUNTER — Ambulatory Visit: Payer: Managed Care, Other (non HMO)

## 2021-09-16 ENCOUNTER — Encounter: Payer: Self-pay | Admitting: General Surgery

## 2021-09-16 ENCOUNTER — Ambulatory Visit: Payer: Managed Care, Other (non HMO) | Admitting: Anesthesiology

## 2021-09-16 ENCOUNTER — Observation Stay
Admission: RE | Admit: 2021-09-16 | Discharge: 2021-09-17 | Disposition: A | Discharge status: Home / Self Care | Payer: Managed Care, Other (non HMO) | Attending: General Surgery | Admitting: General Surgery

## 2021-09-16 DIAGNOSIS — C50411 Malignant neoplasm of upper-outer quadrant of right female breast: Secondary | ICD-10-CM

## 2021-09-16 DIAGNOSIS — C773 Secondary and unspecified malignant neoplasm of axilla and upper limb lymph nodes: Secondary | ICD-10-CM | POA: Diagnosis not present

## 2021-09-16 DIAGNOSIS — Z803 Family history of malignant neoplasm of breast: Secondary | ICD-10-CM

## 2021-09-16 DIAGNOSIS — C50919 Malignant neoplasm of unspecified site of unspecified female breast: Secondary | ICD-10-CM | POA: Diagnosis present

## 2021-09-16 DIAGNOSIS — C50911 Malignant neoplasm of unspecified site of right female breast: Secondary | ICD-10-CM

## 2021-09-16 DIAGNOSIS — Z170 Estrogen receptor positive status [ER+]: Secondary | ICD-10-CM

## 2021-09-16 HISTORY — PX: SIMPLE MASTECTOMY WITH AXILLARY SENTINEL NODE BIOPSY: SHX6098

## 2021-09-16 HISTORY — PX: BREAST RECONSTRUCTION WITH PLACEMENT OF TISSUE EXPANDER AND FLEX HD (ACELLULAR HYDRATED DERMIS): SHX6295

## 2021-09-16 HISTORY — PX: PORT-A-CATH REMOVAL: SHX5289

## 2021-09-16 SURGERY — SIMPLE MASTECTOMY WITH AXILLARY SENTINEL NODE BIOPSY
Anesthesia: General | Site: Breast | Laterality: Right

## 2021-09-16 MED ORDER — ACETAMINOPHEN 10 MG/ML IV SOLN
INTRAVENOUS | Status: DC | PRN
  Administered 2021-09-16: 1000 mg via INTRAVENOUS

## 2021-09-16 MED ORDER — PANTOPRAZOLE SODIUM 40 MG IV SOLR
40.0000 mg | Freq: Every day | INTRAVENOUS | Status: DC
  Administered 2021-09-16: 40 mg via INTRAVENOUS
  Filled 2021-09-16: qty 10

## 2021-09-16 MED ORDER — ONDANSETRON HCL 4 MG/2ML IJ SOLN
INTRAMUSCULAR | Status: DC | PRN
  Administered 2021-09-16: 4 mg via INTRAVENOUS

## 2021-09-16 MED ORDER — MIDAZOLAM HCL 2 MG/2ML IJ SOLN
INTRAMUSCULAR | Status: DC | PRN
  Administered 2021-09-16 (×2): 1 mg via INTRAVENOUS

## 2021-09-16 MED ORDER — CEFAZOLIN SODIUM 1 G IJ SOLR
INTRAMUSCULAR | Status: AC
Start: 2021-09-16 — End: ?
  Filled 2021-09-16: qty 10

## 2021-09-16 MED ORDER — ENOXAPARIN SODIUM 40 MG/0.4ML IJ SOSY
40.0000 mg | PREFILLED_SYRINGE | INTRAMUSCULAR | Status: DC
  Administered 2021-09-17: 40 mg via SUBCUTANEOUS
  Filled 2021-09-16: qty 0.4

## 2021-09-16 MED ORDER — SODIUM CHLORIDE 0.9 % IV SOLN: INTRAVENOUS | Status: DC | PRN

## 2021-09-16 MED ORDER — SODIUM CHLORIDE (PF) 0.9 % IJ SOLN
INTRAMUSCULAR | Status: AC
  Filled 2021-09-16: qty 50

## 2021-09-16 MED ORDER — SODIUM CHLORIDE 0.9 % IV SOLN: INTRAVENOUS | Status: DC

## 2021-09-16 MED ORDER — FENTANYL CITRATE (PF) 100 MCG/2ML IJ SOLN
INTRAMUSCULAR | Status: AC
  Administered 2021-09-16: 25 ug via INTRAVENOUS
  Filled 2021-09-16: qty 2

## 2021-09-16 MED ORDER — METHYLENE BLUE 1 % INJ SOLN
INTRAVENOUS | Status: AC
  Filled 2021-09-16: qty 10

## 2021-09-16 MED ORDER — ROCURONIUM BROMIDE 100 MG/10ML IV SOLN
INTRAVENOUS | Status: DC | PRN
  Administered 2021-09-16: 60 mg via INTRAVENOUS
  Administered 2021-09-16: 20 mg via INTRAVENOUS
  Administered 2021-09-16: 40 mg via INTRAVENOUS
  Administered 2021-09-16: 30 mg via INTRAVENOUS

## 2021-09-16 MED ORDER — HYDROCODONE-ACETAMINOPHEN 5-325 MG PO TABS
1.0000 | ORAL_TABLET | ORAL | Status: DC | PRN
  Administered 2021-09-16 – 2021-09-17 (×3): 2 via ORAL
  Filled 2021-09-16 (×3): qty 2

## 2021-09-16 MED ORDER — ROCURONIUM BROMIDE 10 MG/ML (PF) SYRINGE
PREFILLED_SYRINGE | INTRAVENOUS | Status: AC
  Filled 2021-09-16: qty 10

## 2021-09-16 MED ORDER — ONDANSETRON HCL 4 MG/2ML IJ SOLN
INTRAMUSCULAR | Status: AC
  Filled 2021-09-16: qty 2

## 2021-09-16 MED ORDER — PHENYLEPHRINE HCL (PRESSORS) 10 MG/ML IV SOLN
INTRAVENOUS | Status: DC | PRN
  Administered 2021-09-16 (×3): 80 ug via INTRAVENOUS

## 2021-09-16 MED ORDER — LIDOCAINE HCL (PF) 2 % IJ SOLN
INTRAMUSCULAR | Status: AC
  Filled 2021-09-16: qty 5

## 2021-09-16 MED ORDER — FAMOTIDINE 20 MG PO TABS
ORAL_TABLET | ORAL | Status: AC
  Administered 2021-09-16: 20 mg via ORAL
  Filled 2021-09-16: qty 1

## 2021-09-16 MED ORDER — KETAMINE HCL 50 MG/5ML IJ SOSY
PREFILLED_SYRINGE | INTRAMUSCULAR | Status: AC
  Filled 2021-09-16: qty 5

## 2021-09-16 MED ORDER — ONDANSETRON 4 MG PO TBDP: 4.0000 mg | ORAL_TABLET | Freq: Four times a day (QID) | ORAL | Status: DC | PRN

## 2021-09-16 MED ORDER — CEFAZOLIN SODIUM-DEXTROSE 2-4 GM/100ML-% IV SOLN
INTRAVENOUS | Status: AC
  Filled 2021-09-16: qty 100

## 2021-09-16 MED ORDER — PROPOFOL 10 MG/ML IV BOLUS
INTRAVENOUS | Status: DC | PRN
  Administered 2021-09-16: 130 mg via INTRAVENOUS

## 2021-09-16 MED ORDER — METHYLENE BLUE 1 % INJ SOLN
INTRAVENOUS | Status: DC | PRN
  Administered 2021-09-16: 7 mL via SUBMUCOSAL

## 2021-09-16 MED ORDER — BUPIVACAINE-EPINEPHRINE (PF) 0.25% -1:200000 IJ SOLN
INTRAMUSCULAR | Status: AC
  Filled 2021-09-16: qty 30

## 2021-09-16 MED ORDER — PROPOFOL 10 MG/ML IV BOLUS
INTRAVENOUS | Status: AC
  Filled 2021-09-16: qty 20

## 2021-09-16 MED ORDER — MORPHINE SULFATE (PF) 4 MG/ML IV SOLN: 4.0000 mg | INTRAVENOUS | Status: DC | PRN

## 2021-09-16 MED ORDER — GABAPENTIN 300 MG PO CAPS
300.0000 mg | ORAL_CAPSULE | Freq: Two times a day (BID) | ORAL | Status: DC
  Administered 2021-09-16 – 2021-09-17 (×2): 300 mg via ORAL
  Filled 2021-09-16 (×2): qty 1

## 2021-09-16 MED ORDER — BUPIVACAINE HCL (PF) 0.5 % IJ SOLN
INTRAMUSCULAR | Status: AC
  Filled 2021-09-16: qty 30

## 2021-09-16 MED ORDER — DEXAMETHASONE SODIUM PHOSPHATE 10 MG/ML IJ SOLN
INTRAMUSCULAR | Status: DC | PRN
  Administered 2021-09-16: 10 mg via INTRAVENOUS

## 2021-09-16 MED ORDER — FENTANYL CITRATE (PF) 100 MCG/2ML IJ SOLN
INTRAMUSCULAR | Status: DC | PRN
  Administered 2021-09-16 (×2): 50 ug via INTRAVENOUS

## 2021-09-16 MED ORDER — KETAMINE HCL 10 MG/ML IJ SOLN
INTRAMUSCULAR | Status: DC | PRN
  Administered 2021-09-16: 20 mg via INTRAVENOUS

## 2021-09-16 MED ORDER — BUPIVACAINE-EPINEPHRINE (PF) 0.5% -1:200000 IJ SOLN
INTRAMUSCULAR | Status: AC
  Filled 2021-09-16: qty 30

## 2021-09-16 MED ORDER — CEFAZOLIN SODIUM-DEXTROSE 2-4 GM/100ML-% IV SOLN
2.0000 g | Freq: Three times a day (TID) | INTRAVENOUS | Status: AC
  Administered 2021-09-16: 2 g via INTRAVENOUS
  Filled 2021-09-16: qty 100

## 2021-09-16 MED ORDER — ACETAMINOPHEN 10 MG/ML IV SOLN
INTRAVENOUS | Status: AC
  Filled 2021-09-16: qty 100

## 2021-09-16 MED ORDER — TECHNETIUM TC 99M TILMANOCEPT KIT
1.0000 | PACK | Freq: Once | INTRAVENOUS | Status: AC | PRN
  Administered 2021-09-16: 0.823 via INTRADERMAL

## 2021-09-16 MED ORDER — INDOCYANINE GREEN 25 MG IV SOLR
INTRAVENOUS | Status: DC | PRN
  Administered 2021-09-16: 5 mg via INTRAVENOUS

## 2021-09-16 MED ORDER — FENTANYL CITRATE (PF) 100 MCG/2ML IJ SOLN
25.0000 ug | INTRAMUSCULAR | Status: DC | PRN
  Administered 2021-09-16 (×4): 25 ug via INTRAVENOUS

## 2021-09-16 MED ORDER — CHLORHEXIDINE GLUCONATE 0.12 % MT SOLN
OROMUCOSAL | Status: AC
  Administered 2021-09-16: 15 mL via OROMUCOSAL
  Filled 2021-09-16: qty 15

## 2021-09-16 MED ORDER — ONDANSETRON HCL 4 MG/2ML IJ SOLN: 4.0000 mg | Freq: Once | INTRAMUSCULAR | Status: DC | PRN

## 2021-09-16 MED ORDER — LIDOCAINE HCL (CARDIAC) PF 100 MG/5ML IV SOSY
PREFILLED_SYRINGE | INTRAVENOUS | Status: DC | PRN
  Administered 2021-09-16: 60 mg via INTRAVENOUS

## 2021-09-16 MED ORDER — CELECOXIB 200 MG PO CAPS
200.0000 mg | ORAL_CAPSULE | Freq: Two times a day (BID) | ORAL | Status: DC
  Administered 2021-09-16 – 2021-09-17 (×2): 200 mg via ORAL
  Filled 2021-09-16 (×2): qty 1

## 2021-09-16 MED ORDER — GENTAMICIN SULFATE 40 MG/ML IJ SOLN
INTRAMUSCULAR | Status: AC
  Filled 2021-09-16: qty 2

## 2021-09-16 MED ORDER — DEXAMETHASONE SODIUM PHOSPHATE 10 MG/ML IJ SOLN
INTRAMUSCULAR | Status: AC
  Filled 2021-09-16: qty 1

## 2021-09-16 MED ORDER — ONDANSETRON HCL 4 MG/2ML IJ SOLN: 4.0000 mg | Freq: Four times a day (QID) | INTRAMUSCULAR | Status: DC | PRN

## 2021-09-16 MED ORDER — FENTANYL CITRATE (PF) 100 MCG/2ML IJ SOLN
INTRAMUSCULAR | Status: AC
  Filled 2021-09-16: qty 2

## 2021-09-16 MED ORDER — BUPIVACAINE LIPOSOME 1.3 % IJ SUSP
INTRAMUSCULAR | Status: AC
  Filled 2021-09-16: qty 20

## 2021-09-16 MED ORDER — MIDAZOLAM HCL 2 MG/2ML IJ SOLN
INTRAMUSCULAR | Status: AC
Start: 2021-09-16 — End: ?
  Filled 2021-09-16: qty 2

## 2021-09-16 MED ORDER — BUPIVACAINE HCL 0.5 % IJ SOLN
INTRAMUSCULAR | Status: DC | PRN
  Administered 2021-09-16: 50 mL

## 2021-09-16 MED ORDER — SUGAMMADEX SODIUM 200 MG/2ML IV SOLN
INTRAVENOUS | Status: DC | PRN
  Administered 2021-09-16: 200 mg via INTRAVENOUS

## 2021-09-16 MED ORDER — PROPOFOL 500 MG/50ML IV EMUL
INTRAVENOUS | Status: DC | PRN
  Administered 2021-09-16: 25 ug/kg/min via INTRAVENOUS

## 2021-09-16 SURGICAL SUPPLY — 88 items
ADH SKN CLS APL DERMABOND .7 (GAUZE/BANDAGES/DRESSINGS) ×3
APL PRP STRL LF DISP 70% ISPRP (MISCELLANEOUS)
BINDER BREAST XLRG (GAUZE/BANDAGES/DRESSINGS) ×1 IMPLANT
BIOPATCH WHT 1IN DISK W/4.0 H (GAUZE/BANDAGES/DRESSINGS) IMPLANT
BLADE BOVIE TIP EXT 4 (BLADE) ×5 IMPLANT
BLADE SURG 10 STRL SS SAFETY (BLADE) ×5 IMPLANT
BLADE SURG 15 STRL LF DISP TIS (BLADE) ×4 IMPLANT
BLADE SURG 15 STRL SS (BLADE) ×4
BNDG CMPR STD VLCR NS LF 5.8X6 (GAUZE/BANDAGES/DRESSINGS)
BNDG ELASTIC 6X5.8 VLCR NS LF (GAUZE/BANDAGES/DRESSINGS) ×8 IMPLANT
BULB RESERV EVAC DRAIN JP 100C (MISCELLANEOUS) ×10 IMPLANT
CHLORAPREP W/TINT 26 (MISCELLANEOUS) ×12 IMPLANT
DERMABOND ADVANCED (GAUZE/BANDAGES/DRESSINGS) ×1
DERMABOND ADVANCED .7 DNX12 (GAUZE/BANDAGES/DRESSINGS) ×12 IMPLANT
DRAIN CHANNEL JP 15F RND 16 (MISCELLANEOUS) ×10 IMPLANT
DRAPE 3/4 80X56 (DRAPES) ×8 IMPLANT
DRAPE CHEST BREAST 77X106 FENE (MISCELLANEOUS) IMPLANT
DRAPE LAPAROTOMY TRNSV 106X77 (MISCELLANEOUS) ×5 IMPLANT
DRAPE UTILITY 15X26 TOWEL STRL (DRAPES) ×10 IMPLANT
DRSG GAUZE FLUFF 36X18 (GAUZE/BANDAGES/DRESSINGS) ×4 IMPLANT
ELECT CAUTERY BLADE TIP 2.5 (TIP) ×4
ELECT REM PT RETURN 9FT ADLT (ELECTROSURGICAL) ×8
ELECTRODE CAUTERY BLDE TIP 2.5 (TIP) ×4 IMPLANT
ELECTRODE REM PT RTRN 9FT ADLT (ELECTROSURGICAL) ×8 IMPLANT
GAUZE 4X4 16PLY ~~LOC~~+RFID DBL (SPONGE) ×10 IMPLANT
GAUZE SPONGE 4X4 12PLY STRL (GAUZE/BANDAGES/DRESSINGS) ×10 IMPLANT
GLOVE BIO SURGEON STRL SZ 6.5 (GLOVE) ×5 IMPLANT
GLOVE BIOGEL PI IND STRL 6.5 (GLOVE) ×4 IMPLANT
GLOVE BIOGEL PI IND STRL 8 (GLOVE) ×4 IMPLANT
GLOVE BIOGEL PI INDICATOR 6.5 (GLOVE) ×1
GLOVE BIOGEL PI INDICATOR 8 (GLOVE) ×1
GLOVE SURG ENC TEXT LTX SZ7.5 (GLOVE) ×10 IMPLANT
GOWN STRL REUS W/ TWL LRG LVL3 (GOWN DISPOSABLE) ×28 IMPLANT
GOWN STRL REUS W/TWL LRG LVL3 (GOWN DISPOSABLE) ×28
GRAFT FLEX HD 19X22X0.7-1.4 (Tissue) ×1 IMPLANT
IMPL EXPANDER BREAST 475CC (Breast) IMPLANT
IMPLANT BREAST 475CC (Breast) ×1 IMPLANT
IMPLANT EXPANDER BREAST 475CC (Breast) ×3 IMPLANT
IV NS 1000ML (IV SOLUTION) ×4
IV NS 1000ML BAXH (IV SOLUTION) IMPLANT
IV NS 500ML (IV SOLUTION)
IV NS 500ML BAXH (IV SOLUTION) IMPLANT
KIT FILL ASEPTIC TRANSFER (MISCELLANEOUS) ×4 IMPLANT
KIT TURNOVER KIT A (KITS) ×9 IMPLANT
LABEL OR SOLS (LABEL) ×5 IMPLANT
MANIFOLD NEPTUNE II (INSTRUMENTS) ×10 IMPLANT
MARGIN MAP 10MM (MISCELLANEOUS) ×4 IMPLANT
NDL 21 GA WING INFUSION (NEEDLE) IMPLANT
NDL SAFETY ECLIPSE 18X1.5 (NEEDLE) ×4 IMPLANT
NEEDLE 21 GA WING INFUSION (NEEDLE) IMPLANT
NEEDLE HYPO 18GX1.5 SHARP (NEEDLE) ×4
NS IRRIG 1000ML POUR BTL (IV SOLUTION) ×5 IMPLANT
PACK BASIN MAJOR ARMC (MISCELLANEOUS) ×10 IMPLANT
PACK SPY-PHI (KITS) ×1 IMPLANT
PAD ABD DERMACEA PRESS 5X9 (GAUZE/BANDAGES/DRESSINGS) ×5 IMPLANT
PAD PREP 24X41 OB/GYN DISP (PERSONAL CARE ITEMS) ×8 IMPLANT
PIN SAFETY STRL (MISCELLANEOUS) ×5 IMPLANT
SET LOCALIZER 20 PROBE US (MISCELLANEOUS) ×1 IMPLANT
SLEVE PROBE SENORX GAMMA FIND (MISCELLANEOUS) ×5 IMPLANT
SPONGE T-LAP 18X18 ~~LOC~~+RFID (SPONGE) ×20 IMPLANT
STAPLER INSORB 30 2030 C-SECTI (MISCELLANEOUS) ×1 IMPLANT
STRIP CLOSURE SKIN 1/2X4 (GAUZE/BANDAGES/DRESSINGS) ×1 IMPLANT
SUT DVC VLOC 180 0 12IN GS21 (SUTURE) ×4
SUT ETHILON 3-0 FS-10 30 BLK (SUTURE) ×8
SUT ETHILON 4-0 (SUTURE)
SUT ETHILON 4-0 FS2 18XMFL BLK (SUTURE)
SUT MNCRL 4-0 (SUTURE) ×8
SUT MNCRL 4-0 27XMFL (SUTURE) ×6
SUT PDS II 3-0 (SUTURE) ×9 IMPLANT
SUT PDS PLUS 2 (SUTURE)
SUT PDS PLUS AB 2-0 CT-1 (SUTURE) ×8 IMPLANT
SUT SILK 2 0 SH (SUTURE) ×10 IMPLANT
SUT SILK 3-0 (SUTURE) ×5 IMPLANT
SUT V-LOC 90 ABS 3-0 VLT  V-20 (SUTURE) ×8
SUT V-LOC 90 ABS 3-0 VLT V-20 (SUTURE) ×4 IMPLANT
SUT VIC AB 3-0 54X BRD REEL (SUTURE) ×4 IMPLANT
SUT VIC AB 3-0 BRD 54 (SUTURE) ×4
SUT VIC AB 3-0 SH 27 (SUTURE) ×8
SUT VIC AB 3-0 SH 27X BRD (SUTURE) ×8 IMPLANT
SUT VIC AB 3-0 SH 8-18 (SUTURE) ×5 IMPLANT
SUTURE DVC VLC 180 0 12IN GS21 (SUTURE) IMPLANT
SUTURE EHLN 3-0 FS-10 30 BLK (SUTURE) ×4 IMPLANT
SUTURE ETHLN 4-0 FS2 18XMF BLK (SUTURE) IMPLANT
SUTURE MNCRL 4-0 27XMF (SUTURE) ×8 IMPLANT
SWABSTK COMLB BENZOIN TINCTURE (MISCELLANEOUS) IMPLANT
SYR 5ML LL (SYRINGE) IMPLANT
SYR BULB IRRIG 60ML STRL (SYRINGE) ×5 IMPLANT
WATER STERILE IRR 500ML POUR (IV SOLUTION) ×10 IMPLANT

## 2021-09-16 NOTE — Transfer of Care (Cosign Needed)
Immediate Anesthesia Transfer of Care Note ? ?Patient: Emma Cuevas ? ?Procedure(s) Performed: SIMPLE MASTECTOMY WITH AXILLARY SENTINEL NODE BIOPSY -- RF tag in axillary (Right: Breast) ?REMOVAL PORT-A-CATH ?RIGHT BREAST RECONSTRUCTION WITH PLACEMENT OF TISSUE EXPANDER AND FLEX HD (ACELLULAR HYDRATED DERMIS) (Right) ? ?Patient Location: PACU ? ?Anesthesia Type:General ? ?Level of Consciousness: drowsy and patient cooperative ? ?Airway & Oxygen Therapy: Patient Spontanous Breathing and Patient connected to face mask oxygen ? ?Post-op Assessment: Report given to RN and Post -op Vital signs reviewed and stable ? ?Post vital signs: Reviewed and stable ? ?Last Vitals:  ?Vitals Value Taken Time  ?BP 116/68 09/16/21 1739  ?Temp    ?Pulse 86 09/16/21 1742  ?Resp 27 09/16/21 1742  ?SpO2 100 % 09/16/21 1742  ?Vitals shown include unvalidated device data. ? ?Last Pain:  ?Vitals:  ? 09/16/21 1213  ?TempSrc: Temporal  ?PainSc: 0-No pain  ?   ? ?  ? ?Complications: No notable events documented. ?

## 2021-09-16 NOTE — Op Note (Signed)
Operative Note  ? ?DATE OF OPERATION: 09/16/2021 ? ?SURGICAL DEPARTMENT: Plastic Surgery ? ?PREOPERATIVE DIAGNOSES: Right breast cancer ? ?POSTOPERATIVE DIAGNOSES:  same ? ?PROCEDURE: 1.  Right breast reconstruction with tissue expander and acellular dermal matrix ?2.  Indocyanine green angiography right mastectomy flap ? ?SURGEON: Talmadge Coventry, MD ? ?ASSISTANT: Krista Blue, PA ?The advanced practice practitioner (APP) assisted throughout the case.  The APP was essential in retraction and counter traction when needed to make the case progress smoothly.  This retraction and assistance made it possible to see the tissue planes for the procedure.  The assistance was needed for hemostasis, tissue re-approximation and closure of the incision site.  ? ?ANESTHESIA:  General.  ? ?COMPLICATIONS: None.  ? ?INDICATIONS FOR PROCEDURE:  ?The patient, Emma Cuevas is a 56 y.o. female born on [birth year], is here for treatment of right breast cancer ?MRN: [id number] ? ?CONSENT:  ?Informed consent was obtained directly from the patient. Risks, benefits and alternatives were fully discussed. Specific risks including but not limited to bleeding, infection, hematoma, seroma, scarring, pain, contracture, asymmetry, wound healing problems, and need for further surgery were all discussed. The patient did have an ample opportunity to have questions answered to satisfaction.  ? ?DESCRIPTION OF PROCEDURE:  ?The patient was taken to the operating room. SCDs were placed and antibiotics were given.  General anesthesia was administered.  The patient's operative site was prepped and draped in a sterile fashion. A time out was performed and all information was confirmed to be correct.  Dr. [name] performed his portion of the case.  This was a right mastectomy with axillary node dissection.  Patient was then turned over to me.  I started by examining the skin flaps and clinically they looked great.  Indocyanine green angiography was  then performed demonstrating good perfusion of the entirety of the mastectomy flaps.  I then examined the wound and ensured hemostasis which was the case.  I then placed a 15 French drain into the axilla and secured with a nylon suture.  0 V-Loc was then utilized to tack the lateral soft tissues down to the chest wall to close off the dead space.  A second 65 French drain was then placed in the mastectomy pocket and secured with a nylon suture.  The Flex HD was then brought onto the field and rinsed in saline.  A 2-0 PDS was run around the periphery for a pursestring.  The expander was then brought onto the field.  This was a Neurosurgeon plus smooth tissue expander with the prescribed fill volume of 475 cc.  The air was removed and the expander was placed within the matrix and the pursestring tied down.  Suture tabs were then brought out at 3, 6, and 9:00.  3-0 PDS stay sutures were then placed into the chest wall at the corresponding locations.  Antibiotic irrigation was then carried out.  The stay sutures were then run through the suture tabs and the expander was then placed within the wound and secured by tying them down.  300 cc was then infiltrated to the expander which placed no tension on the incision.  Incision was then closed with interrupted buried in sorb staples and a running 3 OV lock.  Soft dressing was applied. ? ?The patient tolerated the procedure well.  There were no complications. The patient was allowed to wake from anesthesia, extubated and taken to the recovery room in satisfactory condition.  ? ?

## 2021-09-16 NOTE — Anesthesia Preprocedure Evaluation (Signed)
Anesthesia Evaluation  ?Patient identified by MRN, date of birth, ID band ?Patient awake ? ? ? ?Reviewed: ?Allergy & Precautions, H&P , NPO status , Patient's Chart, lab work & pertinent test results, reviewed documented beta blocker date and time  ? ?Airway ?Mallampati: II ? ?TM Distance: >3 FB ?Neck ROM: full ? ? ? Dental ? ?(+) Teeth Intact ?  ?Pulmonary ?neg pulmonary ROS,  ?  ?Pulmonary exam normal ? ? ? ? ? ? ? Cardiovascular ?Exercise Tolerance: Good ?negative cardio ROS ?Normal cardiovascular exam ?Rhythm:regular Rate:Normal ? ? ?  ?Neuro/Psych ?negative neurological ROS ? negative psych ROS  ? GI/Hepatic ?negative GI ROS, Neg liver ROS,   ?Endo/Other  ?negative endocrine ROS ? Renal/GU ?Renal diseasenegative Renal ROS  ?negative genitourinary ?  ?Musculoskeletal ? ? Abdominal ?  ?Peds ? Hematology ?negative hematology ROS ?(+) Blood dyscrasia, anemia ,   ?Anesthesia Other Findings ?Past Medical History: ?No date: Anemia ?2013: Benign neoplasm of breast ?    Comment:  left breast ?2015: BRCA negative ?No date: Breast cancer ([place]) ?No date: Cancer [facility]) ?No date: Family history of bladder cancer ?No date: Family history of breast cancer ?2013: Family history of malignant neoplasm of breast ?No date: Family history of pancreatic cancer ?No date: Screening for obesity ?Past Surgical History: ?2011: ABDOMINAL HYSTERECTOMY ?    Comment:  partial ?2013: BREAST BIOPSY; Left ?03/14/2021: BREAST BIOPSY; Right ?    Comment:  Stereo bx-anterior calcs, "coil" clip-path pending ?03/14/2021: BREAST BIOPSY; Right ?    Comment:  stereo bx-calcs, "Ribbon" clip-path pending ?03/14/2021: breast biopsy; Right ?    Comment:  [place] Bx, Axilla, path pending ?04/30/2021: BREAST BIOPSY; Left ?09/09/2021: BREAST BIOPSY WITH RADIO FREQUENCY LOCALIZER; Right ?    Comment:  RF tag in axilla ?10/16/2011: BREAST SURGERY; Left ?    Comment:  left breast finesse biopsy ?12/05/2014: COLONOSCOPY WITH PROPOFOL;  N/A ?    Comment:  Procedure: COLONOSCOPY WITH PROPOFOL;  Surgeon:  ?             Seeplaputhur Robinette Haines, MD;  Location: [facility] ENDOSCOPY;   ?             Service: Endoscopy;  Laterality: N/A; ?No date: DILATION AND CURETTAGE OF UTERUS ?05/06/2021: IR CV LINE INJECTION ?05/08/2021: IR IMAGING GUIDED PORT INSERTION ?04/02/2021: PORTACATH PLACEMENT; N/A ?    Comment:  Procedure: INSERTION PORT-A-CATH;  Surgeon:  ?             Herbert Pun, MD;  Location: [facility] ORS;  Service: ?             General;  Laterality: N/A; ?BMI   ? Body Mass Index: 30.45 kg/m?  ?  ? Reproductive/Obstetrics ?negative OB ROS ? ?  ? ? ? ? ? ? ? ? ? ? ? ? ? ?  ?  ? ? ? ? ? ? ? ? ?Anesthesia Physical ?Anesthesia Plan ? ?ASA: 2 ? ?Anesthesia Plan: General ETT  ? ?Post-op Pain Management:   ? ?Induction:  ? ?PONV Risk Score and Plan:  ? ?Airway Management Planned:  ? ?Additional Equipment:  ? ?Intra-op Plan:  ? ?Post-operative Plan:  ? ?Informed Consent: I have reviewed the patients History and Physical, chart, labs and discussed the procedure including the risks, benefits and alternatives for the proposed anesthesia with the patient or authorized representative who has indicated his/her understanding and acceptance.  ? ? ? ?Dental Advisory Given ? ?Plan Discussed with: CRNA ? ?Anesthesia Plan Comments:   ? ? ? ? ? ? ?  Anesthesia Quick Evaluation ? ?

## 2021-09-16 NOTE — Discharge Instructions (Signed)
Activity: Avoid strenuous activity.  No heavy lifting. ? ?Diet: No restrictions.  Try to optimize nutrition with plenty of fruits and vegetables to improve healing. ? ?Wound Care: Leave breast binder on for 3 days.  You may change the underlying gauze and ABD pads as needed if they get bloody.  Please measure and record the volume output from your drains.  You may shower after 5 days, sponge bathing in interim.  When showering, do not scrub the incision.  Leave Steri-Strips in place.  After breast binder comes off recommend front-clipping/zipping sports bra for gentle compression.  Avoid any bra with under-wire until cleared by Dr. [name]. ? ?Follow-Up: Scheduled for next week. ? ?Things to watch for:  Call the office if you experience fever, chills, persistent nausea, or significant bleeding.  Mild wound drainage is common after breast reduction surgery and should not be cause for alarm. ? ?

## 2021-09-16 NOTE — Anesthesia Procedure Notes (Signed)
Procedure Name: Intubation ?Date/Time: 09/16/2021 1:36 PM ?Performed by: Marshell Garfinkel, RN ?Pre-anesthesia Checklist: Patient identified, Emergency Drugs available, Suction available and Patient being monitored ?Patient Re-evaluated:Patient Re-evaluated prior to induction ?Oxygen Delivery Method: Circle system utilized ?Preoxygenation: Pre-oxygenation with 100% oxygen ?Induction Type: IV induction ?Ventilation: Mask ventilation without difficulty ?Laryngoscope Size: McGraph and 3 ?Grade View: Grade I ?Tube type: Oral ?Tube size: 6.5 mm ?Number of attempts: 1 ?Airway Equipment and Method: Stylet and Oral airway ?Placement Confirmation: ETT inserted through vocal cords under direct vision, positive ETCO2 and breath sounds checked- equal and bilateral ?Secured at: 21 cm ?Tube secured with: Tape ?Dental Injury: Teeth and Oropharynx as per pre-operative assessment  ? ? ? ? ?

## 2021-09-16 NOTE — Op Note (Addendum)
Preoperative diagnosis: Carcinoma of the right breast.  ? ?Postoperative diagnosis: Carcinoma of the right breast. ? ?Procedure: Right modified radical mastectomy. ?                    Removal of chemport.  ? ?Anesthesia: GETA ? ?Surgeon: Dr. [name] ? ?Wound Classification: Clean ?  ?Indications: Patient is a 56 y.o. female who had an abnormal mammogram that on workup with core needle biopsy was found to be invasive mammary carcinoma of the right breast. She was demonstrated to have axillary node involvement. Patient received neoadjuvant chemotherapy. After discussion of alternatives, the patient would benefit best of total mastectomy and sentinel lymph node biopsy vs modified radical mastectomy as oncologic treatment. Today the patient notified that Medical Oncology decided that no more chemotherapy will be needed and patient requested removal of chemport.  ? ?Findings: ?1. No palpable masses or abnormal lymph node ?2. No blue lymph nodes ?3. One weak hot lymph node as sentinel lymph node biopsy ? ? ?Description of procedure: The patient was brought to the operating room and the site of surgery was confirmed. General anesthesia was induced. A time-out was completed verifying correct patient, procedure, site, positioning, and implant(s) and/or special equipment prior to beginning this procedure. The breast, chest wall, axilla, and upper arm and neck were prepped and draped in the usual sterile fashion.  ?A skin incision was made that encompassed the nipple-areola complex across the breast. Flaps were raised in the avascular plane between subcutaneous tissue and breast tissue from clavicle superiorly, the sternum medially, the anterior rectus sheath inferiorly, and the anterior border of the latissimus dorsi muscle laterally. Hemostasis was achieved in the flaps. Next, the breast tissue and underlying pectoralis fascia were excised from the pectoralis major muscle, progressing from medially to laterally. At the  lateral border of the pectoralis major muscle, the breast tissue was swung laterally and dissection progressed under the muscle.  ?The clavipectoral fascia was then incised along the edge of the pectoralis major and the pectoralis major and minor were freed from surrounding fat and nodal tissue. Dissection progressed first under the pectoralis major and then under the pectoralis minor muscle.  ?With localizer, the RF tag was identified and dissection was carried down to the RF tag were the previous biopsied lymph node was identified and resected. One lymph node with very low signal was also identified and dissected. This were sent for frozen section. Pathology reported that the weak sentinel lymph node was negative but the previous biopsied nodule showed atypical cells.  ?Sine I was not able to get three sentinel lymph nodes, decision was to proceed with axillary node dissection.  ?The pectoral muscles were retracted medially with a Richardson retractor. The medial pectoral neurovascular bundle was identified and preserved. The level II nodal tissue deep in the pectoralis minor was included in the dissection. The axillary vein was then identified and cleared of overlying fat. The first branch off the axillary vein was clamped, divided, and tied with 2-0 silk ties. The thoracodorsal nerve was then identified deep to the ligated vein and preserved. The long thoracic nerve was then identified along the edge of the latissimus dorsi on the chest wall and preserved. The remaining nodal tissue between these nerves was then carefully removed, taking care to protect the nerves. The specimen, consisting of breast and attached nodal tissue, was then excised by dividing the remaining lateral pedical and sent to pathology. Another portion of axillary tissue was removed and  sent separately.  ?The wound was left open for placement of expender by Dr. [name].  ? ?An incision was done over the palpable chemoport. Dissection was carried  down to the chemoport. The chemoport sutures were cut and the chemoport was removed. The capsule was excised. The wound was approximated with 3-0 vicryl and skin closed with 4-0 Monocryl. Wound covered with Dermabond.  ? ?The patient tolerated the procedure well and was taken to the postanesthesia care unit in stable condition.  ? ?Sentinel Node Biopsy Synoptic Operative Report ? ?Operation performed with curative intent:Yes ? ?Tracer(s) used to identify sentinel nodes in the upfront surgery (non-neoadjuvant) setting (select all that apply):N/A ? ?Tracer(s) used to identify sentinel nodes in the neoadjuvant setting (select all that apply):Dye and Radioactive Tracer ? ?All nodes (colored or non-colored) present at the end of a dye-filled lymphatic channel were removed:N/A (none visualized) ? ?All significantly radioactive nodes were removed:Yes ? ?All palpable suspicious nodes were removed:N/A ? ?Biopsy-proven positive nodes marked with clips prior to chemotherapy were identified and removed:Yes ? ?Specimen: Right  Breast ?                   Biopsied node ?                   Sentinel lymph node #1 ?                   Additional axillary tissue ? ?Complications: None ? ?Estimated Blood Loss: 50 mL ? ?

## 2021-09-16 NOTE — Anesthesia Postprocedure Evaluation (Signed)
Anesthesia Post Note ? ?Patient: ASHERAH LAVOY ? ?Procedure(s) Performed: SIMPLE MASTECTOMY WITH AXILLARY SENTINEL NODE BIOPSY -- RF tag in axillary (Right: Breast) ?REMOVAL PORT-A-CATH ?RIGHT BREAST RECONSTRUCTION WITH PLACEMENT OF TISSUE EXPANDER AND FLEX HD (ACELLULAR HYDRATED DERMIS) (Right) ? ?Patient location during evaluation: PACU ?Anesthesia Type: General ?Level of consciousness: awake and alert ?Pain management: pain level controlled ?Vital Signs Assessment: post-procedure vital signs reviewed and stable ?Respiratory status: spontaneous breathing, nonlabored ventilation, respiratory function stable and patient connected to nasal cannula oxygen ?Cardiovascular status: blood pressure returned to baseline and stable ?Postop Assessment: no apparent nausea or vomiting ?Anesthetic complications: no ? ? ?No notable events documented. ? ? ?Last Vitals:  ?Vitals:  ? 09/16/21 1900 09/16/21 1915  ?BP: 122/74 122/75  ?Pulse: 84 84  ?Resp: 13 11  ?Temp:    ?SpO2: 94% 94%  ?  ?Last Pain:  ?Vitals:  ? 09/16/21 1845  ?TempSrc:   ?PainSc: Asleep  ? ? ?  ?  ?  ?  ?  ?  ? ?Precious Haws Vanassa Penniman ? ? ? ? ?

## 2021-09-16 NOTE — Interval H&P Note (Signed)
History and Physical Interval Note: ? ?09/16/2021 ?1:28 PM ? ?Emma Cuevas  has presented today for surgery, with the diagnosis of C50.411 Malignant neoplasm of upper-outer quadrant of rt female breast, unspecified edtrogen receptor.  The various methods of treatment have been discussed with the patient and family. After consideration of risks, benefits and other options for treatment, the patient has consented to  Procedure(s): ?SIMPLE MASTECTOMY WITH AXILLARY SENTINEL NODE BIOPSY -- RF tag in axillary (Right) ?RIGHT BREAST RECONSTRUCTION WITH PLACEMENT OF TISSUE EXPANDER AND FLEX HD (ACELLULAR HYDRATED DERMIS) (Right) and removal of chemoport as a surgical intervention.  The patient's history has been reviewed, patient examined, no change in status, stable for surgery.  I have reviewed the patient's chart and labs.  Questions were answered to the patient's satisfaction.   ? ? ?Colten Desroches C[place] ? ? ?

## 2021-09-17 ENCOUNTER — Encounter: Payer: Self-pay | Admitting: General Surgery

## 2021-09-17 DIAGNOSIS — C50411 Malignant neoplasm of upper-outer quadrant of right female breast: Secondary | ICD-10-CM | POA: Diagnosis not present

## 2021-09-17 LAB — BASIC METABOLIC PANEL
Anion gap: 5 (ref 5–15)
BUN: 18 mg/dL (ref 6–20)
CO2: 27 mmol/L (ref 22–32)
Calcium: 8.9 mg/dL (ref 8.9–10.3)
Chloride: 108 mmol/L (ref 98–111)
Creatinine, Ser: 0.96 mg/dL (ref 0.44–1.00)
GFR, Estimated: >60 mL/min (ref >60)
Glucose, Bld: 135 mg/dL — ABNORMAL HIGH (ref 70–99)
Potassium: 4.2 mmol/L (ref 3.5–5.1)
Sodium: 140 mmol/L (ref 135–145)

## 2021-09-17 LAB — CBC
HCT: 29.3 % — ABNORMAL LOW (ref 36.0–46.0)
Hemoglobin: 9.2 g/dL — ABNORMAL LOW (ref 12.0–15.0)
MCH: 29.8 pg (ref 26.0–34.0)
MCHC: 31.4 g/dL (ref 30.0–36.0)
MCV: 94.8 fL (ref 80.0–100.0)
Platelets: 212 10*3/uL (ref 150–400)
RBC: 3.09 MIL/uL — ABNORMAL LOW (ref 3.87–5.11)
RDW: 13.9 % (ref 11.5–15.5)
WBC: 11 10*3/uL — ABNORMAL HIGH (ref 4.0–10.5)
nRBC: 0 % (ref 0.0–0.2)

## 2021-09-17 NOTE — Plan of Care (Signed)
VSS, patient alert and oriented.  IV removed.  Husband taught how to care for and empty JP drains.  Discharge instructions reviewed and patient discharged to home. ?

## 2021-09-17 NOTE — Plan of Care (Signed)

## 2021-09-17 NOTE — TOC Initial Note (Signed)
Transition of Care (TOC) - Initial/Assessment Note  ? ? ?Patient Details  ?Name: Emma Cuevas ?MRN: [id number] ?Date of Birth: Jun 09, 1965 ? ?Transition of Care (TOC) CM/SW Contact:    ?Laurena Slimmer, RN ?Phone Number: ?09/17/2021, 9:51 AM ? ?Clinical Narrative:                 ? ? ?Transition of Care (TOC) Screening Note ? ? ?Patient Details  ?Name: Emma Cuevas ?Date of Birth: 1965-08-16 ? ? ?Transition of Care (TOC) CM/SW Contact:    ?Laurena Slimmer, RN ?Phone Number: ?09/17/2021, 9:51 AM ? ? ? ?Transition of Care Department [facility]) has reviewed patient and no TOC needs have been identified at this time. We will continue to monitor patient advancement through interdisciplinary progression rounds. If new patient transition needs arise, please place a TOC consult. ? ? ?  ?  ? ? ?Patient Goals and CMS Choice ?  ?  ?  ? ?Expected Discharge Plan and Services ?  ?  ?  ?  ?  ?Expected Discharge Date: 09/17/21               ?  ?  ?  ?  ?  ?  ?  ?  ?  ?  ? ?Prior Living Arrangements/Services ?  ?  ?  ?       ?  ?  ?  ?  ? ?Activities of Daily Living ?Home Assistive Devices/Equipment: None ?ADL Screening (condition at time of admission) ?Patient's cognitive ability adequate to safely complete daily activities?: Yes ?Is the patient deaf or have difficulty hearing?: No ?Does the patient have difficulty seeing, even when wearing glasses/contacts?: No ?Does the patient have difficulty concentrating, remembering, or making decisions?: No ?Patient able to express need for assistance with ADLs?: Yes ?Does the patient have difficulty dressing or bathing?: No ?Independently performs ADLs?: Yes (appropriate for developmental age) ?Does the patient have difficulty walking or climbing stairs?: No ?Weakness of Legs: None ?Weakness of Arms/Hands: None ? ?Permission Sought/Granted ?  ?  ?   ?   ?   ?   ? ?Emotional Assessment ?  ?  ?  ?  ?  ?  ? ?Admission diagnosis:  Breast cancer ([place]) [C50.919] ?Patient Active Problem List  ? Diagnosis  Date Noted  ? Breast cancer ([place]) 09/16/2021  ? Genetic testing 05/06/2021  ? Family history of pancreatic cancer 04/23/2021  ? Family history of bladder cancer 04/23/2021  ? Carcinoma of breast, estrogen and progesterone receptor positive ([place]) 03/25/2021  ? Goals of care, counseling/discussion 03/25/2021  ? Menopause 10/05/2016  ? Mild renal insufficiency 10/05/2016  ? Benign neoplasm of breast 10/27/2012  ? Family history of breast cancer 10/27/2012  ? ?PCP:  Derinda Late, MD ?Pharmacy:   ?[facility] [place] [place] ?[place] ?[place] [address] ?Phone: [phone] Fax: [phone] ? ?[place], [place] ?[place] ?[place] [address] ?Phone: [phone] Fax: [phone] ? ? ? ? ?Social Determinants of Health (SDOH) Interventions ?  ? ?Readmission Risk Interventions ?   ? View : No data to display.  ?  ?  ?  ? ? ? ?

## 2021-09-17 NOTE — Discharge Summary (Signed)
?  Patient ID: ?Emma Cuevas ?MRN: [id number] ?DOB/AGE: 1966-05-22 56 y.o. ? ?Admit date: 09/16/2021 ?Discharge date: 09/17/2021 ? ? ?Discharge Diagnoses:  ?Principal Problem: ?  Breast cancer ([place]) ? ? ?Procedures: Right modified radical mastectomy with immediate reconstruction ? ?Hospital Course: Patient with right breast cancer.  She underwent right total mastectomy with attempted sentinel node biopsy.  Unable to get 3 convincing sentinel lymph nodes so axillary node dissection was done.  Previous biopsied positive lymph node resected showing atypical cells concerning of posterior malignancy. ? ?The patient is recovering adequately.  This morning patient with pain control.  Patient is tolerating diet.  The wound looks with viable healthy flaps.  Drain in place.  Adequate output. ? ?Physical Exam ?Vitals reviewed.  ?HENT:  ?   Head: Normocephalic.  ?Cardiovascular:  ?   Rate and Rhythm: Normal rate and regular rhythm.  ?Pulmonary:  ?   Effort: Pulmonary effort is normal.  ?Chest:  ?Breasts: ?   Right: Absent.  ?   Comments: Viable skin flaps.  Drain in place. ?Abdominal:  ?   General: Abdomen is flat.  ?Musculoskeletal:  ?   Cervical back: Normal range of motion.  ?Skin: ?   General: Skin is warm.  ?   Capillary Refill: Capillary refill takes less than 2 seconds.  ?Neurological:  ?   Mental Status: She is alert and oriented to person, place, and time.  ? ?Consults: None ? ?Disposition: Discharge disposition: 01-Home or Self Care ? ? ? ? ? ? ?Discharge Instructions   ? ? Diet - low sodium heart healthy   Complete by: As directed ?  ? Increase activity slowly   Complete by: As directed ?  ? ?  ? ?Allergies as of 09/17/2021   ? ?   Reactions  ? Tegaderm Ag Mesh [silver] Rash  ? Itchy/buring tiny red bump  ? Tape   ? Redness and burning  ? ?  ? ?  ?Medication List  ?  ? ?TAKE these medications   ? ?acetaminophen 500 MG tablet ?Commonly known as: TYLENOL ?Take 1,000 mg by mouth every 6 (six) hours as needed for moderate  pain. ?  ?Calcium Carb-Cholecalciferol 500-10 MG-MCG Tabs ?Take 1 tablet by mouth every morning. ?  ?ondansetron 4 MG disintegrating tablet ?Commonly known as: ZOFRAN-ODT ?Take 1 tablet (4 mg total) by mouth every 8 (eight) hours as needed for nausea or vomiting. ?  ?sulfamethoxazole-trimethoprim 800-160 MG tablet ?Commonly known as: BACTRIM DS ?Take 1 tablet by mouth 2 (two) times daily for 14 days. ?  ? ?  ? ? Follow-up Information   ? ? Herbert Pun, MD Follow up in 2 week(s).   ?Specialty: General Surgery ?Contact information: ?[place] [address] ?[place] [address] ?[phone] ? ? ?  ?  ? ?  ?  ? ?  ? ? ? ?

## 2021-09-18 ENCOUNTER — Encounter: Payer: Self-pay | Admitting: General Surgery

## 2021-09-24 ENCOUNTER — Ambulatory Visit (INDEPENDENT_AMBULATORY_CARE_PROVIDER_SITE_OTHER): Payer: Managed Care, Other (non HMO) | Admitting: Physician Assistant

## 2021-09-24 DIAGNOSIS — Z9889 Other specified postprocedural states: Secondary | ICD-10-CM

## 2021-09-24 NOTE — Progress Notes (Signed)
Patient is a 56 year old female with history of right-sided breast cancer s/p right-sided total mastectomy with immediate reconstruction using tissue expander and Flex HD performed 09/16/2021 by Dr. [name] and Dr. [name]. ? ?At time of mastectomy, decision was made for complete axillary lymph node dissection.  Her Chemo-Port was also removed by general surgery.  Patient required multiple JP drains given lymph node dissection.  Expander was filled to a total of 300/475 cc. ? ?Today, patient is doing well.  She is accompanied by her husband at bedside.  She has been wearing compressive bra and taking Tylenol for discomfort.  She has not required any narcotic analgesics since day 3 postop.  They have been monitoring her volume output from JP drains and they are consistently downtrending.  Remain functional.  She has been mitigating her activity.  Overall, she feels as though she is doing well. ? ?Physical exam is entirely reassuring.  The bandages that were placed at time of surgery were removed here today without complication or difficulty.  No significant drainage appreciated.  Steri-Strips remain firmly intact.  No areas of erythema or obvious subcutaneous fluid collections.  The JP drain tube insertion sites appear healthy, not irritated.  Flaps are well vascularized, no dusky areas otherwise concerning for tissue necrosis. ? ?Transition patient back into her compressive bra and placed an ABD pad along right breast for comfort.  Added 4 x 4 gauze underneath JP drain insertion site to collect any drainage that might come from around the tube insertion sites.  Continue with activity modifications and compressive garments. ? ?Suspect that she will be able to have her drains removed at an appointment next week as well as have her first expander fill.  She has been told by her oncologist, Dr. Janese Banks, that she will need postoperative RT.  Unclear as to whether or not this will be before or after implant exchange, but  suspect it will be before.  She meets with her later this week and will inquire. ?

## 2021-09-25 ENCOUNTER — Telehealth: Payer: Self-pay | Admitting: *Deleted

## 2021-09-25 NOTE — Telephone Encounter (Signed)
Patient called to say that she had a upcoming appointment for port labs and port flush.  Patient has gotten the port out now.  He wanted to make sure does she still have to come for the labs.  Anderson Malta reached out to Dr. Janese Banks about the labs and she said she does not need this time.  We canceled the appointment.  And Anderson Malta called and told her that she did not have to come for that appointment everything has been discontinued for that visit. ?

## 2021-09-26 ENCOUNTER — Encounter: Payer: Self-pay | Admitting: Oncology

## 2021-09-26 ENCOUNTER — Telehealth: Payer: Self-pay | Admitting: Pharmacy Technician

## 2021-09-26 ENCOUNTER — Inpatient Hospital Stay: Payer: Managed Care, Other (non HMO) | Attending: Nurse Practitioner | Admitting: Oncology

## 2021-09-26 ENCOUNTER — Inpatient Hospital Stay: Payer: Managed Care, Other (non HMO)

## 2021-09-26 ENCOUNTER — Encounter: Payer: Self-pay | Admitting: Pharmacist

## 2021-09-26 ENCOUNTER — Other Ambulatory Visit (HOSPITAL_COMMUNITY): Payer: Self-pay

## 2021-09-26 VITALS — BP 101/65 | HR 91 | Temp 98.4°F | Resp 16 | Wt 183.4 lb

## 2021-09-26 DIAGNOSIS — Z9221 Personal history of antineoplastic chemotherapy: Secondary | ICD-10-CM | POA: Insufficient documentation

## 2021-09-26 DIAGNOSIS — Z888 Allergy status to other drugs, medicaments and biological substances status: Secondary | ICD-10-CM | POA: Diagnosis not present

## 2021-09-26 DIAGNOSIS — Z5111 Encounter for antineoplastic chemotherapy: Secondary | ICD-10-CM | POA: Diagnosis present

## 2021-09-26 DIAGNOSIS — Z7189 Other specified counseling: Secondary | ICD-10-CM | POA: Diagnosis not present

## 2021-09-26 DIAGNOSIS — Z800 Family history of malignant neoplasm of digestive organs: Secondary | ICD-10-CM | POA: Diagnosis not present

## 2021-09-26 DIAGNOSIS — Z808 Family history of malignant neoplasm of other organs or systems: Secondary | ICD-10-CM | POA: Insufficient documentation

## 2021-09-26 DIAGNOSIS — I890 Lymphedema, not elsewhere classified: Secondary | ICD-10-CM | POA: Diagnosis not present

## 2021-09-26 DIAGNOSIS — Z803 Family history of malignant neoplasm of breast: Secondary | ICD-10-CM | POA: Insufficient documentation

## 2021-09-26 DIAGNOSIS — Z170 Estrogen receptor positive status [ER+]: Secondary | ICD-10-CM | POA: Diagnosis not present

## 2021-09-26 DIAGNOSIS — Z8052 Family history of malignant neoplasm of bladder: Secondary | ICD-10-CM | POA: Insufficient documentation

## 2021-09-26 DIAGNOSIS — C773 Secondary and unspecified malignant neoplasm of axilla and upper limb lymph nodes: Secondary | ICD-10-CM | POA: Diagnosis not present

## 2021-09-26 DIAGNOSIS — C50811 Malignant neoplasm of overlapping sites of right female breast: Secondary | ICD-10-CM | POA: Diagnosis present

## 2021-09-26 DIAGNOSIS — C50911 Malignant neoplasm of unspecified site of right female breast: Secondary | ICD-10-CM | POA: Diagnosis not present

## 2021-09-26 DIAGNOSIS — Z86018 Personal history of other benign neoplasm: Secondary | ICD-10-CM | POA: Insufficient documentation

## 2021-09-26 NOTE — Telephone Encounter (Signed)
Oral Oncology Patient Advocate Encounter ? ?Prior Authorization for Melynda Keller has been approved.   ? ?PA# [id number] ?Effective dates: 09/26/21 through 09/27/22 ? ?Patient must use [employer] account executive.  Can use a copay card if high copay.  ? ?**Patient is not starting medication yet. Per Dr Janese Banks she will need in about 3 months.** ? ?Oral Oncology Clinic will continue to follow.  ? ? ?Dennison Nancy CPHT ?Specialty Pharmacy Patient Advocate ?[place] ?Phone [phone] ?Fax [phone] ?09/26/2021 12:16 PM ? ?

## 2021-09-26 NOTE — Progress Notes (Signed)
? ? ? ?Hematology/Oncology Consult note ?[place]  ?[phone] [phone] ? ?Patient Care Team: ?Derinda Late, MD as PCP - General (Family Medicine) ?Sindy Guadeloupe, MD as Consulting Physician (Hematology and Oncology)  ? ?Name of the patient: Emma Cuevas  ?[id number]  ?01-Oct-1965  ? ?Date of visit: 09/26/21 ? ?Diagnosis- clinical prognostic stage IIIa right breast cancer T3 N1 M0 ER/PR positive HER2 negative ? ?Chief complaint/ Reason for visit-discuss final pathology results and further management ? ?Heme/Onc history:  Patient is a 56 year old female who underwent a routine bilateral screening mammogram in September 2022 which showed calcifications in the right breast and prominent right axillary lymph node concerning for malignancy.  This was followed by diagnostic mammogram and ultrasound.  Mammogram showed extensive suspicious pleomorphic calcifications measuring up to 6.8 cm.  2 abnormal lymph nodes were seen in the right axilla on ultrasound as well.  Both the calcifications and the lymph node was biopsied.  Anterior and posterior end of the calcifications was consistent with invasive mammary carcinoma grade 3.  Lymph node was positive for metastatic carcinoma as well.  ER 91 to 100% positive PR 41 to 50% positive and HER2 negative.  Ki-67 40% ?  ?MRI bilateral breasts showed non-mass enhancement involving the upper outer lower outer and upper inner quadrants finding 10.9 x 8.4 x 6.8 cm.  Non-mass enhancement in the left breast spanning 1.6 x 1.3 x 0.8 cm.  6 metastatic right axillary lymph nodes. ?  ?CT chest abdomen and pelvis with contrast showed mild right axillary adenopathy compatible with metastatic disease but no evidence of distant metastatic disease.  5 mm left lower lobe nodule.  Bone scan was negative for metastatic disease. ?Patient completed neoadjuvant chemotherapy with dose dense AC followed by Taxol.  She underwent right mastectomy with  immediate reconstruction on 09/16/2021.   ? ?Final pathology showedMultiple foci of invasive mammary carcinoma the largest focus being 3 mm with extensive lymphovascular invasion.  Tumor invading this dermis of the skin without skin ulceration.  1 micrometastatic carcinoma in the lymph node and 1 out of 8 metastatic carcinoma in the nonsentinel lymph nodes.  Margins negative. ? ? ?Interval history-patient is recovering well from surgery.  She still has breast drains in place.  Ports some soreness in her right upper extremity. ? ?ECOG PS- 1 ?Pain scale- 0 ? ? ?Review of systems- Review of Systems  ?Constitutional:  Negative for chills, fever, malaise/fatigue and weight loss.  ?HENT:  Negative for congestion, ear discharge and nosebleeds.   ?Eyes:  Negative for blurred vision.  ?Respiratory:  Negative for cough, hemoptysis, sputum production, shortness of breath and wheezing.   ?Cardiovascular:  Negative for chest pain, palpitations, orthopnea and claudication.  ?Gastrointestinal:  Negative for abdominal pain, blood in stool, constipation, diarrhea, heartburn, melena, nausea and vomiting.  ?Genitourinary:  Negative for dysuria, flank pain, frequency, hematuria and urgency.  ?Musculoskeletal:  Negative for back pain, joint pain and myalgias.  ?Skin:  Negative for rash.  ?Neurological:  Negative for dizziness, tingling, focal weakness, seizures, weakness and headaches.  ?Endo/Heme/Allergies:  Does not bruise/bleed easily.  ?Psychiatric/Behavioral:  Negative for depression and suicidal ideas. The patient does not have insomnia.    ? ? ? ?Allergies  ?Allergen Reactions  ? Tegaderm Ag Mesh [Silver] Rash  ?  Itchy/buring tiny red bump  ? Tape   ?  Redness and burning  ? ? ? ?Past Medical History:  ?Diagnosis Date  ? Anemia   ? Benign neoplasm  of breast 2013  ? left breast  ? BRCA negative 2015  ? Breast cancer ([place])   ? Cancer [facility])   ? Family history of bladder cancer   ? Family history of breast cancer   ? Family history  of malignant neoplasm of breast 2013  ? Family history of pancreatic cancer   ? Screening for obesity   ? ? ? ?Past Surgical History:  ?Procedure Laterality Date  ? ABDOMINAL HYSTERECTOMY  2011  ? partial  ? BREAST BIOPSY Left 2013  ? BREAST BIOPSY Right 03/14/2021  ? Stereo bx-anterior calcs, "coil" clip-path pending  ? BREAST BIOPSY Right 03/14/2021  ? stereo bx-calcs, "Ribbon" clip-path pending  ? breast biopsy Right 03/14/2021  ? [place] Bx, Axilla, path pending  ? BREAST BIOPSY Left 04/30/2021  ? BREAST BIOPSY WITH RADIO FREQUENCY LOCALIZER Right 09/09/2021  ? RF tag in axilla  ? BREAST RECONSTRUCTION WITH PLACEMENT OF TISSUE EXPANDER AND FLEX HD (ACELLULAR HYDRATED DERMIS) Right 09/16/2021  ? Procedure: RIGHT BREAST RECONSTRUCTION WITH PLACEMENT OF TISSUE EXPANDER AND FLEX HD (ACELLULAR HYDRATED DERMIS);  Surgeon: Cindra Presume, MD;  Location: [facility] ORS;  Service: Plastics;  Laterality: Right;  ? BREAST SURGERY Left 10/16/2011  ? left breast finesse biopsy  ? COLONOSCOPY WITH PROPOFOL N/A 12/05/2014  ? Procedure: COLONOSCOPY WITH PROPOFOL;  Surgeon: Christene Lye, MD;  Location: [facility];  Service: Endoscopy;  Laterality: N/A;  ? DILATION AND CURETTAGE OF UTERUS    ? IR CV LINE INJECTION  05/06/2021  ? IR IMAGING GUIDED PORT INSERTION  05/08/2021  ? PORT-A-CATH REMOVAL  09/16/2021  ? Procedure: REMOVAL PORT-A-CATH;  Surgeon: Herbert Pun, MD;  Location: [facility] OR[facility];  Service: General;;  ? PORTACATH PLACEMENT N/A 04/02/2021  ? Procedure: INSERTION PORT-A-CATH;  Surgeon: Herbert Pun, MD;  Location: [facility] OR[facility];  Service: General;  Laterality: N/A;  ? SIMPLE MASTECTOMY WITH AXILLARY SENTINEL NODE BIOPSY Right 09/16/2021  ? Procedure: SIMPLE MASTECTOMY WITH AXILLARY SENTINEL NODE BIOPSY -- RF tag in axillary;  Surgeon: Herbert Pun, MD;  Location: [facility];  Service: General;  Laterality: Right;  ? ? ?Social History  ? ?Socioeconomic History  ? Marital status: Married  ?  Spouse name:  Legrand Como  ? Number of children: Not on file  ? Years of education: Not on file  ? Highest education level: Not on file  ?Occupational History  ? Not on file  ?Tobacco Use  ? Smoking status: Never  ? Smokeless tobacco: Never  ?Vaping Use  ? Vaping Use: Never used  ?Substance and Sexual Activity  ? Alcohol use: No  ? Drug use: No  ? Sexual activity: Not on file  ?Other Topics Concern  ? Not on file  ?Social History Narrative  ? Married lives at home with husband  ? ?Social Determinants of Health  ? ?Financial Resource Strain: Not on file  ?Food Insecurity: Not on file  ?Transportation Needs: Not on file  ?Physical Activity: Not on file  ?Stress: Not on file  ?Social Connections: Not on file  ?Intimate Partner Violence: Not on file  ? ? ?Family History  ?Problem Relation Age of Onset  ? Breast cancer Mother 38  ? Bladder Cancer Father 73  ? Breast cancer Paternal Aunt 80  ? Pancreatic cancer Paternal Uncle 53  ? Pancreatic cancer Maternal Grandfather 45  ? Cancer Cousin   ?     in ear  ? ? ? ?Current Outpatient Medications:  ?  acetaminophen (TYLENOL) 500 MG tablet, Take  1,000 mg by mouth every 6 (six) hours as needed for moderate pain., Disp: , Rfl:  ?  Calcium Carb-Cholecalciferol 500-10 MG-MCG TABS, Take 1 tablet by mouth every morning., Disp: , Rfl:  ?  sulfamethoxazole-trimethoprim (BACTRIM DS) 800-160 MG tablet, Take 1 tablet by mouth 2 (two) times daily. COURSE WILL BE COMPLETED AT THE END OF THIS WEEK., Disp: , Rfl:  ?  ondansetron (ZOFRAN-ODT) 4 MG disintegrating tablet, Take 1 tablet (4 mg total) by mouth every 8 (eight) hours as needed for nausea or vomiting. (Patient not taking: Reported on 09/26/2021), Disp: 20 tablet, Rfl: 0 ? ?Physical exam:  ?Vitals:  ? 09/26/21 1002  ?BP: 101/65  ?Pulse: 91  ?Resp: 16  ?Temp: 98.4 ?F (36.9 ?C)  ?SpO2: 98%  ?Weight: 183 lb 6.4 oz (83.2 kg)  ? ?Physical Exam ?Constitutional:   ?   General: She is not in acute distress. ?Cardiovascular:  ?   Rate and Rhythm: Normal rate and  regular rhythm.  ?   Heart sounds: Normal heart sounds.  ?Pulmonary:  ?   Effort: Pulmonary effort is normal.  ?   Breath sounds: Normal breath sounds.  ?Skin: ?   General: Skin is warm and dry.  ?Neurologica

## 2021-09-26 NOTE — Telephone Encounter (Signed)
Oral Oncology Patient Advocate Encounter ?  ?Received notification from [employer] that prior authorization for Verzenio is required. ?  ?PA submitted on CoverMyMeds ?Key [id number] ?Status is pending ?  ?Oral Oncology Clinic will continue to follow. ? ?Dennison Nancy CPHT ?Specialty Pharmacy Patient Advocate ?[place] ?Phone [phone] ?Fax [phone] ?09/26/2021 12:15 PM ? ?

## 2021-09-29 ENCOUNTER — Other Ambulatory Visit: Payer: Self-pay | Admitting: Pathology

## 2021-09-29 ENCOUNTER — Encounter: Payer: Self-pay | Admitting: General Surgery

## 2021-09-29 LAB — SURGICAL PATHOLOGY

## 2021-09-30 ENCOUNTER — Ambulatory Visit
Admission: RE | Admit: 2021-09-30 | Discharge: 2021-09-30 | Disposition: A | Discharge status: Home / Self Care | Payer: Managed Care, Other (non HMO) | Source: Ambulatory Visit | Attending: Radiation Oncology | Admitting: Radiation Oncology

## 2021-09-30 VITALS — BP 101/61 | Wt 184.0 lb

## 2021-09-30 DIAGNOSIS — Z170 Estrogen receptor positive status [ER+]: Secondary | ICD-10-CM

## 2021-09-30 NOTE — Consult Note (Signed)
?NEW PATIENT EVALUATION ? ?Name: Emma Cuevas [facility]  ?MRN: [id number]  ?Date:   09/30/2021     ?DOB: [birth year] ? ? ?This 56 y.o. female patient presents to the clinic for initial evaluation of stage IIa (T1 aN1 aM0) ER/PR positive invasive mammary carcinoma of the right breast status post neoadjuvant chemotherapy followed by modified radical mastectomy with immediate reconstruction. ? ?REFERRING PHYSICIAN: Derinda Late, MD ? ?CHIEF COMPLAINT:  ?Chief Complaint  ?Patient presents with  ? Consult  ?  Breast  ? ? ?DIAGNOSIS: There were no encounter diagnoses. ?  ?PREVIOUS INVESTIGATIONS:  ?Mammograms and ultrasound reviewed breast MRI reviewed ?Clinical notes reviewed ?Pathology reports reviewed ? ?HPI: Patient is a 56 year old female who presented with abnormal screening mammogram showing calcifications in the right breast as well as prominent right axillary lymph nodes.  Tomogram revealed extensive suspicious pleomorphic calcifications upon involving the upper outer right breast measuring 6.8 cm.  Targeted ultrasound was performed which was positive for grade 3 invasive mammary carcinoma of the right breast ER/PR positive HER2 2+.  Right axillary lymph node was also positive for metastatic mammary carcinoma.  MRI of her breast showed a 10.9 x 8.4 x 6.8 cm area of proven malignancy extending from the anterior posterior aspect of the right breast involving 3 quadrants.  There were 6 metastatic right axillary lymph nodes.  Bone scan showed no evidence of metastatic disease.  Patient went dose-dense neoadjuvant chemotherapy with AC followed by Taxol.  She then underwent on 09/16/2021 a right modified radical mastectomy with immediate reconstruction.  Final path showed multiple foci of invasive mammary carcinoma largest focus being 3 mm with extensive lymph-vascular invasion.  The tumor invaded the dermis of the skin without ulceration.  She had 1 micrometastatic carcinoma in lymph node and 1 out of 8 metastatic carcinoma  nonsentinel lymph nodes margins were negative.  Left breast was biopsied showing fibrocystic changes with calcifications.  Right breast was overall grade 3 carcinoma directly invaded the deep dermis without skin ulceration.  Lymph and vascular invasion were present.  Primary tumor bed was 6 x 3 cm.  Tumor post treatment was pT1 apN1 aM0.  Patient still has drains present is in the process of expanders placed in her right breast being advanced over time.  She is doing fairly well.  Seen today for radiation oncology consideration. ? ?PLANNED TREATMENT REGIMEN: Postmastectomy radiation to right chest wall and peripheral lymphatics ? ?PAST MEDICAL HISTORY:  has a past medical history of Anemia, Benign neoplasm of breast (2013), BRCA negative (2015), Breast cancer ([place]), Cancer ([place]), Family history of bladder cancer, Family history of breast cancer, Family history of malignant neoplasm of breast (2013), Family history of pancreatic cancer, and Screening for obesity.   ? ?PAST SURGICAL HISTORY:  ?Past Surgical History:  ?Procedure Laterality Date  ? ABDOMINAL HYSTERECTOMY  2011  ? partial  ? BREAST BIOPSY Left 2013  ? BREAST BIOPSY Right 03/14/2021  ? Stereo bx-anterior calcs, "coil" clip-path pending  ? BREAST BIOPSY Right 03/14/2021  ? stereo bx-calcs, "Ribbon" clip-path pending  ? breast biopsy Right 03/14/2021  ? [place] Bx, Axilla, path pending  ? BREAST BIOPSY Left 04/30/2021  ? BREAST BIOPSY WITH RADIO FREQUENCY LOCALIZER Right 09/09/2021  ? RF tag in axilla  ? BREAST RECONSTRUCTION WITH PLACEMENT OF TISSUE EXPANDER AND FLEX HD (ACELLULAR HYDRATED DERMIS) Right 09/16/2021  ? Procedure: RIGHT BREAST RECONSTRUCTION WITH PLACEMENT OF TISSUE EXPANDER AND FLEX HD (ACELLULAR HYDRATED DERMIS);  Surgeon: Cindra Presume, MD;  Location: [facility]  ORS;  Service: Plastics;  Laterality: Right;  ? BREAST SURGERY Left 10/16/2011  ? left breast finesse biopsy  ? COLONOSCOPY WITH PROPOFOL N/A 12/05/2014  ? Procedure: COLONOSCOPY WITH  PROPOFOL;  Surgeon: Christene Lye, MD;  Location: [facility] END[facility];  Service: Endoscopy;  Laterality: N/A;  ? DILATION AND CURETTAGE OF UTERUS    ? IR CV LINE INJECTION  05/06/2021  ? IR IMAGING GUIDED PORT INSERTION  05/08/2021  ? PORT-A-CATH REMOVAL  09/16/2021  ? Procedure: REMOVAL PORT-A-CATH;  Surgeon: Herbert Pun, MD;  Location: [facility] ORS;  Service: General;;  ? PORTACATH PLACEMENT N/A 04/02/2021  ? Procedure: INSERTION PORT-A-CATH;  Surgeon: Herbert Pun, MD;  Location: [facility] ORS;  Service: General;  Laterality: N/A;  ? SIMPLE MASTECTOMY WITH AXILLARY SENTINEL NODE BIOPSY Right 09/16/2021  ? Procedure: SIMPLE MASTECTOMY WITH AXILLARY SENTINEL NODE BIOPSY -- RF tag in axillary;  Surgeon: Herbert Pun, MD;  Location: [facility] ORS;  Service: General;  Laterality: Right;  ? ? ?FAMILY HISTORY: family history includes Bladder Cancer (age of onset: 19) in her father; Breast cancer (age of onset: 45) in her paternal aunt; Breast cancer (age of onset: 21) in her mother; Cancer in her cousin; Pancreatic cancer (age of onset: 34) in her paternal uncle; Pancreatic cancer (age of onset: 71) in her maternal grandfather. ? ?SOCIAL HISTORY:  reports that she has never smoked. She has never used smokeless tobacco. She reports that she does not drink alcohol and does not use drugs. ? ?ALLERGIES: Tegaderm ag mesh [silver] and Tape ? ?MEDICATIONS:  ?Current Outpatient Medications  ?Medication Sig Dispense Refill  ? acetaminophen (TYLENOL) 500 MG tablet Take 1,000 mg by mouth every 6 (six) hours as needed for moderate pain.    ? Calcium Carb-Cholecalciferol 500-10 MG-MCG TABS Take 1 tablet by mouth every morning.    ? ondansetron (ZOFRAN-ODT) 4 MG disintegrating tablet Take 1 tablet (4 mg total) by mouth every 8 (eight) hours as needed for nausea or vomiting. (Patient not taking: Reported on 09/26/2021) 20 tablet 0  ? sulfamethoxazole-trimethoprim (BACTRIM DS) 800-160 MG tablet Take 1 tablet by mouth 2  (two) times daily. COURSE WILL BE COMPLETED AT THE END OF THIS WEEK.    ? ?No current facility-administered medications for this encounter.  ? ? ?ECOG PERFORMANCE STATUS:  0 - Asymptomatic ? ?REVIEW OF SYSTEMS: ?Patient denies any weight loss, fatigue, weakness, fever, chills or night sweats. Patient denies any loss of vision, blurred vision. Patient denies any ringing  of the ears or hearing loss. No irregular heartbeat. Patient denies heart murmur or history of fainting. Patient denies any chest pain or pain radiating to her upper extremities. Patient denies any shortness of breath, difficulty breathing at night, cough or hemoptysis. Patient denies any swelling in the lower legs. Patient denies any nausea vomiting, vomiting of blood, or coffee ground material in the vomitus. Patient denies any stomach pain. Patient states has had normal bowel movements no significant constipation or diarrhea. Patient denies any dysuria, hematuria or significant nocturia. Patient denies any problems walking, swelling in the joints or loss of balance. Patient denies any skin changes, loss of hair or loss of weight. Patient denies any excessive worrying or anxiety or significant depression. Patient denies any problems with insomnia. Patient denies excessive thirst, polyuria, polydipsia. Patient denies any swollen glands, patient denies easy bruising or easy bleeding. Patient denies any recent infections, allergies or URI. Patient "s visual fields have not changed significantly in recent time. ?  ?PHYSICAL EXAM: ?BP 101/61  Wt 184 lb (83.5 kg)   BMI 30.62 kg/m?  ?Patient is status post right modified radical mastectomy with breast reconstruction drains are still present.  No dominant masses noted in the right chest wall left breast is free of dominant mass.  No axillary or supraclavicular adenopathy is appreciated.  Patient does have some minor lymphedema already present in her right upper extremity.  Well-developed well-nourished  patient in NAD. HEENT reveals PERLA, EOMI, discs not visualized.  Oral cavity is clear. No oral mucosal lesions are identified. Neck is clear without evidence of cervical or supraclavicular adenopathy. Lungs are c

## 2021-10-01 ENCOUNTER — Ambulatory Visit (INDEPENDENT_AMBULATORY_CARE_PROVIDER_SITE_OTHER): Payer: Managed Care, Other (non HMO) | Admitting: Physician Assistant

## 2021-10-01 DIAGNOSIS — Z9889 Other specified postprocedural states: Secondary | ICD-10-CM

## 2021-10-01 NOTE — Progress Notes (Signed)
Patient is a 56-year-old female with history of right-sided breast cancer s/p right-sided total mastectomy with immediate reconstruction using tissue expander and Flex HD performed 09/16/2021 by Dr. Pace and Dr. Cintron-Diaz. ?  ?At time of mastectomy, decision was made for complete axillary lymph node dissection.  Her Chemo-Port was also removed by general surgery.  Patient required multiple JP drains given lymph node dissection.  Expander was filled to a total of 300/475 cc. ? ?Patient was last seen here in the office on 09/24/2021.  At that time, she was no longer requiring any narcotic analgesics.  She had been monitoring her volume output from the drains and they are consistently downtrending.  However, decision was made to leave them in place for an additional week.  Exam was otherwise entirely reassuring.  She was following up with her oncologist later that week to determine whether or not postoperative RT would be required.  Plan was for continued activity modifications and compressive garments. ? ?Today, patient is accompanied by her husband at bedside.  She states that she has been doing well.  They have been keeping a log of the volume output from her drains.  Drain 1 and drain to have each been putting out less than 30 cc/day for the past 4 days.  She denies any change in the character of the drainage.  She only takes Tylenol intermittently for discomfort.  Otherwise, doing well.  She did state that she met with her oncologist who referred her to RT.  RT, Dr. Chrystal, is hoping that patient can start radiation in 4 to 6 weeks. ? ?Physical exam is reassuring.  Flaps without evidence of duskiness or necrosis.  No subcutaneous fluid collections noted.  No areas of erythema.  JP drains were intact and functional.  Normal-appearing drainage in bulbs. ? ?JP drains removed without complication or difficulty.   ? ?We placed injectable saline in the Expander using a sterile technique: ?Right: 70 cc for a total of  370 / 475 cc ? ?Patient to return in 7 to 10 days for repeat expander fill.  Suspect that she will require delayed implant exchange given need for RT in 4 to 6 weeks.  She will call clinic should she have any questions or concerns in interim. ?

## 2021-10-09 ENCOUNTER — Ambulatory Visit (INDEPENDENT_AMBULATORY_CARE_PROVIDER_SITE_OTHER): Payer: Managed Care, Other (non HMO) | Admitting: Surgical

## 2021-10-09 DIAGNOSIS — C50911 Malignant neoplasm of unspecified site of right female breast: Secondary | ICD-10-CM

## 2021-10-09 DIAGNOSIS — Z170 Estrogen receptor positive status [ER+]: Secondary | ICD-10-CM

## 2021-10-09 DIAGNOSIS — Z9889 Other specified postprocedural states: Secondary | ICD-10-CM

## 2021-10-09 MED ORDER — SULFAMETHOXAZOLE-TRIMETHOPRIM 800-160 MG PO TABS: 1.0000 | ORAL_TABLET | Freq: Two times a day (BID) | ORAL | 0 refills | Status: AC

## 2021-10-09 NOTE — Progress Notes (Addendum)
56 year old female here for follow-up on her right breast reconstruction.  Underwent mastectomy and tissue expander placement on 09/16/2021 by Dr. [name] and Dr. [name].  She is 3 weeks postop.  Per previous visit, patient is going to have radiation that would start in approximately 4 weeks, reports she discussed this with radiation oncologist and they are going to notify radiation oncologist once we are close to our final expander fills.  She had her JP drain removed at her last appointment.  She reports that she has noticed redness of her left port incision site, reports that Dr. [name] removed this at the time of her mastectomy.  She reports that she has been applying steroid cream to the area.  She has noticed some drainage when showering with warm water.  She is not having any infectious symptoms.  She has not reached out to general surgery to inform them.  Chaperone present on exam On exam right breast incision is intact, JP drain insertion sites are healing well, no subcutaneous fluid collection noted.  No erythremia.  On exam of the left chest wall port site, area of induration is noted that is approximately 4 x 4 cm.  No fluctuance is noted.  No drainage is specifically noted from the incision, however she reports some drainage with warm showers.    We placed injectable saline in the Expander using a sterile technique: Right: 60 cc for a total of 430 / 16 cc  56 year old female status post right mastectomy with expander in place, suspect she will need 1-2 more fills.  She is unsure if she would like to have a left breast mastopexy/reduction for symmetry.  She was not aware that this was possible, we did discuss that this was an option and briefly discussed surgical technique and possibilities.   We also discussed that the erythema and induration of the left is concerning for infection, will prescribe antibiotics, recommend seeing general surgery for evaluation of this within the  next week.  Provided patient with instructions on calling if her symptoms worsen or change.  I do not appreciate any fluid collections on exam that were drainable.  We discussed that once she has her CT radiation mapping, we would no longer be able to do any expansions.  She can notify radiation oncology that her last fill will likely be in 2 weeks.  Pictures were obtained of the patient and placed in the chart with the patient's or guardian's permission.

## 2021-10-09 NOTE — Addendum Note (Signed)
Addended byRoetta Sessions on: 10/09/2021 09:10 AM   Modules accepted: Orders

## 2021-10-13 ENCOUNTER — Telehealth: Payer: Self-pay | Admitting: *Deleted

## 2021-10-13 ENCOUNTER — Encounter: Payer: Self-pay | Admitting: Occupational Therapy

## 2021-10-13 ENCOUNTER — Ambulatory Visit: Payer: Managed Care, Other (non HMO) | Attending: Oncology | Admitting: Occupational Therapy

## 2021-10-13 DIAGNOSIS — I972 Postmastectomy lymphedema syndrome: Secondary | ICD-10-CM | POA: Diagnosis present

## 2021-10-13 DIAGNOSIS — M6281 Muscle weakness (generalized): Secondary | ICD-10-CM

## 2021-10-13 DIAGNOSIS — M25611 Stiffness of right shoulder, not elsewhere classified: Secondary | ICD-10-CM

## 2021-10-13 DIAGNOSIS — L905 Scar conditions and fibrosis of skin: Secondary | ICD-10-CM | POA: Diagnosis present

## 2021-10-13 NOTE — Telephone Encounter (Signed)
Emma Cuevas sent secure chat today for getting day time sleeve and gauntlet 20-30 mm Hg and also tubrigrip at night time and it was sen to [facility]. Faxed transmission complete

## 2021-10-13 NOTE — Therapy (Signed)
[facility] [address], [place], [address] Phone: [phone]   Fax:  [phone]  Occupational Therapy Evaluation  Patient Details  Name: Emma Cuevas MRN: [id number] Date of Birth: [birth year] Referring Provider (OT): Adela Glimpse Date: 10/13/2021   OT End of Session - 10/13/21 1409     Visit Number 1    Number of Visits 12    Date for OT Re-Evaluation 11/24/21    OT Start Time 1030    OT Stop Time 1120    OT Time Calculation (min) 50 min    Activity Tolerance Patient tolerated treatment well    Behavior During Therapy [facility] for tasks assessed/performed             Past Medical History:  Diagnosis Date   Anemia    Benign neoplasm of breast 2013   left breast   BRCA negative 2015   Breast cancer ([place])    Cancer ([place])    Family history of bladder cancer    Family history of breast cancer    Family history of malignant neoplasm of breast 2013   Family history of pancreatic cancer    Screening for obesity     Past Surgical History:  Procedure Laterality Date   ABDOMINAL HYSTERECTOMY  2011   partial   BREAST BIOPSY Left 2013   BREAST BIOPSY Right 03/14/2021   Stereo bx-anterior calcs, "coil" clip-path pending   BREAST BIOPSY Right 03/14/2021   stereo bx-calcs, "Ribbon" clip-path pending   breast biopsy Right 03/14/2021   [place] Bx, Axilla, path pending   BREAST BIOPSY Left 04/30/2021   BREAST BIOPSY WITH RADIO FREQUENCY LOCALIZER Right 09/09/2021   RF tag in axilla   BREAST RECONSTRUCTION WITH PLACEMENT OF TISSUE EXPANDER AND FLEX HD (ACELLULAR HYDRATED DERMIS) Right 09/16/2021   Procedure: RIGHT BREAST RECONSTRUCTION WITH PLACEMENT OF TISSUE EXPANDER AND FLEX HD (ACELLULAR HYDRATED DERMIS);  Surgeon: Cindra Presume, MD;  Location: [facility] ORS;  Service: Plastics;  Laterality: Right;   BREAST SURGERY Left 10/16/2011   left breast finesse biopsy   COLONOSCOPY WITH PROPOFOL N/A 12/05/2014    Procedure: COLONOSCOPY WITH PROPOFOL;  Surgeon: Christene Lye, MD;  Location: [facility] ENDOSCOPY;  Service: Endoscopy;  Laterality: N/A;   DILATION AND CURETTAGE OF UTERUS     IR CV LINE INJECTION  05/06/2021   IR IMAGING GUIDED PORT INSERTION  05/08/2021   PORT-A-CATH REMOVAL  09/16/2021   Procedure: REMOVAL PORT-A-CATH;  Surgeon: Herbert Pun, MD;  Location: [facility] ORS;  Service: General;;   PORTACATH PLACEMENT N/A 04/02/2021   Procedure: INSERTION PORT-A-CATH;  Surgeon: Herbert Pun, MD;  Location: [facility] ORS;  Service: General;  Laterality: N/A;   SIMPLE MASTECTOMY WITH AXILLARY SENTINEL NODE BIOPSY Right 09/16/2021   Procedure: SIMPLE MASTECTOMY WITH AXILLARY SENTINEL NODE BIOPSY -- RF tag in axillary;  Surgeon: Herbert Pun, MD;  Location: [facility] ORS;  Service: General;  Laterality: Right;    There were no vitals filed for this visit.   Subjective Assessment - 10/13/21 1356     Subjective  I had swelling immediately after my first surgery in my right upper arm to his my elbow is slowly coming down I think.  I am not doing an overhead range of motion they did not tell me yet to do with it is healing well.  I am supposed to go for my radiation markers June 15 around and then after that they will do the expanders  out.  Supposed to go back to work Wednesday mostly work on the computer at home for [employer].    Pertinent History Patient is a 56 year old female with history of right-sided breast cancer s/p right-sided total mastectomy with immediate reconstruction using tissue expander and Flex HD performed 09/16/2021 by Dr. [name] and Dr. [name].     At time of mastectomy, decision was made for complete axillary lymph node dissection.  Her Chemo-Port was also removed by general surgery.  Patient required multiple JP drains given lymph node dissection.  Expander was filled to a total of 300/475 cc.     Patient was last seen here in the office on 09/24/2021.  At that time,  she was no longer requiring any narcotic analgesics..   Plan was for continued activity modifications and compressive garments.  Per pt had 2 fillings in expander since then - maybe getting one more Wed - returning to work. Had chemo prrior to surgery and having Radiation simulation about june 15th - and then after that having expanders out and replace by implants. Refer to OT for education for lymphedema , pt had increase swelling since after surgery per pt in R upper arm. And limited R shoulder AROM over head - did get order from surgery PA Krista Blue for overhead ROM .    Patient Stated Goals I do not want my right arm to get bigger or have lymphedema and if I can get my motion better that I can use it overhead and I need to be able to get into radiation position.    Currently in Pain? Yes    Pain Score 4     Pain Location Axilla    Pain Orientation Right    Pain Descriptors / Indicators Aching;Tender;Tightness    Pain Type Surgical pain    Pain Onset 1 to 4 weeks ago    Pain Frequency Intermittent    Aggravating Factors  Overhead shoulder range of motion               [facility] OT Assessment - 10/13/21 0001       Assessment   Medical Diagnosis Right mastectomy with expanders and reconstruction, axillary lymph node dissection and stiffness in right shoulder    Referring Provider (OT) Janese Banks    Onset Date/Surgical Date 09/16/21    Next MD Visit Wednesday 24th of my      [place] Full time employment    Leisure Going back to work Wednesday [employer] from home on computer, like yard work, walking a little bit      AROM   Right Shoulder Extension 70 Degrees    Right Shoulder Flexion 6 Degrees    Right Shoulder ABduction 80 Degrees              LYMPHEDEMA/ONCOLOGY QUESTIONNAIRE - 10/13/21 0001       Type   Cancer Type Right breast cancer      What other symptoms do you have   Are you Having Heaviness or Tightness  Yes    Are you having Pain No    Do you have infections Yes    Comments Apply Chemo-Port on antibiotic at the moment    Is there Decreased scar mobility No      Right Upper Extremity Lymphedema   15 cm Proximal to Olecranon Process 37 cm    10 cm Proximal to Olecranon Process 35.7  cm    Olecranon Process 28.5 cm    15 cm Proximal to Ulnar Styloid Process 27 cm    10 cm Proximal to Ulnar Styloid Process 23.4 cm    Just Proximal to Ulnar Styloid Process 17 cm    Across Hand at [employer] 19 cm      Left Upper Extremity Lymphedema   15 cm Proximal to Olecranon Process 34.4 cm    10 cm Proximal to Olecranon Process 33.3 cm    Olecranon Process 27 cm    15 cm Proximal to Ulnar Styloid Process 25.7 cm    10 cm Proximal to Ulnar Styloid Process 23 cm    Just Proximal to Ulnar Styloid Process 17.4 cm    Across Hand at [employer] 18.5 cm    At [place] of 2nd Digit 6.1 cm                   Patient was educated on lymphedema signs and symptoms and precautions as well as management handout provided and reviewed with patient.  Patient right upper extremity is increased 2.6 cm, elbow 1.5 and forearm 1.3 cm compared to the left.  Patient was fitted with a Isotoner glove with a Tubigrip D for hand to forearm and Tubigrip E for mid forearm to upper arm to wear as much as she can in the next 24 hours prior to getting measured for a daytime compression sleeve and gauntlet.  Patient to get to [facility] to get fitted with a over-the-counter compression sleeve and gauntlet tomorrow and wear that is during the day and Tubigrip at nighttime until follow-up appointment with me on Friday.     Patient was also educated on active assisted range of motion using golf club in supine for shoulder flexion as well as abduction did get a order from PA [place] from plastic surgery for overhead range of motion.  Patient to keep pain at a slight pull and less than a 2 out of 10 discomfort.  To do 2  times a day.        OT Education - 10/13/21 1408     Education Details Findings of evaluation and plan of care as well as education on symptoms and signs of lymphedema prevention    Person(s) Educated Patient    Methods Explanation;Demonstration;Tactile cues;Verbal cues;Handout    Comprehension Verbal cues required;Returned demonstration;Verbalized understanding                 OT Long Term Goals - 10/13/21 1425       OT LONG TERM GOAL #1   Title Patient circumference in right upper extremity decreased to close to 1 cm difference compared to the left to be fitted with a compression sleeve for daytime during high risk activity.  To maintain circumference    Baseline Right upper arm increased 2.6 cm compared to the left my elbow 1.5 cm and proximal forearm 1.3 cm increased compared to the left-patient is right-handed but more increased than expected.  No compression at this time    Time 3    Period Weeks    Status New    Target Date 11/03/21      OT LONG TERM GOAL #2   Title Right shoulder active range of motion increase overhead to within normal limits for patient to reach into cabinets and overhead without increased symptoms    Baseline Patient shoulder flexion 96 degrees and abduction 80 degrees with a pull and discomfort  in axilla and lateral breast.  Patient has not been doing any motion overhead since now and is patient is tomorrow 4 weeks postop patient is right-hand dominant    Time 6    Period Weeks    Status New    Target Date 11/24/21      OT LONG TERM GOAL #3   Title Patient is a right upper extremity active range of motion and strength improved to within normal limits for patient to pull overhead sweater out as well as takedown more than 4 pounds from top shelf and open and close heavy doors and refrigerator.    Baseline Patient 4 weeks postop 96 flexion and abduction 80 degrees of right shoulder motion with a pull and discomfort in axilla and lateral breast  unable to overhead do range of motion not using hand and activities except for light ADLs.    Time 6    Period Weeks    Status New    Target Date 11/24/21                   Plan - 10/13/21 1410     Clinical Impression Statement Patient presented OT evaluation about 4 weeks post right mastectomy with axillary lymph node dissection, expanders put in at the same time of mastectomy and had per patient 2 feelings since then.  Patient Chemo-Port was removed and at the moment for infection on antibiotic.  Patient referred for lymphedema education for signs and symptoms as well as management.  Patient report at time after for surgery patient had swelling in the right upper arm.  Patient present with increase of 2.6 cm circumference on the right upper arm 1.5 at elbow and forearm 1.3 compared to the left upper extremity.  Patient still wearing her compression bra postsurgery.  Patient shows stiffness in right shoulder flexion and abduction overhead.  Did get a order from plastic surgery PA Donna Christen for overhead range of motion.  Patient was educated on a home program for shoulder flexion and abduction active assisted range of motion as well as temporary compression for nighttime using Tubigrip D and E.  Will get a order sent to [facility] for patient to pick up over-the-counter compression sleeve and gauntlet to use during the day with Tubigrip at nighttime to decrease lymphedema signs.  It appears patient can be stage I to stage II lymphedema will reassess end of week circumference after wearing compression for the week.  Patient can benefit from OT services to decrease lymphedema and right upper extremity and setting her up with a home program to manage her lymphedema and fitting of compression as well as increased range of motion and strength for overhead activities with the right dominant hand.  Patient is supposed to return back to work using a computer on Wednesday working from home.    OT  Occupational Profile and History Problem Focused Assessment - Including review of records relating to presenting problem    Occupational performance deficits (Please refer to evaluation for details): ADL's;IADL's;Work;Play;Leisure;Social Participation    Body Structure / Function / Physical Skills Decreased knowledge of precautions;Flexibility;Scar mobility;ROM;IADL;Edema;UE functional use;Pain;Strength    Rehab Potential Good    Clinical Decision Making Several treatment options, min-mod task modification necessary    Comorbidities Affecting Occupational Performance: None    Modification or Assistance to Complete Evaluation  Min-Moderate modification of tasks or assist with assess necessary to complete eval    OT Frequency 2x / week    OT Duration 6  weeks    OT Treatment/Interventions Self-care/ADL training;Manual Therapy;Patient/family education;Passive range of motion;Therapeutic exercise;Scar mobilization;Manual lymph drainage    Consulted and Agree with Plan of Care Patient             Patient will benefit from skilled therapeutic intervention in order to improve the following deficits and impairments:   Body Structure / Function / Physical Skills: Decreased knowledge of precautions, Flexibility, Scar mobility, ROM, IADL, Edema, UE functional use, Pain, Strength       Visit Diagnosis: Postmastectomy lymphedema syndrome - Plan: Ot plan of care cert/re-cert  Stiffness of right shoulder, not elsewhere classified - Plan: Ot plan of care cert/re-cert  Muscle weakness (generalized) - Plan: Ot plan of care cert/re-cert  Scar tissue - Plan: Ot plan of care cert/re-cert    Problem List Patient Active Problem List   Diagnosis Date Noted   Breast cancer ([place]) 09/16/2021   Genetic testing 05/06/2021   Family history of pancreatic cancer 04/23/2021   Family history of bladder cancer 04/23/2021   Carcinoma of breast, estrogen and progesterone receptor positive ([place]) 03/25/2021    Goals of care, counseling/discussion 03/25/2021   Menopause 10/05/2016   Mild renal insufficiency 10/05/2016   Benign neoplasm of breast 10/27/2012   Family history of breast cancer 10/27/2012    Rosalyn Gess, OTR/L,CLT 10/13/2021, 2:31 PM  [facility] [address], [place], [address] Phone: [phone]   Fax:  [phone]  Name: ADAMARIE IZZO MRN: [id number] Date of Birth: Dec 02, 1965

## 2021-10-15 ENCOUNTER — Ambulatory Visit (INDEPENDENT_AMBULATORY_CARE_PROVIDER_SITE_OTHER): Payer: Managed Care, Other (non HMO) | Admitting: Surgical

## 2021-10-15 ENCOUNTER — Encounter: Payer: Self-pay | Admitting: Surgical

## 2021-10-15 DIAGNOSIS — Z9889 Other specified postprocedural states: Secondary | ICD-10-CM

## 2021-10-15 NOTE — Progress Notes (Signed)
Patient is a 56 year old female here for follow-up on her right breast reconstruction.  At her last appointment she did have some erythema of the left chest wall port site, reports she saw general surgery same day and had the area drained.  She reports since then she has had continuous drainage but it is improving.  She reports her last dosing of antibiotics is today.  She reports she discussed radiation plan with radiation oncologist and they are planning for CT radiation mapping June 15.  We will plan for last expansion fill on 10/22/2021.  She reports that she would like an additional expansion today.  She reports that she is 99% sure that she would like to have the left breast lifted/reduced for symmetry at the exchange of the expander to the implant.  Chaperone present on exam On exam right breast incision is intact, healing well, blue dye is noted.  There is no erythema or cellulitic changes.  No subcutaneous fluid collections noted palpation.  Left chest wall port site significantly improved, no cellulitic changes noted.  She does have a small opening that is approximately 1 x 1 mm, scant amount of purulence is expressed with palpation.  No foul odor noted.  We placed injectable saline in the Expander using a sterile technique: Right: 50 cc for a total of 480 / 475 cc  We will plan for possibly 1 additional fill in 1 week, patient has appointment scheduled with Dr. [name].  She will then have radiation starting November 06, 2021.  Pictures were obtained of the patient and placed in the chart with the patient's or guardian's permission.

## 2021-10-16 ENCOUNTER — Ambulatory Visit
Admission: RE | Admit: 2021-10-16 | Discharge: 2021-10-16 | Disposition: A | Discharge status: Home / Self Care | Payer: Managed Care, Other (non HMO) | Source: Ambulatory Visit | Attending: Oncology | Admitting: Oncology

## 2021-10-16 DIAGNOSIS — Z170 Estrogen receptor positive status [ER+]: Secondary | ICD-10-CM | POA: Diagnosis present

## 2021-10-16 DIAGNOSIS — C50911 Malignant neoplasm of unspecified site of right female breast: Secondary | ICD-10-CM | POA: Diagnosis present

## 2021-10-17 ENCOUNTER — Telehealth: Payer: Self-pay | Admitting: Plastic Surgery

## 2021-10-17 ENCOUNTER — Ambulatory Visit: Payer: Managed Care, Other (non HMO) | Admitting: Occupational Therapy

## 2021-10-17 DIAGNOSIS — M25611 Stiffness of right shoulder, not elsewhere classified: Secondary | ICD-10-CM

## 2021-10-17 DIAGNOSIS — I972 Postmastectomy lymphedema syndrome: Secondary | ICD-10-CM

## 2021-10-17 DIAGNOSIS — L905 Scar conditions and fibrosis of skin: Secondary | ICD-10-CM

## 2021-10-17 DIAGNOSIS — M6281 Muscle weakness (generalized): Secondary | ICD-10-CM

## 2021-10-17 NOTE — Therapy (Signed)
[facility] [address], [place], [address] Phone: [phone]   Fax:  [phone]  Occupational Therapy Treatment  Patient Details  Name: Emma Cuevas MRN: [id number] Date of Birth: 01/16/1966 Referring Provider (OT): Adela Glimpse Date: 10/17/2021   OT End of Session - 10/17/21 1338     Visit Number 2    Number of Visits 12    Date for OT Re-Evaluation 11/24/21    OT Start Time 1132    OT Stop Time 1211    OT Time Calculation (min) 39 min    Activity Tolerance Patient tolerated treatment well    Behavior During Therapy [facility] for tasks assessed/performed             Past Medical History:  Diagnosis Date   Anemia    Benign neoplasm of breast 2013   left breast   BRCA negative 2015   Breast cancer ([place])    Cancer ([place])    Family history of bladder cancer    Family history of breast cancer    Family history of malignant neoplasm of breast 2013   Family history of pancreatic cancer    Screening for obesity     Past Surgical History:  Procedure Laterality Date   ABDOMINAL HYSTERECTOMY  2011   partial   BREAST BIOPSY Left 2013   BREAST BIOPSY Right 03/14/2021   Stereo bx-anterior calcs, "coil" clip-path pending   BREAST BIOPSY Right 03/14/2021   stereo bx-calcs, "Ribbon" clip-path pending   breast biopsy Right 03/14/2021   [place] Bx, Axilla, path pending   BREAST BIOPSY Left 04/30/2021   BREAST BIOPSY WITH RADIO FREQUENCY LOCALIZER Right 09/09/2021   RF tag in axilla   BREAST RECONSTRUCTION WITH PLACEMENT OF TISSUE EXPANDER AND FLEX HD (ACELLULAR HYDRATED DERMIS) Right 09/16/2021   Procedure: RIGHT BREAST RECONSTRUCTION WITH PLACEMENT OF TISSUE EXPANDER AND FLEX HD (ACELLULAR HYDRATED DERMIS);  Surgeon: Cindra Presume, MD;  Location: [facility];  Service: Plastics;  Laterality: Right;   BREAST SURGERY Left 10/16/2011   left breast finesse biopsy   COLONOSCOPY WITH PROPOFOL N/A 12/05/2014    Procedure: COLONOSCOPY WITH PROPOFOL;  Surgeon: Christene Lye, MD;  Location: [facility];  Service: Endoscopy;  Laterality: N/A;   DILATION AND CURETTAGE OF UTERUS     IR CV LINE INJECTION  05/06/2021   IR IMAGING GUIDED PORT INSERTION  05/08/2021   PORT-A-CATH REMOVAL  09/16/2021   Procedure: REMOVAL PORT-A-CATH;  Surgeon: Herbert Pun, MD;  Location: [facility];  Service: General;;   PORTACATH PLACEMENT N/A 04/02/2021   Procedure: INSERTION PORT-A-CATH;  Surgeon: Herbert Pun, MD;  Location: [facility];  Service: General;  Laterality: N/A;   SIMPLE MASTECTOMY WITH AXILLARY SENTINEL NODE BIOPSY Right 09/16/2021   Procedure: SIMPLE MASTECTOMY WITH AXILLARY SENTINEL NODE BIOPSY -- RF tag in axillary;  Surgeon: Herbert Pun, MD;  Location: [facility];  Service: General;  Laterality: Right;    There were no vitals filed for this visit.   Subjective Assessment - 10/17/21 1210     Subjective  Doing okay I had this past Wednesday at another filling.  I am seeing Dr. [name] next Wednesday for possible filling.  Did do my exercises and also got my compression sleeve.    Pertinent History Patient is a 56 year old female with history of right-sided breast cancer s/p right-sided total mastectomy with immediate reconstruction using tissue expander and Flex HD performed 09/16/2021 by Dr. [name] and Dr.  Cintron-Diaz.     At time of mastectomy, decision was made for complete axillary lymph node dissection.  Her Chemo-Port was also removed by general surgery.  Patient required multiple JP drains given lymph node dissection.  Expander was filled to a total of 300/475 cc.     Patient was last seen here in the office on 09/24/2021.  At that time, she was no longer requiring any narcotic analgesics..   Plan was for continued activity modifications and compressive garments.  Per pt had 2 fillings in expander since then - maybe getting one more Wed - returning to work. Had chemo prrior to  surgery and having Radiation simulation about june 15th - and then after that having expanders out and replace by implants. Refer to OT for education for lymphedema , pt had increase swelling since after surgery per pt in R upper arm. And limited R shoulder AROM over head - did get order from surgery PA Krista Blue for overhead ROM .    Patient Stated Goals I do not want my right arm to get bigger or have lymphedema and if I can get my motion better that I can use it overhead and I need to be able to get into radiation position.    Currently in Pain? No/denies                [facility] OT Assessment - 10/17/21 0001       AROM   Right Shoulder Extension 70 Degrees    Right Shoulder Flexion 110 Degrees   125 in session   Right Shoulder ABduction 95 Degrees   110 in session            LYMPHEDEMA/ONCOLOGY QUESTIONNAIRE - 10/17/21 0001       Right Upper Extremity Lymphedema   15 cm Proximal to Olecranon Process 36 cm    10 cm Proximal to Olecranon Process 35 cm    Olecranon Process 27.5 cm    15 cm Proximal to Ulnar Styloid Process 26.6 cm    10 cm Proximal to Ulnar Styloid Process 23 cm    Just Proximal to Ulnar Styloid Process 16.2 cm    Across Hand at [employer] 18.5 cm                   Patient was educated on lymphedema signs and symptoms and precautions as well as management with evaluation earlier this week.  Handout provided and reviewed with patient.  Patient right upper extremity was increased 2.6 cm, elbow 1.5 and forearm 1.3 cm compared to the left.   Patient returned this date with her over-the-counter compression sleeve that she was wearing during the day since have seen her last time.  Patient still await gauntlet. Patient was fitted with a Isotoner glove with a Tubigrip D for hand to forearm and Tubigrip E for mid forearm to upper arm to wear at nighttime.         Patient show increase right shoulder flexion and abduction compared to evaluation earlier  this year Reviewed with patient again active assisted range of motion using golf club in supine for shoulder flexion as well as abduction -needed min assist for review of abduction.   Did get a order from [place] from plastic surgery for overhead range of motion on evaluation today. Done and added new exercises to patient's home program for shoulder flexion and abduction sitting at her desk doing active assisted range of motion 10 reps hold 3  to 5 seconds.   Patient to keep pain at a slight pull and less than a 2 out of 10 discomfort.                      OT Education - 10/17/21 1338     Education Details Progress and changes to home exercises    Person(s) Educated Patient    Methods Explanation;Demonstration;Tactile cues;Verbal cues;Handout    Comprehension Verbal cues required;Returned demonstration;Verbalized understanding                 OT Long Term Goals - 10/13/21 1425       OT LONG TERM GOAL #1   Title Patient circumference in right upper extremity decreased to close to 1 cm difference compared to the left to be fitted with a compression sleeve for daytime during high risk activity.  To maintain circumference    Baseline Right upper arm increased 2.6 cm compared to the left my elbow 1.5 cm and proximal forearm 1.3 cm increased compared to the left-patient is right-handed but more increased than expected.  No compression at this time    Time 3    Period Weeks    Status New    Target Date 11/03/21      OT LONG TERM GOAL #2   Title Right shoulder active range of motion increase overhead to within normal limits for patient to reach into cabinets and overhead without increased symptoms    Baseline Patient shoulder flexion 96 degrees and abduction 80 degrees with a pull and discomfort in axilla and lateral breast.  Patient has not been doing any motion overhead since now and is patient is tomorrow 4 weeks postop patient is right-hand dominant    Time 6     Period Weeks    Status New    Target Date 11/24/21      OT LONG TERM GOAL #3   Title Patient is a right upper extremity active range of motion and strength improved to within normal limits for patient to pull overhead sweater out as well as takedown more than 4 pounds from top shelf and open and close heavy doors and refrigerator.    Baseline Patient 4 weeks postop 96 flexion and abduction 80 degrees of right shoulder motion with a pull and discomfort in axilla and lateral breast unable to overhead do range of motion not using hand and activities except for light ADLs.    Time 6    Period Weeks    Status New    Target Date 11/24/21                   Plan - 10/17/21 1338     Clinical Impression Statement Patient presented OT evaluation about 4 weeks post right mastectomy with axillary lymph node dissection, expanders put in at the same time of mastectomy and had per patient 2 feelings since then.  Patient Chemo-Port was removed and at the moment for infection on antibiotic.  Patient referred for lymphedema education for signs and symptoms as well as management.  Patient report at time after for surgery patient had swelling in the right upper arm.  Patient present with increase of 2.6 cm circumference on the right upper arm 1.5 at elbow and forearm 1.3 compared to the left upper extremity.  Patient still wearing her compression bra postsurgery.  Patient shows stiffness in right shoulder flexion and abduction overhead.  Did get a order from plastic surgery PA Donna Christen for  overhead range of motion at evaluation date.  Patient returned this date for first follow-up.  Patient show increased right shoulder range of motion doing home exercises.  She also picked up her over-the-counter compression sleeve that she has been wearing during the day and at nighttime Tubigrip D and E with Isotoner glove.  Patient shows decrease circumference in right upper extremity from wrist to upper arm.   Patient can  benefit from OT services to decrease lymphedema in  right upper extremity and setting her up with a home program to manage her lymphedema as well as increased range of motion and strength for overhead activities with the right dominant hand.  Patient returned this past Wednesday back to work working mostly from home.    OT Occupational Profile and History Problem Focused Assessment - Including review of records relating to presenting problem    Occupational performance deficits (Please refer to evaluation for details): ADL's;IADL's;Work;Play;Leisure;Social Participation    Body Structure / Function / Physical Skills Decreased knowledge of precautions;Flexibility;Scar mobility;ROM;IADL;Edema;UE functional use;Pain;Strength    Rehab Potential Good    Clinical Decision Making Several treatment options, min-mod task modification necessary    Comorbidities Affecting Occupational Performance: None    Modification or Assistance to Complete Evaluation  Min-Moderate modification of tasks or assist with assess necessary to complete eval    OT Frequency 2x / week    OT Duration 6 weeks    OT Treatment/Interventions Self-care/ADL training;Manual Therapy;Patient/family education;Passive range of motion;Therapeutic exercise;Scar mobilization;Manual lymph drainage    Consulted and Agree with Plan of Care Patient             Patient will benefit from skilled therapeutic intervention in order to improve the following deficits and impairments:   Body Structure / Function / Physical Skills: Decreased knowledge of precautions, Flexibility, Scar mobility, ROM, IADL, Edema, UE functional use, Pain, Strength       Visit Diagnosis: Postmastectomy lymphedema syndrome  Stiffness of right shoulder, not elsewhere classified  Muscle weakness (generalized)  Scar tissue    Problem List Patient Active Problem List   Diagnosis Date Noted   Breast cancer ([place]) 09/16/2021   Genetic testing 05/06/2021   Family  history of pancreatic cancer 04/23/2021   Family history of bladder cancer 04/23/2021   Carcinoma of breast, estrogen and progesterone receptor positive ([place]) 03/25/2021   Goals of care, counseling/discussion 03/25/2021   Menopause 10/05/2016   Mild renal insufficiency 10/05/2016   Benign neoplasm of breast 10/27/2012   Family history of breast cancer 10/27/2012    Rosalyn Gess, OTR/L,CLT 10/17/2021, 1:44 PM  [facility] [address] [place], [address] Phone: [phone]   Fax:  [phone]  Name: Emma Cuevas MRN: [id number] Date of Birth: [birth year]

## 2021-10-17 NOTE — Telephone Encounter (Signed)
TISSUE EXPANDER PLACEMENT BREAST RECONSTRUCTION/ IMPLNT BIO IMPLNT FOR SOFT TISSUE REINFORCEMENT- Outpatient Date: 08/04/2021 [employer]~ [phone]~ telephone/ [phone]~ fax CPT 19357/ 15777 Dx: C50.911/ Z17.0 Representative: Joy R. PA submitted.  Authorization: [id number] 08/01/2021- 01/28/2022 [employer], LPN/ [employer]       Note

## 2021-10-22 ENCOUNTER — Encounter: Payer: Self-pay | Admitting: Plastic Surgery

## 2021-10-22 ENCOUNTER — Ambulatory Visit: Payer: Managed Care, Other (non HMO) | Admitting: Surgical

## 2021-10-22 ENCOUNTER — Ambulatory Visit (INDEPENDENT_AMBULATORY_CARE_PROVIDER_SITE_OTHER): Payer: Managed Care, Other (non HMO) | Admitting: Plastic Surgery

## 2021-10-22 DIAGNOSIS — Z9889 Other specified postprocedural states: Secondary | ICD-10-CM

## 2021-10-22 NOTE — Progress Notes (Signed)
Patient presents after right breast reconstruction with tissue expander and acellular dermal matrix.  She is overall very happy with how things have gone.  She is satisfied with her current size and is not looking to increase the right any further.  She feels that her left side is still slightly larger than the right and volume is centered a bit lower.  On exam everything looks to be healing fine.  She has radiation scheduled to start in the next few weeks.  We will plan to see her back a few months out after radiation is completed and we will then plan a right-sided implant exchange and a left-sided reduction for symmetry.  All of her questions were answered.

## 2021-10-22 NOTE — Telephone Encounter (Signed)
Encounter opened in error

## 2021-10-24 ENCOUNTER — Ambulatory Visit: Payer: Managed Care, Other (non HMO) | Attending: Oncology | Admitting: Occupational Therapy

## 2021-10-24 DIAGNOSIS — I972 Postmastectomy lymphedema syndrome: Secondary | ICD-10-CM | POA: Diagnosis present

## 2021-10-24 DIAGNOSIS — M25611 Stiffness of right shoulder, not elsewhere classified: Secondary | ICD-10-CM | POA: Insufficient documentation

## 2021-10-24 DIAGNOSIS — L905 Scar conditions and fibrosis of skin: Secondary | ICD-10-CM | POA: Diagnosis present

## 2021-10-24 DIAGNOSIS — M6281 Muscle weakness (generalized): Secondary | ICD-10-CM | POA: Diagnosis present

## 2021-10-24 NOTE — Therapy (Signed)
[facility] [address], [place], [address] Phone: [phone]   Fax:  [phone]  Occupational Therapy Treatment  Patient Details  Name: Emma Cuevas MRN: [id number] Date of Birth: 1965/11/09 Referring Provider (OT): Adela Glimpse Date: 10/24/2021   OT End of Session - 10/24/21 1128     Visit Number 3    Number of Visits 12    Date for OT Re-Evaluation 11/24/21    OT Start Time 8676    OT Stop Time 1120    OT Time Calculation (min) 28 min    Activity Tolerance Patient tolerated treatment well    Behavior During Therapy [facility] for tasks assessed/performed             Past Medical History:  Diagnosis Date   Anemia    Benign neoplasm of breast 2013   left breast   BRCA negative 2015   Breast cancer ([place])    Cancer ([place])    Family history of bladder cancer    Family history of breast cancer    Family history of malignant neoplasm of breast 2013   Family history of pancreatic cancer    Screening for obesity     Past Surgical History:  Procedure Laterality Date   ABDOMINAL HYSTERECTOMY  2011   partial   BREAST BIOPSY Left 2013   BREAST BIOPSY Right 03/14/2021   Stereo bx-anterior calcs, "coil" clip-path pending   BREAST BIOPSY Right 03/14/2021   stereo bx-calcs, "Ribbon" clip-path pending   breast biopsy Right 03/14/2021   [place] Bx, Axilla, path pending   BREAST BIOPSY Left 04/30/2021   BREAST BIOPSY WITH RADIO FREQUENCY LOCALIZER Right 09/09/2021   RF tag in axilla   BREAST RECONSTRUCTION WITH PLACEMENT OF TISSUE EXPANDER AND FLEX HD (ACELLULAR HYDRATED DERMIS) Right 09/16/2021   Procedure: RIGHT BREAST RECONSTRUCTION WITH PLACEMENT OF TISSUE EXPANDER AND FLEX HD (ACELLULAR HYDRATED DERMIS);  Surgeon: Cindra Presume, MD;  Location: [facility];  Service: Plastics;  Laterality: Right;   BREAST SURGERY Left 10/16/2011   left breast finesse biopsy   COLONOSCOPY WITH PROPOFOL N/A 12/05/2014    Procedure: COLONOSCOPY WITH PROPOFOL;  Surgeon: Christene Lye, MD;  Location: [facility];  Service: Endoscopy;  Laterality: N/A;   DILATION AND CURETTAGE OF UTERUS     IR CV LINE INJECTION  05/06/2021   IR IMAGING GUIDED PORT INSERTION  05/08/2021   PORT-A-CATH REMOVAL  09/16/2021   Procedure: REMOVAL PORT-A-CATH;  Surgeon: Herbert Pun, MD;  Location: [facility];  Service: General;;   PORTACATH PLACEMENT N/A 04/02/2021   Procedure: INSERTION PORT-A-CATH;  Surgeon: Herbert Pun, MD;  Location: [facility];  Service: General;  Laterality: N/A;   SIMPLE MASTECTOMY WITH AXILLARY SENTINEL NODE BIOPSY Right 09/16/2021   Procedure: SIMPLE MASTECTOMY WITH AXILLARY SENTINEL NODE BIOPSY -- RF tag in axillary;  Surgeon: Herbert Pun, MD;  Location: [facility];  Service: General;  Laterality: Right;    There were no vitals filed for this visit.   Subjective Assessment - 10/24/21 1126     Subjective  I do not really have pain is just a discomfort under my armpit upper arm with overhead.  But is better.  I did not get another filling we can keep it and then adjust my left breast when we do my surgery in the fall.  I am supposed to get my simulation for radiation on 15 June.  I am wearing but 80% of the time  my day sleeve on my arm and then I still wearing that white compression at night.    Pertinent History Patient is a 56 year old female with history of right-sided breast cancer s/p right-sided total mastectomy with immediate reconstruction using tissue expander and Flex HD performed 09/16/2021 by Dr. [name] and Dr. [name].     At time of mastectomy, decision was made for complete axillary lymph node dissection.  Her Chemo-Port was also removed by general surgery.  Patient required multiple JP drains given lymph node dissection.  Expander was filled to a total of 300/475 cc.     Patient was last seen here in the office on 09/24/2021.  At that time, she was no longer requiring any  narcotic analgesics..   Plan was for continued activity modifications and compressive garments.  Per pt had 2 fillings in expander since then - maybe getting one more Wed - returning to work. Had chemo prrior to surgery and having Radiation simulation about june 15th - and then after that having expanders out and replace by implants. Refer to OT for education for lymphedema , pt had increase swelling since after surgery per pt in R upper arm. And limited R shoulder AROM over head - did get order from surgery PA Krista Blue for overhead ROM .    Patient Stated Goals I do not want my right arm to get bigger or have lymphedema and if I can get my motion better that I can use it overhead and I need to be able to get into radiation position.    Currently in Pain? No/denies                 LYMPHEDEMA/ONCOLOGY QUESTIONNAIRE - 10/24/21 0001       Right Upper Extremity Lymphedema   15 cm Proximal to Olecranon Process 36 cm    10 cm Proximal to Olecranon Process 35 cm    Olecranon Process 27.5 cm      Left Upper Extremity Lymphedema   15 cm Proximal to Olecranon Process 35 cm    10 cm Proximal to Olecranon Process 33.4 cm    Olecranon Process 27 cm            SHOULDER Flexion and ABD 130 degrees today -and in session 140     Patient was educated on lymphedema signs and symptoms and precautions as well as management at  OT evaluation.   Handout provided and reviewed with patient.  Patient right upper extremity was increased 2.6 cm, elbow 1.5 and forearm 1.3 cm compared to the left.   Patient has been wearing for 2 weeks now over-the-counter compression sleeve on right upper extremity about 80% of the day.  And at nighttime Isotoner glove with a Tubigrip D for hand to forearm and Tubigrip E for mid forearm to upper arm to wear at nighttime.  Patient showed decrease in circumference since wearing compression on right upper extremity.  Patient maintaining that progress.  Patient can stop  wearing nighttime compression on we will reassess in 2 weeks prior to simulation for radiation on 15 June.        Patient continues to show increase right shoulder flexion and abduction with home program but still not able to get into radiation position.  Reports mostly tightness in the axilla to the lateral breast.   Did do some scar massage and soft tissue massage in axilla and lateral breast with mini massager and manually by OT prior to range of motion.  Patient to continue to do active assisted range of motion using golf club in supine for shoulder flexion as well as abduction. Did get a order from [facility] from plastic surgery for overhead range of motion on evaluation day. Patient reports she cannot do the active assisted range of motion on the desk when sitting because her chair does not slide.  At this stage II patient's home program pulleys to do for shoulder flexion abduction-was able to do 20 reps pain-free.  Patient can do home program for range of motion 2-3 times a day.   Patient to keep pain at a slight pull and less than a 2 out of 10 discomfort.                 OT Education - 10/24/21 1128     Education Details Progress and changes to home exercises    Person(s) Educated Patient    Methods Explanation;Demonstration;Tactile cues;Verbal cues;Handout    Comprehension Verbal cues required;Returned demonstration;Verbalized understanding                 OT Long Term Goals - 10/13/21 1425       OT LONG TERM GOAL #1   Title Patient circumference in right upper extremity decreased to close to 1 cm difference compared to the left to be fitted with a compression sleeve for daytime during high risk activity.  To maintain circumference    Baseline Right upper arm increased 2.6 cm compared to the left my elbow 1.5 cm and proximal forearm 1.3 cm increased compared to the left-patient is right-handed but more increased than expected.  No compression at this time     Time 3    Period Weeks    Status New    Target Date 11/03/21      OT LONG TERM GOAL #2   Title Right shoulder active range of motion increase overhead to within normal limits for patient to reach into cabinets and overhead without increased symptoms    Baseline Patient shoulder flexion 96 degrees and abduction 80 degrees with a pull and discomfort in axilla and lateral breast.  Patient has not been doing any motion overhead since now and is patient is tomorrow 4 weeks postop patient is right-hand dominant    Time 6    Period Weeks    Status New    Target Date 11/24/21      OT LONG TERM GOAL #3   Title Patient is a right upper extremity active range of motion and strength improved to within normal limits for patient to pull overhead sweater out as well as takedown more than 4 pounds from top shelf and open and close heavy doors and refrigerator.    Baseline Patient 4 weeks postop 96 flexion and abduction 80 degrees of right shoulder motion with a pull and discomfort in axilla and lateral breast unable to overhead do range of motion not using hand and activities except for light ADLs.    Time 6    Period Weeks    Status New    Target Date 11/24/21                   Plan - 10/24/21 1129     Clinical Impression Statement Patient presented OT evaluation about 5 weeks post right mastectomy with axillary lymph node dissection, expanders put in at the same time of mastectomy.   Patient referred for lymphedema education for signs and symptoms as well as management.  Patient report  at time after for surgery patient had swelling in the right upper arm.  Patient present  at eval with increase of 2.6 cm circumference on the right upper arm 1.5 at elbow and forearm 1.3 compared to the left upper extremity.  Patient still wearing her compression bra postsurgery.  Patient seen for 2 visits since evaluation.  Making good progress in range of motion of right shoulder flexion and abduction.  Did  add pulleys for patient to her home program.  Patient supposed to get radiation simulation on 15 June.  Patient wearing over the counter compression sleeve on right upper extremity for about 2 weeks now and circumference is decrease in the right UE.  Patient can stop wearing nighttime Tubigrip D and E with Isotoner glove and will reassess in 2 wks prior to simulation for radiation.  Patient can benefit from OT services to decrease lymphedema in  right upper extremity and setting her up with a home program to manage her lymphedema as well as increased range of motion and strength for overhead activities with the right dominant hand.    OT Occupational Profile and History Problem Focused Assessment - Including review of records relating to presenting problem    Occupational performance deficits (Please refer to evaluation for details): ADL's;IADL's;Work;Play;Leisure;Social Participation    Body Structure / Function / Physical Skills Decreased knowledge of precautions;Flexibility;Scar mobility;ROM;IADL;Edema;UE functional use;Pain;Strength    Rehab Potential Good    Clinical Decision Making Several treatment options, min-mod task modification necessary    Comorbidities Affecting Occupational Performance: None    Modification or Assistance to Complete Evaluation  Min-Moderate modification of tasks or assist with assess necessary to complete eval    OT Frequency 1x / week    OT Duration 6 weeks    OT Treatment/Interventions Self-care/ADL training;Manual Therapy;Patient/family education;Passive range of motion;Therapeutic exercise;Scar mobilization;Manual lymph drainage    Consulted and Agree with Plan of Care Patient             Patient will benefit from skilled therapeutic intervention in order to improve the following deficits and impairments:   Body Structure / Function / Physical Skills: Decreased knowledge of precautions, Flexibility, Scar mobility, ROM, IADL, Edema, UE functional use, Pain,  Strength       Visit Diagnosis: Postmastectomy lymphedema syndrome  Stiffness of right shoulder, not elsewhere classified  Muscle weakness (generalized)  Scar tissue    Problem List Patient Active Problem List   Diagnosis Date Noted   Breast cancer ([place]) 09/16/2021   Genetic testing 05/06/2021   Family history of pancreatic cancer 04/23/2021   Family history of bladder cancer 04/23/2021   Carcinoma of breast, estrogen and progesterone receptor positive ([place]) 03/25/2021   Goals of care, counseling/discussion 03/25/2021   Menopause 10/05/2016   Mild renal insufficiency 10/05/2016   Benign neoplasm of breast 10/27/2012   Family history of breast cancer 10/27/2012    Rosalyn Gess, OTR/L,CLT 10/24/2021, 11:34 AM  [facility] [address] [place], [address] Phone: [phone]   Fax:  [phone]  Name: Emma Cuevas MRN: [id number] Date of Birth: 1966/04/16

## 2021-11-04 ENCOUNTER — Ambulatory Visit: Payer: Managed Care, Other (non HMO) | Admitting: Occupational Therapy

## 2021-11-04 DIAGNOSIS — L905 Scar conditions and fibrosis of skin: Secondary | ICD-10-CM

## 2021-11-04 DIAGNOSIS — I972 Postmastectomy lymphedema syndrome: Secondary | ICD-10-CM

## 2021-11-04 DIAGNOSIS — M25611 Stiffness of right shoulder, not elsewhere classified: Secondary | ICD-10-CM

## 2021-11-04 DIAGNOSIS — M6281 Muscle weakness (generalized): Secondary | ICD-10-CM

## 2021-11-04 NOTE — Therapy (Signed)
[place] [facility] [address], [place], [address] Phone: [phone]   Fax:  [phone]  Occupational Therapy Treatment  Patient Details  Name: Emma Cuevas MRN: [id number] Date of Birth: Nov 15, 1965 Referring Provider (OT): Adela Glimpse Date: 11/04/2021   OT End of Session - 11/04/21 0849     Visit Number 4    Number of Visits 12    Date for OT Re-Evaluation 11/24/21    OT Start Time 0815    OT Stop Time 0848    OT Time Calculation (min) 33 min    Activity Tolerance Patient tolerated treatment well    Behavior During Therapy [facility] for tasks assessed/performed             Past Medical History:  Diagnosis Date   Anemia    Benign neoplasm of breast 2013   left breast   BRCA negative 2015   Breast cancer ([place])    Cancer ([place])    Family history of bladder cancer    Family history of breast cancer    Family history of malignant neoplasm of breast 2013   Family history of pancreatic cancer    Screening for obesity     Past Surgical History:  Procedure Laterality Date   ABDOMINAL HYSTERECTOMY  2011   partial   BREAST BIOPSY Left 2013   BREAST BIOPSY Right 03/14/2021   Stereo bx-anterior calcs, "coil" clip-path pending   BREAST BIOPSY Right 03/14/2021   stereo bx-calcs, "Ribbon" clip-path pending   breast biopsy Right 03/14/2021   [place] Bx, Axilla, path pending   BREAST BIOPSY Left 04/30/2021   BREAST BIOPSY WITH RADIO FREQUENCY LOCALIZER Right 09/09/2021   RF tag in axilla   BREAST RECONSTRUCTION WITH PLACEMENT OF TISSUE EXPANDER AND FLEX HD (ACELLULAR HYDRATED DERMIS) Right 09/16/2021   Procedure: RIGHT BREAST RECONSTRUCTION WITH PLACEMENT OF TISSUE EXPANDER AND FLEX HD (ACELLULAR HYDRATED DERMIS);  Surgeon: Cindra Presume, MD;  Location: [facility] OR[facility];  Service: Plastics;  Laterality: Right;   BREAST SURGERY Left 10/16/2011   left breast finesse biopsy   COLONOSCOPY WITH PROPOFOL N/A 12/05/2014    Procedure: COLONOSCOPY WITH PROPOFOL;  Surgeon: Christene Lye, MD;  Location: [facility];  Service: Endoscopy;  Laterality: N/A;   DILATION AND CURETTAGE OF UTERUS     IR CV LINE INJECTION  05/06/2021   IR IMAGING GUIDED PORT INSERTION  05/08/2021   PORT-A-CATH REMOVAL  09/16/2021   Procedure: REMOVAL PORT-A-CATH;  Surgeon: Herbert Pun, MD;  Location: [facility] OR[facility];  Service: General;;   PORTACATH PLACEMENT N/A 04/02/2021   Procedure: INSERTION PORT-A-CATH;  Surgeon: Herbert Pun, MD;  Location: [facility];  Service: General;  Laterality: N/A;   SIMPLE MASTECTOMY WITH AXILLARY SENTINEL NODE BIOPSY Right 09/16/2021   Procedure: SIMPLE MASTECTOMY WITH AXILLARY SENTINEL NODE BIOPSY -- RF tag in axillary;  Surgeon: Herbert Pun, MD;  Location: [facility];  Service: General;  Laterality: Right;    There were no vitals filed for this visit.   Subjective Assessment - 11/04/21 0848     Subjective  Doing so much better I am walking more and do the pulleys you gave me for my shoulder motion 2 x day -feeling maybe a slight pull but less even than last time.  I do wear my compression sleeve during the day but did not wear it as much [place].    Pertinent History Patient is a 56 year old  female with history of right-sided breast cancer s/p right-sided total mastectomy with immediate reconstruction using tissue expander and Flex HD performed 09/16/2021 by Dr. [name] and Dr. [name].     At time of mastectomy, decision was made for complete axillary lymph node dissection.  Her Chemo-Port was also removed by general surgery.  Patient required multiple JP drains given lymph node dissection.  Expander was filled to a total of 300/475 cc.     Patient was last seen here in the office on 09/24/2021.  At that time, she was no longer requiring any narcotic analgesics..   Plan was for continued activity modifications and compressive garments.  Per pt had 2  fillings in expander since then - maybe getting one more Wed - returning to work. Had chemo prrior to surgery and having Radiation simulation about june 15th - and then after that having expanders out and replace by implants. Refer to OT for education for lymphedema , pt had increase swelling since after surgery per pt in R upper arm. And limited R shoulder AROM over head - did get order from surgery PA Krista Blue for overhead ROM .    Patient Stated Goals I do not want my right arm to get bigger or have lymphedema and if I can get my motion better that I can use it overhead and I need to be able to get into radiation position.    Currently in Pain? No/denies                 LYMPHEDEMA/ONCOLOGY QUESTIONNAIRE - 11/04/21 0001       Right Upper Extremity Lymphedema   15 cm Proximal to Olecranon Process 36 cm    10 cm Proximal to Olecranon Process 35.5 cm    Olecranon Process 28.2 cm    15 cm Proximal to Ulnar Styloid Process 27 cm    10 cm Proximal to Ulnar Styloid Process 23.8 cm    Just Proximal to Ulnar Styloid Process 16.5 cm      Left Upper Extremity Lymphedema   15 cm Proximal to Olecranon Process 35.5 cm    10 cm Proximal to Olecranon Process 33.4 cm    Olecranon Process 27.5 cm    15 cm Proximal to Ulnar Styloid Process 26.5 cm    10 cm Proximal to Ulnar Styloid Process 23.3 cm             SHOULDER Flexion 150 and and ABD 150 degrees but compensate moving in some flexion at end range Patient able to get easily into radiation position now.     Patient was educated on lymphedema signs and symptoms and precautions as well as management at  OT evaluation.   Handout provided and reviewed with patient.  Patient right upper extremity was increased 2.6 cm, elbow 1.5 and forearm 1.3 cm compared to the left.   Patient has been wearing for 4 weeks now over-the-counter compression sleeve on right upper extremity about 80% of the day.  She stopped weariabout 2 weeks ago  nighttime  Isotoner glove with a Tubigrip D for hand to forearm and Tubigrip E for mid forearm to upper arm to wear at nighttime.  Patient showed decrease in circumference since wearing compression on right upper extremity.  Patient circumference was low but increased in the right upper extremity this date but when measured left was increased to patient still show decreased lymphedema circumference compared to evaluation.  We will continue to monitor for lymphedema symptoms through out radiation.  Patient made great progress in range of motion since start of care with using wand in supine but especially after adding police last session for flexion and abduction.  Patient has a slight pull in the right axilla with overhead and pec area.  But that should be improving after patient's next surgery when expanders is switched out for silicone implants. Patient to continue wearing over-the-counter compression sleeve during the day and continue once or twice a day with her range of motion exercises to maintain her motion throughout radiation.  Did add 2 pound weight for patient for scapular retraction as well as elbow extension and overhead punches 2 sets of 12 once or a day or 3 times a week Patient to monitor for increased lymphedema symptoms in the right upper extremity. Patient to follow-up with me 2 weeks into radiation.                   OT Education - 11/04/21 0849     Education Details Progress and changes to home exercises    Person(s) Educated Patient    Methods Explanation;Demonstration;Tactile cues;Verbal cues;Handout    Comprehension Verbal cues required;Returned demonstration;Verbalized understanding                 OT Long Term Goals - 10/13/21 1425       OT LONG TERM GOAL #1   Title Patient circumference in right upper extremity decreased to close to 1 cm difference compared to the left to be fitted with a compression sleeve for daytime during high risk activity.  To  maintain circumference    Baseline Right upper arm increased 2.6 cm compared to the left my elbow 1.5 cm and proximal forearm 1.3 cm increased compared to the left-patient is right-handed but more increased than expected.  No compression at this time    Time 3    Period Weeks    Status New    Target Date 11/03/21      OT LONG TERM GOAL #2   Title Right shoulder active range of motion increase overhead to within normal limits for patient to reach into cabinets and overhead without increased symptoms    Baseline Patient shoulder flexion 96 degrees and abduction 80 degrees with a pull and discomfort in axilla and lateral breast.  Patient has not been doing any motion overhead since now and is patient is tomorrow 4 weeks postop patient is right-hand dominant    Time 6    Period Weeks    Status New    Target Date 11/24/21      OT LONG TERM GOAL #3   Title Patient is a right upper extremity active range of motion and strength improved to within normal limits for patient to pull overhead sweater out as well as takedown more than 4 pounds from top shelf and open and close heavy doors and refrigerator.    Baseline Patient 4 weeks postop 96 flexion and abduction 80 degrees of right shoulder motion with a pull and discomfort in axilla and lateral breast unable to overhead do range of motion not using hand and activities except for light ADLs.    Time 6    Period Weeks    Status New    Target Date 11/24/21                   Plan - 11/04/21 0850     Clinical Impression Statement Patient presented OT evaluation about 5 weeks post right mastectomy with axillary lymph  node dissection, expanders put in at the same time of mastectomy.   Patient referred for lymphedema education for signs and symptoms as well as management.  Patient report at time after for surgery patient had swelling in the right upper arm.  Patient present  at eval with increase of 2.6 cm circumference on the right upper arm 1.5  at elbow and forearm 1.3 compared to the left upper extremity.  Patient still wearing her compression bra postsurgery.  Patient seen for 3 visits since evaluation.  Patient made great progress in range of motion for right shoulder to 150 degrees of flexion,abduction compensating a little bit with flexion end range  with external rotation tight in end rang but can get easily into radiation position.  Tightness mostly over the pec at the expanders placement. Should improve after next surgery.  Reinforced with patient to continue with pulleys for end range of motion during radiation to maintain her motion.  Did at 2 pound weight for light strengthening bilaterally but patient to use her over-the-counter compression sleeve.  Patient supposed to get radiation simulation on 15 June.  Patient wearing over the counter compression sleeve on right upper extremity and her circumference is decrease in the right UE.  This date her circumference was increased but on the left 2 we will continue to monitor her lymphedema symptoms throughout radiation.  Patient did stop wearing nighttime Tubigrip D and E with Isotoner glove  now for about 2 -3 wks.  Patient can benefit from OT services to decrease lymphedema in  right upper extremity and setting her up with a home program to manage her lymphedema during radiation and reconstruction surgery as well as increased range of motion and strength for overhead activities with the right dominant hand.    OT Occupational Profile and History Problem Focused Assessment - Including review of records relating to presenting problem    Occupational performance deficits (Please refer to evaluation for details): ADL's;IADL's;Work;Play;Leisure;Social Participation    Body Structure / Function / Physical Skills Decreased knowledge of precautions;Flexibility;Scar mobility;ROM;IADL;Edema;UE functional use;Pain;Strength    Rehab Potential Good    Clinical Decision Making Several treatment options,  min-mod task modification necessary    Comorbidities Affecting Occupational Performance: None    Modification or Assistance to Complete Evaluation  Min-Moderate modification of tasks or assist with assess necessary to complete eval    OT Frequency Biweekly    OT Duration 6 weeks    OT Treatment/Interventions Self-care/ADL training;Manual Therapy;Patient/family education;Passive range of motion;Therapeutic exercise;Scar mobilization;Manual lymph drainage    Consulted and Agree with Plan of Care Patient             Patient will benefit from skilled therapeutic intervention in order to improve the following deficits and impairments:   Body Structure / Function / Physical Skills: Decreased knowledge of precautions, Flexibility, Scar mobility, ROM, IADL, Edema, UE functional use, Pain, Strength       Visit Diagnosis: Postmastectomy lymphedema syndrome  Stiffness of right shoulder, not elsewhere classified  Muscle weakness (generalized)  Scar tissue    Problem List Patient Active Problem List   Diagnosis Date Noted   Breast cancer ([place]) 09/16/2021   Genetic testing 05/06/2021   Family history of pancreatic cancer 04/23/2021   Family history of bladder cancer 04/23/2021   Carcinoma of breast, estrogen and progesterone receptor positive ([place]) 03/25/2021   Goals of care, counseling/discussion 03/25/2021   Menopause 10/05/2016   Mild renal insufficiency 10/05/2016   Benign neoplasm of breast 10/27/2012  Family history of breast cancer 10/27/2012    Rosalyn Gess, OTR/L,CLT 11/04/2021, 8:56 AM  [place] [facility] [address], [place], [address] Phone: [phone]   Fax:  [phone]  Name: Emma Cuevas MRN: [id number] Date of Birth: 11/14/1965

## 2021-11-06 ENCOUNTER — Ambulatory Visit
Admission: RE | Admit: 2021-11-06 | Discharge: 2021-11-06 | Disposition: A | Discharge status: Home / Self Care | Payer: Managed Care, Other (non HMO) | Source: Ambulatory Visit | Attending: Radiation Oncology | Admitting: Radiation Oncology

## 2021-11-06 DIAGNOSIS — Z170 Estrogen receptor positive status [ER+]: Secondary | ICD-10-CM | POA: Insufficient documentation

## 2021-11-06 DIAGNOSIS — Z510 Encounter for antineoplastic radiation therapy: Secondary | ICD-10-CM | POA: Insufficient documentation

## 2021-11-06 DIAGNOSIS — C50811 Malignant neoplasm of overlapping sites of right female breast: Secondary | ICD-10-CM | POA: Insufficient documentation

## 2021-11-06 DIAGNOSIS — C773 Secondary and unspecified malignant neoplasm of axilla and upper limb lymph nodes: Secondary | ICD-10-CM | POA: Diagnosis not present

## 2021-11-07 ENCOUNTER — Other Ambulatory Visit: Payer: Self-pay | Admitting: *Deleted

## 2021-11-07 DIAGNOSIS — Z170 Estrogen receptor positive status [ER+]: Secondary | ICD-10-CM

## 2021-11-10 DIAGNOSIS — Z510 Encounter for antineoplastic radiation therapy: Secondary | ICD-10-CM | POA: Diagnosis not present

## 2021-11-13 ENCOUNTER — Ambulatory Visit: Admission: RE | Admit: 2021-11-13 | Payer: Managed Care, Other (non HMO) | Source: Ambulatory Visit

## 2021-11-13 DIAGNOSIS — Z510 Encounter for antineoplastic radiation therapy: Secondary | ICD-10-CM | POA: Diagnosis not present

## 2021-11-17 ENCOUNTER — Ambulatory Visit
Admission: RE | Admit: 2021-11-17 | Discharge: 2021-11-17 | Disposition: A | Discharge status: Home / Self Care | Payer: Managed Care, Other (non HMO) | Source: Ambulatory Visit | Attending: Radiation Oncology | Admitting: Radiation Oncology

## 2021-11-17 ENCOUNTER — Other Ambulatory Visit: Payer: Self-pay

## 2021-11-17 DIAGNOSIS — Z510 Encounter for antineoplastic radiation therapy: Secondary | ICD-10-CM | POA: Diagnosis not present

## 2021-11-17 LAB — RAD ONC ARIA SESSION SUMMARY
Course Elapsed Days: 0
Plan Fractions Treated to Date: 1
Plan Prescribed Dose Per Fraction: 1.8 Gy
Plan Total Fractions Prescribed: 28
Plan Total Prescribed Dose: 50.4 Gy
Reference Point Dosage Given to Date: 1.8 Gy
Reference Point Session Dosage Given: 1.8 Gy
Session Number: 1

## 2021-11-18 ENCOUNTER — Telehealth: Payer: Self-pay | Admitting: *Deleted

## 2021-11-18 ENCOUNTER — Ambulatory Visit
Admission: RE | Admit: 2021-11-18 | Discharge: 2021-11-18 | Disposition: A | Discharge status: Home / Self Care | Payer: Managed Care, Other (non HMO) | Source: Ambulatory Visit | Attending: Radiation Oncology | Admitting: Radiation Oncology

## 2021-11-18 ENCOUNTER — Other Ambulatory Visit: Payer: Self-pay

## 2021-11-18 DIAGNOSIS — Z510 Encounter for antineoplastic radiation therapy: Secondary | ICD-10-CM | POA: Diagnosis not present

## 2021-11-18 LAB — RAD ONC ARIA SESSION SUMMARY
Course Elapsed Days: 1
Plan Fractions Treated to Date: 2
Plan Prescribed Dose Per Fraction: 1.8 Gy
Plan Total Fractions Prescribed: 28
Plan Total Prescribed Dose: 50.4 Gy
Reference Point Dosage Given to Date: 3.6 Gy
Reference Point Session Dosage Given: 1.8 Gy
Session Number: 2

## 2021-11-19 ENCOUNTER — Other Ambulatory Visit: Payer: Self-pay

## 2021-11-19 ENCOUNTER — Ambulatory Visit
Admission: RE | Admit: 2021-11-19 | Discharge: 2021-11-19 | Disposition: A | Discharge status: Home / Self Care | Payer: Managed Care, Other (non HMO) | Source: Ambulatory Visit | Attending: Radiation Oncology | Admitting: Radiation Oncology

## 2021-11-19 DIAGNOSIS — Z510 Encounter for antineoplastic radiation therapy: Secondary | ICD-10-CM | POA: Diagnosis not present

## 2021-11-19 LAB — RAD ONC ARIA SESSION SUMMARY
Course Elapsed Days: 2
Plan Fractions Treated to Date: 3
Plan Prescribed Dose Per Fraction: 1.8 Gy
Plan Total Fractions Prescribed: 28
Plan Total Prescribed Dose: 50.4 Gy
Reference Point Dosage Given to Date: 5.4 Gy
Reference Point Session Dosage Given: 1.8 Gy
Session Number: 3

## 2021-11-20 ENCOUNTER — Other Ambulatory Visit: Payer: Self-pay

## 2021-11-20 ENCOUNTER — Ambulatory Visit
Admission: RE | Admit: 2021-11-20 | Discharge: 2021-11-20 | Disposition: A | Discharge status: Home / Self Care | Payer: Managed Care, Other (non HMO) | Source: Ambulatory Visit | Attending: Radiation Oncology | Admitting: Radiation Oncology

## 2021-11-20 ENCOUNTER — Inpatient Hospital Stay: Payer: Managed Care, Other (non HMO) | Attending: Nurse Practitioner

## 2021-11-20 DIAGNOSIS — C50811 Malignant neoplasm of overlapping sites of right female breast: Secondary | ICD-10-CM | POA: Insufficient documentation

## 2021-11-20 DIAGNOSIS — C773 Secondary and unspecified malignant neoplasm of axilla and upper limb lymph nodes: Secondary | ICD-10-CM | POA: Insufficient documentation

## 2021-11-20 DIAGNOSIS — Z510 Encounter for antineoplastic radiation therapy: Secondary | ICD-10-CM | POA: Insufficient documentation

## 2021-11-20 DIAGNOSIS — Z170 Estrogen receptor positive status [ER+]: Secondary | ICD-10-CM | POA: Insufficient documentation

## 2021-11-20 LAB — CBC
HCT: 36.3 % (ref 36.0–46.0)
Hemoglobin: 11.4 g/dL — ABNORMAL LOW (ref 12.0–15.0)
MCH: 28.3 pg (ref 26.0–34.0)
MCHC: 31.4 g/dL (ref 30.0–36.0)
MCV: 90.1 fL (ref 80.0–100.0)
Platelets: 237 10*3/uL (ref 150–400)
RBC: 4.03 MIL/uL (ref 3.87–5.11)
RDW: 13.9 % (ref 11.5–15.5)
WBC: 4.9 10*3/uL (ref 4.0–10.5)
nRBC: 0 % (ref 0.0–0.2)

## 2021-11-20 LAB — RAD ONC ARIA SESSION SUMMARY
Course Elapsed Days: 3
Plan Fractions Treated to Date: 4
Plan Prescribed Dose Per Fraction: 1.8 Gy
Plan Total Fractions Prescribed: 28
Plan Total Prescribed Dose: 50.4 Gy
Reference Point Dosage Given to Date: 7.2 Gy
Reference Point Session Dosage Given: 1.8 Gy
Session Number: 4

## 2021-11-21 ENCOUNTER — Ambulatory Visit
Admission: RE | Admit: 2021-11-21 | Discharge: 2021-11-21 | Disposition: A | Discharge status: Home / Self Care | Payer: Managed Care, Other (non HMO) | Source: Ambulatory Visit | Attending: Radiation Oncology | Admitting: Radiation Oncology

## 2021-11-21 ENCOUNTER — Other Ambulatory Visit: Payer: Self-pay

## 2021-11-21 DIAGNOSIS — Z510 Encounter for antineoplastic radiation therapy: Secondary | ICD-10-CM | POA: Diagnosis not present

## 2021-11-21 LAB — RAD ONC ARIA SESSION SUMMARY
Course Elapsed Days: 4
Plan Fractions Treated to Date: 5
Plan Prescribed Dose Per Fraction: 1.8 Gy
Plan Total Fractions Prescribed: 28
Plan Total Prescribed Dose: 50.4 Gy
Reference Point Dosage Given to Date: 9 Gy
Reference Point Session Dosage Given: 1.8 Gy
Session Number: 5

## 2021-11-24 ENCOUNTER — Ambulatory Visit
Admission: RE | Admit: 2021-11-24 | Discharge: 2021-11-24 | Disposition: A | Discharge status: Home / Self Care | Payer: Managed Care, Other (non HMO) | Source: Ambulatory Visit | Attending: Radiation Oncology | Admitting: Radiation Oncology

## 2021-11-24 ENCOUNTER — Other Ambulatory Visit: Payer: Self-pay

## 2021-11-24 DIAGNOSIS — C50911 Malignant neoplasm of unspecified site of right female breast: Secondary | ICD-10-CM | POA: Diagnosis not present

## 2021-11-24 DIAGNOSIS — C773 Secondary and unspecified malignant neoplasm of axilla and upper limb lymph nodes: Secondary | ICD-10-CM | POA: Insufficient documentation

## 2021-11-24 DIAGNOSIS — Z170 Estrogen receptor positive status [ER+]: Secondary | ICD-10-CM | POA: Diagnosis not present

## 2021-11-24 DIAGNOSIS — Z510 Encounter for antineoplastic radiation therapy: Secondary | ICD-10-CM | POA: Diagnosis present

## 2021-11-24 DIAGNOSIS — I890 Lymphedema, not elsewhere classified: Secondary | ICD-10-CM | POA: Insufficient documentation

## 2021-11-24 LAB — RAD ONC ARIA SESSION SUMMARY
Course Elapsed Days: 7
Plan Fractions Treated to Date: 6
Plan Prescribed Dose Per Fraction: 1.8 Gy
Plan Total Fractions Prescribed: 28
Plan Total Prescribed Dose: 50.4 Gy
Reference Point Dosage Given to Date: 10.8 Gy
Reference Point Session Dosage Given: 1.8 Gy
Session Number: 6

## 2021-11-26 ENCOUNTER — Other Ambulatory Visit: Payer: Self-pay

## 2021-11-26 ENCOUNTER — Ambulatory Visit
Admission: RE | Admit: 2021-11-26 | Discharge: 2021-11-26 | Disposition: A | Discharge status: Home / Self Care | Payer: Managed Care, Other (non HMO) | Source: Ambulatory Visit | Attending: Radiation Oncology | Admitting: Radiation Oncology

## 2021-11-26 DIAGNOSIS — Z510 Encounter for antineoplastic radiation therapy: Secondary | ICD-10-CM | POA: Diagnosis not present

## 2021-11-26 LAB — RAD ONC ARIA SESSION SUMMARY
Course Elapsed Days: 9
Plan Fractions Treated to Date: 7
Plan Prescribed Dose Per Fraction: 1.8 Gy
Plan Total Fractions Prescribed: 28
Plan Total Prescribed Dose: 50.4 Gy
Reference Point Dosage Given to Date: 12.6 Gy
Reference Point Session Dosage Given: 1.8 Gy
Session Number: 7

## 2021-11-27 ENCOUNTER — Ambulatory Visit
Admission: RE | Admit: 2021-11-27 | Discharge: 2021-11-27 | Disposition: A | Discharge status: Home / Self Care | Payer: Managed Care, Other (non HMO) | Source: Ambulatory Visit | Attending: Radiation Oncology | Admitting: Radiation Oncology

## 2021-11-27 ENCOUNTER — Other Ambulatory Visit: Payer: Self-pay

## 2021-11-27 ENCOUNTER — Other Ambulatory Visit: Payer: Self-pay | Admitting: *Deleted

## 2021-11-27 DIAGNOSIS — Z510 Encounter for antineoplastic radiation therapy: Secondary | ICD-10-CM | POA: Diagnosis not present

## 2021-11-27 LAB — RAD ONC ARIA SESSION SUMMARY
Course Elapsed Days: 10
Plan Fractions Treated to Date: 8
Plan Prescribed Dose Per Fraction: 1.8 Gy
Plan Total Fractions Prescribed: 28
Plan Total Prescribed Dose: 50.4 Gy
Reference Point Dosage Given to Date: 14.4 Gy
Reference Point Session Dosage Given: 1.8 Gy
Session Number: 8

## 2021-11-28 ENCOUNTER — Ambulatory Visit
Admission: RE | Admit: 2021-11-28 | Discharge: 2021-11-28 | Disposition: A | Discharge status: Home / Self Care | Payer: Managed Care, Other (non HMO) | Source: Ambulatory Visit | Attending: Radiation Oncology | Admitting: Radiation Oncology

## 2021-11-28 ENCOUNTER — Other Ambulatory Visit: Payer: Self-pay

## 2021-11-28 DIAGNOSIS — Z510 Encounter for antineoplastic radiation therapy: Secondary | ICD-10-CM | POA: Diagnosis not present

## 2021-11-28 LAB — RAD ONC ARIA SESSION SUMMARY
Course Elapsed Days: 11
Plan Fractions Treated to Date: 9
Plan Prescribed Dose Per Fraction: 1.8 Gy
Plan Total Fractions Prescribed: 28
Plan Total Prescribed Dose: 50.4 Gy
Reference Point Dosage Given to Date: 16.2 Gy
Reference Point Session Dosage Given: 1.8 Gy
Session Number: 9

## 2021-12-01 ENCOUNTER — Ambulatory Visit: Payer: Managed Care, Other (non HMO) | Attending: Oncology | Admitting: Occupational Therapy

## 2021-12-01 ENCOUNTER — Other Ambulatory Visit: Payer: Self-pay

## 2021-12-01 ENCOUNTER — Ambulatory Visit
Admission: RE | Admit: 2021-12-01 | Discharge: 2021-12-01 | Disposition: A | Discharge status: Home / Self Care | Payer: Managed Care, Other (non HMO) | Source: Ambulatory Visit | Attending: Radiation Oncology | Admitting: Radiation Oncology

## 2021-12-01 DIAGNOSIS — M6281 Muscle weakness (generalized): Secondary | ICD-10-CM | POA: Diagnosis present

## 2021-12-01 DIAGNOSIS — L905 Scar conditions and fibrosis of skin: Secondary | ICD-10-CM | POA: Diagnosis present

## 2021-12-01 DIAGNOSIS — I972 Postmastectomy lymphedema syndrome: Secondary | ICD-10-CM | POA: Diagnosis not present

## 2021-12-01 DIAGNOSIS — M25611 Stiffness of right shoulder, not elsewhere classified: Secondary | ICD-10-CM

## 2021-12-01 DIAGNOSIS — Z510 Encounter for antineoplastic radiation therapy: Secondary | ICD-10-CM | POA: Diagnosis not present

## 2021-12-01 LAB — RAD ONC ARIA SESSION SUMMARY
Course Elapsed Days: 14
Plan Fractions Treated to Date: 10
Plan Prescribed Dose Per Fraction: 1.8 Gy
Plan Total Fractions Prescribed: 28
Plan Total Prescribed Dose: 50.4 Gy
Reference Point Dosage Given to Date: 18 Gy
Reference Point Session Dosage Given: 1.8 Gy
Session Number: 10

## 2021-12-01 NOTE — Therapy (Signed)
[facility] [address], [place], [address] Phone: [phone]   Fax:  [phone]  Occupational Therapy Treatment  Patient Details  Name: Emma Cuevas MRN: [id number] Date of Birth: [birth year] Referring Provider (OT): Adela Glimpse Date: 12/01/2021   OT End of Session - 12/01/21 1203     Visit Number 5    Number of Visits 12    Date for OT Re-Evaluation 11/24/21    OT Start Time 0950    OT Stop Time 1020    OT Time Calculation (min) 30 min    Activity Tolerance Patient tolerated treatment well    Behavior During Therapy [facility] for tasks assessed/performed             Past Medical History:  Diagnosis Date   Anemia    Benign neoplasm of breast 2013   left breast   BRCA negative 2015   Breast cancer ([place])    Cancer ([place])    Family history of bladder cancer    Family history of breast cancer    Family history of malignant neoplasm of breast 2013   Family history of pancreatic cancer    Screening for obesity     Past Surgical History:  Procedure Laterality Date   ABDOMINAL HYSTERECTOMY  2011   partial   BREAST BIOPSY Left 2013   BREAST BIOPSY Right 03/14/2021   Stereo bx-anterior calcs, "coil" clip-path pending   BREAST BIOPSY Right 03/14/2021   stereo bx-calcs, "Ribbon" clip-path pending   breast biopsy Right 03/14/2021   [place] Bx, Axilla, path pending   BREAST BIOPSY Left 04/30/2021   BREAST BIOPSY WITH RADIO FREQUENCY LOCALIZER Right 09/09/2021   RF tag in axilla   BREAST RECONSTRUCTION WITH PLACEMENT OF TISSUE EXPANDER AND FLEX HD (ACELLULAR HYDRATED DERMIS) Right 09/16/2021   Procedure: RIGHT BREAST RECONSTRUCTION WITH PLACEMENT OF TISSUE EXPANDER AND FLEX HD (ACELLULAR HYDRATED DERMIS);  Surgeon: Cindra Presume, MD;  Location: [facility] ORS;  Service: Plastics;  Laterality: Right;   BREAST SURGERY Left 10/16/2011   left breast finesse biopsy   COLONOSCOPY WITH PROPOFOL N/A 12/05/2014    Procedure: COLONOSCOPY WITH PROPOFOL;  Surgeon: Christene Lye, MD;  Location: [facility];  Service: Endoscopy;  Laterality: N/A;   DILATION AND CURETTAGE OF UTERUS     IR CV LINE INJECTION  05/06/2021   IR IMAGING GUIDED PORT INSERTION  05/08/2021   PORT-A-CATH REMOVAL  09/16/2021   Procedure: REMOVAL PORT-A-CATH;  Surgeon: Herbert Pun, MD;  Location: [facility] ORS;  Service: General;;   PORTACATH PLACEMENT N/A 04/02/2021   Procedure: INSERTION PORT-A-CATH;  Surgeon: Herbert Pun, MD;  Location: [facility];  Service: General;  Laterality: N/A;   SIMPLE MASTECTOMY WITH AXILLARY SENTINEL NODE BIOPSY Right 09/16/2021   Procedure: SIMPLE MASTECTOMY WITH AXILLARY SENTINEL NODE BIOPSY -- RF tag in axillary;  Surgeon: Herbert Pun, MD;  Location: [facility];  Service: General;  Laterality: Right;    There were no vitals filed for this visit.   Subjective Assessment - 12/01/21 1012     Subjective  I am only in about 10 days of radiation.  I had to do the simulation and then had to wait a little bit so I started little later.  My numbness in my upper arm is getting better.  I wore my sleeve may be about 2 or 3 times a week.  I motion is doing good I am doing my exercises.  I love the  pulleys.    Pertinent History Patient is a 56 year old female with history of right-sided breast cancer s/p right-sided total mastectomy with immediate reconstruction using tissue expander and Flex HD performed 09/16/2021 by Dr. Claudia Cuevas and Dr. Windell Cuevas.     At time of mastectomy, decision was made for complete axillary lymph node dissection.  Her Chemo-Port was also removed by general surgery.  Patient required multiple JP drains given lymph node dissection.  Expander was filled to a total of 300/475 cc.     Patient was last seen here in the office on 09/24/2021.  At that time, she was no longer requiring any narcotic analgesics..   Plan was for continued activity modifications and compressive  garments.  Per pt had 2 fillings in expander since then - maybe getting one more Wed - returning to work. Had chemo prrior to surgery and having Radiation simulation about june 15th - and then after that having expanders out and replace by implants. Refer to OT for education for lymphedema , pt had increase swelling since after surgery per pt in R upper arm. And limited R shoulder AROM over head - did get order from surgery PA Emma Cuevas for overhead ROM .    Patient Stated Goals I do not want my right arm to get bigger or have lymphedema and if I can get my motion better that I can use it overhead and I need to be able to get into radiation position.    Currently in Pain? No/denies                 LYMPHEDEMA/ONCOLOGY QUESTIONNAIRE - 12/01/21 0001       Right Upper Extremity Lymphedema   15 cm Proximal to Olecranon Process 36.8 cm    10 cm Proximal to Olecranon Process 35.5 cm    Olecranon Process 29 cm    15 cm Proximal to Ulnar Styloid Process 28.5 cm    10 cm Proximal to Ulnar Styloid Process 24 cm    Just Proximal to Ulnar Styloid Process 16.5 cm    Across Hand at [employer] 18.5 cm    At [place] of 2nd Digit 6 cm    At [facility] of Thumb 6 cm      Left Upper Extremity Lymphedema   15 cm Proximal to Olecranon Process 34.5 cm    10 cm Proximal to Olecranon Process 33 cm    Olecranon Process 28 cm    15 cm Proximal to Ulnar Styloid Process 26.5 cm    10 cm Proximal to Ulnar Styloid Process 23 cm    Just Proximal to Ulnar Styloid Process 17 cm    Across Hand at [employer] 18.4 cm              Patient arrived today after being seen a month ago.  Patient started radiation in the meantime.  At 10th session of radiation this date. Patient right upper extremity circumference increased compared to 4 weeks ago. Patient reports she only wears the sleeve about 2 times to 3 times a week because of the hot weather.  Patient  R UE increased by 2.5 cm in the upper arm and wrist  and elbow 1 cm.  Forearm increased by 2 cm compared to the left. Patient's in the past was wearing a Tubigrip D and E with Isotoner glove as well as a over-the-counter compression sleeve that was able to decrease her circumference prior to radiation. But at this stage patient do  need nighttime and daytime compression again to decrease circumference. We will reassess next week if patient need 24 hours compression until after radiation.  Her active range of motion increased greatly to within normal limits with a slight pull over the chest and axilla area. Patient tolerating radiation position well.                             OT Education - 12/01/21 1203     Education Details Progress and changes to home exercises    Person(s) Educated Patient    Methods Explanation;Demonstration;Tactile cues;Verbal cues;Handout    Comprehension Verbal cues required;Returned demonstration;Verbalized understanding                 OT Long Term Goals - 10/13/21 1425       OT LONG TERM GOAL #1   Title Patient circumference in right upper extremity decreased to close to 1 cm difference compared to the left to be fitted with a compression sleeve for daytime during high risk activity.  To maintain circumference    Baseline Right upper arm increased 2.6 cm compared to the left my elbow 1.5 cm and proximal forearm 1.3 cm increased compared to the left-patient is right-handed but more increased than expected.  No compression at this time    Time 3    Period Weeks    Status New    Target Date 11/03/21      OT LONG TERM GOAL #2   Title Right shoulder active range of motion increase overhead to within normal limits for patient to reach into cabinets and overhead without increased symptoms    Baseline Patient shoulder flexion 96 degrees and abduction 80 degrees with a pull and discomfort in axilla and lateral breast.  Patient has not been doing any motion overhead since now and is patient is  tomorrow 4 weeks postop patient is right-hand dominant    Time 6    Period Weeks    Status New    Target Date 11/24/21      OT LONG TERM GOAL #3   Title Patient is a right upper extremity active range of motion and strength improved to within normal limits for patient to pull overhead sweater out as well as takedown more than 4 pounds from top shelf and open and close heavy doors and refrigerator.    Baseline Patient 4 weeks postop 96 flexion and abduction 80 degrees of right shoulder motion with a pull and discomfort in axilla and lateral breast unable to overhead do range of motion not using hand and activities except for light ADLs.    Time 6    Period Weeks    Status New    Target Date 11/24/21                   Plan - 12/01/21 1203     Clinical Impression Statement Patient presented OT evaluation about 5 weeks post right mastectomy with axillary lymph node dissection, expanders put in at the same time of mastectomy.   Patient referred for lymphedema education for signs and symptoms as well as management.  Patient report at time after for surgery she had swelling in the right upper arm.  Patient present  at eval with increase of 2.6 cm circumference on the right upper arm 1.5 at elbow and forearm 1.3 compared to the left upper extremity.  Patient still wearing her compression bra postsurgery.  Patient made great  progress in range of motion for right shoulder to  [facility] in  flexion,abduction and ext rotation. Able to get in position for radiation.  Reinforced with patient to continue with pulleys for end range of motion during radiation to maintain her motion. Pt recommend to wear her  over-the-counter compression sleeve.  Patient wearing over the counter compression sleeve on right upper extremity  prior to radiation  circumference decreased. But this date pt arrive with increase in circumference in R compare to the L by 2.5 cm in upper arm and 2 cm in forearm . Pt was fitted with  tubigrip D and E to wear night time and daytime compression sleeve during day for week and follow up.  Patient can benefit from OT services to decrease lymphedema in  right upper extremity and setting her up with a home program to manage her lymphedema during radiation and reconstruction surgery as well as increased range of motion and strength for overhead activities with the right dominant hand.    OT Occupational Profile and History Problem Focused Assessment - Including review of records relating to presenting problem    Occupational performance deficits (Please refer to evaluation for details): ADL's;IADL's;Work;Play;Leisure;Social Participation    Body Structure / Function / Physical Skills Decreased knowledge of precautions;Flexibility;Scar mobility;ROM;IADL;Edema;UE functional use;Pain;Strength    Rehab Potential Good    Clinical Decision Making Several treatment options, min-mod task modification necessary    Comorbidities Affecting Occupational Performance: None    Modification or Assistance to Complete Evaluation  Min-Moderate modification of tasks or assist with assess necessary to complete eval    OT Frequency 1x / week    OT Duration 4 weeks    OT Treatment/Interventions Self-care/ADL training;Manual Therapy;Patient/family education;Passive range of motion;Therapeutic exercise;Scar mobilization;Manual lymph drainage    Consulted and Agree with Plan of Care Patient             Patient will benefit from skilled therapeutic intervention in order to improve the following deficits and impairments:   Body Structure / Function / Physical Skills: Decreased knowledge of precautions, Flexibility, Scar mobility, ROM, IADL, Edema, UE functional use, Pain, Strength       Visit Diagnosis: Postmastectomy lymphedema syndrome  Stiffness of right shoulder, not elsewhere classified  Muscle weakness (generalized)  Scar tissue    Problem List Patient Active Problem List   Diagnosis  Date Noted   Breast cancer ([place]) 09/16/2021   Genetic testing 05/06/2021   Family history of pancreatic cancer 04/23/2021   Family history of bladder cancer 04/23/2021   Carcinoma of breast, estrogen and progesterone receptor positive ([place]) 03/25/2021   Goals of care, counseling/discussion 03/25/2021   Menopause 10/05/2016   Mild renal insufficiency 10/05/2016   Benign neoplasm of breast 10/27/2012   Family history of breast cancer 10/27/2012    Rosalyn Gess, OTR/L,CLT 12/01/2021, 12:09 PM  [facility] [address], [place], [address] Phone: [phone]   Fax:  [phone]  Name: MATYLDA FEHRING MRN: [id number] Date of Birth: January 03, 1966

## 2021-12-02 ENCOUNTER — Other Ambulatory Visit: Payer: Self-pay

## 2021-12-02 ENCOUNTER — Ambulatory Visit
Admission: RE | Admit: 2021-12-02 | Discharge: 2021-12-02 | Disposition: A | Discharge status: Home / Self Care | Payer: Managed Care, Other (non HMO) | Source: Ambulatory Visit | Attending: Radiation Oncology | Admitting: Radiation Oncology

## 2021-12-02 DIAGNOSIS — Z510 Encounter for antineoplastic radiation therapy: Secondary | ICD-10-CM | POA: Diagnosis not present

## 2021-12-02 LAB — RAD ONC ARIA SESSION SUMMARY
Course Elapsed Days: 15
Plan Fractions Treated to Date: 11
Plan Prescribed Dose Per Fraction: 1.8 Gy
Plan Total Fractions Prescribed: 28
Plan Total Prescribed Dose: 50.4 Gy
Reference Point Dosage Given to Date: 19.8 Gy
Reference Point Session Dosage Given: 1.8 Gy
Session Number: 11

## 2021-12-03 ENCOUNTER — Other Ambulatory Visit: Payer: Self-pay

## 2021-12-03 ENCOUNTER — Ambulatory Visit
Admission: RE | Admit: 2021-12-03 | Discharge: 2021-12-03 | Disposition: A | Discharge status: Home / Self Care | Payer: Managed Care, Other (non HMO) | Source: Ambulatory Visit | Attending: Radiation Oncology | Admitting: Radiation Oncology

## 2021-12-03 DIAGNOSIS — Z510 Encounter for antineoplastic radiation therapy: Secondary | ICD-10-CM | POA: Diagnosis not present

## 2021-12-03 LAB — RAD ONC ARIA SESSION SUMMARY
Course Elapsed Days: 16
Plan Fractions Treated to Date: 12
Plan Prescribed Dose Per Fraction: 1.8 Gy
Plan Total Fractions Prescribed: 28
Plan Total Prescribed Dose: 50.4 Gy
Reference Point Dosage Given to Date: 21.6 Gy
Reference Point Session Dosage Given: 1.8 Gy
Session Number: 12

## 2021-12-04 ENCOUNTER — Other Ambulatory Visit: Payer: Self-pay

## 2021-12-04 ENCOUNTER — Inpatient Hospital Stay: Payer: Managed Care, Other (non HMO) | Attending: Nurse Practitioner

## 2021-12-04 ENCOUNTER — Ambulatory Visit
Admission: RE | Admit: 2021-12-04 | Discharge: 2021-12-04 | Disposition: A | Discharge status: Home / Self Care | Payer: Managed Care, Other (non HMO) | Source: Ambulatory Visit | Attending: Radiation Oncology | Admitting: Radiation Oncology

## 2021-12-04 DIAGNOSIS — Z9221 Personal history of antineoplastic chemotherapy: Secondary | ICD-10-CM | POA: Insufficient documentation

## 2021-12-04 DIAGNOSIS — R911 Solitary pulmonary nodule: Secondary | ICD-10-CM | POA: Insufficient documentation

## 2021-12-04 DIAGNOSIS — Z86018 Personal history of other benign neoplasm: Secondary | ICD-10-CM | POA: Insufficient documentation

## 2021-12-04 DIAGNOSIS — Z8052 Family history of malignant neoplasm of bladder: Secondary | ICD-10-CM | POA: Insufficient documentation

## 2021-12-04 DIAGNOSIS — C50911 Malignant neoplasm of unspecified site of right female breast: Secondary | ICD-10-CM | POA: Insufficient documentation

## 2021-12-04 DIAGNOSIS — I890 Lymphedema, not elsewhere classified: Secondary | ICD-10-CM | POA: Insufficient documentation

## 2021-12-04 DIAGNOSIS — Z170 Estrogen receptor positive status [ER+]: Secondary | ICD-10-CM | POA: Insufficient documentation

## 2021-12-04 DIAGNOSIS — Z923 Personal history of irradiation: Secondary | ICD-10-CM | POA: Insufficient documentation

## 2021-12-04 DIAGNOSIS — Z803 Family history of malignant neoplasm of breast: Secondary | ICD-10-CM | POA: Insufficient documentation

## 2021-12-04 DIAGNOSIS — Z79811 Long term (current) use of aromatase inhibitors: Secondary | ICD-10-CM | POA: Insufficient documentation

## 2021-12-04 DIAGNOSIS — Z808 Family history of malignant neoplasm of other organs or systems: Secondary | ICD-10-CM | POA: Insufficient documentation

## 2021-12-04 DIAGNOSIS — C50811 Malignant neoplasm of overlapping sites of right female breast: Secondary | ICD-10-CM

## 2021-12-04 DIAGNOSIS — Z79899 Other long term (current) drug therapy: Secondary | ICD-10-CM | POA: Insufficient documentation

## 2021-12-04 DIAGNOSIS — Z800 Family history of malignant neoplasm of digestive organs: Secondary | ICD-10-CM | POA: Insufficient documentation

## 2021-12-04 DIAGNOSIS — Z888 Allergy status to other drugs, medicaments and biological substances status: Secondary | ICD-10-CM | POA: Insufficient documentation

## 2021-12-04 DIAGNOSIS — Z510 Encounter for antineoplastic radiation therapy: Secondary | ICD-10-CM | POA: Diagnosis not present

## 2021-12-04 DIAGNOSIS — C7951 Secondary malignant neoplasm of bone: Secondary | ICD-10-CM | POA: Insufficient documentation

## 2021-12-04 LAB — CBC
HCT: 35.8 % — ABNORMAL LOW (ref 36.0–46.0)
Hemoglobin: 11.4 g/dL — ABNORMAL LOW (ref 12.0–15.0)
MCH: 28.5 pg (ref 26.0–34.0)
MCHC: 31.8 g/dL (ref 30.0–36.0)
MCV: 89.5 fL (ref 80.0–100.0)
Platelets: 235 10*3/uL (ref 150–400)
RBC: 4 MIL/uL (ref 3.87–5.11)
RDW: 14.1 % (ref 11.5–15.5)
WBC: 4.7 10*3/uL (ref 4.0–10.5)
nRBC: 0 % (ref 0.0–0.2)

## 2021-12-04 LAB — RAD ONC ARIA SESSION SUMMARY
Course Elapsed Days: 17
Plan Fractions Treated to Date: 13
Plan Prescribed Dose Per Fraction: 1.8 Gy
Plan Total Fractions Prescribed: 28
Plan Total Prescribed Dose: 50.4 Gy
Reference Point Dosage Given to Date: 23.4 Gy
Reference Point Session Dosage Given: 1.8 Gy
Session Number: 13

## 2021-12-05 ENCOUNTER — Other Ambulatory Visit: Payer: Self-pay

## 2021-12-05 ENCOUNTER — Ambulatory Visit
Admission: RE | Admit: 2021-12-05 | Discharge: 2021-12-05 | Disposition: A | Discharge status: Home / Self Care | Payer: Managed Care, Other (non HMO) | Source: Ambulatory Visit | Attending: Radiation Oncology | Admitting: Radiation Oncology

## 2021-12-05 DIAGNOSIS — Z510 Encounter for antineoplastic radiation therapy: Secondary | ICD-10-CM | POA: Diagnosis not present

## 2021-12-05 LAB — RAD ONC ARIA SESSION SUMMARY
Course Elapsed Days: 18
Plan Fractions Treated to Date: 14
Plan Prescribed Dose Per Fraction: 1.8 Gy
Plan Total Fractions Prescribed: 28
Plan Total Prescribed Dose: 50.4 Gy
Reference Point Dosage Given to Date: 25.2 Gy
Reference Point Session Dosage Given: 1.8 Gy
Session Number: 14

## 2021-12-08 ENCOUNTER — Inpatient Hospital Stay (HOSPITAL_BASED_OUTPATIENT_CLINIC_OR_DEPARTMENT_OTHER): Payer: Managed Care, Other (non HMO) | Admitting: Oncology

## 2021-12-08 ENCOUNTER — Ambulatory Visit
Admission: RE | Admit: 2021-12-08 | Discharge: 2021-12-08 | Disposition: A | Discharge status: Home / Self Care | Payer: Managed Care, Other (non HMO) | Source: Ambulatory Visit | Attending: Radiation Oncology | Admitting: Radiation Oncology

## 2021-12-08 ENCOUNTER — Telehealth: Payer: Self-pay | Admitting: Pharmacist

## 2021-12-08 ENCOUNTER — Inpatient Hospital Stay: Payer: Managed Care, Other (non HMO)

## 2021-12-08 ENCOUNTER — Encounter: Payer: Self-pay | Admitting: Oncology

## 2021-12-08 ENCOUNTER — Other Ambulatory Visit: Payer: Self-pay

## 2021-12-08 VITALS — BP 112/53 | HR 80 | Temp 97.4°F | Resp 18 | Wt 190.9 lb

## 2021-12-08 DIAGNOSIS — C50911 Malignant neoplasm of unspecified site of right female breast: Secondary | ICD-10-CM | POA: Diagnosis not present

## 2021-12-08 DIAGNOSIS — I890 Lymphedema, not elsewhere classified: Secondary | ICD-10-CM

## 2021-12-08 DIAGNOSIS — Z510 Encounter for antineoplastic radiation therapy: Secondary | ICD-10-CM | POA: Diagnosis not present

## 2021-12-08 DIAGNOSIS — Z170 Estrogen receptor positive status [ER+]: Secondary | ICD-10-CM | POA: Diagnosis not present

## 2021-12-08 LAB — RAD ONC ARIA SESSION SUMMARY
Course Elapsed Days: 21
Plan Fractions Treated to Date: 15
Plan Prescribed Dose Per Fraction: 1.8 Gy
Plan Total Fractions Prescribed: 28
Plan Total Prescribed Dose: 50.4 Gy
Reference Point Dosage Given to Date: 27 Gy
Reference Point Session Dosage Given: 1.8 Gy
Session Number: 15

## 2021-12-08 LAB — COMPREHENSIVE METABOLIC PANEL
ALT: 15 U/L (ref 0–44)
AST: 21 U/L (ref 15–41)
Albumin: 3.7 g/dL (ref 3.5–5.0)
Alkaline Phosphatase: 55 U/L (ref 38–126)
Anion gap: 4 — ABNORMAL LOW (ref 5–15)
BUN: 15 mg/dL (ref 6–20)
CO2: 26 mmol/L (ref 22–32)
Calcium: 9 mg/dL (ref 8.9–10.3)
Chloride: 106 mmol/L (ref 98–111)
Creatinine, Ser: 0.95 mg/dL (ref 0.44–1.00)
GFR, Estimated: >60 mL/min (ref >60)
Glucose, Bld: 116 mg/dL — ABNORMAL HIGH (ref 70–99)
Potassium: 3.8 mmol/L (ref 3.5–5.1)
Sodium: 136 mmol/L (ref 135–145)
Total Bilirubin: 0.5 mg/dL (ref 0.3–1.2)
Total Protein: 6.8 g/dL (ref 6.5–8.1)

## 2021-12-08 LAB — CBC WITH DIFFERENTIAL/PLATELET
Abs Immature Granulocytes: 0.02 10*3/uL (ref 0.00–0.07)
Basophils Absolute: 0 10*3/uL (ref 0.0–0.1)
Basophils Relative: 0 %
Eosinophils Absolute: 0.2 10*3/uL (ref 0.0–0.5)
Eosinophils Relative: 4 %
HCT: 35.2 % — ABNORMAL LOW (ref 36.0–46.0)
Hemoglobin: 11 g/dL — ABNORMAL LOW (ref 12.0–15.0)
Immature Granulocytes: 0 %
Lymphocytes Relative: 16 %
Lymphs Abs: 0.9 10*3/uL (ref 0.7–4.0)
MCH: 27.7 pg (ref 26.0–34.0)
MCHC: 31.3 g/dL (ref 30.0–36.0)
MCV: 88.7 fL (ref 80.0–100.0)
Monocytes Absolute: 0.5 10*3/uL (ref 0.1–1.0)
Monocytes Relative: 10 %
Neutro Abs: 3.9 10*3/uL (ref 1.7–7.7)
Neutrophils Relative %: 70 %
Platelets: 218 10*3/uL (ref 150–400)
RBC: 3.97 MIL/uL (ref 3.87–5.11)
RDW: 14.3 % (ref 11.5–15.5)
WBC: 5.5 10*3/uL (ref 4.0–10.5)
nRBC: 0 % (ref 0.0–0.2)

## 2021-12-08 NOTE — Telephone Encounter (Signed)
Oral Chemotherapy Pharmacist Encounter  Successfully signed the patient up for a copay card for Verzenio.  ID: [id number] BIN: [id number] Group: [id number] PCN: [id number] information will be shared with[facility] when the time comes. I will place a copy of the card to be scanned into patient's chart. Patient was provided with a copy.

## 2021-12-09 ENCOUNTER — Ambulatory Visit: Payer: Managed Care, Other (non HMO) | Admitting: Occupational Therapy

## 2021-12-09 ENCOUNTER — Other Ambulatory Visit: Payer: Self-pay

## 2021-12-09 ENCOUNTER — Ambulatory Visit
Admission: RE | Admit: 2021-12-09 | Discharge: 2021-12-09 | Disposition: A | Discharge status: Home / Self Care | Payer: Managed Care, Other (non HMO) | Source: Ambulatory Visit | Attending: Radiation Oncology | Admitting: Radiation Oncology

## 2021-12-09 ENCOUNTER — Encounter: Payer: Managed Care, Other (non HMO) | Admitting: Occupational Therapy

## 2021-12-09 ENCOUNTER — Encounter: Payer: Self-pay | Admitting: Oncology

## 2021-12-09 DIAGNOSIS — L905 Scar conditions and fibrosis of skin: Secondary | ICD-10-CM

## 2021-12-09 DIAGNOSIS — I972 Postmastectomy lymphedema syndrome: Secondary | ICD-10-CM | POA: Diagnosis not present

## 2021-12-09 DIAGNOSIS — M25611 Stiffness of right shoulder, not elsewhere classified: Secondary | ICD-10-CM

## 2021-12-09 DIAGNOSIS — M6281 Muscle weakness (generalized): Secondary | ICD-10-CM

## 2021-12-09 DIAGNOSIS — Z510 Encounter for antineoplastic radiation therapy: Secondary | ICD-10-CM | POA: Diagnosis not present

## 2021-12-09 LAB — RAD ONC ARIA SESSION SUMMARY
Course Elapsed Days: 22
Plan Fractions Treated to Date: 16
Plan Prescribed Dose Per Fraction: 1.8 Gy
Plan Total Fractions Prescribed: 28
Plan Total Prescribed Dose: 50.4 Gy
Reference Point Dosage Given to Date: 28.8 Gy
Reference Point Session Dosage Given: 1.8 Gy
Session Number: 16

## 2021-12-09 MED ORDER — ANASTROZOLE 1 MG PO TABS: 1.0000 mg | ORAL_TABLET | Freq: Every day | ORAL | 3 refills | Status: DC

## 2021-12-09 NOTE — Therapy (Signed)
[facility] [address], [place], [address] Phone: [phone]   Fax:  [phone]  Occupational Therapy Treatment  Patient Details  Name: LOVENE MARET MRN: [id number] Date of Birth: 1965-10-02 Referring Provider (OT): Adela Glimpse Date: 12/09/2021   OT End of Session - 12/09/21 0856     Visit Number 6    Number of Visits 12    Date for OT Re-Evaluation 11/24/21    OT Start Time 0814    OT Stop Time 0838    OT Time Calculation (min) 24 min    Activity Tolerance Patient tolerated treatment well    Behavior During Therapy [facility] for tasks assessed/performed             Past Medical History:  Diagnosis Date   Anemia    Benign neoplasm of breast 2013   left breast   BRCA negative 2015   Breast cancer ([place])    Cancer ([place])    Family history of bladder cancer    Family history of breast cancer    Family history of malignant neoplasm of breast 2013   Family history of pancreatic cancer    Screening for obesity     Past Surgical History:  Procedure Laterality Date   ABDOMINAL HYSTERECTOMY  2011   partial   BREAST BIOPSY Left 2013   BREAST BIOPSY Right 03/14/2021   Stereo bx-anterior calcs, "coil" clip-path pending   BREAST BIOPSY Right 03/14/2021   stereo bx-calcs, "Ribbon" clip-path pending   breast biopsy Right 03/14/2021   [place] Bx, Axilla, path pending   BREAST BIOPSY Left 04/30/2021   BREAST BIOPSY WITH RADIO FREQUENCY LOCALIZER Right 09/09/2021   RF tag in axilla   BREAST RECONSTRUCTION WITH PLACEMENT OF TISSUE EXPANDER AND FLEX HD (ACELLULAR HYDRATED DERMIS) Right 09/16/2021   Procedure: RIGHT BREAST RECONSTRUCTION WITH PLACEMENT OF TISSUE EXPANDER AND FLEX HD (ACELLULAR HYDRATED DERMIS);  Surgeon: Cindra Presume, MD;  Location: [facility];  Service: Plastics;  Laterality: Right;   BREAST SURGERY Left 10/16/2011   left breast finesse biopsy   COLONOSCOPY WITH PROPOFOL N/A 12/05/2014    Procedure: COLONOSCOPY WITH PROPOFOL;  Surgeon: Christene Lye, MD;  Location: [facility];  Service: Endoscopy;  Laterality: N/A;   DILATION AND CURETTAGE OF UTERUS     IR CV LINE INJECTION  05/06/2021   IR IMAGING GUIDED PORT INSERTION  05/08/2021   PORT-A-CATH REMOVAL  09/16/2021   Procedure: REMOVAL PORT-A-CATH;  Surgeon: Herbert Pun, MD;  Location: [facility];  Service: General;;   PORTACATH PLACEMENT N/A 04/02/2021   Procedure: INSERTION PORT-A-CATH;  Surgeon: Herbert Pun, MD;  Location: [facility];  Service: General;  Laterality: N/A;   SIMPLE MASTECTOMY WITH AXILLARY SENTINEL NODE BIOPSY Right 09/16/2021   Procedure: SIMPLE MASTECTOMY WITH AXILLARY SENTINEL NODE BIOPSY -- RF tag in axillary;  Surgeon: Herbert Pun, MD;  Location: [facility];  Service: General;  Laterality: Right;    There were no vitals filed for this visit.   Subjective Assessment - 12/09/21 0854     Subjective  I have 4 more weeks of radiation.  The last week is going to be more targeted I think.  I wore the day sleeve every day.  And then I wore the ones you cut for me for nighttime.  We will see what your  numbers show    Pertinent History Patient is a 56 year old female with history of right-sided breast cancer s/p right-sided total  mastectomy with immediate reconstruction using tissue expander and Flex HD performed 09/16/2021 by Dr. [name] and Dr. [name].     At time of mastectomy, decision was made for complete axillary lymph node dissection.  Her Chemo-Port was also removed by general surgery.  Patient required multiple JP drains given lymph node dissection.  Expander was filled to a total of 300/475 cc.     Patient was last seen here in the office on 09/24/2021.  At that time, she was no longer requiring any narcotic analgesics..   Plan was for continued activity modifications and compressive garments.  Per pt had 2 fillings in expander since then - maybe getting one more Wed -  returning to work. Had chemo prrior to surgery and having Radiation simulation about june 15th - and then after that having expanders out and replace by implants. Refer to OT for education for lymphedema , pt had increase swelling since after surgery per pt in R upper arm. And limited R shoulder AROM over head - did get order from surgery PA Krista Blue for overhead ROM .    Patient Stated Goals I do not want my right arm to get bigger or have lymphedema and if I can get my motion better that I can use it overhead and I need to be able to get into radiation position.    Currently in Pain? No/denies                 LYMPHEDEMA/ONCOLOGY QUESTIONNAIRE - 12/09/21 0001       Right Upper Extremity Lymphedema   15 cm Proximal to Olecranon Process 35.5 cm    10 cm Proximal to Olecranon Process 35 cm    Olecranon Process 29 cm    15 cm Proximal to Ulnar Styloid Process 28 cm    10 cm Proximal to Ulnar Styloid Process 24.5 cm    Just Proximal to Ulnar Styloid Process 17 cm      Left Upper Extremity Lymphedema   15 cm Proximal to Olecranon Process 34.5 cm    10 cm Proximal to Olecranon Process 33 cm    Olecranon Process 28 cm              Patient arrived today after being a week ago.  At that time patient  right upper extremity circumference increased compared to 5 weeks ago. Patient reported she only wore the sleeve about 2 times to 3 times a week because of the hot weather.   Patient  R UE was increased by 2.5 cm in the upper arm and wrist and elbow 1 cm.  Forearm increased by 2 cm compared to the left. Patient returns today after wearing the last week Tubigrip D and E with Isotoner glove at nighttime as well as a over-the-counter daytime compression sleeve  - Upon assessment of R UE circumference  it decrease. See flow sheet Recommend for patient to continue with daytime and nighttime compression for the next 3 weeks and will reassess in the last week of radiation.   Patient  report her skin is still doing good there is 1 area of sensitivity but no redness or blisters yet.  Also recommend for patient to continue with maintaining her  active range of motion in her shoulder flexion abduction.  As well as scapular retraction.              OT Education - 12/09/21 0856     Education Details Progress and changes to home exercises  Person(s) Educated Patient    Methods Explanation;Demonstration;Tactile cues;Verbal cues;Handout    Comprehension Verbal cues required;Returned demonstration;Verbalized understanding                 OT Long Term Goals - 10/13/21 1425       OT LONG TERM GOAL #1   Title Patient circumference in right upper extremity decreased to close to 1 cm difference compared to the left to be fitted with a compression sleeve for daytime during high risk activity.  To maintain circumference    Baseline Right upper arm increased 2.6 cm compared to the left my elbow 1.5 cm and proximal forearm 1.3 cm increased compared to the left-patient is right-handed but more increased than expected.  No compression at this time    Time 3    Period Weeks    Status New    Target Date 11/03/21      OT LONG TERM GOAL #2   Title Right shoulder active range of motion increase overhead to within normal limits for patient to reach into cabinets and overhead without increased symptoms    Baseline Patient shoulder flexion 96 degrees and abduction 80 degrees with a pull and discomfort in axilla and lateral breast.  Patient has not been doing any motion overhead since now and is patient is tomorrow 4 weeks postop patient is right-hand dominant    Time 6    Period Weeks    Status New    Target Date 11/24/21      OT LONG TERM GOAL #3   Title Patient is a right upper extremity active range of motion and strength improved to within normal limits for patient to pull overhead sweater out as well as takedown more than 4 pounds from top shelf and open and close  heavy doors and refrigerator.    Baseline Patient 4 weeks postop 96 flexion and abduction 80 degrees of right shoulder motion with a pull and discomfort in axilla and lateral breast unable to overhead do range of motion not using hand and activities except for light ADLs.    Time 6    Period Weeks    Status New    Target Date 11/24/21                   Plan - 12/09/21 0857     Clinical Impression Statement Patient presented OT evaluation about 12 weeks post right mastectomy with axillary lymph node dissection, expanders put in at the same time of mastectomy.   Patient referred for lymphedema education for signs and symptoms as well as management.  Patient report at time after for surgery she had swelling in the right upper arm.  At evaluation patient was increase in circumference but was able to decrease it with wearing over-the-counter compression sleeve prior to radiation.  Also progressed through great and active range of motion to be able to get into radiation position.  Patient receiving radiation at the moment.  Patient present for checkup last week with increased circumference in R compare to the L by 2.5 cm in upper arm and 2 cm in forearm . Pt was fitted with tubigrip D and E to wear night time and continue with daytime compression sleeve during day last week and present today with decreased circumference compared to last week.  Patient has 4 more weeks of radiation.  Recommend for patient to continue with same home program for daytime compression sleeve and Tubigrip D and E with Isotoner glove at nighttime.  We will follow-up with patient in 3 to 4 weeks.  Patient also to continue with active assisted range of motion for right shoulder flexion extension as well as scapular retraction.   Patient can benefit from OT services to decrease lymphedema in  right upper extremity and setting her up with a home program to manage her lymphedema during radiation and reconstruction surgery as well  as increased range of motion and strength for overhead activities with the right dominant hand.    OT Occupational Profile and History Problem Focused Assessment - Including review of records relating to presenting problem    Occupational performance deficits (Please refer to evaluation for details): ADL's;IADL's;Work;Play;Leisure;Social Participation    Body Structure / Function / Physical Skills Decreased knowledge of precautions;Flexibility;Scar mobility;ROM;IADL;Edema;UE functional use;Pain;Strength    Rehab Potential Good    Clinical Decision Making Several treatment options, min-mod task modification necessary    Comorbidities Affecting Occupational Performance: None    Modification or Assistance to Complete Evaluation  Min-Moderate modification of tasks or assist with assess necessary to complete eval    OT Frequency Biweekly    OT Duration 4 weeks    OT Treatment/Interventions Self-care/ADL training;Manual Therapy;Patient/family education;Passive range of motion;Therapeutic exercise;Scar mobilization;Manual lymph drainage    Consulted and Agree with Plan of Care Patient             Patient will benefit from skilled therapeutic intervention in order to improve the following deficits and impairments:   Body Structure / Function / Physical Skills: Decreased knowledge of precautions, Flexibility, Scar mobility, ROM, IADL, Edema, UE functional use, Pain, Strength       Visit Diagnosis: Postmastectomy lymphedema syndrome  Stiffness of right shoulder, not elsewhere classified  Muscle weakness (generalized)  Scar tissue    Problem List Patient Active Problem List   Diagnosis Date Noted   Breast cancer ([place]) 09/16/2021   Genetic testing 05/06/2021   Family history of pancreatic cancer 04/23/2021   Family history of bladder cancer 04/23/2021   Carcinoma of breast, estrogen and progesterone receptor positive ([place]) 03/25/2021   Goals of care, counseling/discussion 03/25/2021    Menopause 10/05/2016   Mild renal insufficiency 10/05/2016   Benign neoplasm of breast 10/27/2012   Family history of breast cancer 10/27/2012    Rosalyn Gess, OTR/L,CLT 12/09/2021, 9:02 AM  [facility] [address] [place], [address] Phone: [phone]   Fax:  [phone]  Name: NOHEA KRAS MRN: [id number] Date of Birth: [birth year]

## 2021-12-09 NOTE — Progress Notes (Signed)
Hematology/Oncology Consult note [facility]  Telephone:([phone] [phone]  Patient Care Team: Sindy Guadeloupe, MD as PCP - General (Oncology) Sindy Guadeloupe, MD as Consulting Physician (Hematology and Oncology)   Name of the patient: Emma Cuevas  [id number]  22-Jun-1965   Date of visit: 12/09/21  Diagnosis- clinical prognostic stage IIIa right breast cancer T3 N1 M0 ER/PR positive HER2 negative  Chief complaint/ Reason for visit-routine follow-up of breast cancer  Heme/Onc history:  Patient is a 56 year old female who underwent a routine bilateral screening mammogram in September 2022 which showed calcifications in the right breast and prominent right axillary lymph node concerning for malignancy.  This was followed by diagnostic mammogram and ultrasound.  Mammogram showed extensive suspicious pleomorphic calcifications measuring up to 6.8 cm.  2 abnormal lymph nodes were seen in the right axilla on ultrasound as well.  Both the calcifications and the lymph node was biopsied.  Anterior and posterior end of the calcifications was consistent with invasive mammary carcinoma grade 3.  Lymph node was positive for metastatic carcinoma as well.  ER 91 to 100% positive PR 41 to 50% positive and HER2 negative.  Ki-67 40%   MRI bilateral breasts showed non-mass enhancement involving the upper outer lower outer and upper inner quadrants finding 10.9 x 8.4 x 6.8 cm.  Non-mass enhancement in the left breast spanning 1.6 x 1.3 x 0.8 cm.  6 metastatic right axillary lymph nodes.   CT chest abdomen and pelvis with contrast showed mild right axillary adenopathy compatible with metastatic disease but no evidence of distant metastatic disease.  5 mm left lower lobe nodule.  Bone scan was negative for metastatic disease. Patient completed neoadjuvant chemotherapy with dose dense AC followed by Taxol.  She underwent right mastectomy with immediate reconstruction on  09/16/2021.     Final pathology showedMultiple foci of invasive mammary carcinoma the largest focus being 3 mm with extensive lymphovascular invasion.  Tumor invading this dermis of the skin without skin ulceration.  1 micrometastatic carcinoma in the lymph node and 1 out of 8 metastatic carcinoma in the nonsentinel lymph nodes.  Margins negative.      Interval history-patient is doing well overall and has 4 more weeks of radiation treatment remaining.  She denies any specific complaints at this time other than occasional pain and swelling in her right arm secondary to lymphedema.  She has restarted wearing her lymphedema sleeve at this time  ECOG PS- 0 Pain scale- 0   Review of systems- Review of Systems  Constitutional:  Negative for chills, fever, malaise/fatigue and weight loss.  HENT:  Negative for congestion, ear discharge and nosebleeds.   Eyes:  Negative for blurred vision.  Respiratory:  Negative for cough, hemoptysis, sputum production, shortness of breath and wheezing.   Cardiovascular:  Negative for chest pain, palpitations, orthopnea and claudication.  Gastrointestinal:  Negative for abdominal pain, blood in stool, constipation, diarrhea, heartburn, melena, nausea and vomiting.  Genitourinary:  Negative for dysuria, flank pain, frequency, hematuria and urgency.  Musculoskeletal:  Negative for back pain, joint pain and myalgias.       Right arm pain  Skin:  Negative for rash.  Neurological:  Negative for dizziness, tingling, focal weakness, seizures, weakness and headaches.  Endo/Heme/Allergies:  Does not bruise/bleed easily.  Psychiatric/Behavioral:  Negative for depression and suicidal ideas. The patient does not have insomnia.       Allergies  Allergen Reactions   Tegaderm Ag Mesh [Silver] Rash  Itchy/buring tiny red bump   Tape     Redness and burning     Past Medical History:  Diagnosis Date   Anemia    Benign neoplasm of breast 2013   left breast   BRCA  negative 2015   Breast cancer ([place])    Cancer ([place])    Family history of bladder cancer    Family history of breast cancer    Family history of malignant neoplasm of breast 2013   Family history of pancreatic cancer    Screening for obesity      Past Surgical History:  Procedure Laterality Date   ABDOMINAL HYSTERECTOMY  2011   partial   BREAST BIOPSY Left 2013   BREAST BIOPSY Right 03/14/2021   Stereo bx-anterior calcs, "coil" clip-path pending   BREAST BIOPSY Right 03/14/2021   stereo bx-calcs, "Ribbon" clip-path pending   breast biopsy Right 03/14/2021   [place] Bx, Axilla, path pending   BREAST BIOPSY Left 04/30/2021   BREAST BIOPSY WITH RADIO FREQUENCY LOCALIZER Right 09/09/2021   RF tag in axilla   BREAST RECONSTRUCTION WITH PLACEMENT OF TISSUE EXPANDER AND FLEX HD (ACELLULAR HYDRATED DERMIS) Right 09/16/2021   Procedure: RIGHT BREAST RECONSTRUCTION WITH PLACEMENT OF TISSUE EXPANDER AND FLEX HD (ACELLULAR HYDRATED DERMIS);  Surgeon: Cindra Presume, MD;  Location: [facility];  Service: Plastics;  Laterality: Right;   BREAST SURGERY Left 10/16/2011   left breast finesse biopsy   COLONOSCOPY WITH PROPOFOL N/A 12/05/2014   Procedure: COLONOSCOPY WITH PROPOFOL;  Surgeon: Christene Lye, MD;  Location: [facility];  Service: Endoscopy;  Laterality: N/A;   DILATION AND CURETTAGE OF UTERUS     IR CV LINE INJECTION  05/06/2021   IR IMAGING GUIDED PORT INSERTION  05/08/2021   PORT-A-CATH REMOVAL  09/16/2021   Procedure: REMOVAL PORT-A-CATH;  Surgeon: Herbert Pun, MD;  Location: [facility];  Service: General;;   PORTACATH PLACEMENT N/A 04/02/2021   Procedure: INSERTION PORT-A-CATH;  Surgeon: Herbert Pun, MD;  Location: [facility];  Service: General;  Laterality: N/A;   SIMPLE MASTECTOMY WITH AXILLARY SENTINEL NODE BIOPSY Right 09/16/2021   Procedure: SIMPLE MASTECTOMY WITH AXILLARY SENTINEL NODE BIOPSY -- RF tag in axillary;  Surgeon: Herbert Pun, MD;   Location: [facility];  Service: General;  Laterality: Right;    Social History   Socioeconomic History   Marital status: Married    Spouse name: Legrand Como   Number of children: Not on file   Years of education: Not on file   Highest education level: Not on file  Occupational History   Not on file  Tobacco Use   Smoking status: Never   Smokeless tobacco: Never  Vaping Use   Vaping Use: Never used  Substance and Sexual Activity   Alcohol use: No   Drug use: No   Sexual activity: Not on file  Other Topics Concern   Not on file  Social History Narrative   Married lives at home with husband   Social Determinants of Health   Financial Resource Strain: Not on file  Food Insecurity: Not on file  Transportation Needs: Not on file  Physical Activity: Not on file  Stress: Not on file  Social Connections: Not on file  Intimate Partner Violence: Not on file    Family History  Problem Relation Age of Onset   Breast cancer Mother 69   Bladder Cancer Father 6   Breast cancer Paternal Aunt 41   Pancreatic cancer Paternal Uncle 46   Pancreatic cancer  Maternal Grandfather 73   Cancer Cousin        in ear     Current Outpatient Medications:    acetaminophen (TYLENOL) 500 MG tablet, Take 1,000 mg by mouth every 6 (six) hours as needed for moderate pain., Disp: , Rfl:    anastrozole (ARIMIDEX) 1 MG tablet, Take 1 tablet (1 mg total) by mouth daily., Disp: 30 tablet, Rfl: 3   Calcium Carb-Cholecalciferol 500-10 MG-MCG TABS, Take 1 tablet by mouth every morning., Disp: , Rfl:    prochlorperazine (COMPAZINE) 10 MG tablet, as needed. (Patient not taking: Reported on 12/08/2021), Disp: , Rfl:   Physical exam:  Vitals:   12/08/21 1357  BP: (!) 112/53  Pulse: 80  Resp: 18  Temp: (!) 97.4 F (36.3 C)  SpO2: 100%  Weight: 190 lb 14.4 oz (86.6 kg)   Physical Exam Constitutional:      General: She is not in acute distress. Cardiovascular:     Rate and Rhythm: Normal rate and regular  rhythm.     Heart sounds: Normal heart sounds.  Pulmonary:     Effort: Pulmonary effort is normal.  Musculoskeletal:     Comments: Right arm lymphedema sleeve in place  Skin:    General: Skin is warm and dry.  Neurological:     Mental Status: She is alert and oriented to person, place, and time.         Latest Ref Rng & Units 12/08/2021    1:44 PM  CMP  Glucose 70 - 99 mg/dL 116   BUN 6 - 20 mg/dL 15   Creatinine 0.44 - 1.00 mg/dL 0.95   Sodium 135 - 145 mmol/L 136   Potassium 3.5 - 5.1 mmol/L 3.8   Chloride 98 - 111 mmol/L 106   CO2 22 - 32 mmol/L 26   Calcium 8.9 - 10.3 mg/dL 9.0   Total Protein 6.5 - 8.1 g/dL 6.8   Total Bilirubin 0.3 - 1.2 mg/dL 0.5   Alkaline Phos 38 - 126 U/L 55   AST 15 - 41 U/L 21   ALT 0 - 44 U/L 15       Latest Ref Rng & Units 12/08/2021    1:44 PM  CBC  WBC 4.0 - 10.5 K/uL 5.5   Hemoglobin 12.0 - 15.0 g/dL 11.0   Hematocrit 36.0 - 46.0 % 35.2   Platelets 150 - 400 K/uL 218      Assessment and plan- Patient is a 56 y.o. female with pathological prognostic stage I invasive mammary carcinoma mpT1 apN1 acM0 ER/PR positive HER2 negative.  She is here for following issues  I would like the patient to start Arimidex 4 weeks from now after she completes radiation treatment.  Her baseline bone density scan was normal.  Discussed risks and benefits of Arimidex including all but not limited to hot flashes, mood swings arthralgias and worsening bone health.  Patient knows that she needs to take calcium 1200 mg along with vitamin D 800 international units when she starts her Arimidex.  Ideally patient needs to take Arimidex for 10 years given that she had high risk breast cancer Patient has also been approved for adjuvant Verzenio.  I would like her to finish her radiation treatment.  She also has a trip planned for the first week of September.  I will plan to start Verzenio after that which she will take for 2 years Patient would also benefit from adjuvant  bisphosphonates and I would recommend Zometa every  6 months for 3 years which has proven beneficial in reducing the incidence of skeletal related fractures as well as skeletal metastases and ER positive breast cancer.  We would like to get dental clearance from Dr. Jacelyn Grip.  I will tentatively see her in the second week of September when she will start her Zometa   Visit Diagnosis 1. Lymphedema   2. Carcinoma of right breast, estrogen and progesterone receptor positive ([place])      Dr. Randa Evens, MD, MPH [facility] at [facility] [phone] 12/09/2021 8:23 AM

## 2021-12-10 ENCOUNTER — Other Ambulatory Visit: Payer: Self-pay

## 2021-12-10 ENCOUNTER — Ambulatory Visit
Admission: RE | Admit: 2021-12-10 | Discharge: 2021-12-10 | Disposition: A | Discharge status: Home / Self Care | Payer: Managed Care, Other (non HMO) | Source: Ambulatory Visit | Attending: Radiation Oncology | Admitting: Radiation Oncology

## 2021-12-10 DIAGNOSIS — Z510 Encounter for antineoplastic radiation therapy: Secondary | ICD-10-CM | POA: Diagnosis not present

## 2021-12-10 LAB — RAD ONC ARIA SESSION SUMMARY
Course Elapsed Days: 23
Plan Fractions Treated to Date: 17
Plan Prescribed Dose Per Fraction: 1.8 Gy
Plan Total Fractions Prescribed: 28
Plan Total Prescribed Dose: 50.4 Gy
Reference Point Dosage Given to Date: 30.6 Gy
Reference Point Session Dosage Given: 1.8 Gy
Session Number: 17

## 2021-12-11 ENCOUNTER — Ambulatory Visit
Admission: RE | Admit: 2021-12-11 | Discharge: 2021-12-11 | Disposition: A | Discharge status: Home / Self Care | Payer: Managed Care, Other (non HMO) | Source: Ambulatory Visit | Attending: Radiation Oncology | Admitting: Radiation Oncology

## 2021-12-11 ENCOUNTER — Other Ambulatory Visit: Payer: Self-pay

## 2021-12-11 DIAGNOSIS — Z510 Encounter for antineoplastic radiation therapy: Secondary | ICD-10-CM | POA: Diagnosis not present

## 2021-12-11 LAB — RAD ONC ARIA SESSION SUMMARY
Course Elapsed Days: 24
Plan Fractions Treated to Date: 18
Plan Prescribed Dose Per Fraction: 1.8 Gy
Plan Total Fractions Prescribed: 28
Plan Total Prescribed Dose: 50.4 Gy
Reference Point Dosage Given to Date: 32.4 Gy
Reference Point Session Dosage Given: 1.8 Gy
Session Number: 18

## 2021-12-12 ENCOUNTER — Other Ambulatory Visit: Payer: Self-pay

## 2021-12-12 ENCOUNTER — Ambulatory Visit
Admission: RE | Admit: 2021-12-12 | Discharge: 2021-12-12 | Disposition: A | Discharge status: Home / Self Care | Payer: Managed Care, Other (non HMO) | Source: Ambulatory Visit | Attending: Radiation Oncology | Admitting: Radiation Oncology

## 2021-12-12 DIAGNOSIS — Z510 Encounter for antineoplastic radiation therapy: Secondary | ICD-10-CM | POA: Diagnosis not present

## 2021-12-12 LAB — RAD ONC ARIA SESSION SUMMARY
Course Elapsed Days: 25
Plan Fractions Treated to Date: 19
Plan Prescribed Dose Per Fraction: 1.8 Gy
Plan Total Fractions Prescribed: 28
Plan Total Prescribed Dose: 50.4 Gy
Reference Point Dosage Given to Date: 34.2 Gy
Reference Point Session Dosage Given: 1.8 Gy
Session Number: 19

## 2021-12-15 ENCOUNTER — Other Ambulatory Visit: Payer: Self-pay

## 2021-12-15 ENCOUNTER — Ambulatory Visit
Admission: RE | Admit: 2021-12-15 | Discharge: 2021-12-15 | Disposition: A | Discharge status: Home / Self Care | Payer: Managed Care, Other (non HMO) | Source: Ambulatory Visit | Attending: Radiation Oncology | Admitting: Radiation Oncology

## 2021-12-15 DIAGNOSIS — Z510 Encounter for antineoplastic radiation therapy: Secondary | ICD-10-CM | POA: Diagnosis not present

## 2021-12-15 LAB — RAD ONC ARIA SESSION SUMMARY
Course Elapsed Days: 28
Plan Fractions Treated to Date: 20
Plan Prescribed Dose Per Fraction: 1.8 Gy
Plan Total Fractions Prescribed: 28
Plan Total Prescribed Dose: 50.4 Gy
Reference Point Dosage Given to Date: 36 Gy
Reference Point Session Dosage Given: 1.8 Gy
Session Number: 20

## 2021-12-16 ENCOUNTER — Ambulatory Visit
Admission: RE | Admit: 2021-12-16 | Discharge: 2021-12-16 | Disposition: A | Discharge status: Home / Self Care | Payer: Managed Care, Other (non HMO) | Source: Ambulatory Visit | Attending: Radiation Oncology | Admitting: Radiation Oncology

## 2021-12-16 ENCOUNTER — Other Ambulatory Visit: Payer: Self-pay

## 2021-12-16 DIAGNOSIS — Z510 Encounter for antineoplastic radiation therapy: Secondary | ICD-10-CM | POA: Diagnosis not present

## 2021-12-16 LAB — RAD ONC ARIA SESSION SUMMARY
Course Elapsed Days: 29
Plan Fractions Treated to Date: 21
Plan Prescribed Dose Per Fraction: 1.8 Gy
Plan Total Fractions Prescribed: 28
Plan Total Prescribed Dose: 50.4 Gy
Reference Point Dosage Given to Date: 37.8 Gy
Reference Point Session Dosage Given: 1.8 Gy
Session Number: 21

## 2021-12-17 ENCOUNTER — Ambulatory Visit
Admission: RE | Admit: 2021-12-17 | Discharge: 2021-12-17 | Disposition: A | Discharge status: Home / Self Care | Payer: Managed Care, Other (non HMO) | Source: Ambulatory Visit | Attending: Radiation Oncology | Admitting: Radiation Oncology

## 2021-12-17 ENCOUNTER — Other Ambulatory Visit: Payer: Self-pay

## 2021-12-17 DIAGNOSIS — Z510 Encounter for antineoplastic radiation therapy: Secondary | ICD-10-CM | POA: Diagnosis not present

## 2021-12-17 LAB — RAD ONC ARIA SESSION SUMMARY
Course Elapsed Days: 30
Plan Fractions Treated to Date: 22
Plan Prescribed Dose Per Fraction: 1.8 Gy
Plan Total Fractions Prescribed: 28
Plan Total Prescribed Dose: 50.4 Gy
Reference Point Dosage Given to Date: 39.6 Gy
Reference Point Session Dosage Given: 1.8 Gy
Session Number: 22

## 2021-12-18 ENCOUNTER — Ambulatory Visit
Admission: RE | Admit: 2021-12-18 | Discharge: 2021-12-18 | Disposition: A | Discharge status: Home / Self Care | Payer: Managed Care, Other (non HMO) | Source: Ambulatory Visit | Attending: Radiation Oncology | Admitting: Radiation Oncology

## 2021-12-18 ENCOUNTER — Other Ambulatory Visit: Payer: Self-pay

## 2021-12-18 ENCOUNTER — Inpatient Hospital Stay: Payer: Managed Care, Other (non HMO)

## 2021-12-18 DIAGNOSIS — Z510 Encounter for antineoplastic radiation therapy: Secondary | ICD-10-CM | POA: Diagnosis not present

## 2021-12-18 DIAGNOSIS — C50811 Malignant neoplasm of overlapping sites of right female breast: Secondary | ICD-10-CM

## 2021-12-18 LAB — RAD ONC ARIA SESSION SUMMARY
Course Elapsed Days: 31
Plan Fractions Treated to Date: 23
Plan Prescribed Dose Per Fraction: 1.8 Gy
Plan Total Fractions Prescribed: 28
Plan Total Prescribed Dose: 50.4 Gy
Reference Point Dosage Given to Date: 41.4 Gy
Reference Point Session Dosage Given: 1.8 Gy
Session Number: 23

## 2021-12-18 LAB — CBC
HCT: 36 % (ref 36.0–46.0)
Hemoglobin: 11.3 g/dL — ABNORMAL LOW (ref 12.0–15.0)
MCH: 27.8 pg (ref 26.0–34.0)
MCHC: 31.4 g/dL (ref 30.0–36.0)
MCV: 88.7 fL (ref 80.0–100.0)
Platelets: 211 10*3/uL (ref 150–400)
RBC: 4.06 MIL/uL (ref 3.87–5.11)
RDW: 14.4 % (ref 11.5–15.5)
WBC: 4.1 10*3/uL (ref 4.0–10.5)
nRBC: 0 % (ref 0.0–0.2)

## 2021-12-19 ENCOUNTER — Ambulatory Visit
Admission: RE | Admit: 2021-12-19 | Discharge: 2021-12-19 | Disposition: A | Discharge status: Home / Self Care | Payer: Managed Care, Other (non HMO) | Source: Ambulatory Visit | Attending: Radiation Oncology | Admitting: Radiation Oncology

## 2021-12-19 ENCOUNTER — Other Ambulatory Visit: Payer: Self-pay

## 2021-12-19 DIAGNOSIS — Z510 Encounter for antineoplastic radiation therapy: Secondary | ICD-10-CM | POA: Diagnosis not present

## 2021-12-19 LAB — RAD ONC ARIA SESSION SUMMARY
Course Elapsed Days: 32
Plan Fractions Treated to Date: 24
Plan Prescribed Dose Per Fraction: 1.8 Gy
Plan Total Fractions Prescribed: 28
Plan Total Prescribed Dose: 50.4 Gy
Reference Point Dosage Given to Date: 43.2 Gy
Reference Point Session Dosage Given: 1.8 Gy
Session Number: 24

## 2021-12-22 ENCOUNTER — Other Ambulatory Visit: Payer: Self-pay

## 2021-12-22 ENCOUNTER — Ambulatory Visit
Admission: RE | Admit: 2021-12-22 | Discharge: 2021-12-22 | Disposition: A | Discharge status: Home / Self Care | Payer: Managed Care, Other (non HMO) | Source: Ambulatory Visit | Attending: Radiation Oncology | Admitting: Radiation Oncology

## 2021-12-22 DIAGNOSIS — Z510 Encounter for antineoplastic radiation therapy: Secondary | ICD-10-CM | POA: Diagnosis not present

## 2021-12-22 LAB — RAD ONC ARIA SESSION SUMMARY
Course Elapsed Days: 35
Plan Fractions Treated to Date: 25
Plan Prescribed Dose Per Fraction: 1.8 Gy
Plan Total Fractions Prescribed: 28
Plan Total Prescribed Dose: 50.4 Gy
Reference Point Dosage Given to Date: 45 Gy
Reference Point Session Dosage Given: 1.8 Gy
Session Number: 25

## 2021-12-23 ENCOUNTER — Ambulatory Visit
Admission: RE | Admit: 2021-12-23 | Discharge: 2021-12-23 | Disposition: A | Discharge status: Home / Self Care | Payer: Managed Care, Other (non HMO) | Source: Ambulatory Visit | Attending: Radiation Oncology | Admitting: Radiation Oncology

## 2021-12-23 ENCOUNTER — Other Ambulatory Visit: Payer: Self-pay

## 2021-12-23 DIAGNOSIS — Z800 Family history of malignant neoplasm of digestive organs: Secondary | ICD-10-CM | POA: Diagnosis not present

## 2021-12-23 DIAGNOSIS — Z510 Encounter for antineoplastic radiation therapy: Secondary | ICD-10-CM | POA: Insufficient documentation

## 2021-12-23 DIAGNOSIS — C50911 Malignant neoplasm of unspecified site of right female breast: Secondary | ICD-10-CM | POA: Insufficient documentation

## 2021-12-23 DIAGNOSIS — Z170 Estrogen receptor positive status [ER+]: Secondary | ICD-10-CM | POA: Insufficient documentation

## 2021-12-23 DIAGNOSIS — C50811 Malignant neoplasm of overlapping sites of right female breast: Secondary | ICD-10-CM | POA: Diagnosis present

## 2021-12-23 DIAGNOSIS — Z8052 Family history of malignant neoplasm of bladder: Secondary | ICD-10-CM | POA: Diagnosis not present

## 2021-12-23 DIAGNOSIS — I890 Lymphedema, not elsewhere classified: Secondary | ICD-10-CM | POA: Insufficient documentation

## 2021-12-23 DIAGNOSIS — Z803 Family history of malignant neoplasm of breast: Secondary | ICD-10-CM | POA: Diagnosis not present

## 2021-12-23 DIAGNOSIS — C773 Secondary and unspecified malignant neoplasm of axilla and upper limb lymph nodes: Secondary | ICD-10-CM | POA: Insufficient documentation

## 2021-12-23 DIAGNOSIS — Z79899 Other long term (current) drug therapy: Secondary | ICD-10-CM | POA: Diagnosis not present

## 2021-12-23 LAB — RAD ONC ARIA SESSION SUMMARY
Course Elapsed Days: 36
Plan Fractions Treated to Date: 26
Plan Prescribed Dose Per Fraction: 1.8 Gy
Plan Total Fractions Prescribed: 28
Plan Total Prescribed Dose: 50.4 Gy
Reference Point Dosage Given to Date: 46.8 Gy
Reference Point Session Dosage Given: 1.8 Gy
Session Number: 26

## 2021-12-24 ENCOUNTER — Ambulatory Visit
Admission: RE | Admit: 2021-12-24 | Discharge: 2021-12-24 | Disposition: A | Discharge status: Home / Self Care | Payer: Managed Care, Other (non HMO) | Source: Ambulatory Visit | Attending: Radiation Oncology | Admitting: Radiation Oncology

## 2021-12-24 ENCOUNTER — Other Ambulatory Visit: Payer: Self-pay

## 2021-12-24 DIAGNOSIS — Z510 Encounter for antineoplastic radiation therapy: Secondary | ICD-10-CM | POA: Diagnosis not present

## 2021-12-24 LAB — RAD ONC ARIA SESSION SUMMARY
Course Elapsed Days: 37
Plan Fractions Treated to Date: 27
Plan Prescribed Dose Per Fraction: 1.8 Gy
Plan Total Fractions Prescribed: 28
Plan Total Prescribed Dose: 50.4 Gy
Reference Point Dosage Given to Date: 48.6 Gy
Reference Point Session Dosage Given: 1.8 Gy
Session Number: 27

## 2021-12-25 ENCOUNTER — Ambulatory Visit
Admission: RE | Admit: 2021-12-25 | Discharge: 2021-12-25 | Disposition: A | Discharge status: Home / Self Care | Payer: Managed Care, Other (non HMO) | Source: Ambulatory Visit | Attending: Radiation Oncology | Admitting: Radiation Oncology

## 2021-12-25 ENCOUNTER — Other Ambulatory Visit: Payer: Self-pay

## 2021-12-25 DIAGNOSIS — Z510 Encounter for antineoplastic radiation therapy: Secondary | ICD-10-CM | POA: Diagnosis not present

## 2021-12-25 LAB — RAD ONC ARIA SESSION SUMMARY
Course Elapsed Days: 38
Plan Fractions Treated to Date: 28
Plan Prescribed Dose Per Fraction: 1.8 Gy
Plan Total Fractions Prescribed: 28
Plan Total Prescribed Dose: 50.4 Gy
Reference Point Dosage Given to Date: 50.4 Gy
Reference Point Session Dosage Given: 1.8 Gy
Session Number: 28

## 2021-12-26 ENCOUNTER — Other Ambulatory Visit: Payer: Self-pay

## 2021-12-26 ENCOUNTER — Ambulatory Visit
Admission: RE | Admit: 2021-12-26 | Discharge: 2021-12-26 | Disposition: A | Discharge status: Home / Self Care | Payer: Managed Care, Other (non HMO) | Source: Ambulatory Visit | Attending: Radiation Oncology | Admitting: Radiation Oncology

## 2021-12-26 DIAGNOSIS — Z510 Encounter for antineoplastic radiation therapy: Secondary | ICD-10-CM | POA: Diagnosis not present

## 2021-12-26 LAB — RAD ONC ARIA SESSION SUMMARY
Course Elapsed Days: 39
Plan Fractions Treated to Date: 1
Plan Prescribed Dose Per Fraction: 2 Gy
Plan Total Fractions Prescribed: 8
Plan Total Prescribed Dose: 16 Gy
Reference Point Dosage Given to Date: 52.4 Gy
Reference Point Session Dosage Given: 2 Gy
Session Number: 29

## 2021-12-29 ENCOUNTER — Ambulatory Visit
Admission: RE | Admit: 2021-12-29 | Discharge: 2021-12-29 | Disposition: A | Discharge status: Home / Self Care | Payer: Managed Care, Other (non HMO) | Source: Ambulatory Visit | Attending: Radiation Oncology | Admitting: Radiation Oncology

## 2021-12-29 ENCOUNTER — Other Ambulatory Visit: Payer: Self-pay

## 2021-12-29 DIAGNOSIS — Z510 Encounter for antineoplastic radiation therapy: Secondary | ICD-10-CM | POA: Diagnosis not present

## 2021-12-29 LAB — RAD ONC ARIA SESSION SUMMARY
Course Elapsed Days: 42
Plan Fractions Treated to Date: 1
Plan Prescribed Dose Per Fraction: 2 Gy
Plan Total Fractions Prescribed: 7
Plan Total Prescribed Dose: 14 Gy
Reference Point Dosage Given to Date: 54.4 Gy
Reference Point Session Dosage Given: 2 Gy
Session Number: 30

## 2021-12-30 ENCOUNTER — Ambulatory Visit
Admission: RE | Admit: 2021-12-30 | Discharge: 2021-12-30 | Disposition: A | Discharge status: Home / Self Care | Payer: Managed Care, Other (non HMO) | Source: Ambulatory Visit | Attending: Radiation Oncology | Admitting: Radiation Oncology

## 2021-12-30 ENCOUNTER — Other Ambulatory Visit: Payer: Self-pay | Admitting: General Surgery

## 2021-12-30 ENCOUNTER — Other Ambulatory Visit: Payer: Self-pay

## 2021-12-30 DIAGNOSIS — Z510 Encounter for antineoplastic radiation therapy: Secondary | ICD-10-CM | POA: Diagnosis not present

## 2021-12-30 DIAGNOSIS — Z1231 Encounter for screening mammogram for malignant neoplasm of breast: Secondary | ICD-10-CM

## 2021-12-30 LAB — RAD ONC ARIA SESSION SUMMARY
Course Elapsed Days: 43
Plan Fractions Treated to Date: 2
Plan Prescribed Dose Per Fraction: 2 Gy
Plan Total Fractions Prescribed: 7
Plan Total Prescribed Dose: 14 Gy
Reference Point Dosage Given to Date: 56.4 Gy
Reference Point Session Dosage Given: 2 Gy
Session Number: 31

## 2021-12-31 ENCOUNTER — Other Ambulatory Visit: Payer: Self-pay

## 2021-12-31 ENCOUNTER — Ambulatory Visit
Admission: RE | Admit: 2021-12-31 | Discharge: 2021-12-31 | Disposition: A | Discharge status: Home / Self Care | Payer: Managed Care, Other (non HMO) | Source: Ambulatory Visit | Attending: Radiation Oncology | Admitting: Radiation Oncology

## 2021-12-31 DIAGNOSIS — Z510 Encounter for antineoplastic radiation therapy: Secondary | ICD-10-CM | POA: Diagnosis not present

## 2021-12-31 LAB — RAD ONC ARIA SESSION SUMMARY
Course Elapsed Days: 44
Plan Fractions Treated to Date: 3
Plan Prescribed Dose Per Fraction: 2 Gy
Plan Total Fractions Prescribed: 7
Plan Total Prescribed Dose: 14 Gy
Reference Point Dosage Given to Date: 58.4 Gy
Reference Point Session Dosage Given: 2 Gy
Session Number: 32

## 2022-01-01 ENCOUNTER — Ambulatory Visit: Payer: Managed Care, Other (non HMO) | Attending: Oncology | Admitting: Occupational Therapy

## 2022-01-01 ENCOUNTER — Ambulatory Visit
Admission: RE | Admit: 2022-01-01 | Discharge: 2022-01-01 | Disposition: A | Discharge status: Home / Self Care | Payer: Managed Care, Other (non HMO) | Source: Ambulatory Visit | Attending: Radiation Oncology | Admitting: Radiation Oncology

## 2022-01-01 ENCOUNTER — Other Ambulatory Visit: Payer: Self-pay

## 2022-01-01 ENCOUNTER — Inpatient Hospital Stay: Payer: Managed Care, Other (non HMO) | Attending: Nurse Practitioner

## 2022-01-01 DIAGNOSIS — L905 Scar conditions and fibrosis of skin: Secondary | ICD-10-CM | POA: Diagnosis present

## 2022-01-01 DIAGNOSIS — M6281 Muscle weakness (generalized): Secondary | ICD-10-CM | POA: Diagnosis present

## 2022-01-01 DIAGNOSIS — I972 Postmastectomy lymphedema syndrome: Secondary | ICD-10-CM | POA: Diagnosis present

## 2022-01-01 DIAGNOSIS — Z510 Encounter for antineoplastic radiation therapy: Secondary | ICD-10-CM | POA: Diagnosis not present

## 2022-01-01 DIAGNOSIS — Z8052 Family history of malignant neoplasm of bladder: Secondary | ICD-10-CM | POA: Insufficient documentation

## 2022-01-01 DIAGNOSIS — C773 Secondary and unspecified malignant neoplasm of axilla and upper limb lymph nodes: Secondary | ICD-10-CM | POA: Insufficient documentation

## 2022-01-01 DIAGNOSIS — Z803 Family history of malignant neoplasm of breast: Secondary | ICD-10-CM | POA: Insufficient documentation

## 2022-01-01 DIAGNOSIS — Z170 Estrogen receptor positive status [ER+]: Secondary | ICD-10-CM | POA: Insufficient documentation

## 2022-01-01 DIAGNOSIS — M25611 Stiffness of right shoulder, not elsewhere classified: Secondary | ICD-10-CM | POA: Insufficient documentation

## 2022-01-01 DIAGNOSIS — Z79899 Other long term (current) drug therapy: Secondary | ICD-10-CM | POA: Insufficient documentation

## 2022-01-01 DIAGNOSIS — I890 Lymphedema, not elsewhere classified: Secondary | ICD-10-CM | POA: Insufficient documentation

## 2022-01-01 DIAGNOSIS — C50811 Malignant neoplasm of overlapping sites of right female breast: Secondary | ICD-10-CM | POA: Insufficient documentation

## 2022-01-01 DIAGNOSIS — Z800 Family history of malignant neoplasm of digestive organs: Secondary | ICD-10-CM | POA: Insufficient documentation

## 2022-01-01 LAB — RAD ONC ARIA SESSION SUMMARY
Course Elapsed Days: 45
Plan Fractions Treated to Date: 4
Plan Prescribed Dose Per Fraction: 2 Gy
Plan Total Fractions Prescribed: 7
Plan Total Prescribed Dose: 14 Gy
Reference Point Dosage Given to Date: 60.4 Gy
Reference Point Session Dosage Given: 2 Gy
Session Number: 33

## 2022-01-01 LAB — CBC
HCT: 35.3 % — ABNORMAL LOW (ref 36.0–46.0)
Hemoglobin: 11.4 g/dL — ABNORMAL LOW (ref 12.0–15.0)
MCH: 28.2 pg (ref 26.0–34.0)
MCHC: 32.3 g/dL (ref 30.0–36.0)
MCV: 87.4 fL (ref 80.0–100.0)
Platelets: 203 10*3/uL (ref 150–400)
RBC: 4.04 MIL/uL (ref 3.87–5.11)
RDW: 14.6 % (ref 11.5–15.5)
WBC: 4.4 10*3/uL (ref 4.0–10.5)
nRBC: 0 % (ref 0.0–0.2)

## 2022-01-01 NOTE — Therapy (Signed)
[facility] [address], [place], [address] Phone: [phone]   Fax:  [phone]  Occupational Therapy Treatment  Patient Details  Name: Emma Cuevas MRN: [id number] Date of Birth: 1965-11-21 Referring Provider (OT): Adela Glimpse Date: 01/01/2022   OT End of Session - 01/01/22 1026     Visit Number 7    Number of Visits 12    Date for OT Re-Evaluation 03/26/22    OT Start Time 0950    OT Stop Time 1020    OT Time Calculation (min) 30 min    Activity Tolerance Patient tolerated treatment well    Behavior During Therapy [facility] for tasks assessed/performed             Past Medical History:  Diagnosis Date   Anemia    Benign neoplasm of breast 2013   left breast   BRCA negative 2015   Breast cancer ([place])    Cancer ([place])    Family history of bladder cancer    Family history of breast cancer    Family history of malignant neoplasm of breast 2013   Family history of pancreatic cancer    Screening for obesity     Past Surgical History:  Procedure Laterality Date   ABDOMINAL HYSTERECTOMY  2011   partial   BREAST BIOPSY Left 2013   BREAST BIOPSY Right 03/14/2021   Stereo bx-anterior calcs, "coil" clip-path pending   BREAST BIOPSY Right 03/14/2021   stereo bx-calcs, "Ribbon" clip-path pending   breast biopsy Right 03/14/2021   [place] Bx, Axilla, path pending   BREAST BIOPSY Left 04/30/2021   BREAST BIOPSY WITH RADIO FREQUENCY LOCALIZER Right 09/09/2021   RF tag in axilla   BREAST RECONSTRUCTION WITH PLACEMENT OF TISSUE EXPANDER AND FLEX HD (ACELLULAR HYDRATED DERMIS) Right 09/16/2021   Procedure: RIGHT BREAST RECONSTRUCTION WITH PLACEMENT OF TISSUE EXPANDER AND FLEX HD (ACELLULAR HYDRATED DERMIS);  Surgeon: Cindra Presume, MD;  Location: [facility];  Service: Plastics;  Laterality: Right;   BREAST SURGERY Left 10/16/2011   left breast finesse biopsy   COLONOSCOPY WITH PROPOFOL N/A 12/05/2014    Procedure: COLONOSCOPY WITH PROPOFOL;  Surgeon: Christene Lye, MD;  Location: [facility];  Service: Endoscopy;  Laterality: N/A;   DILATION AND CURETTAGE OF UTERUS     IR CV LINE INJECTION  05/06/2021   IR IMAGING GUIDED PORT INSERTION  05/08/2021   PORT-A-CATH REMOVAL  09/16/2021   Procedure: REMOVAL PORT-A-CATH;  Surgeon: Herbert Pun, MD;  Location: [facility];  Service: General;;   PORTACATH PLACEMENT N/A 04/02/2021   Procedure: INSERTION PORT-A-CATH;  Surgeon: Herbert Pun, MD;  Location: [facility];  Service: General;  Laterality: N/A;   SIMPLE MASTECTOMY WITH AXILLARY SENTINEL NODE BIOPSY Right 09/16/2021   Procedure: SIMPLE MASTECTOMY WITH AXILLARY SENTINEL NODE BIOPSY -- RF tag in axillary;  Surgeon: Herbert Pun, MD;  Location: [facility];  Service: General;  Laterality: Right;    There were no vitals filed for this visit.   Subjective Assessment - 01/01/22 1025     Subjective  I have 3 more sessions of radiation they targeting now mostly scar.  This discomfort but not pain really.  I have been wearing my sleeve for daytime and then the 1 you correct me for nighttime.  But I can see my arm swollen.    Pertinent History Patient is a 56 year old female with history of right-sided breast cancer s/p right-sided total mastectomy with immediate reconstruction  using tissue expander and Flex HD performed 09/16/2021 by Dr. [name] and Dr. [name].     At time of mastectomy, decision was made for complete axillary lymph node dissection.  Her Chemo-Port was also removed by general surgery.  Patient required multiple JP drains given lymph node dissection.  Expander was filled to a total of 300/475 cc.     Patient was last seen here in the office on 09/24/2021.  At that time, she was no longer requiring any narcotic analgesics..   Plan was for continued activity modifications and compressive garments.  Per pt had 2 fillings in expander since then - maybe getting one  more Wed - returning to work. Had chemo prrior to surgery and having Radiation simulation about june 15th - and then after that having expanders out and replace by implants. Refer to OT for education for lymphedema , pt had increase swelling since after surgery per pt in R upper arm. And limited R shoulder AROM over head - did get order from surgery PA Krista Blue for overhead ROM .    Patient Stated Goals I do not want my right arm to get bigger or have lymphedema and if I can get my motion better that I can use it overhead and I need to be able to get into radiation position.    Currently in Pain? No/denies                 LYMPHEDEMA/ONCOLOGY QUESTIONNAIRE - 01/01/22 0001       Right Upper Extremity Lymphedema   15 cm Proximal to Olecranon Process 36.5 cm    10 cm Proximal to Olecranon Process 35.8 cm    Olecranon Process 28.5 cm    15 cm Proximal to Ulnar Styloid Process 28 cm    10 cm Proximal to Ulnar Styloid Process 24.2 cm    Just Proximal to Ulnar Styloid Process 17 cm      Left Upper Extremity Lymphedema   15 cm Proximal to Olecranon Process 34.5 cm    10 cm Proximal to Olecranon Process 32.8 cm    Olecranon Process 27 cm    15 cm Proximal to Ulnar Styloid Process 26.5 cm    10 cm Proximal to Ulnar Styloid Process 23.5 cm    Just Proximal to Ulnar Styloid Process 17 cm              Shoulder flexion and abduction 155.  External rotation 90 Strength 4+/5 in shoulder    Patient reports she has 3-4 sessions of patient left.  Patient's right upper extremity circumference increased last visit as well as today's visit. Patient reported she only wore the sleeve about 2 times to 3 times a week because of the hot weather last time.  Since 3 weeks ago patient is wearing it daily as well as Isotoner glove with a Tubigrip D and E at nighttime    Patient  R UE continues to be increased 2 to 3 cm in upper arm, elbow and forearm compared to the left.. Patient due Tubigrip D  and E for nighttime use for the next month in combination with recommend for patient to continue with daytime    Also recommend for patient to continue with maintaining her  active range of motion in her shoulder flexion , ext rotation and abduction.  As well as scapular retraction. Patient to monitor for thoracic lymphedema and will follow-up with me in a month.  OT Education - 01/01/22 1026     Education Details changes to home program    Person(s) Educated Patient    Methods Explanation;Demonstration;Tactile cues;Verbal cues;Handout    Comprehension Verbal cues required;Returned demonstration;Verbalized understanding                 OT Long Term Goals - 10/13/21 1425       OT LONG TERM GOAL #1   Title Patient circumference in right upper extremity decreased to close to 1 cm difference compared to the left to be fitted with a compression sleeve for daytime during high risk activity.  To maintain circumference    Baseline Right upper arm increased 2.6 cm compared to the left my elbow 1.5 cm and proximal forearm 1.3 cm increased compared to the left-patient is right-handed but more increased than expected.  No compression at this time    Time 3    Period Weeks    Status New    Target Date 11/03/21      OT LONG TERM GOAL #2   Title Right shoulder active range of motion increase overhead to within normal limits for patient to reach into cabinets and overhead without increased symptoms    Baseline Patient shoulder flexion 96 degrees and abduction 80 degrees with a pull and discomfort in axilla and lateral breast.  Patient has not been doing any motion overhead since now and is patient is tomorrow 4 weeks postop patient is right-hand dominant    Time 6    Period Weeks    Status New    Target Date 11/24/21      OT LONG TERM GOAL #3   Title Patient is a right upper extremity active range of motion and strength improved to within normal limits for patient to  pull overhead sweater out as well as takedown more than 4 pounds from top shelf and open and close heavy doors and refrigerator.    Baseline Patient 4 weeks postop 96 flexion and abduction 80 degrees of right shoulder motion with a pull and discomfort in axilla and lateral breast unable to overhead do range of motion not using hand and activities except for light ADLs.    Time 6    Period Weeks    Status New    Target Date 11/24/21                   Plan - 01/01/22 1027     Clinical Impression Statement Patient presented OT evaluation about 12 weeks post right mastectomy with axillary lymph node dissection, expanders put in at the same time of mastectomy.   Patient referred for lymphedema education for signs and symptoms as well as management.  Patient report at time after for surgery she had swelling in the right upper arm.  At evaluation patient was increase in circumference but was able to decrease it with wearing over-the-counter compression sleeve prior to radiation.  Also progressed  great and active range of motion to be able to get into radiation position.  Patient receiving radiation at the moment-has 3-4 sessions left.  Last 2 follow-up appointments including today patient continues to show increase circumference in the right upper arm.. Pt wearing tubigrip D and E  night time since last time and daytime compression sleeve during day.  Assist for any thoracic lymphedema negative at this stage patient educated on monitoring.  Patient's active range of motion continues to be within normal limits with some stiffness tightness in axilla at endrange.  Patient also to continue with active assisted range of motion for right shoulder flexion, abd and ext rotation.   Patient can benefit from OT services to decrease lymphedema in  right upper extremity and setting her up with a home program to manage her lymphedema during /after radiation and reconstruction surgery -while maintaining her range of  motion and strength and right dominant hand.    OT Occupational Profile and History Problem Focused Assessment - Including review of records relating to presenting problem    Occupational performance deficits (Please refer to evaluation for details): ADL's;IADL's;Work;Play;Leisure;Social Participation    Body Structure / Function / Physical Skills Decreased knowledge of precautions;Flexibility;Scar mobility;ROM;IADL;Edema;UE functional use;Pain;Strength    Rehab Potential Good    Clinical Decision Making Several treatment options, min-mod task modification necessary    Comorbidities Affecting Occupational Performance: None    Modification or Assistance to Complete Evaluation  Min-Moderate modification of tasks or assist with assess necessary to complete eval    OT Frequency Biweekly    OT Duration 12 weeks    OT Treatment/Interventions Self-care/ADL training;Manual Therapy;Patient/family education;Passive range of motion;Therapeutic exercise;Scar mobilization;Manual lymph drainage    Consulted and Agree with Plan of Care Patient             Patient will benefit from skilled therapeutic intervention in order to improve the following deficits and impairments:   Body Structure / Function / Physical Skills: Decreased knowledge of precautions, Flexibility, Scar mobility, ROM, IADL, Edema, UE functional use, Pain, Strength       Visit Diagnosis: Postmastectomy lymphedema syndrome  Stiffness of right shoulder, not elsewhere classified  Scar tissue  Muscle weakness (generalized)    Problem List Patient Active Problem List   Diagnosis Date Noted   Breast cancer ([place]) 09/16/2021   Genetic testing 05/06/2021   Family history of pancreatic cancer 04/23/2021   Family history of bladder cancer 04/23/2021   Carcinoma of breast, estrogen and progesterone receptor positive ([place]) 03/25/2021   Goals of care, counseling/discussion 03/25/2021   Menopause 10/05/2016   Mild renal insufficiency  10/05/2016   Benign neoplasm of breast 10/27/2012   Family history of breast cancer 10/27/2012    Rosalyn Gess, OTR/L,CLT 01/01/2022, 10:31 AM  [facility] [address], [place], [address] Phone: [phone]   Fax:  [phone]  Name: Emma Cuevas MRN: [id number] Date of Birth: June 22, 1965

## 2022-01-02 ENCOUNTER — Ambulatory Visit
Admission: RE | Admit: 2022-01-02 | Discharge: 2022-01-02 | Disposition: A | Discharge status: Home / Self Care | Payer: Managed Care, Other (non HMO) | Source: Ambulatory Visit | Attending: Radiation Oncology | Admitting: Radiation Oncology

## 2022-01-02 ENCOUNTER — Other Ambulatory Visit: Payer: Self-pay

## 2022-01-02 DIAGNOSIS — Z510 Encounter for antineoplastic radiation therapy: Secondary | ICD-10-CM | POA: Diagnosis not present

## 2022-01-02 LAB — RAD ONC ARIA SESSION SUMMARY
Course Elapsed Days: 46
Plan Fractions Treated to Date: 5
Plan Prescribed Dose Per Fraction: 2 Gy
Plan Total Fractions Prescribed: 7
Plan Total Prescribed Dose: 14 Gy
Reference Point Dosage Given to Date: 62.4 Gy
Reference Point Session Dosage Given: 2 Gy
Session Number: 34

## 2022-01-05 ENCOUNTER — Ambulatory Visit
Admission: RE | Admit: 2022-01-05 | Discharge: 2022-01-05 | Disposition: A | Discharge status: Home / Self Care | Payer: Managed Care, Other (non HMO) | Source: Ambulatory Visit | Attending: Radiation Oncology | Admitting: Radiation Oncology

## 2022-01-05 ENCOUNTER — Other Ambulatory Visit: Payer: Self-pay

## 2022-01-05 ENCOUNTER — Inpatient Hospital Stay: Payer: Managed Care, Other (non HMO) | Admitting: Pharmacist

## 2022-01-05 DIAGNOSIS — C50911 Malignant neoplasm of unspecified site of right female breast: Secondary | ICD-10-CM

## 2022-01-05 DIAGNOSIS — Z510 Encounter for antineoplastic radiation therapy: Secondary | ICD-10-CM | POA: Diagnosis not present

## 2022-01-05 LAB — RAD ONC ARIA SESSION SUMMARY
Course Elapsed Days: 49
Plan Fractions Treated to Date: 6
Plan Prescribed Dose Per Fraction: 2 Gy
Plan Total Fractions Prescribed: 7
Plan Total Prescribed Dose: 14 Gy
Reference Point Dosage Given to Date: 64.4 Gy
Reference Point Session Dosage Given: 2 Gy
Session Number: 35

## 2022-01-05 MED ORDER — ABEMACICLIB 150 MG PO TABS: 150.0000 mg | ORAL_TABLET | Freq: Two times a day (BID) | ORAL | 0 refills | Status: DC

## 2022-01-05 MED ORDER — PROCHLORPERAZINE MALEATE 10 MG PO TABS: 10.0000 mg | ORAL_TABLET | Freq: Four times a day (QID) | ORAL | 2 refills | Status: DC | PRN

## 2022-01-05 MED ORDER — ONDANSETRON HCL 8 MG PO TABS: 8.0000 mg | ORAL_TABLET | Freq: Three times a day (TID) | ORAL | 2 refills | Status: DC | PRN

## 2022-01-05 NOTE — Progress Notes (Signed)
Emma Cuevas Cuevas  [phone] [phone]  Patient Care Team: Emma Cuevas [place], MD as PCP - General (Oncology) Emma Cuevas [place], MD as Consulting Physician (Hematology and Oncology)   Name of the patient: Emma Cuevas Cuevas  [id number]  Jan 17, 1966   Date of visit: 01/05/22  HPI: Patient is a 56 y.o. female with stage IIIa breast cancer, ER/PR positive, HER2 negative. Patient will be finishing up radiation therapy soon. S/p radiation, Verzenio (abemaciclib) will be added to anstrozole as adjuvant therapy. Abemaciclib will be continue for 2 years or or until disease recurrence, or unacceptable toxicity.  Reason for Consult: Abemacilib oral chemotherapy education.   PAST MEDICAL HISTORY: Past Medical History:  Diagnosis Date   Anemia    Benign neoplasm of breast 2013   left breast   BRCA negative 2015   Breast cancer (Emma Cuevas Cuevas)    Cancer (Emma Cuevas Cuevas)    Family history of bladder cancer    Family history of breast cancer    Family history of malignant neoplasm of breast 2013   Family history of pancreatic cancer    Screening for obesity     HEMATOLOGY/ONCOLOGY HISTORY:  Oncology History  Carcinoma of breast, estrogen and progesterone receptor positive (Emma Cuevas Cuevas)  03/25/2021 Initial Diagnosis   Breast cancer, stage 1, estrogen receptor positive, right ([place])   03/25/2021 Cancer Staging   Staging form: Breast, AJCC 8th Edition - Clinical stage from 03/25/2021: Stage IIIA (cT3, cN1, cM0, G3, ER+, PR+, HER2-) - Signed by Emma Cuevas [place], MD on 03/25/2021 Histologic grading system: 3 grade system   04/07/2021 - 08/22/2021 Chemotherapy   Patient is on Treatment Plan : BREAST NEOADJUVANT DOSE DENSE AC q14d / PACLitaxel q7d      Genetic Testing   Negative genetic testing. No pathogenic variants identified on the [facility] CancerNext-Expanded+RNA panel. The report date is 05/05/2021.  The CancerNext-Expanded + RNAinsight gene panel offered by [employer]  and includes sequencing and rearrangement analysis for the following 77 genes: IP, ALK, APC*, ATM*, AXIN2, BAP1, BARD1, BLM, BMPR1A, BRCA1*, BRCA2*, BRIP1*, CDC73, CDH1*,CDK4, CDKN1B, CDKN2A, CHEK2*, CTNNA1, DICER1, FANCC, FH, FLCN, GALNT12, KIF1B, LZTR1, MAX, MEN1, MET, MLH1*, MSH2*, MSH3, MSH6*, MUTYH*, NBN, NF1*, NF2, NTHL1, PALB2*, PHOX2B, PMS2*, POT1, PRKAR1A, PTCH1, PTEN*, RAD51C*, RAD51D*,RB1, RECQL, RET, SDHA, SDHAF2, SDHB, SDHC, SDHD, SMAD4, SMARCA4, SMARCB1, SMARCE1, STK11, SUFU, TMEM127, TP53*,TSC1, TSC2, VHL and XRCC2 (sequencing and deletion/duplication); EGFR, EGLN1, HOXB13, KIT, MITF, PDGFRA, POLD1 and POLE (sequencing only); EPCAM and GREM1 (deletion/duplication only).   09/26/2021 Cancer Staging   Staging form: Breast, AJCC 8th Edition - Pathologic stage from 09/26/2021: No Stage Recommended (ypT1a, pN1a, cM0, G3, ER+, PR+, HER2-) - Signed by Emma Cuevas [place], MD on 09/26/2021 Stage prefix: Post-therapy Multigene prognostic tests performed: None Histologic grading system: 3 grade system     ALLERGIES:  is allergic to tegaderm ag mesh [silver] and tape.  MEDICATIONS:  Current Outpatient Medications  Medication Sig Dispense Refill   acetaminophen (TYLENOL) 500 MG tablet Take 1,000 mg by mouth every 6 (six) hours as needed for moderate pain.     anastrozole (ARIMIDEX) 1 MG tablet Take 1 tablet (1 mg total) by mouth daily. 30 tablet 3   Calcium Carb-Cholecalciferol 500-10 MG-MCG TABS Take 1 tablet by mouth every morning.     prochlorperazine (COMPAZINE) 10 MG tablet as needed. (Patient not taking: Reported on 12/08/2021)     No current facility-administered medications for this visit.    VITAL SIGNS: There were no vitals taken for this visit.  There were no vitals filed for this visit.  Estimated body mass index is 31.77 kg/m as calculated from the following:   Height as of 09/16/21: _0  (1.651 m).   Weight as of 12/08/21: 86.6 kg (190 lb 14.4 oz).  LABS: CBC:    Component Value  Date/Time   WBC 4.4 01/01/2022 0845   HGB 11.4 (L) 01/01/2022 0845   HCT 35.3 (L) 01/01/2022 0845   PLT 203 01/01/2022 0845   MCV 87.4 01/01/2022 0845   NEUTROABS 3.9 12/08/2021 1344   LYMPHSABS 0.9 12/08/2021 1344   MONOABS 0.5 12/08/2021 1344   EOSABS 0.2 12/08/2021 1344   BASOSABS 0.0 12/08/2021 1344   Comprehensive Metabolic Panel:    Component Value Date/Time   NA 136 12/08/2021 1344   K 3.8 12/08/2021 1344   CL 106 12/08/2021 1344   CO2 26 12/08/2021 1344   BUN 15 12/08/2021 1344   CREATININE 0.95 12/08/2021 1344   GLUCOSE 116 (H) 12/08/2021 1344   CALCIUM 9.0 12/08/2021 1344   AST 21 12/08/2021 1344   ALT 15 12/08/2021 1344   ALKPHOS 55 12/08/2021 1344   BILITOT 0.5 12/08/2021 1344   PROT 6.8 12/08/2021 1344   ALBUMIN 3.7 12/08/2021 1344     Present during today's visit: patient only  Start plan: Patient will start on 02/02/22 following her MD office visit    Patient Education I spoke with patient for overview of new oral chemotherapy medication: abemaciclib   CMP from 12/08/21 and CBC from 01/01/22 assessed, no relevant lab abnormalities. Prescription dose and frequency assessed.   Administration: Counseled patient on administration, dosing, side effects, monitoring, drug-food interactions, safe handling, storage, and disposal. Patient will take 1 tablet (150 mg total) by mouth 2 (two) times daily.  Side Effects: Side effects include but not limited to: diarrhea, nausea, decreased wbc/hgb/plt. Diarrhea: reviewed the high incidence of diarrhea with abemaciclib. Recommended she keep loperamide on hand and use as needed. She knows to call if she is having 4 or more loose stools Nausea: Anti-emetics sent to her local pharmacy. Reviewed the recommendation to take the ondansetron scheduled 30-60 mins prior to the abemacicilib in the case of frequent nausea with prn use.   Drug-drug Interactions (DDI): No current DDIs with abemacicilib  Adherence: After discussion  with patient no patient barriers to medication adherence identified.  Reviewed with patient importance of keeping a medication schedule and plan for any missed doses.  Ms. Emma Cuevas Cuevas voiced understanding and appreciation. All questions answered. Medication handout provided.  Provided patient with [facility] phone number. Patient knows to call the office with questions or concerns. [facility] will continue to follow.  Patient expressed understanding and was in agreement with this plan. She also understands that She can call clinic at any time with any questions, concerns, or complaints.   Medication Access Issues: Patient required to use [facility]. Rx sent to [facility] and supportive information faxed in. Copay card information included in information faxed in.   Follow-up plan: RTC on 02/02/22 for MD visit  Thank you for allowing me to participate in the care of this patient.   Time Total: 30 mins  Visit consisted of counseling and education on dealing with issues of symptom management in the setting of serious and potentially life-threatening illness.Greater than 50%  of this time was spent counseling and coordinating care related to the above assessment and plan.  Signed by: Darl Pikes, PharmD, BCPS, BCOP, CPP Hematology/Oncology  Clinical Pharmacist Practitioner [place]/DB/AP [facility] [phone]  01/05/2022 10:00 AM

## 2022-01-06 ENCOUNTER — Other Ambulatory Visit: Payer: Self-pay

## 2022-01-06 ENCOUNTER — Ambulatory Visit
Admission: RE | Admit: 2022-01-06 | Discharge: 2022-01-06 | Disposition: A | Discharge status: Home / Self Care | Payer: Managed Care, Other (non HMO) | Source: Ambulatory Visit | Attending: Radiation Oncology | Admitting: Radiation Oncology

## 2022-01-06 DIAGNOSIS — Z510 Encounter for antineoplastic radiation therapy: Secondary | ICD-10-CM | POA: Diagnosis not present

## 2022-01-06 LAB — RAD ONC ARIA SESSION SUMMARY
Course Elapsed Days: 50
Plan Fractions Treated to Date: 7
Plan Prescribed Dose Per Fraction: 2 Gy
Plan Total Fractions Prescribed: 7
Plan Total Prescribed Dose: 14 Gy
Reference Point Dosage Given to Date: 66.4 Gy
Reference Point Session Dosage Given: 2 Gy
Session Number: 36

## 2022-01-16 ENCOUNTER — Other Ambulatory Visit: Payer: Self-pay | Admitting: *Deleted

## 2022-01-16 MED ORDER — ANASTROZOLE 1 MG PO TABS: 1.0000 mg | ORAL_TABLET | Freq: Every day | ORAL | 0 refills | Status: DC

## 2022-02-02 ENCOUNTER — Inpatient Hospital Stay (HOSPITAL_BASED_OUTPATIENT_CLINIC_OR_DEPARTMENT_OTHER): Payer: Managed Care, Other (non HMO) | Admitting: Oncology

## 2022-02-02 ENCOUNTER — Inpatient Hospital Stay: Payer: Managed Care, Other (non HMO)

## 2022-02-02 ENCOUNTER — Ambulatory Visit
Admission: RE | Admit: 2022-02-02 | Discharge: 2022-02-02 | Disposition: A | Discharge status: Home / Self Care | Payer: Managed Care, Other (non HMO) | Source: Ambulatory Visit | Attending: Radiation Oncology | Admitting: Radiation Oncology

## 2022-02-02 ENCOUNTER — Inpatient Hospital Stay: Payer: Managed Care, Other (non HMO) | Attending: Oncology

## 2022-02-02 ENCOUNTER — Encounter: Payer: Self-pay | Admitting: Radiation Oncology

## 2022-02-02 ENCOUNTER — Encounter: Payer: Self-pay | Admitting: Oncology

## 2022-02-02 VITALS — BP 131/71 | HR 83 | Temp 98.7°F | Ht 65.5 in | Wt 190.0 lb

## 2022-02-02 VITALS — BP 113/71 | HR 83 | Temp 98.7°F | Resp 16 | Ht 65.5 in | Wt 190.0 lb

## 2022-02-02 DIAGNOSIS — Z170 Estrogen receptor positive status [ER+]: Secondary | ICD-10-CM

## 2022-02-02 DIAGNOSIS — Z803 Family history of malignant neoplasm of breast: Secondary | ICD-10-CM | POA: Insufficient documentation

## 2022-02-02 DIAGNOSIS — Z800 Family history of malignant neoplasm of digestive organs: Secondary | ICD-10-CM | POA: Insufficient documentation

## 2022-02-02 DIAGNOSIS — Z7983 Long term (current) use of bisphosphonates: Secondary | ICD-10-CM

## 2022-02-02 DIAGNOSIS — I890 Lymphedema, not elsewhere classified: Secondary | ICD-10-CM | POA: Diagnosis not present

## 2022-02-02 DIAGNOSIS — Z79899 Other long term (current) drug therapy: Secondary | ICD-10-CM | POA: Insufficient documentation

## 2022-02-02 DIAGNOSIS — Z79811 Long term (current) use of aromatase inhibitors: Secondary | ICD-10-CM

## 2022-02-02 DIAGNOSIS — Z808 Family history of malignant neoplasm of other organs or systems: Secondary | ICD-10-CM | POA: Diagnosis not present

## 2022-02-02 DIAGNOSIS — Z888 Allergy status to other drugs, medicaments and biological substances status: Secondary | ICD-10-CM | POA: Diagnosis not present

## 2022-02-02 DIAGNOSIS — C50011 Malignant neoplasm of nipple and areola, right female breast: Secondary | ICD-10-CM

## 2022-02-02 DIAGNOSIS — Z923 Personal history of irradiation: Secondary | ICD-10-CM | POA: Insufficient documentation

## 2022-02-02 DIAGNOSIS — Z5181 Encounter for therapeutic drug level monitoring: Secondary | ICD-10-CM | POA: Diagnosis not present

## 2022-02-02 DIAGNOSIS — Z8052 Family history of malignant neoplasm of bladder: Secondary | ICD-10-CM | POA: Diagnosis not present

## 2022-02-02 DIAGNOSIS — Z9221 Personal history of antineoplastic chemotherapy: Secondary | ICD-10-CM | POA: Insufficient documentation

## 2022-02-02 DIAGNOSIS — C50911 Malignant neoplasm of unspecified site of right female breast: Secondary | ICD-10-CM | POA: Diagnosis not present

## 2022-02-02 DIAGNOSIS — C773 Secondary and unspecified malignant neoplasm of axilla and upper limb lymph nodes: Secondary | ICD-10-CM | POA: Insufficient documentation

## 2022-02-02 DIAGNOSIS — C50811 Malignant neoplasm of overlapping sites of right female breast: Secondary | ICD-10-CM | POA: Insufficient documentation

## 2022-02-02 DIAGNOSIS — Z86018 Personal history of other benign neoplasm: Secondary | ICD-10-CM | POA: Insufficient documentation

## 2022-02-02 LAB — COMPREHENSIVE METABOLIC PANEL
ALT: 15 U/L (ref 0–44)
AST: 21 U/L (ref 15–41)
Albumin: 3.8 g/dL (ref 3.5–5.0)
Alkaline Phosphatase: 52 U/L (ref 38–126)
Anion gap: 5 (ref 5–15)
BUN: 17 mg/dL (ref 6–20)
CO2: 29 mmol/L (ref 22–32)
Calcium: 9.4 mg/dL (ref 8.9–10.3)
Chloride: 105 mmol/L (ref 98–111)
Creatinine, Ser: 0.82 mg/dL (ref 0.44–1.00)
GFR, Estimated: >60 mL/min (ref >60)
Glucose, Bld: 104 mg/dL — ABNORMAL HIGH (ref 70–99)
Potassium: 3.7 mmol/L (ref 3.5–5.1)
Sodium: 139 mmol/L (ref 135–145)
Total Bilirubin: 0.4 mg/dL (ref 0.3–1.2)
Total Protein: 7 g/dL (ref 6.5–8.1)

## 2022-02-02 LAB — CBC WITH DIFFERENTIAL/PLATELET
Abs Immature Granulocytes: 0 10*3/uL (ref 0.00–0.07)
Basophils Absolute: 0 10*3/uL (ref 0.0–0.1)
Basophils Relative: 1 %
Eosinophils Absolute: 0.2 10*3/uL (ref 0.0–0.5)
Eosinophils Relative: 5 %
HCT: 35.4 % — ABNORMAL LOW (ref 36.0–46.0)
Hemoglobin: 11.3 g/dL — ABNORMAL LOW (ref 12.0–15.0)
Immature Granulocytes: 0 %
Lymphocytes Relative: 20 %
Lymphs Abs: 0.8 10*3/uL (ref 0.7–4.0)
MCH: 28.2 pg (ref 26.0–34.0)
MCHC: 31.9 g/dL (ref 30.0–36.0)
MCV: 88.3 fL (ref 80.0–100.0)
Monocytes Absolute: 0.4 10*3/uL (ref 0.1–1.0)
Monocytes Relative: 10 %
Neutro Abs: 2.6 10*3/uL (ref 1.7–7.7)
Neutrophils Relative %: 64 %
Platelets: 221 10*3/uL (ref 150–400)
RBC: 4.01 MIL/uL (ref 3.87–5.11)
RDW: 14.5 % (ref 11.5–15.5)
WBC: 4.1 10*3/uL (ref 4.0–10.5)
nRBC: 0 % (ref 0.0–0.2)

## 2022-02-02 MED ORDER — SODIUM CHLORIDE 0.9 % IV SOLN
Freq: Once | INTRAVENOUS | Status: AC
  Filled 2022-02-02: qty 250

## 2022-02-02 MED ORDER — ZOLEDRONIC ACID 4 MG/100ML IV SOLN
4.0000 mg | INTRAVENOUS | Status: DC
  Administered 2022-02-02: 4 mg via INTRAVENOUS
  Filled 2022-02-02: qty 100

## 2022-02-02 NOTE — Progress Notes (Signed)
Hematology/Oncology Consult note [facility]  [phone] [phone]  Patient Care Team: Sindy Guadeloupe, MD as PCP - General (Oncology) Sindy Guadeloupe, MD as Consulting Physician (Hematology and Oncology) Noreene Filbert, MD as Consulting Physician (Radiation Oncology) Herbert Pun, MD as Consulting Physician (General Surgery)   Name of the patient: Emma Cuevas  [id number]  1965/08/24   Date of visit: 02/02/22  Diagnosis- clinical prognostic stage IIIa right breast cancer T3 N1 M0 ER/PR positive HER2 negative    Chief complaint/ Reason for visit-routine follow-up of breast cancer on Arimidex and Verzenio  Heme/Onc history: Patient is a 56 year old female who underwent a routine bilateral screening mammogram in September 2022 which showed calcifications in the right breast and prominent right axillary lymph node concerning for malignancy.  This was followed by diagnostic mammogram and ultrasound.  Mammogram showed extensive suspicious pleomorphic calcifications measuring up to 6.8 cm.  2 abnormal lymph nodes were seen in the right axilla on ultrasound as well.  Both the calcifications and the lymph node was biopsied.  Anterior and posterior end of the calcifications was consistent with invasive mammary carcinoma grade 3.  Lymph node was positive for metastatic carcinoma as well.  ER 91 to 100% positive PR 41 to 50% positive and HER2 negative.  Ki-67 40%   MRI bilateral breasts showed non-mass enhancement involving the upper outer lower outer and upper inner quadrants finding 10.9 x 8.4 x 6.8 cm.  Non-mass enhancement in the left breast spanning 1.6 x 1.3 x 0.8 cm.  6 metastatic right axillary lymph nodes.   CT chest abdomen and pelvis with contrast showed mild right axillary adenopathy compatible with metastatic disease but no evidence of distant metastatic disease.  5 mm left lower lobe nodule.  Bone scan was negative for metastatic  disease. Patient completed neoadjuvant chemotherapy with dose dense AC followed by Taxol.  She underwent right mastectomy with immediate reconstruction on 09/16/2021.     Final pathology showedMultiple foci of invasive mammary carcinoma the largest focus being 3 mm with extensive lymphovascular invasion.  Tumor invading this dermis of the skin without skin ulceration.  1 micrometastatic carcinoma in the lymph node and 1 out of 8 metastatic carcinoma in the nonsentinel lymph nodes.  Margins negative.   Patient completed adjuvant radiation therapy.  Plan is to proceed with Arimidex for 10 years, Verzenio for 2 years and adjuvant Zometa  Interval history-patient recent got back from her beach trip.  She has started taking Arimidex and Verzenio today.  Patient is also following up with Luna Fuse for right upper extremity lymphedema.  Denies any specific concerns at this time  ECOG PS- 0 Pain scale- 0   Review of systems- Review of Systems  Constitutional:  Negative for chills, fever, malaise/fatigue and weight loss.  HENT:  Negative for congestion, ear discharge and nosebleeds.   Eyes:  Negative for blurred vision.  Respiratory:  Negative for cough, hemoptysis, sputum production, shortness of breath and wheezing.   Cardiovascular:  Negative for chest pain, palpitations, orthopnea and claudication.  Gastrointestinal:  Negative for abdominal pain, blood in stool, constipation, diarrhea, heartburn, melena, nausea and vomiting.  Genitourinary:  Negative for dysuria, flank pain, frequency, hematuria and urgency.  Musculoskeletal:  Negative for back pain, joint pain and myalgias.  Skin:  Negative for rash.  Neurological:  Negative for dizziness, tingling, focal weakness, seizures, weakness and headaches.  Endo/Heme/Allergies:  Does not bruise/bleed easily.  Psychiatric/Behavioral:  Negative for depression and suicidal  ideas. The patient does not have insomnia.       Allergies  Allergen  Reactions   Tegaderm Ag Mesh [Silver] Rash    Itchy/buring tiny red bump   Tape     Redness and burning     Past Medical History:  Diagnosis Date   Anemia    Benign neoplasm of breast 2013   left breast   BRCA negative 2015   Breast cancer [place])    Cancer ([place])    Family history of bladder cancer    Family history of breast cancer    Family history of malignant neoplasm of breast 2013   Family history of pancreatic cancer    Screening for obesity      Past Surgical History:  Procedure Laterality Date   ABDOMINAL HYSTERECTOMY  2011   partial   BREAST BIOPSY Left 2013   BREAST BIOPSY Right 03/14/2021   Stereo bx-anterior calcs, "coil" clip-path pending   BREAST BIOPSY Right 03/14/2021   stereo bx-calcs, "Ribbon" clip-path pending   breast biopsy Right 03/14/2021   [place] Bx, Axilla, path pending   BREAST BIOPSY Left 04/30/2021   BREAST BIOPSY WITH RADIO FREQUENCY LOCALIZER Right 09/09/2021   RF tag in axilla   BREAST RECONSTRUCTION WITH PLACEMENT OF TISSUE EXPANDER AND FLEX HD (ACELLULAR HYDRATED DERMIS) Right 09/16/2021   Procedure: RIGHT BREAST RECONSTRUCTION WITH PLACEMENT OF TISSUE EXPANDER AND FLEX HD (ACELLULAR HYDRATED DERMIS);  Surgeon: Cindra Presume, MD;  Location: [facility];  Service: Plastics;  Laterality: Right;   BREAST SURGERY Left 10/16/2011   left breast finesse biopsy   COLONOSCOPY WITH PROPOFOL N/A 12/05/2014   Procedure: COLONOSCOPY WITH PROPOFOL;  Surgeon: Christene Lye, MD;  Location: [facility];  Service: Endoscopy;  Laterality: N/A;   DILATION AND CURETTAGE OF UTERUS     IR CV LINE INJECTION  05/06/2021   IR IMAGING GUIDED PORT INSERTION  05/08/2021   PORT-A-CATH REMOVAL  09/16/2021   Procedure: REMOVAL PORT-A-CATH;  Surgeon: Herbert Pun, MD;  Location: [facility];  Service: General;;   PORTACATH PLACEMENT N/A 04/02/2021   Procedure: INSERTION PORT-A-CATH;  Surgeon: Herbert Pun, MD;  Location: [facility];  Service: General;   Laterality: N/A;   SIMPLE MASTECTOMY WITH AXILLARY SENTINEL NODE BIOPSY Right 09/16/2021   Procedure: SIMPLE MASTECTOMY WITH AXILLARY SENTINEL NODE BIOPSY -- RF tag in axillary;  Surgeon: Herbert Pun, MD;  Location: [facility];  Service: General;  Laterality: Right;    Social History   Socioeconomic History   Marital status: Married    Spouse name: Legrand Como   Number of children: Not on file   Years of education: Not on file   Highest education level: Not on file  Occupational History   Not on file  Tobacco Use   Smoking status: Never   Smokeless tobacco: Never  Vaping Use   Vaping Use: Never used  Substance and Sexual Activity   Alcohol use: No   Drug use: No   Sexual activity: Not on file  Other Topics Concern   Not on file  Social History Narrative   Married lives at home with husband   Social Determinants of Health   Financial Resource Strain: Not on file  Food Insecurity: Not on file  Transportation Needs: Not on file  Physical Activity: Not on file  Stress: Not on file  Social Connections: Not on file  Intimate Partner Violence: Not on file    Family History  Problem Relation Age of Onset   Breast  cancer Mother 32   Bladder Cancer Father 83   Breast cancer Paternal Aunt 33   Pancreatic cancer Paternal Uncle 63   Pancreatic cancer Maternal Grandfather 53   Cancer Cousin        in ear     Current Outpatient Medications:    abemaciclib (VERZENIO) 150 MG tablet, Take 1 tablet (150 mg total) by mouth 2 (two) times daily., Disp: 56 tablet, Rfl: 0   acetaminophen (TYLENOL) 500 MG tablet, Take 1,000 mg by mouth every 6 (six) hours as needed for moderate pain., Disp: , Rfl:    anastrozole (ARIMIDEX) 1 MG tablet, Take 1 tablet (1 mg total) by mouth daily., Disp: 90 tablet, Rfl: 0   Calcium Carb-Cholecalciferol 500-10 MG-MCG TABS, Take 1 tablet by mouth every morning., Disp: , Rfl:    ondansetron (ZOFRAN) 8 MG tablet, Take 1 tablet (8 mg total) by mouth every  8 (eight) hours as needed for nausea or vomiting. (Patient not taking: Reported on 02/02/2022), Disp: 60 tablet, Rfl: 2   prochlorperazine (COMPAZINE) 10 MG tablet, Take 1 tablet (10 mg total) by mouth every 6 (six) hours as needed for nausea or vomiting. (Patient not taking: Reported on 02/02/2022), Disp: 30 tablet, Rfl: 2 No current facility-administered medications for this visit.  Facility-Administered Medications Ordered in Other Visits:    Zoledronic Acid (ZOMETA) IVPB 4 mg, 4 mg, Intravenous, Q6 months, Sindy Guadeloupe, MD, Last Rate: 400 mL/hr at 02/02/22 1049, 4 mg at 02/02/22 1049  Physical exam:  Vitals:   02/02/22 0948  BP: 131/71  Pulse: 83  Temp: 98.7 F (37.1 C)  TempSrc: Tympanic  Weight: 190 lb (86.2 kg)  Height: 5' 5.5" (1.664 m)   Physical Exam Constitutional:      General: She is not in acute distress. Cardiovascular:     Rate and Rhythm: Normal rate and regular rhythm.     Heart sounds: Normal heart sounds.  Pulmonary:     Effort: Pulmonary effort is normal.     Breath sounds: Normal breath sounds.  Abdominal:     General: Bowel sounds are normal.     Palpations: Abdomen is soft.  Musculoskeletal:     Comments: Compression sleeve in place over right upper extremity  Skin:    General: Skin is warm and dry.  Neurological:     Mental Status: She is alert and oriented to person, place, and time.         Latest Ref Rng & Units 02/02/2022    9:31 AM  CMP  Glucose 70 - 99 mg/dL 104   BUN 6 - 20 mg/dL 17   Creatinine 0.44 - 1.00 mg/dL 0.82   Sodium 135 - 145 mmol/L 139   Potassium 3.5 - 5.1 mmol/L 3.7   Chloride 98 - 111 mmol/L 105   CO2 22 - 32 mmol/L 29   Calcium 8.9 - 10.3 mg/dL 9.4   Total Protein 6.5 - 8.1 g/dL 7.0   Total Bilirubin 0.3 - 1.2 mg/dL 0.4   Alkaline Phos 38 - 126 U/L 52   AST 15 - 41 U/L 21   ALT 0 - 44 U/L 15       Latest Ref Rng & Units 02/02/2022    9:31 AM  CBC  WBC 4.0 - 10.5 K/uL 4.1   Hemoglobin 12.0 - 15.0 g/dL 11.3    Hematocrit 36.0 - 46.0 % 35.4   Platelets 150 - 400 K/uL 221      Assessment and plan-  Patient is a 56 y.o. female with pathological prognostic stage I invasive mammary carcinoma mpT1 apN1 acM0 ER/PR positive HER2 negative.  She is here for follow-up of following issues:  Stage I ER positive node positive breast cancer: Patient will start Arimidex along with calcium and vitamin D today.  Baseline bone density scan is normal.  Given that she had node positive disease she also qualifies for adjuvant Verzenio which she will start taking at 150 mg twice daily for 2 years.  Discussed risks and benefits of Verzenio including all but not limited to nausea, vomiting, diarrhea, low blood counts and risk of infections.  Patient understands and agrees to proceed as planned.  2.  Patient will also receive her first dose of Zometa today and I will plan to give her every 6 months for 3 years.  We will obtain dental clearance as well.  I will see her back in 4 weeks with repeat CBC with differential and CMP   Visit Diagnosis 1. Carcinoma of right breast, estrogen and progesterone receptor positive ([place])   2. High risk medication use   3. Visit for monitoring Arimidex therapy   4. Encounter for monitoring zoledronic acid therapy      Dr. Randa Evens, MD, MPH [facility] [phone] 02/02/2022 10:53 AM

## 2022-02-02 NOTE — Progress Notes (Signed)
Survivorship Care Plan visit completed.  Treatment summary reviewed and given to patient.  ASCO answers booklet reviewed and given to patient.  CARE program and Cancer Transitions discussed with patient along with other resources cancer center offers to patients and caregivers.  Patient verbalized understanding.    

## 2022-02-02 NOTE — Progress Notes (Signed)
Radiation Oncology Follow up Note  Name: Emma Cuevas   Date:   02/02/2022 MRN:  [id number] DOB: 11-12-1965    This 56 y.o. female presents to the clinic today for 1 month follow-up status post whole breast radiation to her right breast for stage IIa (T1 aN1 aM0) ER/PR positive invasive mammary carcinoma.  REFERRING PROVIDER: Sindy Guadeloupe, MD  HPI: Patient is a 56 year old female now at 1 month of completed whole breast radiation to her right breast.  And peripheral lymphatic and patient with poor prognostic factors including dermal lymphatic invasion multifocal disease and positive nodes.  She did have breast implant placed.  Seen today in routine follow-up 1 month out she is doing well specifically denies breast tenderness any restrictions around her implant cough or bone pain.  She is currently on Arimidex tolerating it well without side effect.  COMPLICATIONS OF TREATMENT: none  FOLLOW UP COMPLIANCE: keeps appointments   PHYSICAL EXAM:  BP 113/71   Pulse 83   Temp 98.7 F (37.1 C) (Tympanic)   Resp 16   Ht 5' 5.5" (1.664 m) Comment: stated HT  Wt 190 lb (86.2 kg)   BMI 31.14 kg/m  Patient has a breast implant some minor hyperpigmented skin lesions are noted consistent with radiation change.  No dominant masses noted in either breast no at axillary or supraclavicular adenopathy is identified.  Well-developed well-nourished patient in NAD. HEENT reveals PERLA, EOMI, discs not visualized.  Oral cavity is clear. No oral mucosal lesions are identified. Neck is clear without evidence of cervical or supraclavicular adenopathy. Lungs are clear to A&P. Cardiac examination is essentially unremarkable with regular rate and rhythm without murmur rub or thrill. Abdomen is benign with no organomegaly or masses noted. Motor sensory and DTR levels are equal and symmetric in the upper and lower extremities. Cranial nerves II through XII are grossly intact. Proprioception is intact. No peripheral  adenopathy or edema is identified. No motor or sensory levels are noted. Crude visual fields are within normal range.  RADIOLOGY RESULTS: No current films for review  I would like to take this opportunity to thank you for allowing me to participate in the care of your patient.Marland Kitchen  PLAN: present time patient is doing well 1 month out from whole breast and peripheral lymphatic radiation.  And pleased with her overall progress.  She is wearing a sleeve do not know if that is [place] necessary that will be decided by her physical therapy team.  I have asked to see her back in 5 months for follow-up.  Patient is to call at anytime with any concerns.  She continues on Arimidex without side effect.    Noreene Filbert, MD

## 2022-02-02 NOTE — Patient Instructions (Signed)

## 2022-02-03 ENCOUNTER — Encounter: Payer: Self-pay | Admitting: Occupational Therapy

## 2022-02-03 ENCOUNTER — Ambulatory Visit: Payer: Managed Care, Other (non HMO) | Attending: Oncology | Admitting: Occupational Therapy

## 2022-02-03 DIAGNOSIS — M6281 Muscle weakness (generalized): Secondary | ICD-10-CM | POA: Insufficient documentation

## 2022-02-03 DIAGNOSIS — I972 Postmastectomy lymphedema syndrome: Secondary | ICD-10-CM | POA: Insufficient documentation

## 2022-02-03 DIAGNOSIS — M25611 Stiffness of right shoulder, not elsewhere classified: Secondary | ICD-10-CM | POA: Insufficient documentation

## 2022-02-03 DIAGNOSIS — L905 Scar conditions and fibrosis of skin: Secondary | ICD-10-CM | POA: Insufficient documentation

## 2022-02-03 NOTE — Therapy (Signed)
[facility] [address], [place], [address] Phone: [phone]   Fax:  [phone]  Occupational Therapy Treatment  Patient Details  Name: Emma Cuevas MRN: [id number] Date of Birth: 06-26-1965 Referring Provider (OT): Adela Glimpse Date: 02/03/2022   OT End of Session - 02/03/22 0852     Visit Number 8    Number of Visits 12    Date for OT Re-Evaluation 03/26/22    OT Start Time 0824    OT Stop Time 0844    OT Time Calculation (min) 20 min    Activity Tolerance Patient tolerated treatment well    Behavior During Therapy [facility] for tasks assessed/performed             Past Medical History:  Diagnosis Date   Anemia    Benign neoplasm of breast 2013   left breast   BRCA negative 2015   Breast cancer ([place])    Cancer ([place])    Family history of bladder cancer    Family history of breast cancer    Family history of malignant neoplasm of breast 2013   Family history of pancreatic cancer    Screening for obesity     Past Surgical History:  Procedure Laterality Date   ABDOMINAL HYSTERECTOMY  2011   partial   BREAST BIOPSY Left 2013   BREAST BIOPSY Right 03/14/2021   Stereo bx-anterior calcs, "coil" clip-path pending   BREAST BIOPSY Right 03/14/2021   stereo bx-calcs, "Ribbon" clip-path pending   breast biopsy Right 03/14/2021   [place] Bx, Axilla, path pending   BREAST BIOPSY Left 04/30/2021   BREAST BIOPSY WITH RADIO FREQUENCY LOCALIZER Right 09/09/2021   RF tag in axilla   BREAST RECONSTRUCTION WITH PLACEMENT OF TISSUE EXPANDER AND FLEX HD (ACELLULAR HYDRATED DERMIS) Right 09/16/2021   Procedure: RIGHT BREAST RECONSTRUCTION WITH PLACEMENT OF TISSUE EXPANDER AND FLEX HD (ACELLULAR HYDRATED DERMIS);  Surgeon: Cindra Presume, MD;  Location: [facility] ORS;  Service: Plastics;  Laterality: Right;   BREAST SURGERY Left 10/16/2011   left breast finesse biopsy   COLONOSCOPY WITH PROPOFOL N/A 12/05/2014    Procedure: COLONOSCOPY WITH PROPOFOL;  Surgeon: Christene Lye, MD;  Location: [facility] ENDOSCOPY;  Service: Endoscopy;  Laterality: N/A;   DILATION AND CURETTAGE OF UTERUS     IR CV LINE INJECTION  05/06/2021   IR IMAGING GUIDED PORT INSERTION  05/08/2021   PORT-A-CATH REMOVAL  09/16/2021   Procedure: REMOVAL PORT-A-CATH;  Surgeon: Herbert Pun, MD;  Location: [facility] ORS;  Service: General;;   PORTACATH PLACEMENT N/A 04/02/2021   Procedure: INSERTION PORT-A-CATH;  Surgeon: Herbert Pun, MD;  Location: [facility] ORS;  Service: General;  Laterality: N/A;   SIMPLE MASTECTOMY WITH AXILLARY SENTINEL NODE BIOPSY Right 09/16/2021   Procedure: SIMPLE MASTECTOMY WITH AXILLARY SENTINEL NODE BIOPSY -- RF tag in axillary;  Surgeon: Herbert Pun, MD;  Location: [facility] ORS;  Service: General;  Laterality: Right;    There were no vitals filed for this visit.   Subjective Assessment - 02/03/22 0851     Subjective  I am doing very well.  I stopped my radiation doing well and went to the beach for a week.  But it was good about wearing my day sleeve.  In the low compression sleeve she got for me for nighttime.  I can see my swelling is down from what it was.  I am meeting with the surgeon at middle October I think and then  I will have my implant done in November.    Pertinent History Patient is a 56 year old female with history of right-sided breast cancer s/p right-sided total mastectomy with immediate reconstruction using tissue expander and Flex HD performed 09/16/2021 by Dr. [name] and Dr. [name].     At time of mastectomy, decision was made for complete axillary lymph node dissection.  Her Chemo-Port was also removed by general surgery.  Patient required multiple JP drains given lymph node dissection.  Expander was filled to a total of 300/475 cc.     Patient was last seen here in the office on 09/24/2021.  At that time, she was no longer requiring any narcotic analgesics..   Plan was for  continued activity modifications and compressive garments.  Per pt had 2 fillings in expander since then - maybe getting one more Wed - returning to work. Had chemo prrior to surgery and having Radiation simulation about june 15th - and then after that having expanders out and replace by implants. Refer to OT for education for lymphedema , pt had increase swelling since after surgery per pt in R upper arm. And limited R shoulder AROM over head - did get order from surgery PA Krista Blue for overhead ROM .    Patient Stated Goals I do not want my right arm to get bigger or have lymphedema and if I can get my motion better that I can use it overhead and I need to be able to get into radiation position.    Currently in Pain? No/denies                 LYMPHEDEMA/ONCOLOGY QUESTIONNAIRE - 02/03/22 0001       Right Upper Extremity Lymphedema   15 cm Proximal to Olecranon Process 35.5 cm    10 cm Proximal to Olecranon Process 35.2 cm    Olecranon Process 28.2 cm    15 cm Proximal to Ulnar Styloid Process 28 cm    10 cm Proximal to Ulnar Styloid Process 24 cm    Just Proximal to Ulnar Styloid Process 16.5 cm      Left Upper Extremity Lymphedema   15 cm Proximal to Olecranon Process 34.5 cm    10 cm Proximal to Olecranon Process 34 cm    Olecranon Process 28 cm    15 cm Proximal to Ulnar Styloid Process 27 cm    10 cm Proximal to Ulnar Styloid Process 23.5 cm    Just Proximal to Ulnar Styloid Process 17 cm                Shoulder flexion and abduction 155.  External rotation 90 Strength 4+/5 in shoulder     Patient reports she is done with radiation for a few weeks now.  Went to [employer] for a week with her husband and grandson that was great. Had follow-up with Dr. Janese Banks to start her new medication yesterday as well as Dr. Donella Stade.  Patient's right upper extremity circumference decreased compared to the last few times. Patient this past month was good about wearing her day  sleeve every day and Isotoner glove with Tubigrip D and E at nighttime.  Patient  R UE circumference decreased compared to last time and within normal limits now to the left. Patient to continue with compression and home program for about 6 weeks and will follow-up with me prior to reconstruction for switching expanders for implants on the right side.    Patient to continue to maintain  her active range of motion in her shoulder flexion , ext rotation and abduction.  As well as scapular retraction. Patient to monitor for thoracic lymphedema and will follow-up with me in 6 weeks                OT Education - 02/03/22 0852     Education Details changes to home program    Person(s) Educated Patient    Methods Explanation;Demonstration;Tactile cues;Verbal cues;Handout    Comprehension Verbal cues required;Returned demonstration;Verbalized understanding                 OT Long Term Goals - 01/01/22 1838       OT LONG TERM GOAL #1   Title Patient circumference in right upper extremity decreased to close to 1 cm difference compared to the left to be fitted with a compression sleeve for daytime during high risk activity.  To maintain circumference    Baseline Patient wearing compression during the day as well as nighttime Isotoner glove with Tubigrip D and E.  Patient continues to be increase in upper arm recommend patient to continue with compression.    Time 8    Period Weeks    Status On-going    Target Date 02/26/22      OT LONG TERM GOAL #2   Title Right shoulder active range of motion increase overhead to within normal limits for patient to reach into cabinets and overhead without increased symptoms    Baseline Right shoulder active range of motion with an normal limits tightness at end range but doing well.    Status Achieved      OT LONG TERM GOAL #3   Title Patient is a right upper extremity active range of motion and strength improved to within normal limits for  patient to pull overhead sweater out as well as takedown more than 4 pounds from top shelf and open and close heavy doors and refrigerator.    Baseline Patient show active range of motion overhead within functional limits some stiffness and tightness in axilla and anterior chest at endrange.  Some difficulty with overhead lifting and pushing and pulling will reassess after radiation is done.    Time 12    Period Weeks    Status On-going    Target Date 03/26/22                   Plan - 02/03/22 0853     Clinical Impression Statement Patient presented OT evaluation about 12 weeks post right mastectomy with axillary lymph node dissection, expanders put in at the same time of mastectomy.   Patient referred for lymphedema education for signs and symptoms as well as management.  Patient report at time after for surgery she had swelling in the right upper arm.  At evaluation patient was increase in circumference but was able to decrease it with wearing over-the-counter compression sleeve prior to radiation.  Also progressed  great and active range of motion to be able to get into radiation position.  Patient showed during radiation  increase circumference in the right upper arm and did at tubigrip D and E  night time since then to wear at nighttime.   Patient is done with radiation for about 2 to 3 weeks now and came in for a follow-up.  Patient circumference decreased since a month ago and right upper extremity and compared to the left with an normal limits now.  Patient to continue with daytime compression sleeve as well  as Isotoner glove and Tubigrip at nighttime.  Patient to follow-up with me in 6 weeks prior to her expanders being switched for implants..  Patient's active range of motion continues to be within normal limits with some stiffness tightness in axilla at endrange.  Patient  to continue with active assisted range of motion for right shoulder flexion, abd and ext rotation.   Patient can  benefit from OT services to  manage her lymphedema after radiation and prior/during reconstruction surgery -while maintaining her range of motion and strength and right dominant hand.    OT Occupational Profile and History Problem Focused Assessment - Including review of records relating to presenting problem    Occupational performance deficits (Please refer to evaluation for details): ADL's;IADL's;Work;Play;Leisure;Social Participation    Body Structure / Function / Physical Skills Decreased knowledge of precautions;Flexibility;Scar mobility;ROM;IADL;Edema;UE functional use;Pain;Strength    Rehab Potential Good    Clinical Decision Making Several treatment options, min-mod task modification necessary    Comorbidities Affecting Occupational Performance: None    Modification or Assistance to Complete Evaluation  Min-Moderate modification of tasks or assist with assess necessary to complete eval    OT Frequency Monthly    OT Duration 8 weeks    OT Treatment/Interventions Self-care/ADL training;Manual Therapy;Patient/family education;Passive range of motion;Therapeutic exercise;Scar mobilization;Manual lymph drainage    Consulted and Agree with Plan of Care Patient             Patient will benefit from skilled therapeutic intervention in order to improve the following deficits and impairments:   Body Structure / Function / Physical Skills: Decreased knowledge of precautions, Flexibility, Scar mobility, ROM, IADL, Edema, UE functional use, Pain, Strength       Visit Diagnosis: Postmastectomy lymphedema syndrome  Stiffness of right shoulder, not elsewhere classified  Scar tissue  Muscle weakness (generalized)    Problem List Patient Active Problem List   Diagnosis Date Noted   Breast cancer ([place]) 09/16/2021   Genetic testing 05/06/2021   Family history of pancreatic cancer 04/23/2021   Family history of bladder cancer 04/23/2021   Carcinoma of breast, estrogen and progesterone  receptor positive ([place]) 03/25/2021   Goals of care, counseling/discussion 03/25/2021   Menopause 10/05/2016   Mild renal insufficiency 10/05/2016   Benign neoplasm of breast 10/27/2012   Family history of breast cancer 10/27/2012    Rosalyn Gess, OTR/L,CLT 02/03/2022, 8:57 AM  [facility] [address] [place], [address] Phone: [phone]   Fax:  [phone]  Name: Emma Cuevas MRN: [id number] Date of Birth: 01-15-1966

## 2022-02-16 ENCOUNTER — Encounter: Payer: Self-pay | Admitting: Oncology

## 2022-02-16 ENCOUNTER — Other Ambulatory Visit: Payer: Self-pay | Admitting: Pharmacist

## 2022-02-16 DIAGNOSIS — C50011 Malignant neoplasm of nipple and areola, right female breast: Secondary | ICD-10-CM

## 2022-02-16 MED ORDER — DIPHENOXYLATE-ATROPINE 2.5-0.025 MG PO TABS: 2.0000 | ORAL_TABLET | Freq: Four times a day (QID) | ORAL | 0 refills | Status: DC | PRN

## 2022-02-16 NOTE — Telephone Encounter (Signed)
Can you reach out to her? Reduce dose with next cycle

## 2022-02-16 NOTE — Telephone Encounter (Signed)
Oral Chemotherapy Pharmacist Encounter   Spoke with patient, she reports starting the abemaciclib on 02/02/22 and that her diarrhea started on 02/05/22. On average, she reports having 2 loose stools/day and taking 2-4 loperamide tablets/day. She has tried starting her day with a dose of loperamide, but has not noticed  She endorses staying hydrated despite the diarrhea. Prescripton for Lomotil was sent into patient's local pharmacy. She will start taking this instead of loperamide to see if it provides better diarrhea control.   Will call patient on Thursday to check in again. Will consider adjusting the dose if the diarrhea dose not improve.   Darl Pikes, PharmD, BCPS, BCOP, CPP Hematology/Oncology Clinical Pharmacist [place]/DB/AP [facility] [phone]  02/16/2022 3:02 PM

## 2022-02-19 ENCOUNTER — Telehealth: Payer: Self-pay | Admitting: Pharmacist

## 2022-02-19 ENCOUNTER — Ambulatory Visit
Admission: RE | Admit: 2022-02-19 | Discharge: 2022-02-19 | Disposition: A | Discharge status: Home / Self Care | Payer: Managed Care, Other (non HMO) | Source: Ambulatory Visit | Attending: General Surgery | Admitting: General Surgery

## 2022-02-19 ENCOUNTER — Other Ambulatory Visit: Payer: Self-pay | Admitting: Oncology

## 2022-02-19 DIAGNOSIS — Z1231 Encounter for screening mammogram for malignant neoplasm of breast: Secondary | ICD-10-CM | POA: Diagnosis present

## 2022-02-19 DIAGNOSIS — C50911 Malignant neoplasm of unspecified site of right female breast: Secondary | ICD-10-CM

## 2022-02-19 NOTE — Telephone Encounter (Signed)
CBC with Differential/Platelet Order: [id number] Status: Final result    Visible to patient: Yes (seen)    Next appt: Today at 10:20 AM in Radiology [facility] MM GV-1)    Dx: Carcinoma of right breast, estrogen a...    0 Result Notes           Component Ref Range & Units 2 wk ago (02/02/22) 1 mo ago (01/01/22) 2 mo ago (12/18/21) 2 mo ago (12/08/21) 2 mo ago (12/04/21) 3 mo ago (11/20/21) 5 mo ago (09/17/21)  WBC 4.0 - 10.5 K/uL 4.1  4.4  4.1  5.5  4.7  4.9  11.0 High    RBC 3.87 - 5.11 MIL/uL 4.01  4.04  4.06  3.97  4.00  4.03  3.09 Low    Hemoglobin 12.0 - 15.0 g/dL 11.3 Low   11.4 Low   11.3 Low   11.0 Low   11.4 Low   11.4 Low   9.2 Low    HCT 36.0 - 46.0 % 35.4 Low   35.3 Low   36.0  35.2 Low   35.8 Low   36.3  29.3 Low    MCV 80.0 - 100.0 fL 88.3  87.4  88.7  88.7  89.5  90.1  94.8   MCH 26.0 - 34.0 pg 28.2  28.2  27.8  27.7  28.5  28.3  29.8   MCHC 30.0 - 36.0 g/dL 31.9  32.3  31.4  31.3  31.8  31.4  31.4   RDW 11.5 - 15.5 % 14.5  14.6  14.4  14.3  14.1  13.9  13.9   Platelets 150 - 400 K/uL 221  203  211  218  235  237  212   nRBC 0.0 - 0.2 % 0.0  0.0 CM  0.0 CM  0.0  0.0 CM  0.0 CM  0.0 CM   Neutrophils Relative % % 64    70      Neutro Abs 1.7 - 7.7 K/uL 2.6    3.9      Lymphocytes Relative % 20    16      Lymphs Abs 0.7 - 4.0 K/uL 0.8    0.9      Monocytes Relative % 10    10      Monocytes Absolute 0.1 - 1.0 K/uL 0.4    0.5      Eosinophils Relative % 5    4      Eosinophils Absolute 0.0 - 0.5 K/uL 0.2    0.2      Basophils Relative % 1    0      Basophils Absolute 0.0 - 0.1 K/uL 0.0    0.0      Immature Granulocytes % 0    0      Abs Immature Granulocytes 0.00 - 0.07 K/uL 0.00    0.02 CM      Comment: Performed at [facility], [address] [place], [place] [address]  Resulting Agency  [facility] [place] [facility] [place] [facility] [place] [facility] [place] [facility] [place] [facility] [place] [facility]         Specimen Collected: 02/02/22 09:31 Last Resulted: 02/02/22 09:40      Lab Flowsheet     Order Details    View Encounter    Lab and Collection Details    Routing    Result History    View All Conversations on this Encounter  CM=Additional comments      Result Care Coordination   Patient Communication   Add Comments   Seen Back to Top       Other Results from 02/02/2022   Contains abnormal data Comprehensive metabolic panel Order: [id number] Status: Final result    Visible to patient: Yes (seen)    Next appt: Today at 10:20 AM in Radiology [facility] MM GV-1)    Dx: Carcinoma of right breast, estrogen a...    0 Result Notes           Component Ref Range & Units 2 wk ago (02/02/22) 2 mo ago (12/08/21) 5 mo ago (09/17/21) 6 mo ago (08/22/21) 6 mo ago (08/15/21) 6 mo ago (08/08/21) 6 mo ago (08/01/21)  Sodium 135 - 145 mmol/L 139  136  140  135  138  137  137   Potassium 3.5 - 5.1 mmol/L 3.7  3.8  4.2  3.6  3.8  3.8  3.6   Chloride 98 - 111 mmol/L 105  106  108  107  106  108  107   CO2 22 - 32 mmol/L '29  26  27  23  23  22  24   '$ Glucose, Bld 70 - 99 mg/dL 104 High   116 High  CM  135 High  CM  118 High  CM  125 High  CM  120 High  CM  122 High  CM   Comment: Glucose reference range applies only to samples taken after fasting for at least 8 hours.  BUN 6 - 20 mg/dL '17  15  18  13  17  18  14   '$ Creatinine, Ser 0.44 - 1.00 mg/dL 0.82  0.95  0.96  0.75  0.84  0.75  0.67   Calcium 8.9 - 10.3 mg/dL 9.4  9.0  8.9  8.5 Low   9.1  8.9  8.9   Total Protein 6.5 - 8.1 g/dL 7.0  6.8   5.8 Low   6.3 Low   5.9 Low   6.2 Low    Albumin 3.5 - 5.0 g/dL 3.8  3.7   3.4 Low   3.6  3.4 Low   3.4 Low    AST 15 - 41 U/L '21  21   25  '$ 36  26  27   ALT 0 - 44 U/L '15  15   15  21  17  17   '$ Alkaline Phosphatase 38 - 126 U/L 52  55   48  50  48  52   Total Bilirubin 0.3 - 1.2 mg/dL 0.4  0.5   0.3  0.3  0.6  0.5   GFR, Estimated >60 mL/min >60  >60 CM  >60 CM  >60 CM  >60 CM  >60 CM  >60 CM   Comment: (NOTE)  Calculated using the CKD-EPI Creatinine Equation (2021)   Anion gap 5 - '15 5  4  '$ Low  CM  5 CM  5 CM  9 CM  7 CM  6 CM   Comment: Performed at [facility], [address] [place], [place] [address]  Resulting Agency  [facility] [place] [facility] [place] [facility] [place] [facility] [place] [facility] [place] [facility] [place] [facility]         Specimen Collected: 02/02/22 09:31 Last Resulted: 02/02/22 09:58

## 2022-02-19 NOTE — Telephone Encounter (Signed)
Oral Chemotherapy Pharmacist Encounter   Called patient to check in this afternoon to see how she was doing with the recent change to Lomotil for diarrhea management. She reports starting the Lomotil on Tuesday 02/17/22.  02/17/22: She took 4 doses of the Lomotil that day and not having any diarrhea.  02/18/22: She started the day with taking no Lomotil, she has a normal BM in the afternoon and later on one loose stool.  02/19/22 (Today): she took one dose of Lomotil to start the day and so far has had no diarrhea.   She feels that her diarrhea is better controlled with the Lomotil. She will continue both the Lomotil and abemaciclib '150mg'$ .  RTC as planned on 03/02/22. She knows to contact the office if there is a change in her diarrhea control.   Darl Pikes, PharmD, BCPS, BCOP, CPP Hematology/Oncology Clinical Pharmacist [place]/DB/AP [facility] [phone]  02/19/2022 4:05 PM

## 2022-02-25 ENCOUNTER — Telehealth: Payer: Self-pay | Admitting: Plastic Surgery

## 2022-02-25 NOTE — Telephone Encounter (Signed)
LVM and My Chart message to contact office to r/s appt for 10/5 with another provider due to Dr. Erin Hearing leaving practice.

## 2022-02-26 ENCOUNTER — Ambulatory Visit: Payer: Managed Care, Other (non HMO) | Admitting: Plastic Surgery

## 2022-03-02 ENCOUNTER — Encounter: Payer: Self-pay | Admitting: Oncology

## 2022-03-02 ENCOUNTER — Inpatient Hospital Stay: Payer: Managed Care, Other (non HMO) | Admitting: Pharmacist

## 2022-03-02 ENCOUNTER — Inpatient Hospital Stay: Payer: Managed Care, Other (non HMO) | Attending: Nurse Practitioner | Admitting: Oncology

## 2022-03-02 ENCOUNTER — Ambulatory Visit: Payer: Managed Care, Other (non HMO) | Admitting: Plastic Surgery

## 2022-03-02 ENCOUNTER — Inpatient Hospital Stay: Payer: Managed Care, Other (non HMO)

## 2022-03-02 VITALS — BP 100/45 | HR 72 | Temp 97.9°F | Wt 184.5 lb

## 2022-03-02 DIAGNOSIS — T451X5A Adverse effect of antineoplastic and immunosuppressive drugs, initial encounter: Secondary | ICD-10-CM | POA: Insufficient documentation

## 2022-03-02 DIAGNOSIS — Z803 Family history of malignant neoplasm of breast: Secondary | ICD-10-CM | POA: Insufficient documentation

## 2022-03-02 DIAGNOSIS — K521 Toxic gastroenteritis and colitis: Secondary | ICD-10-CM

## 2022-03-02 DIAGNOSIS — Z800 Family history of malignant neoplasm of digestive organs: Secondary | ICD-10-CM | POA: Diagnosis not present

## 2022-03-02 DIAGNOSIS — Z923 Personal history of irradiation: Secondary | ICD-10-CM | POA: Diagnosis not present

## 2022-03-02 DIAGNOSIS — Z888 Allergy status to other drugs, medicaments and biological substances status: Secondary | ICD-10-CM | POA: Diagnosis not present

## 2022-03-02 DIAGNOSIS — Z170 Estrogen receptor positive status [ER+]: Secondary | ICD-10-CM | POA: Diagnosis not present

## 2022-03-02 DIAGNOSIS — Z510 Encounter for antineoplastic radiation therapy: Secondary | ICD-10-CM | POA: Diagnosis present

## 2022-03-02 DIAGNOSIS — D702 Other drug-induced agranulocytosis: Secondary | ICD-10-CM

## 2022-03-02 DIAGNOSIS — Z79811 Long term (current) use of aromatase inhibitors: Secondary | ICD-10-CM | POA: Diagnosis not present

## 2022-03-02 DIAGNOSIS — Z86018 Personal history of other benign neoplasm: Secondary | ICD-10-CM | POA: Diagnosis not present

## 2022-03-02 DIAGNOSIS — Z79899 Other long term (current) drug therapy: Secondary | ICD-10-CM

## 2022-03-02 DIAGNOSIS — Z8052 Family history of malignant neoplasm of bladder: Secondary | ICD-10-CM | POA: Insufficient documentation

## 2022-03-02 DIAGNOSIS — C50911 Malignant neoplasm of unspecified site of right female breast: Secondary | ICD-10-CM

## 2022-03-02 DIAGNOSIS — Z5181 Encounter for therapeutic drug level monitoring: Secondary | ICD-10-CM

## 2022-03-02 DIAGNOSIS — Z9221 Personal history of antineoplastic chemotherapy: Secondary | ICD-10-CM | POA: Diagnosis not present

## 2022-03-02 DIAGNOSIS — C50011 Malignant neoplasm of nipple and areola, right female breast: Secondary | ICD-10-CM

## 2022-03-02 LAB — CBC WITH DIFFERENTIAL/PLATELET
Abs Immature Granulocytes: 0.02 10*3/uL (ref 0.00–0.07)
Basophils Absolute: 0 10*3/uL (ref 0.0–0.1)
Basophils Relative: 1 %
Eosinophils Absolute: 0.1 10*3/uL (ref 0.0–0.5)
Eosinophils Relative: 3 %
HCT: 33.4 % — ABNORMAL LOW (ref 36.0–46.0)
Hemoglobin: 10.9 g/dL — ABNORMAL LOW (ref 12.0–15.0)
Immature Granulocytes: 1 %
Lymphocytes Relative: 29 %
Lymphs Abs: 0.8 10*3/uL (ref 0.7–4.0)
MCH: 28.6 pg (ref 26.0–34.0)
MCHC: 32.6 g/dL (ref 30.0–36.0)
MCV: 87.7 fL (ref 80.0–100.0)
Monocytes Absolute: 0.2 10*3/uL (ref 0.1–1.0)
Monocytes Relative: 7 %
Neutro Abs: 1.6 10*3/uL — ABNORMAL LOW (ref 1.7–7.7)
Neutrophils Relative %: 59 %
Platelets: 165 10*3/uL (ref 150–400)
RBC: 3.81 MIL/uL — ABNORMAL LOW (ref 3.87–5.11)
RDW: 15.2 % (ref 11.5–15.5)
WBC: 2.7 10*3/uL — ABNORMAL LOW (ref 4.0–10.5)
nRBC: 0 % (ref 0.0–0.2)

## 2022-03-02 LAB — COMPREHENSIVE METABOLIC PANEL
ALT: 13 U/L (ref 0–44)
AST: 20 U/L (ref 15–41)
Albumin: 3.7 g/dL (ref 3.5–5.0)
Alkaline Phosphatase: 48 U/L (ref 38–126)
Anion gap: 5 (ref 5–15)
BUN: 12 mg/dL (ref 6–20)
CO2: 27 mmol/L (ref 22–32)
Calcium: 8.9 mg/dL (ref 8.9–10.3)
Chloride: 105 mmol/L (ref 98–111)
Creatinine, Ser: 1.13 mg/dL — ABNORMAL HIGH (ref 0.44–1.00)
GFR, Estimated: 56 mL/min — ABNORMAL LOW (ref >60)
Glucose, Bld: 108 mg/dL — ABNORMAL HIGH (ref 70–99)
Potassium: 3.6 mmol/L (ref 3.5–5.1)
Sodium: 137 mmol/L (ref 135–145)
Total Bilirubin: 0.4 mg/dL (ref 0.3–1.2)
Total Protein: 7 g/dL (ref 6.5–8.1)

## 2022-03-02 MED ORDER — ABEMACICLIB 100 MG PO TABS: 100.0000 mg | ORAL_TABLET | Freq: Two times a day (BID) | ORAL | 2 refills | Status: DC

## 2022-03-02 NOTE — Progress Notes (Signed)
Patient here follow up. Patient complains of Diarrhea x 1 month after starting medication.

## 2022-03-02 NOTE — Progress Notes (Signed)
Hematology/Oncology Consult note [facility]  Telephone:([phone] [phone]  Patient Care Team: Sindy Guadeloupe, MD as PCP - General (Oncology) Sindy Guadeloupe, MD as Consulting Physician (Hematology and Oncology) Noreene Filbert, MD as Consulting Physician (Radiation Oncology) Herbert Pun, MD as Consulting Physician (General Surgery)   Name of the patient: Emma Cuevas  [id number]  25-May-1966   Date of visit: 03/02/22  Diagnosis- clinical prognostic stage IIIa right breast cancer T3 N1 M0 ER/PR positive HER2 negative    Chief complaint/ Reason for visit-routine follow-up of breast cancer on Arimidex and Verzenio  Heme/Onc history: Patient is a 56 year old female who underwent a routine bilateral screening mammogram in September 2022 which showed calcifications in the right breast and prominent right axillary lymph node concerning for malignancy.  This was followed by diagnostic mammogram and ultrasound.  Mammogram showed extensive suspicious pleomorphic calcifications measuring up to 6.8 cm.  2 abnormal lymph nodes were seen in the right axilla on ultrasound as well.  Both the calcifications and the lymph node was biopsied.  Anterior and posterior end of the calcifications was consistent with invasive mammary carcinoma grade 3.  Lymph node was positive for metastatic carcinoma as well.  ER 91 to 100% positive PR 41 to 50% positive and HER2 negative.  Ki-67 40%   MRI bilateral breasts showed non-mass enhancement involving the upper outer lower outer and upper inner quadrants finding 10.9 x 8.4 x 6.8 cm.  Non-mass enhancement in the left breast spanning 1.6 x 1.3 x 0.8 cm.  6 metastatic right axillary lymph nodes.   CT chest abdomen and pelvis with contrast showed mild right axillary adenopathy compatible with metastatic disease but no evidence of distant metastatic disease.  5 mm left lower lobe nodule.  Bone scan was negative for metastatic  disease. Patient completed neoadjuvant chemotherapy with dose dense AC followed by Taxol.  She underwent right mastectomy with immediate reconstruction on 09/16/2021.     Final pathology showedMultiple foci of invasive mammary carcinoma the largest focus being 3 mm with extensive lymphovascular invasion.  Tumor invading this dermis of the skin without skin ulceration.  1 micrometastatic carcinoma in the lymph node and 1 out of 8 metastatic carcinoma in the nonsentinel lymph nodes.  Margins negative.   Patient completed adjuvant radiation therapy.  Plan is to proceed with Arimidex for 10 years, Verzenio for 2 years and adjuvant Zometa    Interval history-patient continues to have on and off diarrhea despite starting Imodium and Lomotil which is affecting her quality of life.  She does also have ongoing fatigue.  ECOG PS- 0 Pain scale- 0   Review of systems- Review of Systems  Constitutional:  Positive for malaise/fatigue. Negative for chills, fever and weight loss.  HENT:  Negative for congestion, ear discharge and nosebleeds.   Eyes:  Negative for blurred vision.  Respiratory:  Negative for cough, hemoptysis, sputum production, shortness of breath and wheezing.   Cardiovascular:  Negative for chest pain, palpitations, orthopnea and claudication.  Gastrointestinal:  Positive for diarrhea. Negative for abdominal pain, blood in stool, constipation, heartburn, melena, nausea and vomiting.  Genitourinary:  Negative for dysuria, flank pain, frequency, hematuria and urgency.  Musculoskeletal:  Negative for back pain, joint pain and myalgias.  Skin:  Negative for rash.  Neurological:  Negative for dizziness, tingling, focal weakness, seizures, weakness and headaches.  Endo/Heme/Allergies:  Does not bruise/bleed easily.  Psychiatric/Behavioral:  Negative for depression and suicidal ideas. The patient does not have  insomnia.       Allergies  Allergen Reactions   Tegaderm Ag Mesh [Silver] Rash     Itchy/buring tiny red bump   Tape     Redness and burning     Past Medical History:  Diagnosis Date   Anemia    Benign neoplasm of breast 2013   left breast   BRCA negative 2015   Breast cancer ([place])    Cancer ([place])    Family history of bladder cancer    Family history of breast cancer    Family history of malignant neoplasm of breast 2013   Family history of pancreatic cancer    Screening for obesity      Past Surgical History:  Procedure Laterality Date   ABDOMINAL HYSTERECTOMY  2011   partial   BREAST BIOPSY Left 2013   BREAST BIOPSY Right 03/14/2021   Stereo bx-anterior calcs, "coil" clip-path pending   BREAST BIOPSY Right 03/14/2021   stereo bx-calcs, "Ribbon" clip-path pending   breast biopsy Right 03/14/2021   [place] Bx, Axilla, path pending   BREAST BIOPSY Left 04/30/2021   BREAST BIOPSY WITH RADIO FREQUENCY LOCALIZER Right 09/09/2021   RF tag in axilla   BREAST RECONSTRUCTION WITH PLACEMENT OF TISSUE EXPANDER AND FLEX HD (ACELLULAR HYDRATED DERMIS) Right 09/16/2021   Procedure: RIGHT BREAST RECONSTRUCTION WITH PLACEMENT OF TISSUE EXPANDER AND FLEX HD (ACELLULAR HYDRATED DERMIS);  Surgeon: Cindra Presume, MD;  Location: [facility];  Service: Plastics;  Laterality: Right;   BREAST SURGERY Left 10/16/2011   left breast finesse biopsy   COLONOSCOPY WITH PROPOFOL N/A 12/05/2014   Procedure: COLONOSCOPY WITH PROPOFOL;  Surgeon: Christene Lye, MD;  Location: [facility];  Service: Endoscopy;  Laterality: N/A;   DILATION AND CURETTAGE OF UTERUS     IR CV LINE INJECTION  05/06/2021   IR IMAGING GUIDED PORT INSERTION  05/08/2021   MASTECTOMY Right    PORT-A-CATH REMOVAL  09/16/2021   Procedure: REMOVAL PORT-A-CATH;  Surgeon: Herbert Pun, MD;  Location: [facility];  Service: General;;   PORTACATH PLACEMENT N/A 04/02/2021   Procedure: INSERTION PORT-A-CATH;  Surgeon: Herbert Pun, MD;  Location: [facility];  Service: General;  Laterality: N/A;    SIMPLE MASTECTOMY WITH AXILLARY SENTINEL NODE BIOPSY Right 09/16/2021   Procedure: SIMPLE MASTECTOMY WITH AXILLARY SENTINEL NODE BIOPSY -- RF tag in axillary;  Surgeon: Herbert Pun, MD;  Location: [facility];  Service: General;  Laterality: Right;    Social History   Socioeconomic History   Marital status: Married    Spouse name: Legrand Como   Number of children: Not on file   Years of education: Not on file   Highest education level: Not on file  Occupational History   Not on file  Tobacco Use   Smoking status: Never   Smokeless tobacco: Never  Vaping Use   Vaping Use: Never used  Substance and Sexual Activity   Alcohol use: No   Drug use: No   Sexual activity: Not on file  Other Topics Concern   Not on file  Social History Narrative   Married lives at home with husband   Social Determinants of Health   Financial Resource Strain: Not on file  Food Insecurity: Not on file  Transportation Needs: Not on file  Physical Activity: Not on file  Stress: Not on file  Social Connections: Not on file  Intimate Partner Violence: Not on file    Family History  Problem Relation Age of Onset   Breast cancer  Mother 45   Bladder Cancer Father 46   Breast cancer Paternal Aunt 91   Pancreatic cancer Paternal Uncle 28   Pancreatic cancer Maternal Grandfather 32   Cancer Cousin        in ear     Current Outpatient Medications:    acetaminophen (TYLENOL) 500 MG tablet, Take 1,000 mg by mouth every 6 (six) hours as needed for moderate pain., Disp: , Rfl:    anastrozole (ARIMIDEX) 1 MG tablet, Take 1 tablet (1 mg total) by mouth daily., Disp: 90 tablet, Rfl: 0   Calcium Carb-Cholecalciferol 500-10 MG-MCG TABS, Take 1 tablet by mouth every morning., Disp: , Rfl:    diphenoxylate-atropine (LOMOTIL) 2.5-0.025 MG tablet, Take 2 tablets by mouth 4 (four) times daily as needed for diarrhea or loose stools., Disp: 60 tablet, Rfl: 0   abemaciclib (VERZENIO) 100 MG tablet, Take 1 tablet  (100 mg total) by mouth 2 (two) times daily., Disp: 56 tablet, Rfl: 2   ondansetron (ZOFRAN) 8 MG tablet, Take 1 tablet (8 mg total) by mouth every 8 (eight) hours as needed for nausea or vomiting. (Patient not taking: Reported on 02/02/2022), Disp: 60 tablet, Rfl: 2   prochlorperazine (COMPAZINE) 10 MG tablet, Take 1 tablet (10 mg total) by mouth every 6 (six) hours as needed for nausea or vomiting. (Patient not taking: Reported on 02/02/2022), Disp: 30 tablet, Rfl: 2  Physical exam:  Vitals:   03/02/22 1001  BP: (!) 100/45  Pulse: 72  Temp: 97.9 F (36.6 C)  TempSrc: Tympanic  Weight: 184 lb 8 oz (83.7 kg)   Physical Exam Constitutional:      General: She is not in acute distress. Cardiovascular:     Rate and Rhythm: Normal rate and regular rhythm.     Heart sounds: Normal heart sounds.  Pulmonary:     Effort: Pulmonary effort is normal.  Musculoskeletal:     Comments: Right upper extremity lymphedema sleeve in place  Skin:    General: Skin is warm and dry.  Neurological:     Mental Status: She is alert and oriented to person, place, and time.         Latest Ref Rng & Units 03/02/2022    9:41 AM  CMP  Glucose 70 - 99 mg/dL 108   BUN 6 - 20 mg/dL 12   Creatinine 0.44 - 1.00 mg/dL 1.13   Sodium 135 - 145 mmol/L 137   Potassium 3.5 - 5.1 mmol/L 3.6   Chloride 98 - 111 mmol/L 105   CO2 22 - 32 mmol/L 27   Calcium 8.9 - 10.3 mg/dL 8.9   Total Protein 6.5 - 8.1 g/dL 7.0   Total Bilirubin 0.3 - 1.2 mg/dL 0.4   Alkaline Phos 38 - 126 U/L 48   AST 15 - 41 U/L 20   ALT 0 - 44 U/L 13       Latest Ref Rng & Units 03/02/2022    9:41 AM  CBC  WBC 4.0 - 10.5 K/uL 2.7   Hemoglobin 12.0 - 15.0 g/dL 10.9   Hematocrit 36.0 - 46.0 % 33.4   Platelets 150 - 400 K/uL 165     No images are attached to the encounter.  MM 3D SCREEN BREAST UNI LEFT  Result Date: 02/19/2022 CLINICAL DATA:  Screening. EXAM: DIGITAL SCREENING UNILATERAL LEFT MAMMOGRAM WITH CAD AND TOMOSYNTHESIS  TECHNIQUE: Left screening digital craniocaudal and mediolateral oblique mammograms were obtained. Left screening digital breast tomosynthesis was performed. The images were  evaluated with computer-aided detection. COMPARISON:  Previous exam(s). ACR Breast Density Category b: There are scattered areas of fibroglandular density. FINDINGS: The patient has had a right mastectomy. There are no findings suspicious for malignancy. IMPRESSION: No mammographic evidence of malignancy. A result letter of this screening mammogram will be mailed directly to the patient. RECOMMENDATION: Screening mammogram in one year.  (Code:SM-L-61M) BI-RADS CATEGORY  1: Negative. Electronically Signed   By: Dorise Bullion III M.D.   On: 02/19/2022 20:07     Assessment and plan- Patient is a 56 y.o. female with pathological prognostic stage I invasive mammary carcinoma mpT1 apN1 acM0 ER/PR positive HER2 negative.  She is here for routine follow-up  Stage I ER positive node positive breast cancer: S/p neoadjuvant chemotherapy followed by surgery and adjuvant radiation treatment.  She is currently on Arimidex along with calcium and vitamin D.  Given that she had residual node positive disease patient was not started on Verzenio at present at a dose 150 mg twice daily.  She is having ongoing diarrhea which remains to be an issue despite Imodium and Lomotil.  Her white cell count is down to 2.7 with an ANC of 1.6.  I will therefore have her hold her Verzenio at this time and will be given a new prescription for 100 mg twice daily and see if she can tolerate that better.  CBC with differential in 4 weeks and 8 weeks and I will see her back in 8 weeks.  Roosvelt Harps from our oral pharmacy will also reach out to the patient in 4 weeks and see how she is tolerating the regimen   Visit Diagnosis 1. Malignant neoplasm of nipple of right breast in female, unspecified estrogen receptor status ([place])   2. High risk medication use   3. Visit  for monitoring Arimidex therapy   4. Drug-induced diarrhea   5. Drug-induced neutropenia ([place])      Dr. Randa Evens, MD, MPH [facility] at [facility] [phone] 03/02/2022 12:53 PM

## 2022-03-02 NOTE — Progress Notes (Signed)
Patient not seen by CPP today, but due to diarrhea Dr. Janese Banks would like to have Ms. Diesel hold her abemaciclib for a week then resume at the reduced dose of '100mg'$  bid.  Dose reduced Rx sent to [facility].

## 2022-03-03 ENCOUNTER — Other Ambulatory Visit: Payer: Self-pay | Admitting: Pharmacist

## 2022-03-03 DIAGNOSIS — C50011 Malignant neoplasm of nipple and areola, right female breast: Secondary | ICD-10-CM

## 2022-03-03 MED ORDER — DIPHENOXYLATE-ATROPINE 2.5-0.025 MG PO TABS: 2.0000 | ORAL_TABLET | Freq: Four times a day (QID) | ORAL | 0 refills | Status: AC | PRN

## 2022-03-03 NOTE — Telephone Encounter (Signed)
Patient requesting refill. 

## 2022-03-05 ENCOUNTER — Ambulatory Visit: Payer: Managed Care, Other (non HMO) | Admitting: Plastic Surgery

## 2022-03-11 ENCOUNTER — Ambulatory Visit: Payer: Managed Care, Other (non HMO) | Admitting: Plastic Surgery

## 2022-03-11 ENCOUNTER — Institutional Professional Consult (permissible substitution): Payer: Managed Care, Other (non HMO) | Admitting: Plastic Surgery

## 2022-03-11 ENCOUNTER — Encounter: Payer: Self-pay | Admitting: Plastic Surgery

## 2022-03-11 DIAGNOSIS — C50911 Malignant neoplasm of unspecified site of right female breast: Secondary | ICD-10-CM

## 2022-03-11 DIAGNOSIS — Z923 Personal history of irradiation: Secondary | ICD-10-CM

## 2022-03-11 DIAGNOSIS — Z9889 Other specified postprocedural states: Secondary | ICD-10-CM

## 2022-03-11 NOTE — Progress Notes (Signed)
Referring Provider Sindy Guadeloupe, MD [place],   [id number]   CC:  Chief Complaint  Patient presents with   Follow-up   Advice Only      Emma Cuevas is an 56 y.o. female.  HPI: Ms. Russum returns today for discussion of the next stage of her breast cancer reconstruction.  She underwent mastectomy almost a year ago for invasive carcinoma with positive lymph nodes.  She had a tissue expander placed at the time of her mastectomy.  The tissue expander was rapidly inflated so that she could continue her chemotherapy and then start radiation therapy.  She completed her radiation therapy the middle of August without difficulty.  She is ready for a tissue expander to implant exchange and reduction of the left breast for symmetry.  Allergies  Allergen Reactions   Tegaderm Ag Mesh [Silver] Rash    Itchy/buring tiny red bump   Tape     Redness and burning    Outpatient Encounter Medications as of 03/11/2022  Medication Sig   abemaciclib (VERZENIO) 100 MG tablet Take 1 tablet (100 mg total) by mouth 2 (two) times daily.   acetaminophen (TYLENOL) 500 MG tablet Take 1,000 mg by mouth every 6 (six) hours as needed for moderate pain.   anastrozole (ARIMIDEX) 1 MG tablet Take 1 tablet (1 mg total) by mouth daily.   Calcium Carb-Cholecalciferol 500-10 MG-MCG TABS Take 1 tablet by mouth every morning.   diphenoxylate-atropine (LOMOTIL) 2.5-0.025 MG tablet Take 2 tablets by mouth 4 (four) times daily as needed for diarrhea or loose stools.   ondansetron (ZOFRAN) 8 MG tablet Take 1 tablet (8 mg total) by mouth every 8 (eight) hours as needed for nausea or vomiting.   prochlorperazine (COMPAZINE) 10 MG tablet Take 1 tablet (10 mg total) by mouth every 6 (six) hours as needed for nausea or vomiting.   No facility-administered encounter medications on file as of 03/11/2022.     Past Medical History:  Diagnosis Date   Anemia    Benign neoplasm of breast 2013   left  breast   BRCA negative 2015   Breast cancer ([place])    Cancer ([place])    Family history of bladder cancer    Family history of breast cancer    Family history of malignant neoplasm of breast 2013   Family history of pancreatic cancer    Screening for obesity     Past Surgical History:  Procedure Laterality Date   ABDOMINAL HYSTERECTOMY  2011   partial   BREAST BIOPSY Left 2013   BREAST BIOPSY Right 03/14/2021   Stereo bx-anterior calcs, "coil" clip-path pending   BREAST BIOPSY Right 03/14/2021   stereo bx-calcs, "Ribbon" clip-path pending   breast biopsy Right 03/14/2021   [place] Bx, Axilla, path pending   BREAST BIOPSY Left 04/30/2021   BREAST BIOPSY WITH RADIO FREQUENCY LOCALIZER Right 09/09/2021   RF tag in axilla   BREAST RECONSTRUCTION WITH PLACEMENT OF TISSUE EXPANDER AND FLEX HD (ACELLULAR HYDRATED DERMIS) Right 09/16/2021   Procedure: RIGHT BREAST RECONSTRUCTION WITH PLACEMENT OF TISSUE EXPANDER AND FLEX HD (ACELLULAR HYDRATED DERMIS);  Surgeon: Cindra Presume, MD;  Location: [facility];  Service: Plastics;  Laterality: Right;   BREAST SURGERY Left 10/16/2011   left breast finesse biopsy   COLONOSCOPY WITH PROPOFOL N/A 12/05/2014   Procedure: COLONOSCOPY WITH PROPOFOL;  Surgeon: Christene Lye, MD;  Location: [facility];  Service: Endoscopy;  Laterality: N/A;   DILATION AND CURETTAGE  OF UTERUS     IR CV LINE INJECTION  05/06/2021   IR IMAGING GUIDED PORT INSERTION  05/08/2021   MASTECTOMY Right    PORT-A-CATH REMOVAL  09/16/2021   Procedure: REMOVAL PORT-A-CATH;  Surgeon: Herbert Pun, MD;  Location: [facility];  Service: General;;   PORTACATH PLACEMENT N/A 04/02/2021   Procedure: INSERTION PORT-A-CATH;  Surgeon: Herbert Pun, MD;  Location: [facility];  Service: General;  Laterality: N/A;   SIMPLE MASTECTOMY WITH AXILLARY SENTINEL NODE BIOPSY Right 09/16/2021   Procedure: SIMPLE MASTECTOMY WITH AXILLARY SENTINEL NODE BIOPSY -- RF tag in axillary;   Surgeon: Herbert Pun, MD;  Location: [facility];  Service: General;  Laterality: Right;    Family History  Problem Relation Age of Onset   Breast cancer Mother 63   Bladder Cancer Father 23   Breast cancer Paternal Aunt 25   Pancreatic cancer Paternal Uncle 78   Pancreatic cancer Maternal Grandfather 22   Cancer Cousin        in ear    Social History   Social History Narrative   Married lives at home with husband     Review of Systems General: Denies fevers, chills, weight loss CV: Denies chest pain, shortness of breath, palpitations Unremarkable.  Physical Exam    03/02/2022   10:01 AM 02/02/2022    9:48 AM 02/02/2022    8:56 AM  Vitals with BMI  Height  5' 5.5" 5' 5.5"  Weight 184 lbs 8 oz 190 lbs 190 lbs  BMI  82.99 37.16  Systolic 967 893 810  Diastolic 45 71 71  Pulse 72 83 83    General:  No acute distress,  Alert and oriented, Non-Toxic, Normal speech and affect Breasts: Patient has a tissue expander on the right with a well-healed transverse incision from her mastectomy.  There is a slight concavity above the tissue expander the medial aspect of the chest wall.  The left breast is normal in appearance with grade 2 ptosis and is roughly a cup size larger than the right.  Assessment/Plan Breast cancer: The patient is ready to proceed with the next stage of her reconstruction.  We will schedule her for removal of the tissue expander and placement of a permanent silicone implant. She will also undergo a left breast reduction for symmetry at the same time.  We discussed both procedures including the risks of bleeding, infection, seroma with the exchanged to a permanent implant.  Additionally she understands she is at higher risk for loss of the implant due to exposure from poor wound healing because of her radiation therapy.  On the left side of the risks of the breast reduction include bleeding, infection, seroma formation.  Additionally these risks include loss of  the nipple due to ischemia at little change in sensation in the nipple and difficulty with interpretation of mammograms in the future.  I discussed with her the use of drains after the breast reduction and the use of compression for 6 weeks.  Leave the physical limitations of no heavy lifting no vigorous activity and no submerging the incisions in water were also discussed.  She understands all of this and request that I proceed.  [name] 03/11/2022, 5:34 PM

## 2022-03-25 ENCOUNTER — Telehealth: Payer: Self-pay

## 2022-03-25 NOTE — Telephone Encounter (Signed)
Returned patients call. Advised her I should be able to submit for pre-authorization by Friday of this week.

## 2022-03-25 NOTE — Telephone Encounter (Signed)
Returned patients called. Advised her that I have her case and will be able to submit for pre-authorization tomorrow.

## 2022-03-25 NOTE — Telephone Encounter (Signed)
Patient is calling in stating that she had expanders placed on 10/18, wanting to know when the second half of the surgery will be. Would like a call back.

## 2022-03-26 ENCOUNTER — Telehealth: Payer: Self-pay

## 2022-03-26 NOTE — Telephone Encounter (Signed)
Faxed information to [facility] for pre-authorization CPT code [phone] and [phone] (no pre auth required)

## 2022-03-30 ENCOUNTER — Telehealth: Payer: Self-pay | Admitting: *Deleted

## 2022-03-30 ENCOUNTER — Telehealth: Payer: Self-pay

## 2022-03-30 NOTE — Telephone Encounter (Signed)
No PA required CPT code 11970. Authorization #[id number] for CPT code [phone]. Gave to [facility] to schedule surgery

## 2022-03-30 NOTE — Telephone Encounter (Signed)
Spoke with patient and scheduled surgery and all associated appointments

## 2022-03-31 ENCOUNTER — Encounter: Payer: Self-pay | Admitting: Oncology

## 2022-04-01 NOTE — Telephone Encounter (Signed)
Move appt with me out by 2 weeks

## 2022-04-02 ENCOUNTER — Inpatient Hospital Stay: Payer: Managed Care, Other (non HMO) | Attending: Nurse Practitioner

## 2022-04-02 ENCOUNTER — Inpatient Hospital Stay: Payer: Managed Care, Other (non HMO) | Admitting: Pharmacist

## 2022-04-02 ENCOUNTER — Encounter: Payer: Self-pay | Admitting: Pharmacist

## 2022-04-02 VITALS — BP 117/59 | HR 71 | Temp 96.7°F | Resp 18 | Wt 184.7 lb

## 2022-04-02 DIAGNOSIS — Z803 Family history of malignant neoplasm of breast: Secondary | ICD-10-CM | POA: Insufficient documentation

## 2022-04-02 DIAGNOSIS — C50011 Malignant neoplasm of nipple and areola, right female breast: Secondary | ICD-10-CM | POA: Insufficient documentation

## 2022-04-02 DIAGNOSIS — Z8052 Family history of malignant neoplasm of bladder: Secondary | ICD-10-CM | POA: Diagnosis not present

## 2022-04-02 DIAGNOSIS — Z170 Estrogen receptor positive status [ER+]: Secondary | ICD-10-CM | POA: Insufficient documentation

## 2022-04-02 DIAGNOSIS — Z510 Encounter for antineoplastic radiation therapy: Secondary | ICD-10-CM | POA: Diagnosis present

## 2022-04-02 DIAGNOSIS — Z800 Family history of malignant neoplasm of digestive organs: Secondary | ICD-10-CM | POA: Diagnosis not present

## 2022-04-02 LAB — CBC WITH DIFFERENTIAL/PLATELET
Abs Immature Granulocytes: 0.01 10*3/uL (ref 0.00–0.07)
Basophils Absolute: 0 10*3/uL (ref 0.0–0.1)
Basophils Relative: 1 %
Eosinophils Absolute: 0.1 10*3/uL (ref 0.0–0.5)
Eosinophils Relative: 4 %
HCT: 33.5 % — ABNORMAL LOW (ref 36.0–46.0)
Hemoglobin: 10.6 g/dL — ABNORMAL LOW (ref 12.0–15.0)
Immature Granulocytes: 0 %
Lymphocytes Relative: 22 %
Lymphs Abs: 0.7 10*3/uL (ref 0.7–4.0)
MCH: 29.2 pg (ref 26.0–34.0)
MCHC: 31.6 g/dL (ref 30.0–36.0)
MCV: 92.3 fL (ref 80.0–100.0)
Monocytes Absolute: 0.3 10*3/uL (ref 0.1–1.0)
Monocytes Relative: 9 %
Neutro Abs: 2 10*3/uL (ref 1.7–7.7)
Neutrophils Relative %: 64 %
Platelets: 160 10*3/uL (ref 150–400)
RBC: 3.63 MIL/uL — ABNORMAL LOW (ref 3.87–5.11)
RDW: 17.2 % — ABNORMAL HIGH (ref 11.5–15.5)
WBC: 3.1 10*3/uL — ABNORMAL LOW (ref 4.0–10.5)
nRBC: 0 % (ref 0.0–0.2)

## 2022-04-02 LAB — COMPREHENSIVE METABOLIC PANEL
ALT: 12 U/L (ref 0–44)
AST: 19 U/L (ref 15–41)
Albumin: 3.7 g/dL (ref 3.5–5.0)
Alkaline Phosphatase: 41 U/L (ref 38–126)
Anion gap: 5 (ref 5–15)
BUN: 20 mg/dL (ref 6–20)
CO2: 27 mmol/L (ref 22–32)
Calcium: 9.1 mg/dL (ref 8.9–10.3)
Chloride: 108 mmol/L (ref 98–111)
Creatinine, Ser: 1.11 mg/dL — ABNORMAL HIGH (ref 0.44–1.00)
GFR, Estimated: 58 mL/min — ABNORMAL LOW (ref >60)
Glucose, Bld: 114 mg/dL — ABNORMAL HIGH (ref 70–99)
Potassium: 3.7 mmol/L (ref 3.5–5.1)
Sodium: 140 mmol/L (ref 135–145)
Total Bilirubin: 0.6 mg/dL (ref 0.3–1.2)
Total Protein: 6.9 g/dL (ref 6.5–8.1)

## 2022-04-02 NOTE — Progress Notes (Signed)
Pt states when she lays down at night her head spins; not sure if it deals with VERZENIO or inner ear issues but only occurs when she gets ready for bed.

## 2022-04-02 NOTE — Progress Notes (Signed)
[facility]  Telephone:([phone] [phone]  Patient Care Team: Sindy Guadeloupe, MD as PCP - General (Oncology) Sindy Guadeloupe, MD as Consulting Physician (Hematology and Oncology) Noreene Filbert, MD as Consulting Physician (Radiation Oncology) Herbert Pun, MD as Consulting Physician (General Surgery)   Name of the patient: Emma Cuevas  [id number]  [birth year]   Date of visit: 04/02/22  HPI: Patient is a 56 y.o. female with stage IIIa breast cancer, ER/PR positive, HER2 negative. She is currently taking Verzenio (abemaciclib) and anastrazole as adjuvant therapy. She started her abemeciclib on 02/02/22.  Reason for Consult: Oral chemotherapy follow-up for abemaciclib therapy.   PAST MEDICAL HISTORY: Past Medical History:  Diagnosis Date   Anemia    Benign neoplasm of breast 2013   left breast   BRCA negative 2015   Breast cancer ([place])    Cancer ([place])    Family history of bladder cancer    Family history of breast cancer    Family history of malignant neoplasm of breast 2013   Family history of pancreatic cancer    Screening for obesity     HEMATOLOGY/ONCOLOGY HISTORY:  Oncology History  Carcinoma of breast, estrogen and progesterone receptor positive ([place])  03/25/2021 Initial Diagnosis   Breast cancer, stage 1, estrogen receptor positive, right ([place])   03/25/2021 Cancer Staging   Staging form: Breast, AJCC 8th Edition - Clinical stage from 03/25/2021: Stage IIIA (cT3, cN1, cM0, G3, ER+, PR+, HER2-) - Signed by Sindy Guadeloupe, MD on 03/25/2021 Histologic grading system: 3 grade system   04/07/2021 - 08/22/2021 Chemotherapy   Patient is on Treatment Plan : BREAST NEOADJUVANT DOSE DENSE AC q14d / PACLitaxel q7d      Genetic Testing   Negative genetic testing. No pathogenic variants identified on the [facility] CancerNext-Expanded+RNA panel. The report date is 05/05/2021.  The CancerNext-Expanded + RNAinsight gene  panel offered by [employer] and includes sequencing and rearrangement analysis for the following 77 genes: IP, ALK, APC*, ATM*, AXIN2, BAP1, BARD1, BLM, BMPR1A, BRCA1*, BRCA2*, BRIP1*, CDC73, CDH1*,CDK4, CDKN1B, CDKN2A, CHEK2*, CTNNA1, DICER1, FANCC, FH, FLCN, GALNT12, KIF1B, LZTR1, MAX, MEN1, MET, MLH1*, MSH2*, MSH3, MSH6*, MUTYH*, NBN, NF1*, NF2, NTHL1, PALB2*, PHOX2B, PMS2*, POT1, PRKAR1A, PTCH1, PTEN*, RAD51C*, RAD51D*,RB1, RECQL, RET, SDHA, SDHAF2, SDHB, SDHC, SDHD, SMAD4, SMARCA4, SMARCB1, SMARCE1, STK11, SUFU, TMEM127, TP53*,TSC1, TSC2, VHL and XRCC2 (sequencing and deletion/duplication); EGFR, EGLN1, HOXB13, KIT, MITF, PDGFRA, POLD1 and POLE (sequencing only); EPCAM and GREM1 (deletion/duplication only).   09/26/2021 Cancer Staging   Staging form: Breast, AJCC 8th Edition - Pathologic stage from 09/26/2021: No Stage Recommended (ypT1a, pN1a, cM0, G3, ER+, PR+, HER2-) - Signed by Sindy Guadeloupe, MD on 09/26/2021 Stage prefix: Post-therapy Multigene prognostic tests performed: None Histologic grading system: 3 grade system     ALLERGIES:  is allergic to tegaderm ag mesh [silver] and tape.  MEDICATIONS:  Current Outpatient Medications  Medication Sig Dispense Refill   abemaciclib (VERZENIO) 100 MG tablet Take 1 tablet (100 mg total) by mouth 2 (two) times daily. 56 tablet 2   acetaminophen (TYLENOL) 500 MG tablet Take 1,000 mg by mouth every 6 (six) hours as needed for moderate pain.     anastrozole (ARIMIDEX) 1 MG tablet Take 1 tablet (1 mg total) by mouth daily. 90 tablet 0   Calcium Carb-Cholecalciferol 500-10 MG-MCG TABS Take 1 tablet by mouth every morning.     diphenoxylate-atropine (LOMOTIL) 2.5-0.025 MG tablet Take 2 tablets by mouth 4 (four) times daily as  needed for diarrhea or loose stools. (Patient not taking: Reported on 04/02/2022) 60 tablet 0   ondansetron (ZOFRAN) 8 MG tablet Take 1 tablet (8 mg total) by mouth every 8 (eight) hours as needed for nausea or vomiting. (Patient  not taking: Reported on 04/02/2022) 60 tablet 2   prochlorperazine (COMPAZINE) 10 MG tablet Take 1 tablet (10 mg total) by mouth every 6 (six) hours as needed for nausea or vomiting. (Patient not taking: Reported on 04/02/2022) 30 tablet 2   No current facility-administered medications for this visit.    VITAL SIGNS: BP (!) 117/59   Pulse 71   Temp (!) 96.7 F (35.9 C)   Resp 18   Wt 83.8 kg (184 lb 11.2 oz)   SpO2 100%   BMI 30.27 kg/m  Filed Weights   04/02/22 0928  Weight: 83.8 kg (184 lb 11.2 oz)    Estimated body mass index is 30.27 kg/m as calculated from the following:   Height as of 02/02/22: 5' 5.5" (1.664 m).   Weight as of this encounter: 83.8 kg (184 lb 11.2 oz).  LABS: CBC:    Component Value Date/Time   WBC 3.1 (L) 04/02/2022 0912   HGB 10.6 (L) 04/02/2022 0912   HCT 33.5 (L) 04/02/2022 0912   PLT 160 04/02/2022 0912   MCV 92.3 04/02/2022 0912   NEUTROABS 2.0 04/02/2022 0912   LYMPHSABS 0.7 04/02/2022 0912   MONOABS 0.3 04/02/2022 0912   EOSABS 0.1 04/02/2022 0912   BASOSABS 0.0 04/02/2022 0912   Comprehensive Metabolic Panel:    Component Value Date/Time   NA 140 04/02/2022 0912   K 3.7 04/02/2022 0912   CL 108 04/02/2022 0912   CO2 27 04/02/2022 0912   BUN 20 04/02/2022 0912   CREATININE 1.11 (H) 04/02/2022 0912   GLUCOSE 114 (H) 04/02/2022 0912   CALCIUM 9.1 04/02/2022 0912   AST 19 04/02/2022 0912   ALT 12 04/02/2022 0912   ALKPHOS 41 04/02/2022 0912   BILITOT 0.6 04/02/2022 0912   PROT 6.9 04/02/2022 0912   ALBUMIN 3.7 04/02/2022 0912     Present during today's visit: patient only  Assessment and Plan: Reviewed CMP/CBC with patient, continue abemaciclib 191m bid Since her dose reduction her diarrhea has improved Patient has upcoming reconstruction surgery. She will hold her abemaciclib for the surgery.    Oral Chemotherapy Side Effect/Intolerance:  Diarrhea: per patient her diarrhea incidents are dependent on her diet each day. She  has not needed and anti-diarrheal for a while. If she does have diarrhea it is one time then stops  Other: Patient reports being able to keep up with his normal work and day to day activities. She also walks about 4 time per week She reported some dizziness when laying down and switching sides at night. This may be related to her sinuses. Suggested patient start taking an antihistamine to see if that helps. She does report that she does have seasonal allergies.   Oral Chemotherapy Adherence: no missed doses reported No patient barriers to medication adherence identified.   New medications: none reported  Medication Access Issues: no issues, [facility]  Patient expressed understanding and was in agreement with this plan. She also understands that She can call clinic at any time with any questions, concerns, or complaints.   Follow-up plan: RTC in one month   Thank you for allowing me to participate in the care of this very pleasant patient.   Time Total: 15 mins  Visit consisted of counseling and education on dealing with issues of symptom management in the setting of serious and potentially life-threatening illness.Greater than 50%  of this time was spent counseling and coordinating care related to the above assessment and plan.  Signed by: Darl Pikes, PharmD, BCPS, Salley Slaughter, CPP Hematology/Oncology Clinical Pharmacist Practitioner [place]/DB/AP Oral [facility] [phone]  04/02/2022 5:10 PM

## 2022-04-10 NOTE — H&P (View-Only) (Signed)
Patient ID: Emma Cuevas, female    DOB: Nov 26, 1965, 56 y.o.   MRN: [id number]  Chief Complaint  Patient presents with   Pre-op Exam      ICD-10-CM   1. Malignant neoplasm of right female breast, unspecified estrogen receptor status, unspecified site of breast ([place])  C50.911     2. S/P breast reconstruction  Z98.890        History of Present Illness: Emma Cuevas is a 56 y.o.  female  with a history of right-sided breast cancer s/p right-sided total mastectomy with immediate reconstruction using tissue expander 09/16/2021 as well as subsequent chemotherapy and radiation.  She presents for preoperative evaluation for upcoming procedure, right-sided implant exchange and left-sided reduction and mastopexy for symmetry, scheduled for 05/01/2022 with Dr.  Lovena Le .  The patient has not had problems with anesthesia.  She does not smoke tobacco or use any nicotine-containing products.  Denies alcohol use.  Denies any personal history of CVA, use of blood thinners, as well as any notable cardiac or pulmonary disease.  She is taking anastrozole which she is told she can continue throughout perioperative period.  She is also taking Verzenio which she will hold 2 weeks prior to surgery and restart 1 week after surgery.  Patient reports that she has already discussed this with her oncologist.  She will hold her calcium 1 week prior to surgery.  She denies any personal or family history of blood clots or clotting disorder.  Summary of Previous Visit: She was last seen here in clinic on 03/11/2022 by Dr. Lovena Le.  Well-healed transverse incision from her mastectomy was noted on the right side.  Left breast was noted to be approximately a cup size larger than the right.  Discussed implant exchange as well as left-sided reduction for symmetry.  Current volume in the right expander is 480/475 cc.  Job: Works from home, computer-based position.  She is not requiring any FMLA or STD.  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   Past Medical History: Allergies: Allergies  Allergen Reactions   Tegaderm Ag Mesh [Silver] Rash    Itchy/buring tiny red bump   Tape     Redness and burning    Current Medications:  Current Outpatient Medications:    abemaciclib (VERZENIO) 100 MG tablet, Take 1 tablet (100 mg total) by mouth 2 (two) times daily., Disp: 56 tablet, Rfl: 2   acetaminophen (TYLENOL) 500 MG tablet, Take 1,000 mg by mouth every 6 (six) hours as needed for moderate pain., Disp: , Rfl:    anastrozole (ARIMIDEX) 1 MG tablet, Take 1 tablet (1 mg total) by mouth daily., Disp: 90 tablet, Rfl: 0   Calcium Carb-Cholecalciferol 500-10 MG-MCG TABS, Take 1 tablet by mouth every morning., Disp: , Rfl:    oxyCODONE (ROXICODONE) 5 MG immediate release tablet, Take 1 tablet (5 mg total) by mouth every 6 (six) hours as needed for up to 5 days for severe pain., Disp: 20 tablet, Rfl: 0   diphenoxylate-atropine (LOMOTIL) 2.5-0.025 MG tablet, Take 2 tablets by mouth 4 (four) times daily as needed for diarrhea or loose stools. (Patient not taking: Reported on 04/02/2022), Disp: 60 tablet, Rfl: 0   ondansetron (ZOFRAN) 8 MG tablet, Take 1 tablet (8 mg total) by mouth every 8 (eight) hours as needed for nausea or vomiting. (Patient not taking: Reported on 04/02/2022), Disp: 60  tablet, Rfl: 2   prochlorperazine (COMPAZINE) 10 MG tablet, Take 1 tablet (10 mg total) by mouth every 6 (six) hours as needed for nausea or vomiting. (Patient not taking: Reported on 04/02/2022), Disp: 30 tablet, Rfl: 2  Past Medical Problems: Past Medical History:  Diagnosis Date   Anemia    Benign neoplasm of breast 2013   left breast   BRCA negative 2015   Breast cancer ([place])    Cancer ([place])    Family history of bladder cancer    Family history of breast cancer    Family history of malignant  neoplasm of breast 2013   Family history of pancreatic cancer    Screening for obesity     Past Surgical History: Past Surgical History:  Procedure Laterality Date   ABDOMINAL HYSTERECTOMY  2011   partial   BREAST BIOPSY Left 2013   BREAST BIOPSY Right 03/14/2021   Stereo bx-anterior calcs, "coil" clip-path pending   BREAST BIOPSY Right 03/14/2021   stereo bx-calcs, "Ribbon" clip-path pending   breast biopsy Right 03/14/2021   [place] Bx, Axilla, path pending   BREAST BIOPSY Left 04/30/2021   BREAST BIOPSY WITH RADIO FREQUENCY LOCALIZER Right 09/09/2021   RF tag in axilla   BREAST RECONSTRUCTION WITH PLACEMENT OF TISSUE EXPANDER AND FLEX HD (ACELLULAR HYDRATED DERMIS) Right 09/16/2021   Procedure: RIGHT BREAST RECONSTRUCTION WITH PLACEMENT OF TISSUE EXPANDER AND FLEX HD (ACELLULAR HYDRATED DERMIS);  Surgeon: Cindra Presume, MD;  Location: [facility] OR[facility];  Service: Plastics;  Laterality: Right;   BREAST SURGERY Left 10/16/2011   left breast finesse biopsy   COLONOSCOPY WITH PROPOFOL N/A 12/05/2014   Procedure: COLONOSCOPY WITH PROPOFOL;  Surgeon: Christene Lye, MD;  Location: [facility];  Service: Endoscopy;  Laterality: N/A;   DILATION AND CURETTAGE OF UTERUS     IR CV LINE INJECTION  05/06/2021   IR IMAGING GUIDED PORT INSERTION  05/08/2021   MASTECTOMY Right    PORT-A-CATH REMOVAL  09/16/2021   Procedure: REMOVAL PORT-A-CATH;  Surgeon: Herbert Pun, MD;  Location: [facility];  Service: General;;   PORTACATH PLACEMENT N/A 04/02/2021   Procedure: INSERTION PORT-A-CATH;  Surgeon: Herbert Pun, MD;  Location: [facility];  Service: General;  Laterality: N/A;   SIMPLE MASTECTOMY WITH AXILLARY SENTINEL NODE BIOPSY Right 09/16/2021   Procedure: SIMPLE MASTECTOMY WITH AXILLARY SENTINEL NODE BIOPSY -- RF tag in axillary;  Surgeon: Herbert Pun, MD;  Location: [facility];  Service: General;  Laterality: Right;    Social History: Social History   Socioeconomic  History   Marital status: Married    Spouse name: Legrand Como   Number of children: Not on file   Years of education: Not on file   Highest education level: Not on file  Occupational History   Not on file  Tobacco Use   Smoking status: Never   Smokeless tobacco: Never  Vaping Use   Vaping Use: Never used  Substance and Sexual Activity   Alcohol use: No   Drug use: No   Sexual activity: Not on file  Other Topics Concern   Not on file  Social History Narrative   Married lives at home with husband   Social Determinants of Health   Financial Resource Strain: Not on file  Food Insecurity: Not on file  Transportation Needs: Not on file  Physical Activity: Not on file  Stress: Not on file  Social Connections: Not on file  Intimate Partner Violence: Not on file    Family History: Family History  Problem Relation Age of Onset   Breast cancer Mother 82   Bladder Cancer Father 30   Breast cancer Paternal Aunt 6   Pancreatic cancer Paternal Uncle 75   Pancreatic cancer Maternal Grandfather 47   Cancer Cousin        in ear    Review of Systems: ROS Denies any recent chest pain, leg swelling, or fevers.  Physical Exam: Vital Signs BP 125/75 (BP Location: Left Arm, Patient Position: Sitting, Cuff Size: Large)   Pulse 80   Ht 5' 5.5" (1.664 m)   Wt 184 lb (83.5 kg)   SpO2 98%   BMI 30.15 kg/m   Physical Exam Constitutional:      General: Not in acute distress.    Appearance: Normal appearance. Not ill-appearing.  HENT:     Head: Normocephalic and atraumatic.  Eyes:     Pupils: Pupils are equal, round. Cardiovascular:     Rate and Rhythm: Normal rate.    Pulses: Normal pulses.  Pulmonary:     Effort: No respiratory distress or increased work of breathing.  Speaks in full sentences. Abdominal:     General: Abdomen is flat. No distension.   Musculoskeletal: Normal range of motion. No lower extremity swelling or edema. No varicosities. Skin:    General: Skin is  warm and dry.     Findings: No erythema or rash.  Neurological:     Mental Status: Alert and oriented to person, place, and time.  Psychiatric:        Mood and Affect: Mood normal.        Behavior: Behavior normal.    Assessment/Plan: The patient is scheduled for right-sided implant exchange and left-sided reduction and mastopexy for symmetry with Dr.  Lovena Le .  Risks, benefits, and alternatives of procedure discussed, questions answered and consent obtained.    Smoking Status: Non-smoker. Last Mammogram: Unilateral left on 02/19/2022; Results: BI-RADS Category 1: Negative.  Caprini Score: 6; Risk Factors include: Age, BMI greater than 25, history of breast cancer, and length of planned surgery. Recommendation for mechanical prophylaxis. Encourage early ambulation.   Pictures obtained: Today.  Post-op Rx sent to pharmacy: Oxycodone.  She has ample Compazine and Zofran at home.  Patient was provided with the General Surgical Risk consent document and Pain Medication Agreement prior to their appointment.  They had adequate time to read through the risk consent documents and Pain Medication Agreement. We also discussed them in person together during this preop appointment. All of their questions were answered to their satisfaction.  Recommended calling if they have any further questions.  Risk consent form and Pain Medication Agreement to be scanned into patient's chart.  The risk that can be encountered with breast reduction were discussed and include the following but not limited to these:  Breast asymmetry, fluid accumulation, firmness of the breast, inability to breast feed, loss of nipple or areola, skin loss, decrease or no nipple sensation, fat necrosis of the breast tissue, bleeding, infection, healing delay.  There are risks of anesthesia, changes to skin sensation and injury to nerves or blood vessels.  The muscle can be temporarily or permanently injured.  You may have an allergic  reaction to tape, suture, glue, blood products which can result in skin discoloration, swelling, pain, skin lesions, poor healing.  Any of these can lead to the need for revisonal surgery or stage procedures.  A reduction has potential to interfere with diagnostic procedures.  Nipple or breast piercing can increase risks of  infection.  This procedure is best done when the breast is fully developed.  Changes in the breast will continue to occur over time.  Pregnancy can alter the outcomes of previous breast reduction surgery, weight gain and weigh loss can also effect the long term appearance.   The risks that can be encountered with and after placement of a breast implant placement were discussed and include the following but not limited to these: bleeding, infection, delayed healing, anesthesia risks, skin sensation changes, injury to structures including nerves, blood vessels, and muscles which may be temporary or permanent, allergies to tape, suture materials and glues, blood products, topical preparations or injected agents, skin contour irregularities, skin discoloration and swelling, deep vein thrombosis, cardiac and pulmonary complications, pain, which may persist, fluid accumulation, wrinkling of the skin over the implant, changes in nipple or breast sensation, implant leakage or rupture, faulty position of the implant, persistent pain, formation of tight scar tissue around the implant (capsular contracture), possible need for revisional surgery or staged procedures.    Electronically signed by: Krista Blue, PA-C 04/13/2022 11:45 AM

## 2022-04-10 NOTE — Progress Notes (Signed)
Patient ID: Emma Cuevas, female    DOB: Oct 31, 1965, 56 y.o.   MRN: [id number]  Chief Complaint  Patient presents with   Pre-op Exam      ICD-10-CM   1. Malignant neoplasm of right female breast, unspecified estrogen receptor status, unspecified site of breast ([place])  C50.911     2. S/P breast reconstruction  Z98.890        History of Present Illness: Emma Cuevas is a 56 y.o.  female  with a history of right-sided breast cancer s/p right-sided total mastectomy with immediate reconstruction using tissue expander 09/16/2021 as well as subsequent chemotherapy and radiation.  She presents for preoperative evaluation for upcoming procedure, right-sided implant exchange and left-sided reduction and mastopexy for symmetry, scheduled for 05/01/2022 with Dr.  Lovena Le .  The patient has not had problems with anesthesia.  She does not smoke tobacco or use any nicotine-containing products.  Denies alcohol use.  Denies any personal history of CVA, use of blood thinners, as well as any notable cardiac or pulmonary disease.  She is taking anastrozole which she is told she can continue throughout perioperative period.  She is also taking Verzenio which she will hold 2 weeks prior to surgery and restart 1 week after surgery.  Patient reports that she has already discussed this with her oncologist.  She will hold her calcium 1 week prior to surgery.  She denies any personal or family history of blood clots or clotting disorder.  Summary of Previous Visit: She was last seen here in clinic on 03/11/2022 by Dr. Lovena Le.  Well-healed transverse incision from her mastectomy was noted on the right side.  Left breast was noted to be approximately a cup size larger than the right.  Discussed implant exchange as well as left-sided reduction for symmetry.  Current volume in the right expander is 480/475 cc.  Job: Works from home, computer-based position.  She is not requiring any FMLA or STD.  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   Past Medical History: Allergies: Allergies  Allergen Reactions   Tegaderm Ag Mesh [Silver] Rash    Itchy/buring tiny red bump   Tape     Redness and burning    Current Medications:  Current Outpatient Medications:    abemaciclib (VERZENIO) 100 MG tablet, Take 1 tablet (100 mg total) by mouth 2 (two) times daily., Disp: 56 tablet, Rfl: 2   acetaminophen (TYLENOL) 500 MG tablet, Take 1,000 mg by mouth every 6 (six) hours as needed for moderate pain., Disp: , Rfl:    anastrozole (ARIMIDEX) 1 MG tablet, Take 1 tablet (1 mg total) by mouth daily., Disp: 90 tablet, Rfl: 0   Calcium Carb-Cholecalciferol 500-10 MG-MCG TABS, Take 1 tablet by mouth every morning., Disp: , Rfl:    oxyCODONE (ROXICODONE) 5 MG immediate release tablet, Take 1 tablet (5 mg total) by mouth every 6 (six) hours as needed for up to 5 days for severe pain., Disp: 20 tablet, Rfl: 0   diphenoxylate-atropine (LOMOTIL) 2.5-0.025 MG tablet, Take 2 tablets by mouth 4 (four) times daily as needed for diarrhea or loose stools. (Patient not taking: Reported on 04/02/2022), Disp: 60 tablet, Rfl: 0   ondansetron (ZOFRAN) 8 MG tablet, Take 1 tablet (8 mg total) by mouth every 8 (eight) hours as needed for nausea or vomiting. (Patient not taking: Reported on 04/02/2022), Disp: 60  tablet, Rfl: 2   prochlorperazine (COMPAZINE) 10 MG tablet, Take 1 tablet (10 mg total) by mouth every 6 (six) hours as needed for nausea or vomiting. (Patient not taking: Reported on 04/02/2022), Disp: 30 tablet, Rfl: 2  Past Medical Problems: Past Medical History:  Diagnosis Date   Anemia    Benign neoplasm of breast 2013   left breast   BRCA negative 2015   Breast cancer ([place])    Cancer ([place])    Family history of bladder cancer    Family history of breast cancer    Family history of malignant  neoplasm of breast 2013   Family history of pancreatic cancer    Screening for obesity     Past Surgical History: Past Surgical History:  Procedure Laterality Date   ABDOMINAL HYSTERECTOMY  2011   partial   BREAST BIOPSY Left 2013   BREAST BIOPSY Right 03/14/2021   Stereo bx-anterior calcs, "coil" clip-path pending   BREAST BIOPSY Right 03/14/2021   stereo bx-calcs, "Ribbon" clip-path pending   breast biopsy Right 03/14/2021   [place] Bx, Axilla, path pending   BREAST BIOPSY Left 04/30/2021   BREAST BIOPSY WITH RADIO FREQUENCY LOCALIZER Right 09/09/2021   RF tag in axilla   BREAST RECONSTRUCTION WITH PLACEMENT OF TISSUE EXPANDER AND FLEX HD (ACELLULAR HYDRATED DERMIS) Right 09/16/2021   Procedure: RIGHT BREAST RECONSTRUCTION WITH PLACEMENT OF TISSUE EXPANDER AND FLEX HD (ACELLULAR HYDRATED DERMIS);  Surgeon: Cindra Presume, MD;  Location: [facility];  Service: Plastics;  Laterality: Right;   BREAST SURGERY Left 10/16/2011   left breast finesse biopsy   COLONOSCOPY WITH PROPOFOL N/A 12/05/2014   Procedure: COLONOSCOPY WITH PROPOFOL;  Surgeon: Christene Lye, MD;  Location: [facility];  Service: Endoscopy;  Laterality: N/A;   DILATION AND CURETTAGE OF UTERUS     IR CV LINE INJECTION  05/06/2021   IR IMAGING GUIDED PORT INSERTION  05/08/2021   MASTECTOMY Right    PORT-A-CATH REMOVAL  09/16/2021   Procedure: REMOVAL PORT-A-CATH;  Surgeon: Herbert Pun, MD;  Location: [facility];  Service: General;;   PORTACATH PLACEMENT N/A 04/02/2021   Procedure: INSERTION PORT-A-CATH;  Surgeon: Herbert Pun, MD;  Location: [facility];  Service: General;  Laterality: N/A;   SIMPLE MASTECTOMY WITH AXILLARY SENTINEL NODE BIOPSY Right 09/16/2021   Procedure: SIMPLE MASTECTOMY WITH AXILLARY SENTINEL NODE BIOPSY -- RF tag in axillary;  Surgeon: Herbert Pun, MD;  Location: [facility];  Service: General;  Laterality: Right;    Social History: Social History   Socioeconomic  History   Marital status: Married    Spouse name: Legrand Como   Number of children: Not on file   Years of education: Not on file   Highest education level: Not on file  Occupational History   Not on file  Tobacco Use   Smoking status: Never   Smokeless tobacco: Never  Vaping Use   Vaping Use: Never used  Substance and Sexual Activity   Alcohol use: No   Drug use: No   Sexual activity: Not on file  Other Topics Concern   Not on file  Social History Narrative   Married lives at home with husband   Social Determinants of Health   Financial Resource Strain: Not on file  Food Insecurity: Not on file  Transportation Needs: Not on file  Physical Activity: Not on file  Stress: Not on file  Social Connections: Not on file  Intimate Partner Violence: Not on file    Family History: Family History  Problem Relation Age of Onset   Breast cancer Mother 22   Bladder Cancer Father 63   Breast cancer Paternal Aunt 56   Pancreatic cancer Paternal Uncle 29   Pancreatic cancer Maternal Grandfather 44   Cancer Cousin        in ear    Review of Systems: ROS Denies any recent chest pain, leg swelling, or fevers.  Physical Exam: Vital Signs BP 125/75 (BP Location: Left Arm, Patient Position: Sitting, Cuff Size: Large)   Pulse 80   Ht 5' 5.5" (1.664 m)   Wt 184 lb (83.5 kg)   SpO2 98%   BMI 30.15 kg/m   Physical Exam Constitutional:      General: Not in acute distress.    Appearance: Normal appearance. Not ill-appearing.  HENT:     Head: Normocephalic and atraumatic.  Eyes:     Pupils: Pupils are equal, round. Cardiovascular:     Rate and Rhythm: Normal rate.    Pulses: Normal pulses.  Pulmonary:     Effort: No respiratory distress or increased work of breathing.  Speaks in full sentences. Abdominal:     General: Abdomen is flat. No distension.   Musculoskeletal: Normal range of motion. No lower extremity swelling or edema. No varicosities. Skin:    General: Skin is  warm and dry.     Findings: No erythema or rash.  Neurological:     Mental Status: Alert and oriented to person, place, and time.  Psychiatric:        Mood and Affect: Mood normal.        Behavior: Behavior normal.    Assessment/Plan: The patient is scheduled for right-sided implant exchange and left-sided reduction and mastopexy for symmetry with Dr.  Lovena Le .  Risks, benefits, and alternatives of procedure discussed, questions answered and consent obtained.    Smoking Status: Non-smoker. Last Mammogram: Unilateral left on 02/19/2022; Results: BI-RADS Category 1: Negative.  Caprini Score: 6; Risk Factors include: Age, BMI greater than 25, history of breast cancer, and length of planned surgery. Recommendation for mechanical prophylaxis. Encourage early ambulation.   Pictures obtained: Today.  Post-op Rx sent to pharmacy: Oxycodone.  She has ample Compazine and Zofran at home.  Patient was provided with the General Surgical Risk consent document and Pain Medication Agreement prior to their appointment.  They had adequate time to read through the risk consent documents and Pain Medication Agreement. We also discussed them in person together during this preop appointment. All of their questions were answered to their satisfaction.  Recommended calling if they have any further questions.  Risk consent form and Pain Medication Agreement to be scanned into patient's chart.  The risk that can be encountered with breast reduction were discussed and include the following but not limited to these:  Breast asymmetry, fluid accumulation, firmness of the breast, inability to breast feed, loss of nipple or areola, skin loss, decrease or no nipple sensation, fat necrosis of the breast tissue, bleeding, infection, healing delay.  There are risks of anesthesia, changes to skin sensation and injury to nerves or blood vessels.  The muscle can be temporarily or permanently injured.  You may have an allergic  reaction to tape, suture, glue, blood products which can result in skin discoloration, swelling, pain, skin lesions, poor healing.  Any of these can lead to the need for revisonal surgery or stage procedures.  A reduction has potential to interfere with diagnostic procedures.  Nipple or breast piercing can increase risks of  infection.  This procedure is best done when the breast is fully developed.  Changes in the breast will continue to occur over time.  Pregnancy can alter the outcomes of previous breast reduction surgery, weight gain and weigh loss can also effect the long term appearance.   The risks that can be encountered with and after placement of a breast implant placement were discussed and include the following but not limited to these: bleeding, infection, delayed healing, anesthesia risks, skin sensation changes, injury to structures including nerves, blood vessels, and muscles which may be temporary or permanent, allergies to tape, suture materials and glues, blood products, topical preparations or injected agents, skin contour irregularities, skin discoloration and swelling, deep vein thrombosis, cardiac and pulmonary complications, pain, which may persist, fluid accumulation, wrinkling of the skin over the implant, changes in nipple or breast sensation, implant leakage or rupture, faulty position of the implant, persistent pain, formation of tight scar tissue around the implant (capsular contracture), possible need for revisional surgery or staged procedures.    Electronically signed by: Krista Blue, PA-C 04/13/2022 11:45 AM

## 2022-04-13 ENCOUNTER — Encounter: Payer: Self-pay | Admitting: Physician Assistant

## 2022-04-13 ENCOUNTER — Ambulatory Visit (INDEPENDENT_AMBULATORY_CARE_PROVIDER_SITE_OTHER): Payer: Managed Care, Other (non HMO) | Admitting: Physician Assistant

## 2022-04-13 VITALS — BP 125/75 | HR 80 | Ht 65.5 in | Wt 184.0 lb

## 2022-04-13 DIAGNOSIS — Z9889 Other specified postprocedural states: Secondary | ICD-10-CM

## 2022-04-13 DIAGNOSIS — C50911 Malignant neoplasm of unspecified site of right female breast: Secondary | ICD-10-CM

## 2022-04-13 MED ORDER — OXYCODONE HCL 5 MG PO TABS: 5.0000 mg | ORAL_TABLET | Freq: Four times a day (QID) | ORAL | 0 refills | Status: AC | PRN

## 2022-04-27 ENCOUNTER — Ambulatory Visit: Payer: Managed Care, Other (non HMO) | Admitting: Dermatology

## 2022-04-27 ENCOUNTER — Other Ambulatory Visit: Payer: Self-pay

## 2022-04-27 ENCOUNTER — Encounter (HOSPITAL_BASED_OUTPATIENT_CLINIC_OR_DEPARTMENT_OTHER): Payer: Self-pay | Admitting: Plastic Surgery

## 2022-04-27 DIAGNOSIS — L578 Other skin changes due to chronic exposure to nonionizing radiation: Secondary | ICD-10-CM

## 2022-04-27 DIAGNOSIS — D229 Melanocytic nevi, unspecified: Secondary | ICD-10-CM

## 2022-04-27 DIAGNOSIS — L719 Rosacea, unspecified: Secondary | ICD-10-CM | POA: Diagnosis not present

## 2022-04-27 DIAGNOSIS — L219 Seborrheic dermatitis, unspecified: Secondary | ICD-10-CM

## 2022-04-27 DIAGNOSIS — Z1283 Encounter for screening for malignant neoplasm of skin: Secondary | ICD-10-CM | POA: Diagnosis not present

## 2022-04-27 DIAGNOSIS — Z79899 Other long term (current) drug therapy: Secondary | ICD-10-CM

## 2022-04-27 DIAGNOSIS — L814 Other melanin hyperpigmentation: Secondary | ICD-10-CM

## 2022-04-27 DIAGNOSIS — L821 Other seborrheic keratosis: Secondary | ICD-10-CM

## 2022-04-27 NOTE — Patient Instructions (Addendum)
For dry plaques at scalp   Can use T sal shampoo or salicylic acid shampoo  Can purchase online Urea 40 % cream to apply at patches a few hours before shampoo to help get rid of scale.    Rosacea  Will prescribe [facility] metronidazole/ivermectin/azelaic acid twice daily as needed to affected areas on the face. The patient was advised this is not covered by insurance since it is made by a compounding pharmacy. They will receive an email to check out and the medication will be mailed to their home.    Instructions for [facility] Medications  One or more of your medications was sent to the [facility] mail order compounding pharmacy. You will receive an email from them and can purchase the medicine through that link. It will then be mailed to your home at the address you confirmed. If for any reason you do not receive an email from them, please check your spam folder. If you still do not find the email, please let us know. [facility] phone number is [phone].   Melanoma ABCDEs  Melanoma is the most dangerous type of skin cancer, and is the leading cause of death from skin disease.  You are more likely to develop melanoma if you: Have light-colored skin, light-colored eyes, or red or blond hair Spend a lot of time in the sun Tan regularly, either outdoors or in a tanning bed Have had blistering sunburns, especially during childhood Have a close family member who has had a melanoma Have atypical moles or large birthmarks  Early detection of melanoma is key since treatment is typically straightforward and cure rates are extremely high if we catch it early.   The first sign of melanoma is often a change in a mole or a new dark spot.  The ABCDE system is a way of remembering the signs of melanoma.  A for asymmetry:  The two halves do not match. B for border:  The edges of the growth are irregular. C for color:  A mixture of colors are present instead of an even brown  color. D for diameter:  Melanomas are usually (but not always) greater than 41m - the size of a pencil eraser. E for evolution:  The spot keeps changing in size, shape, and color.  Please check your skin once per month between visits. You can use a small mirror in front and a large mirror behind you to keep an eye on the back side or your body.   If you see any new or changing lesions before your next follow-up, please call to schedule a visit.  Please continue daily skin protection including broad spectrum sunscreen SPF 30+ to sun-exposed areas, reapplying every 2 hours as needed when you're outdoors.   Staying in the shade or wearing long sleeves, sun glasses (UVA+UVB protection) and wide brim hats (4-inch brim around the entire circumference of the hat) are also recommended for sun protection.    Due to recent changes in healthcare laws, you may see results of your pathology and/or laboratory studies on MyChart before the doctors have had a chance to review them. We understand that in some cases there may be results that are confusing or concerning to you. Please understand that not all results are received at the same time and often the doctors may need to interpret multiple results in order to provide you with the best plan of care or course of treatment. Therefore, we ask that you please give [place]  2 business days to thoroughly review all your results before contacting the office for clarification. Should we see a critical lab result, you will be contacted sooner.   If You Need Anything After Your Visit  If you have any questions or concerns for your doctor, please call our main line at [phone] and press option 4 to reach your doctor's medical assistant. If no one answers, please leave a voicemail as directed and we will return your call as soon as possible. Messages left after 4 pm will be answered the following business day.   You may also send [place] a message via [place]. We typically  respond to MyChart messages within 1-2 business days.  For prescription refills, please ask your pharmacy to contact our office. Our fax number is [phone].  If you have an urgent issue when the clinic is closed that cannot wait until the next business day, you can page your doctor at the number below.    Please note that while we do our best to be available for urgent issues outside of office hours, we are not available 24/7.   If you have an urgent issue and are unable to reach [place], you may choose to seek medical care at your doctor's office, retail clinic, urgent care center, or emergency room.  If you have a medical emergency, please immediately call 911 or go to the emergency department.  Pager Numbers  - Dr. Nehemiah Massed: [phone]  - Dr. Laurence Ferrari: [phone]  - Dr. Nicole Kindred: [phone]  In the event of inclement weather, please call our main line at [phone] for an update on the status of any delays or closures.  Dermatology Medication Tips: Please keep the boxes that topical medications come in in order to help keep track of the instructions about where and how to use these. Pharmacies typically print the medication instructions only on the boxes and not directly on the medication tubes.   If your medication is too expensive, please contact our office at [phone] option 4 or send [place] a message through [place].   We are unable to tell what your co-pay for medications will be in advance as this is different depending on your insurance coverage. However, we may be able to find a substitute medication at lower cost or fill out paperwork to get insurance to cover a needed medication.   If a prior authorization is required to get your medication covered by your insurance company, please allow [place] 1-2 business days to complete this process.  Drug prices often vary depending on where the prescription is filled and some pharmacies may offer cheaper prices.  The website  www.goodrx.com contains coupons for medications through different pharmacies. The prices here do not account for what the cost may be with help from insurance (it may be cheaper with your insurance), but the website can give you the price if you did not use any insurance.  - You can print the associated coupon and take it with your prescription to the pharmacy.  - You may also stop by our office during regular business hours and pick up a GoodRx coupon card.  - If you need your prescription sent electronically to a different pharmacy, notify our office through [facility] or by phone at [phone] option 4.     Si Usted Necesita Algo Despus de Su Visita  Tambin puede enviarnos un mensaje a travs de Pharmacist, community. Por lo general respondemos a los mensajes de MyChart en el transcurso de 1 a 2  das hbiles.  Para renovar recetas, por favor pida a su farmacia que se ponga en contacto con nuestra oficina. Harland Dingwall de fax es [place] [phone].  Si tiene un asunto urgente cuando la clnica est cerrada y que no puede esperar hasta el siguiente da hbil, puede llamar/localizar a su doctor(a) al nmero que aparece a continuacin.   Por favor, tenga en cuenta que aunque hacemos todo lo posible para estar disponibles para asuntos urgentes fuera del horario de [place], no estamos disponibles las 24 horas del da, los 7 das de la [employer].   Si tiene un problema urgente y no puede comunicarse con nosotros, puede optar por buscar atencin mdica  en el consultorio de su doctor(a), en una clnica privada, en un centro de atencin urgente o en una sala de emergencias.  Si tiene Engineering geologist, por favor llame inmediatamente al 911 o vaya a la sala de emergencias.  Nmeros de bper  - Dr. Nehemiah Massed: [phone]  - Dra. Moye: [phone]  - Dra. Nicole Kindred: [phone]  En caso de inclemencias del [place], por favor llame a Johnsie Kindred principal al [phone] para una actualizacin  sobre el [place] de cualquier retraso o cierre.  Consejos para la medicacin en dermatologa: Por favor, guarde las cajas en las que vienen los medicamentos de uso tpico para ayudarle a seguir las instrucciones sobre dnde y cmo usarlos. Las farmacias generalmente imprimen las instrucciones del medicamento slo en las cajas y no directamente en los tubos del [place].   Si su medicamento es muy caro, por favor, pngase en contacto con Zigmund Daniel llamando al [phone] y presione la opcin 4 o envenos un mensaje a travs de Pharmacist, community.   No podemos decirle cul ser su copago por los medicamentos por adelantado ya que esto es diferente dependiendo de la cobertura de su seguro. Sin embargo, es posible que podamos encontrar un medicamento sustituto a Electrical engineer un formulario para que el seguro cubra el medicamento que se considera necesario.   Si se requiere una autorizacin previa para que su compaa de seguros [place] su medicamento, por favor permtanos de 1 a 2 das hbiles para completar este proceso.  Los precios de los medicamentos varan con frecuencia dependiendo del Environmental consultant de dnde se surte la receta y alguna farmacias pueden ofrecer precios ms baratos.  El sitio web www.goodrx.com tiene cupones para medicamentos de Airline pilot. Los precios aqu no tienen en cuenta lo que podra costar con la ayuda del seguro (puede ser ms barato con su seguro), pero el sitio web puede darle el precio si no utiliz Research scientist (physical sciences).  - Puede imprimir el cupn correspondiente y llevarlo con su receta a la farmacia.  - Tambin puede pasar por nuestra oficina durante el horario de atencin regular y Charity fundraiser una tarjeta de cupones de GoodRx.  - Si necesita que su receta se enve electrnicamente a una farmacia diferente, informe a nuestra oficina a travs de MyChart de [place] o por telfono llamando al [phone] y presione la opcin 4.

## 2022-04-27 NOTE — Progress Notes (Signed)
Follow-Up Visit   Subjective  Emma Cuevas is a 56 y.o. female who presents for the following: Annual Exam (1 year tbse, hx of recent breast cancer in right breast. Hx of rosacea. ). The patient presents for Total-Body Skin Exam (TBSE) for skin cancer screening and mole check.  The patient has spots, moles and lesions to be evaluated, some may be new or changing and the patient has concerns that these could be cancer.  The following portions of the chart were reviewed this encounter and updated as appropriate:  Tobacco  Allergies  Meds  Problems  Med Hx  Surg Hx  Fam Hx     Review of Systems: No other skin or systemic complaints except as noted in HPI or Assessment and Plan.  Objective  Well appearing patient in no apparent distress; mood and affect are within normal limits.  A full examination was performed including scalp, head, eyes, ears, nose, lips, neck, chest, axillae, abdomen, back, buttocks, bilateral upper extremities, bilateral lower extremities, hands, feet, fingers, toes, fingernails, and toenails. All findings within normal limits unless otherwise noted below.  face A little pinkness at nose  Scalp Hyperkeratotic plaque at right vertex scalp   Assessment & Plan  Rosacea face Restart [facility] triple rosacea cream PRN Instructions for [facility] Medications One or more of your medications was sent to the [facility] mail order compounding pharmacy. You will receive an email from them and can purchase the medicine through that link. It will then be mailed to your home at the address you confirmed. If for any reason you do not receive an email from them, please check your spam folder. If you still do not find the email, please let us know. [facility] phone number is [phone].  Rosacea is a chronic progressive skin condition usually affecting the face of adults, causing redness and/or acne bumps. It is treatable but not curable. It  sometimes affects the eyes (ocular rosacea) as well. It may respond to topical and/or systemic medication and can flare with stress, sun exposure, alcohol, exercise and some foods.  Daily application of broad spectrum spf 30+ sunscreen to face is recommended to reduce flares.   Seborrheic dermatitis Scalp Seborrheic Dermatitis  -  is a chronic persistent rash characterized by pinkness and scaling most commonly of the mid face but also can occur on the scalp (dandruff), ears; mid chest, mid back and groin.  It tends to be exacerbated by stress and cooler weather.  People who have neurologic disease may experience new onset or exacerbation of existing seborrheic dermatitis.  The condition is not curable but treatable and can be controlled.  Can start T-Sal or salicylic shampoos to help with scale  Can start Urea 40 % cream apply a few hours before shampoo to help with scale. Arboriculturist) Patient declined prescription treatment.  Lentigines - Scattered tan macules - Due to sun exposure - Benign-appearing, observe - Recommend daily broad spectrum sunscreen SPF 30+ to sun-exposed areas, reapply every 2 hours as needed. - Call for any changes  Seborrheic Keratoses - Stuck-on, waxy, tan-brown papules and/or plaques  - Benign-appearing - Discussed benign etiology and prognosis. - Observe - Call for any changes  Melanocytic Nevi - Tan-brown and/or pink-flesh-colored symmetric macules and papules - Benign appearing on exam today - Observation - Call clinic for new or changing moles - Recommend daily use of broad spectrum spf 30+ sunscreen to sun-exposed areas.   Hemangiomas - Red papules - Discussed  benign nature - Observe - Call for any changes  Actinic Damage - Chronic condition, secondary to cumulative UV/sun exposure - diffuse scaly erythematous macules with underlying dyspigmentation - Recommend daily broad spectrum sunscreen SPF 30+ to sun-exposed areas, reapply every 2  hours as needed.  - Staying in the shade or wearing long sleeves, sun glasses (UVA+UVB protection) and wide brim hats (4-inch brim around the entire circumference of the hat) are also recommended for sun protection.  - Call for new or changing lesions.  History of breast cancer in right breast No lymphandenopathy  Skin cancer screening performed today.  Return in about 1 year (around 04/28/2023) for TBSE.  IRuthell Rummage, CMA, am acting as scribe for Sarina Ser, MD. Documentation: I have reviewed the above documentation for accuracy and completeness, and I agree with the above.  Sarina Ser, MD

## 2022-04-29 MED ORDER — CHLORHEXIDINE GLUCONATE CLOTH 2 % EX PADS: 6.0000 | MEDICATED_PAD | Freq: Once | CUTANEOUS | Status: DC

## 2022-04-29 NOTE — Progress Notes (Signed)
       Patient Instructions  The night before surgery:  No food after midnight. ONLY clear liquids after midnight  The day of surgery (if you do NOT have diabetes):  Drink ONE (1) Pre-Surgery Clear Ensure as directed.   This drink was given to you during your hospital  pre-op appointment visit. The pre-op nurse will instruct you on the time to drink the  Pre-Surgery Ensure depending on your surgery time. Finish the drink at the designated time by the pre-op nurse.  Nothing else to drink after completing the  Pre-Surgery Clear Ensure.  The day of surgery (if you have diabetes): Drink ONE (1) Gatorade 2 (G2) as directed. This drink was given to you during your hospital  pre-op appointment visit.  The pre-op nurse will instruct you on the time to drink the   Gatorade 2 (G2) depending on your surgery time. Color of the Gatorade may vary. Red is not allowed. Nothing else to drink after completing the  Gatorade 2 (G2).         If you have questions, please contact your surgeon's office.  Surgical soap given to patient, instructions given, patient verbalized understanding.  

## 2022-04-30 NOTE — Anesthesia Preprocedure Evaluation (Addendum)
Anesthesia Evaluation  Patient identified by MRN, date of birth, ID band Patient awake    Reviewed: Allergy & Precautions, NPO status , Patient's Chart, lab work & pertinent test results  History of Anesthesia Complications Negative for: history of anesthetic complications  Airway Mallampati: II  TM Distance: >3 FB Neck ROM: Full    Dental  (+) Dental Advisory Given, Teeth Intact   Pulmonary neg pulmonary ROS   breath sounds clear to auscultation       Cardiovascular negative cardio ROS  Rhythm:Regular Rate:Normal  '22 ECHO: EF 60-65%. The LV has normal function, no regional wall motion abnormalities. There is mild LVH, Grade I DD, no significant valvular abnormalities    Neuro/Psych negative neurological ROS     GI/Hepatic negative GI ROS, Neg liver ROS,,,  Endo/Other  BMI 30.2  Renal/GU Renal InsufficiencyRenal disease     Musculoskeletal   Abdominal   Peds  Hematology negative hematology ROS (+)   Anesthesia Other Findings H/o breast cancer  Reproductive/Obstetrics                              Anesthesia Physical Anesthesia Plan  ASA: 2  Anesthesia Plan:    Post-op Pain Management: Tylenol PO (pre-op)*   Induction: Intravenous  PONV Risk Score and Plan: 2 and Ondansetron and Dexamethasone  Airway Management Planned: Oral ETT  Additional Equipment: None  Intra-op Plan:   Post-operative Plan: Extubation in OR  Informed Consent: I have reviewed the patients History and Physical, chart, labs and discussed the procedure including the risks, benefits and alternatives for the proposed anesthesia with the patient or authorized representative who has indicated his/her understanding and acceptance.     Dental advisory given  Plan Discussed with: CRNA and Surgeon  Anesthesia Plan Comments:          Anesthesia Quick Evaluation

## 2022-05-01 ENCOUNTER — Encounter (HOSPITAL_BASED_OUTPATIENT_CLINIC_OR_DEPARTMENT_OTHER): Payer: Self-pay | Admitting: Plastic Surgery

## 2022-05-01 ENCOUNTER — Other Ambulatory Visit: Payer: Self-pay

## 2022-05-01 ENCOUNTER — Ambulatory Visit (HOSPITAL_BASED_OUTPATIENT_CLINIC_OR_DEPARTMENT_OTHER): Payer: Managed Care, Other (non HMO) | Admitting: Anesthesiology

## 2022-05-01 ENCOUNTER — Encounter (HOSPITAL_BASED_OUTPATIENT_CLINIC_OR_DEPARTMENT_OTHER)
Admission: RE | Disposition: A | Discharge status: Home / Self Care | Payer: Self-pay | Source: Home / Self Care | Attending: Plastic Surgery

## 2022-05-01 ENCOUNTER — Ambulatory Visit (HOSPITAL_BASED_OUTPATIENT_CLINIC_OR_DEPARTMENT_OTHER)
Admission: RE | Admit: 2022-05-01 | Discharge: 2022-05-01 | Disposition: A | Discharge status: Home / Self Care | Payer: Managed Care, Other (non HMO) | Attending: Plastic Surgery | Admitting: Plastic Surgery

## 2022-05-01 DIAGNOSIS — Z853 Personal history of malignant neoplasm of breast: Secondary | ICD-10-CM | POA: Diagnosis not present

## 2022-05-01 DIAGNOSIS — Z923 Personal history of irradiation: Secondary | ICD-10-CM | POA: Insufficient documentation

## 2022-05-01 DIAGNOSIS — N6489 Other specified disorders of breast: Secondary | ICD-10-CM | POA: Diagnosis not present

## 2022-05-01 DIAGNOSIS — Z421 Encounter for breast reconstruction following mastectomy: Secondary | ICD-10-CM

## 2022-05-01 DIAGNOSIS — Z79899 Other long term (current) drug therapy: Secondary | ICD-10-CM | POA: Diagnosis not present

## 2022-05-01 DIAGNOSIS — Z45811 Encounter for adjustment or removal of right breast implant: Secondary | ICD-10-CM | POA: Diagnosis present

## 2022-05-01 DIAGNOSIS — N651 Disproportion of reconstructed breast: Secondary | ICD-10-CM

## 2022-05-01 DIAGNOSIS — C50911 Malignant neoplasm of unspecified site of right female breast: Secondary | ICD-10-CM

## 2022-05-01 DIAGNOSIS — Z9221 Personal history of antineoplastic chemotherapy: Secondary | ICD-10-CM | POA: Insufficient documentation

## 2022-05-01 HISTORY — PX: REMOVAL OF TISSUE EXPANDER AND PLACEMENT OF IMPLANT: SHX6457

## 2022-05-01 HISTORY — PX: BREAST REDUCTION SURGERY: SHX8

## 2022-05-01 SURGERY — REMOVAL, TISSUE EXPANDER, BREAST, WITH IMPLANT INSERTION
Anesthesia: General | Site: Breast | Laterality: Right

## 2022-05-01 MED ORDER — FENTANYL CITRATE (PF) 100 MCG/2ML IJ SOLN
INTRAMUSCULAR | Status: DC | PRN
  Administered 2022-05-01: 25 ug via INTRAVENOUS
  Administered 2022-05-01: 50 ug via INTRAVENOUS
  Administered 2022-05-01: 100 ug via INTRAVENOUS
  Administered 2022-05-01: 25 ug via INTRAVENOUS

## 2022-05-01 MED ORDER — 0.9 % SODIUM CHLORIDE (POUR BTL) OPTIME
TOPICAL | Status: DC | PRN
  Administered 2022-05-01: 1000 mL

## 2022-05-01 MED ORDER — OXYCODONE HCL 5 MG PO TABS
ORAL_TABLET | ORAL | Status: AC
  Filled 2022-05-01: qty 1

## 2022-05-01 MED ORDER — DEXAMETHASONE SODIUM PHOSPHATE 10 MG/ML IJ SOLN
INTRAMUSCULAR | Status: AC
  Filled 2022-05-01: qty 1

## 2022-05-01 MED ORDER — HYDROMORPHONE HCL 1 MG/ML IJ SOLN
INTRAMUSCULAR | Status: AC
  Filled 2022-05-01: qty 0.5

## 2022-05-01 MED ORDER — HYDROMORPHONE HCL 1 MG/ML IJ SOLN
0.2500 mg | INTRAMUSCULAR | Status: DC | PRN
  Administered 2022-05-01 (×2): 0.5 mg via INTRAVENOUS

## 2022-05-01 MED ORDER — SCOPOLAMINE 1 MG/3DAYS TD PT72
1.0000 | MEDICATED_PATCH | TRANSDERMAL | Status: DC
  Administered 2022-05-01: 1.5 mg via TRANSDERMAL

## 2022-05-01 MED ORDER — ONDANSETRON HCL 4 MG/2ML IJ SOLN
INTRAMUSCULAR | Status: AC
  Filled 2022-05-01: qty 2

## 2022-05-01 MED ORDER — MIDAZOLAM HCL 2 MG/2ML IJ SOLN: 0.5000 mg | Freq: Once | INTRAMUSCULAR | Status: DC | PRN

## 2022-05-01 MED ORDER — PHENYLEPHRINE HCL (PRESSORS) 10 MG/ML IV SOLN
INTRAVENOUS | Status: DC | PRN
  Administered 2022-05-01: 40 ug via INTRAVENOUS
  Administered 2022-05-01: 80 ug via INTRAVENOUS

## 2022-05-01 MED ORDER — CEFAZOLIN SODIUM-DEXTROSE 2-4 GM/100ML-% IV SOLN
INTRAVENOUS | Status: AC
  Filled 2022-05-01: qty 100

## 2022-05-01 MED ORDER — SUCCINYLCHOLINE CHLORIDE 200 MG/10ML IV SOSY
PREFILLED_SYRINGE | INTRAVENOUS | Status: AC
  Filled 2022-05-01: qty 10

## 2022-05-01 MED ORDER — LIDOCAINE HCL (CARDIAC) PF 100 MG/5ML IV SOSY
PREFILLED_SYRINGE | INTRAVENOUS | Status: DC | PRN
  Administered 2022-05-01: 60 mg via INTRAVENOUS

## 2022-05-01 MED ORDER — LIDOCAINE HCL 1 % IJ SOLN
INTRAVENOUS | Status: DC | PRN
  Administered 2022-05-01: 100 mL

## 2022-05-01 MED ORDER — ATROPINE SULFATE 0.4 MG/ML IV SOLN
INTRAVENOUS | Status: AC
  Filled 2022-05-01: qty 1

## 2022-05-01 MED ORDER — CEFAZOLIN SODIUM-DEXTROSE 2-4 GM/100ML-% IV SOLN
2.0000 g | INTRAVENOUS | Status: AC
  Administered 2022-05-01: 2 g via INTRAVENOUS

## 2022-05-01 MED ORDER — SODIUM CHLORIDE 0.9 % IV SOLN
INTRAVENOUS | Status: AC
  Filled 2022-05-01: qty 10

## 2022-05-01 MED ORDER — ROCURONIUM BROMIDE 100 MG/10ML IV SOLN
INTRAVENOUS | Status: DC | PRN
  Administered 2022-05-01: 60 mg via INTRAVENOUS

## 2022-05-01 MED ORDER — LIDOCAINE HCL (PF) 1 % IJ SOLN
INTRAMUSCULAR | Status: AC
  Filled 2022-05-01: qty 60

## 2022-05-01 MED ORDER — SCOPOLAMINE 1 MG/3DAYS TD PT72
MEDICATED_PATCH | TRANSDERMAL | Status: AC
  Filled 2022-05-01: qty 1

## 2022-05-01 MED ORDER — EPINEPHRINE PF 1 MG/ML IJ SOLN
INTRAMUSCULAR | Status: AC
  Filled 2022-05-01: qty 1

## 2022-05-01 MED ORDER — MIDAZOLAM HCL 2 MG/2ML IJ SOLN
INTRAMUSCULAR | Status: AC
  Filled 2022-05-01: qty 2

## 2022-05-01 MED ORDER — ACETAMINOPHEN 500 MG PO TABS
ORAL_TABLET | ORAL | Status: AC
  Filled 2022-05-01: qty 2

## 2022-05-01 MED ORDER — ONDANSETRON HCL 4 MG/2ML IJ SOLN
INTRAMUSCULAR | Status: DC | PRN
  Administered 2022-05-01: 4 mg via INTRAVENOUS

## 2022-05-01 MED ORDER — MEPERIDINE HCL 25 MG/ML IJ SOLN: 6.2500 mg | INTRAMUSCULAR | Status: DC | PRN

## 2022-05-01 MED ORDER — SUGAMMADEX SODIUM 200 MG/2ML IV SOLN
INTRAVENOUS | Status: DC | PRN
  Administered 2022-05-01: 200 mg via INTRAVENOUS

## 2022-05-01 MED ORDER — SODIUM CHLORIDE 0.9 % IV SOLN
INTRAVENOUS | Status: DC | PRN
  Administered 2022-05-01: 500 mL

## 2022-05-01 MED ORDER — LACTATED RINGERS IV SOLN: INTRAVENOUS | Status: DC

## 2022-05-01 MED ORDER — PROPOFOL 10 MG/ML IV BOLUS
INTRAVENOUS | Status: DC | PRN
  Administered 2022-05-01: 200 mg via INTRAVENOUS

## 2022-05-01 MED ORDER — OXYCODONE HCL 5 MG/5ML PO SOLN: 5.0000 mg | Freq: Once | ORAL | Status: AC | PRN

## 2022-05-01 MED ORDER — EPHEDRINE 5 MG/ML INJ
INTRAVENOUS | Status: AC
  Filled 2022-05-01: qty 5

## 2022-05-01 MED ORDER — OXYCODONE HCL 5 MG PO TABS
5.0000 mg | ORAL_TABLET | Freq: Once | ORAL | Status: AC | PRN
  Administered 2022-05-01: 5 mg via ORAL

## 2022-05-01 MED ORDER — DEXAMETHASONE SODIUM PHOSPHATE 4 MG/ML IJ SOLN
INTRAMUSCULAR | Status: DC | PRN
  Administered 2022-05-01: 5 mg via INTRAVENOUS

## 2022-05-01 MED ORDER — FENTANYL CITRATE (PF) 100 MCG/2ML IJ SOLN
INTRAMUSCULAR | Status: AC
  Filled 2022-05-01: qty 2

## 2022-05-01 MED ORDER — LIDOCAINE 2% (20 MG/ML) 5 ML SYRINGE
INTRAMUSCULAR | Status: AC
  Filled 2022-05-01: qty 5

## 2022-05-01 MED ORDER — PROMETHAZINE HCL 25 MG/ML IJ SOLN: 6.2500 mg | INTRAMUSCULAR | Status: DC | PRN

## 2022-05-01 MED ORDER — PHENYLEPHRINE HCL (PRESSORS) 10 MG/ML IV SOLN
INTRAVENOUS | Status: AC
  Filled 2022-05-01: qty 1

## 2022-05-01 MED ORDER — PHENYLEPHRINE 80 MCG/ML (10ML) SYRINGE FOR IV PUSH (FOR BLOOD PRESSURE SUPPORT)
PREFILLED_SYRINGE | INTRAVENOUS | Status: AC
  Filled 2022-05-01: qty 10

## 2022-05-01 MED ORDER — ACETAMINOPHEN 500 MG PO TABS
1000.0000 mg | ORAL_TABLET | Freq: Once | ORAL | Status: AC
  Administered 2022-05-01: 1000 mg via ORAL

## 2022-05-01 MED ORDER — PHENYLEPHRINE HCL-NACL 20-0.9 MG/250ML-% IV SOLN
INTRAVENOUS | Status: DC | PRN
  Administered 2022-05-01: 40 ug/min via INTRAVENOUS

## 2022-05-01 MED ORDER — MIDAZOLAM HCL 5 MG/5ML IJ SOLN
INTRAMUSCULAR | Status: DC | PRN
  Administered 2022-05-01: 2 mg via INTRAVENOUS

## 2022-05-01 SURGICAL SUPPLY — 98 items
ADH SKN CLS APL DERMABOND .7 (GAUZE/BANDAGES/DRESSINGS) ×8
APL PRP STRL LF DISP 70% ISPRP (MISCELLANEOUS) ×4
BAG DECANTER FOR FLEXI CONT (MISCELLANEOUS) ×3 IMPLANT
BINDER ABDOMINAL  9 SM 30-45 (SOFTGOODS)
BINDER ABDOMINAL 10 UNV 27-48 (MISCELLANEOUS) IMPLANT
BINDER ABDOMINAL 12 SM 30-45 (SOFTGOODS) IMPLANT
BINDER ABDOMINAL 9 SM 30-45 (SOFTGOODS) IMPLANT
BINDER BREAST 3XL (GAUZE/BANDAGES/DRESSINGS) IMPLANT
BINDER BREAST LRG (GAUZE/BANDAGES/DRESSINGS) IMPLANT
BINDER BREAST MEDIUM (GAUZE/BANDAGES/DRESSINGS) IMPLANT
BINDER BREAST XLRG (GAUZE/BANDAGES/DRESSINGS) IMPLANT
BINDER BREAST XXLRG (GAUZE/BANDAGES/DRESSINGS) IMPLANT
BLADE SURG 10 STRL SS (BLADE) ×18 IMPLANT
BLADE SURG 15 STRL LF DISP TIS (BLADE) ×3 IMPLANT
BLADE SURG 15 STRL SS (BLADE) ×2
BNDG GAUZE DERMACEA FLUFF 4 (GAUZE/BANDAGES/DRESSINGS) ×6 IMPLANT
BNDG GZE DERMACEA 4 6PLY (GAUZE/BANDAGES/DRESSINGS)
CANISTER SUCT 1200ML W/VALVE (MISCELLANEOUS) ×3 IMPLANT
CHLORAPREP W/TINT 26 (MISCELLANEOUS) ×6 IMPLANT
COVER BACK TABLE 60X90IN (DRAPES) ×3 IMPLANT
COVER MAYO STAND STRL (DRAPES) ×3 IMPLANT
DERMABOND ADVANCED .7 DNX12 (GAUZE/BANDAGES/DRESSINGS) ×6 IMPLANT
DRAIN CHANNEL 15F RND FF W/TCR (WOUND CARE) IMPLANT
DRAIN CHANNEL 19F RND (DRAIN) IMPLANT
DRAPE LAPAROSCOPIC ABDOMINAL (DRAPES) ×3 IMPLANT
DRAPE UTILITY XL STRL (DRAPES) ×3 IMPLANT
ELECT BLADE 4.0 EZ CLEAN MEGAD (MISCELLANEOUS) ×2
ELECT COATED BLADE 2.86 ST (ELECTRODE) IMPLANT
ELECT REM PT RETURN 9FT ADLT (ELECTROSURGICAL) ×2
ELECTRODE BLDE 4.0 EZ CLN MEGD (MISCELLANEOUS) ×3 IMPLANT
ELECTRODE REM PT RTRN 9FT ADLT (ELECTROSURGICAL) ×6 IMPLANT
EVACUATOR SILICONE 100CC (DRAIN) IMPLANT
FUNNEL KELLER 2 DISP (MISCELLANEOUS) IMPLANT
GAUZE PAD ABD 8X10 STRL (GAUZE/BANDAGES/DRESSINGS) ×6 IMPLANT
GAUZE SPONGE 4X4 12PLY STRL (GAUZE/BANDAGES/DRESSINGS) ×3 IMPLANT
GLOVE BIO SURGEON STRL SZ 6 (GLOVE) ×6 IMPLANT
GLOVE BIO SURGEON STRL SZ8 (GLOVE) ×6 IMPLANT
GLOVE BIOGEL M STRL SZ7.5 (GLOVE) ×3 IMPLANT
GLOVE BIOGEL PI IND STRL 7.5 (GLOVE) ×3 IMPLANT
GLOVE BIOGEL PI IND STRL 8 (GLOVE) ×3 IMPLANT
GOWN STRL REUS W/ TWL LRG LVL3 (GOWN DISPOSABLE) ×6 IMPLANT
GOWN STRL REUS W/ TWL XL LVL3 (GOWN DISPOSABLE) ×3 IMPLANT
GOWN STRL REUS W/TWL LRG LVL3 (GOWN DISPOSABLE) ×12
GOWN STRL REUS W/TWL XL LVL3 (GOWN DISPOSABLE) ×6 IMPLANT
IMPL GEL 500CC (Breast) IMPLANT
IMPLANT GEL 500CC (Breast) ×2 IMPLANT
IV NS 500ML (IV SOLUTION)
IV NS 500ML BAXH (IV SOLUTION) IMPLANT
KIT FILL ASEPTIC TRANSFER (MISCELLANEOUS) IMPLANT
LINER CANISTER 1000CC FLEX (MISCELLANEOUS) ×3 IMPLANT
MARKER SKIN DUAL TIP RULER LAB (MISCELLANEOUS) IMPLANT
NDL FILTER BLUNT 18X1 1/2 (NEEDLE) ×3 IMPLANT
NDL HYPO 25X1 1.5 SAFETY (NEEDLE) ×3 IMPLANT
NDL SAFETY ECLIP 18X1.5 (MISCELLANEOUS) ×3 IMPLANT
NDL SPNL 18GX3.5 QUINCKE PK (NEEDLE) ×3 IMPLANT
NEEDLE FILTER BLUNT 18X1 1/2 (NEEDLE) ×2 IMPLANT
NEEDLE HYPO 25X1 1.5 SAFETY (NEEDLE) ×2 IMPLANT
NEEDLE SPNL 18GX3.5 QUINCKE PK (NEEDLE) ×2 IMPLANT
NS IRRIG 1000ML POUR BTL (IV SOLUTION) ×3 IMPLANT
PACK BASIN DAY SURGERY FS (CUSTOM PROCEDURE TRAY) ×3 IMPLANT
PENCIL SMOKE EVACUATOR (MISCELLANEOUS) ×3 IMPLANT
PIN SAFETY STERILE (MISCELLANEOUS) IMPLANT
RETRACTOR ONETRAX LX 135X30 (MISCELLANEOUS) IMPLANT
RETRACTOR ONETRAX LX 90X20 (MISCELLANEOUS) IMPLANT
SHEET MEDIUM DRAPE 40X70 STRL (DRAPES) IMPLANT
SIZER BREAST HP REUSE 500CC (SIZER) ×2
SIZER BRST HP REUSE 500CC (SIZER) IMPLANT
SLEEVE SCD COMPRESS KNEE MED (STOCKING) ×3 IMPLANT
SPIKE FLUID TRANSFER (MISCELLANEOUS) IMPLANT
SPONGE T-LAP 18X18 ~~LOC~~+RFID (SPONGE) ×9 IMPLANT
STAPLER INSORB 30 2030 C-SECTI (MISCELLANEOUS) ×3 IMPLANT
STAPLER VISISTAT 35W (STAPLE) ×3 IMPLANT
STRIP SUTURE WOUND CLOSURE 1/2 (MISCELLANEOUS) ×6 IMPLANT
SUT ETHILON 2 0 FS 18 (SUTURE) IMPLANT
SUT MNCRL AB 3-0 PS2 18 (SUTURE) ×12 IMPLANT
SUT MNCRL AB 4-0 PS2 18 (SUTURE) ×6 IMPLANT
SUT MON AB 2-0 CT1 36 (SUTURE) ×6 IMPLANT
SUT MON AB 5-0 PS2 18 (SUTURE) IMPLANT
SUT PDS 3-0 CT2 (SUTURE)
SUT PDS II 3-0 CT2 27 ABS (SUTURE) ×6 IMPLANT
SUT SILK 2 0 SH (SUTURE) ×6 IMPLANT
SUT VIC AB 3-0 PS1 18 (SUTURE)
SUT VIC AB 3-0 PS1 18XBRD (SUTURE) ×6 IMPLANT
SUT VIC AB 3-0 SH 27 (SUTURE) ×10
SUT VIC AB 3-0 SH 27X BRD (SUTURE) IMPLANT
SUT VLOC 180 0 24IN GS25 (SUTURE) IMPLANT
SUT VLOC 90 P-14 23 (SUTURE) ×6 IMPLANT
SYR 50ML LL SCALE MARK (SYRINGE) ×3 IMPLANT
SYR BULB IRRIG 60ML STRL (SYRINGE) ×6 IMPLANT
SYR CONTROL 10ML LL (SYRINGE) ×3 IMPLANT
SYR TB 1ML LL NO SAFETY (SYRINGE) IMPLANT
TAPE MEASURE VINYL STERILE (MISCELLANEOUS) IMPLANT
TOWEL GREEN STERILE FF (TOWEL DISPOSABLE) ×6 IMPLANT
TUBE CONNECTING 20X1/4 (TUBING) ×3 IMPLANT
TUBING INFILTRATION IT-10001 (TUBING) ×3 IMPLANT
TUBING SET GRADUATE ASPIR 12FT (MISCELLANEOUS) ×3 IMPLANT
UNDERPAD 30X36 HEAVY ABSORB (UNDERPADS AND DIAPERS) ×6 IMPLANT
YANKAUER SUCT BULB TIP NO VENT (SUCTIONS) ×3 IMPLANT

## 2022-05-01 NOTE — Discharge Instructions (Addendum)
Post Anesthesia Home Care Instructions  Activity: Get plenty of rest for the remainder of the day. A responsible individual must stay with you for 24 hours following the procedure.  For the next 24 hours, DO NOT: -Drive a car -Paediatric nurse -Drink alcoholic beverages -Take any medication unless instructed by your physician -Make any legal decisions or sign important papers.  Meals: Start with liquid foods such as gelatin or soup. Progress to regular foods as tolerated. Avoid greasy, spicy, heavy foods. If nausea and/or vomiting occur, drink only clear liquids until the nausea and/or vomiting subsides. Call your physician if vomiting continues.  Special Instructions/Symptoms: Your throat may feel dry or sore from the anesthesia or the breathing tube placed in your throat during surgery. If this causes discomfort, gargle with warm salt water. The discomfort should disappear within 24 hours.  If you had a scopolamine patch placed behind your ear for the management of post- operative nausea and/or vomiting:  1. The medication in the patch is effective for 72 hours, after which it should be removed.  Wrap patch in a tissue and discard in the trash. Wash hands thoroughly with soap and water. 2. You may remove the patch earlier than 72 hours if you experience unpleasant side effects which may include dry mouth, dizziness or visual disturbances. 3. Avoid touching the patch. Wash your hands with soap and water after contact with the patch.     INSTRUCTIONS FOR AFTER BREAST SURGERY   You will likely have some questions about what to expect following your operation.  The following information will help you and your family understand what to expect when you are discharged from the hospital.  It is important to follow these guidelines to help ensure a smooth recovery and reduce complication.  Postoperative instructions include information on: diet, wound care, medications and physical  activity.  AFTER SURGERY Expect to go home after the procedure.  In some cases, you may need to spend one night in the hospital for observation.  DIET Breast surgery does not require a specific diet.  However, the healthier you eat the better your body will heal. It is important to increasing your protein intake.  This means limiting the foods with sugar and carbohydrates.  Focus on vegetables and some meat.  If you have liposuction during your procedure be sure to drink water.  If your urine is bright yellow, then it is concentrated, and you need to drink more water.  As a general rule after surgery, you should have 8 ounces of water every hour while awake.  If you find you are persistently nauseated or unable to take in liquids let us know.  NO TOBACCO USE or EXPOSURE.  This will slow your healing process and lead to a wound.  WOUND CARE Leave the binder on for 3 days . Use fragrance free soap like Dial, Dove or [place].   After 3 days you can remove the binder to shower. Once dry apply binder or sports bra. If you have liposuction you will have a soft and spongy dressing (Lipofoam) that helps prevent creases in your skin.  Remove before you shower and then replace it.  It is also available on [employer]. If you have steri-strips / tape directly attached to your skin leave them in place. It is OK to get these wet.   No baths, pools or hot tubs for four weeks. We close your incision to leave the smallest and best-looking scar. No ointment or creams on your  incisions for four weeks.  No Neosporin (Too many skin reactions).  A few weeks after surgery you can use Mederma and start massaging the scar. We ask you to wear your binder or sports bra for the first 6 weeks around the clock, including while sleeping. This provides added comfort and helps reduce the fluid accumulation at the surgery site. No Ice or heating pads to the operative site.  You have a very high risk of a burn before you feel the  temperature change.  ACTIVITY No heavy lifting until cleared by the doctor.  This usually means no more than a half-gallon of milk.  It is OK to walk and climb stairs. Moving your legs is very important to decrease your risk of a blood clot.  It will also help keep you from getting deconditioned.  Every 1 to 2 hours get up and walk for 5 minutes. This will help with a quicker recovery back to normal.  Let pain be your guide so you don't do too much.  This time is for you to recover.  You will be more comfortable if you sleep and rest with your head elevated either with a few pillows under you or in a recliner.  No stomach sleeping for a three months.  WORK Everyone returns to work at different times. As a rough guide, most people take at least 1 - 2 weeks off prior to returning to work. If you need documentation for your job, give the forms to the front staff at the clinic.  DRIVING Arrange for someone to bring you home from the hospital after your surgery.  You may be able to drive a few days after surgery but not while taking any narcotics or valium.  BOWEL MOVEMENTS Constipation can occur after anesthesia and while taking pain medication.  It is important to stay ahead for your comfort.  We recommend taking Milk of Magnesia (2 tablespoons; twice a day) while taking the pain pills.  MEDICATIONS You may be prescribed should start after surgery At your preoperative visit for you history and physical you may have been given the following medications: Oxycodone 5 mg:  This is only to be used after you have taken the Motrin or the Tylenol. Every 8 hours as needed.   Over the counter Medication to take: Ibuprofen (Motrin) 600 mg:  Take this every 6 hours.  If you have additional pain then take 500 mg of the Tylenol every 8 hours.  Only take the Norco after you have tried these two. MiraLAX or Milk of Magnesia: Take this according to the bottle if you take the Norco.   Continue to hold your  verzenio 1 week after surgery and anastrozole as discussed and directed at your preoperative appointment. If you have questions regarding these medications please contact [facility].   WHEN TO CALL Call your surgeon's office if any of the following occur: Fever 101 degrees F or greater Excessive bleeding or fluid from the incision site. Pain that increases over time without aid from the medications Redness, warmth, or pus draining from incision sites Persistent nausea or inability to take in liquids Severe misshapen area that underwent the operation.  Here are some resources for breast cancer patients:  Plastic surgery website: https://www.plasticsurgery.org/for-medical-professionals/education-and-resources/publications/breast-reconstruction-magazine Breast Reconstruction Awareness Campaign:  [employer] Plastic surgery Implant information:  https://www.plasticsurgery.org/patient-safety/breast-implant-safety

## 2022-05-01 NOTE — Interval H&P Note (Signed)
History and Physical Interval Note: Pt seen and marked. No change to physical exam or indications for surgery.  All questions answered.  Will proceed at her request  05/01/2022 7:21 AM  Emma Cuevas  has presented today for surgery, with the diagnosis of Malignant neoplasm of right female breast, unspecified estrogen receptor status, unspecified site of breast.  The various methods of treatment have been discussed with the patient and family. After consideration of risks, benefits and other options for treatment, the patient has consented to  Procedure(s): REMOVAL OF TISSUE EXPANDER AND PLACEMENT OF IMPLANT (Right) BREAST REDUCTION WITH LIPOSUCTION (Left) as a surgical intervention.  The patient's history has been reviewed, patient examined, no change in status, stable for surgery.  I have reviewed the patient's chart and labs.  Questions were answered to the patient's satisfaction.     [name]

## 2022-05-01 NOTE — Interval H&P Note (Signed)
History and Physical Interval Note:  05/01/2022 7:20 AM  Emma Cuevas  has presented today for surgery, with the diagnosis of Malignant neoplasm of right female breast, unspecified estrogen receptor status, unspecified site of breast.  The various methods of treatment have been discussed with the patient and family. After consideration of risks, benefits and other options for treatment, the patient has consented to  Procedure(s): REMOVAL OF TISSUE EXPANDER AND PLACEMENT OF IMPLANT (Right) BREAST REDUCTION WITH LIPOSUCTION (Left) as a surgical intervention.  The patient's history has been reviewed, patient examined, no change in status, stable for surgery.  I have reviewed the patient's chart and labs.  Questions were answered to the patient's satisfaction.     Emma Cuevas

## 2022-05-01 NOTE — Anesthesia Postprocedure Evaluation (Signed)
Anesthesia Post Note  Patient: Emma Cuevas  Procedure(s) Performed: REMOVAL OF TISSUE EXPANDER AND PLACEMENT OF IMPLANT (Right: Breast) MAMMARY REDUCTION  (BREAST) (Left: Breast)     Patient location during evaluation: PACU Anesthesia Type: General Level of consciousness: awake and alert, patient cooperative and oriented Pain management: pain level controlled Vital Signs Assessment: post-procedure vital signs reviewed and stable Respiratory status: spontaneous breathing, nonlabored ventilation and respiratory function stable Cardiovascular status: blood pressure returned to baseline and stable Postop Assessment: no apparent nausea or vomiting, able to ambulate and adequate PO intake Anesthetic complications: no   No notable events documented.  Last Vitals:  Vitals:   05/01/22 1215 05/01/22 1248  BP:  109/67  Pulse:    Resp:  20  Temp:  37 C  SpO2: 96% 96%    Last Pain:  Vitals:   05/01/22 1248  TempSrc: Oral  PainSc: 3                  Emma Cuevas,E. Rozalia Dino

## 2022-05-01 NOTE — Transfer of Care (Signed)
Immediate Anesthesia Transfer of Care Note  Patient: Emma Cuevas [facility]  Procedure(s) Performed: REMOVAL OF TISSUE EXPANDER AND PLACEMENT OF IMPLANT (Right: Breast) MAMMARY REDUCTION  (BREAST) (Left: Breast)  Patient Location: PACU  Anesthesia Type:General  Level of Consciousness: awake, alert , oriented, drowsy, and patient cooperative  Airway & Oxygen Therapy: Patient Spontanous Breathing and Patient connected to face mask oxygen  Post-op Assessment: Report given to RN and Post -op Vital signs reviewed and stable  Post vital signs: Reviewed and stable  Last Vitals:  Vitals Value Taken Time  BP    Temp    Pulse    Resp    SpO2      Last Pain:  Vitals:   05/01/22 0630  TempSrc: Oral  PainSc: 0-No pain         Complications: No notable events documented.

## 2022-05-01 NOTE — Anesthesia Procedure Notes (Signed)
Procedure Name: Intubation Date/Time: 05/01/2022 7:44 AM  Performed by: Willa Frater, CRNAPre-anesthesia Checklist: Patient identified, Emergency Drugs available, Suction available and Patient being monitored Patient Re-evaluated:Patient Re-evaluated prior to induction Oxygen Delivery Method: Circle system utilized Preoxygenation: Pre-oxygenation with 100% oxygen Induction Type: IV induction Ventilation: Mask ventilation without difficulty Laryngoscope Size: Mac and 3 Grade View: Grade I Tube type: Oral Tube size: 7.0 mm Number of attempts: 1 Airway Equipment and Method: Stylet and Oral airway Placement Confirmation: ETT inserted through vocal cords under direct vision, positive ETCO2 and breath sounds checked- equal and bilateral Secured at: 22 cm Tube secured with: Tape Dental Injury: Teeth and Oropharynx as per pre-operative assessment

## 2022-05-01 NOTE — Op Note (Signed)
DATE OF OPERATION: 05/01/2022  LOCATION: [facility] operating Room  PREOPERATIVE DIAGNOSIS: Right breast cancer, breast reconstruction, breast asymmetry  POSTOPERATIVE DIAGNOSIS: Same  PROCEDURE: Removal of right tissue expander and replacement with a 500 cc high-profile Mentor gel implant, balancing reduction on the left  SURGEON: Jeanann Lewandowsky, MD  ASSISTANT: Donnamarie Rossetti  EBL: 50 cc  CONDITION: Stable  COMPLICATIONS: None  INDICATION: The patient, Emma Cuevas, is a 56 y.o. female born on [birth year], is here for treatment of breast reconstruction and breast asymmetry.   PROCEDURE DETAILS:  The patient was seen prior to surgery and marked.   IV antibiotics were given. The patient was taken to the operating room and given a general anesthetic. A standard time out was performed and all information was confirmed by those in the room. SCDs were placed.   The right breast was addressed first.  The previous scar was used and incision was made through the scar.  The acellular dermis was identified and dissected for approximately 3 cm superior to the skin incision.  The dermis was incised the tissue expander identified and the saline removed.  Tissue expander was removed.  The wound was irrigated with dilute Betadine.  A 500 cc gel sizer was placed patient had excellent shape and size.  The sizer was removed the 500 cc high-profile gel Mentor implant was opened and washed with antibiotic containing saline solution.  The skin was reprepped fresh green towels were placed and the surgical team changed their outer gloves.  The new implant was placed without difficulty the capsule was closed with interrupted 3-0 Vicryl sutures, the dermis was closed with interrupted 3-0 Monocryl sutures and the skin was closed with a 4-0 Monocryl running subcuticular stitch.  Dermabond was applied and attention was turned to the left breast.  The nipple areolar complex was marked with a 38 mm cookie cutter and an  8 cm based inferior pedicle was marked.  A laparotomy tape was placed at the base of the breast as a tourniquet and the pedicle was de-epithelialized sharply.  The pedicle was outlined with the electrocautery.  A minimal amount of tissue was removed medial and lateral to the pedicle and thick skin flaps were developed to the upper limits of the breast.  It was irrigated with saline meticulous hemostasis was achieved and the skin was tailor tacked over the pedicle.  I was happy with the size and shape and elected to proceed with closure the dermis was approximated with interrupted 3-0 Monocryl sutures a 42 mm cookie cutter was used to create a new opening for the nipple and areola.  The nipple areolar complex was brought through the new opening and the areolar dermis was sutured to the skin dermis with interrupted 3-0 Monocryl sutures all skin edges were then closed with running 4-0 Monocryl subcuticular stitches.  The skin edges were sealed with Histacol skin glue the patient was placed in a compressive garment and awakened from anesthesia without incident she was transferred to the recovery room in stable condition. The patient was allowed to wake up and taken to recovery room in stable condition at the end of the case. The family was notified at the end of the case.  All instrument needle and sponge counts were reported as correct.  The breast tissue which weighed approximately 230 g was sent to pathology for routine examination.  The advanced practice practitioner (APP), Ms. Donnamarie Rossetti, assisted throughout the case.  The APP was essential in retraction and counter  traction when needed to make the case progress smoothly.  This retraction and assistance made it possible to see the tissue plans for the procedure.  The assistance was needed for blood control, tissue re-approximation and assisted with closure of the incision site.

## 2022-05-04 ENCOUNTER — Encounter: Payer: Managed Care, Other (non HMO) | Admitting: Physician Assistant

## 2022-05-04 ENCOUNTER — Other Ambulatory Visit: Payer: Managed Care, Other (non HMO)

## 2022-05-04 ENCOUNTER — Ambulatory Visit: Payer: Managed Care, Other (non HMO) | Admitting: Oncology

## 2022-05-04 LAB — SURGICAL PATHOLOGY

## 2022-05-06 ENCOUNTER — Telehealth: Payer: Managed Care, Other (non HMO) | Admitting: Student

## 2022-05-06 ENCOUNTER — Encounter: Payer: Self-pay | Admitting: Student

## 2022-05-06 ENCOUNTER — Ambulatory Visit (INDEPENDENT_AMBULATORY_CARE_PROVIDER_SITE_OTHER): Payer: Managed Care, Other (non HMO) | Admitting: Student

## 2022-05-06 VITALS — BP 130/70 | HR 80 | Temp 97.6°F

## 2022-05-06 DIAGNOSIS — Z9889 Other specified postprocedural states: Secondary | ICD-10-CM

## 2022-05-06 NOTE — Progress Notes (Signed)
Patient is a 56 year old female with history of right breast cancer.  She recently underwent removal of right tissue expander and replacement with a 500 cc high-profile Mentor gel implant and a balancing reduction on the left by Dr. Lovena Le on 05/01/2022.  Patient presents to the clinic today with concerns about a rash that she is experiencing.  Today, patient reports she developed a rash on Monday and feels that it has been worsening.  She states that she has had a reaction in the past to Tegaderm. She states that she noticed some red bumps to her right axillary region and to the middle of her chest.  She states that they are itchy.  Patient states she has not tried taking any medications or tried any creams to the area.  Patient reports that pain has been controlled and that she has not needed to take any medications for pain.  She denies any fevers or chills.  She denies any sensation of her throat closing, drooling or difficulty breathing.  She states she is otherwise doing well.   Vitals were obtained at today's visit.  Vitals were stable and patient is afebrile. Vitals:   05/06/22 1006  BP: 130/70  Pulse: 80  Temp: 97.6 F (36.4 C)  SpO2: 97%     Chaperone present on exam.  On exam, patient is sitting upright in no acute distress.  There is a mild macular papular rash noted to the skin near the right lateral chest wall and to the medial/superior chest wall.  To the left breast, there is ecchymosis noted surrounding the incisions.  NAC appears to be viable.  Breast is soft and there are no significant fluid collections palpated on exam.  Incision appears to be irritated with some mild redness and small blisters surrounding the incision.  It appears to be consistent with wherever Dermabond was placed over the incision.  There is no drainage noted on exam.  To the right breast, there is no overlying erythema to the skin.  Right breast implant is in place.  It is soft.  There is no ecchymosis noted.   Incision appears to be intact with some irritation and a small blister just inferior to the middle of the incision.  There is no drainage or open wounds noted at this time.  I discussed with the patient that she can apply hydrocortisone cream to the small macular papular rashes to her right axillary area and to the medial/superior aspect of her chest.  I discussed with patient she should not apply hydrocortisone cream to the incisions.  I discussed with the patient that instead, she can apply Eucerin or another nonfragrant/noncolored/hypoallergenic cream or lotion to the incisions to help with the irritation and to help remove the Dermabond.  Patient expressed understanding.  I also recommended to the patient that she can start taking an antihistamine such as Claritin or Zyrtec as well as Pepcid to help clear up the rash.  Patient expressed understanding.  I discussed with the patient that she will need to monitor the surgical sites closely.  I discussed with the patient that if she develops any worsening rashes, open wounds, draining wounds, redness over either breast, worsening swelling, fevers or chills, or has any concerns at all, she needs to follow-up with [place] sooner than her next appointment.  Patient expressed understanding.  I discussed with the patient that if she experiences any sensation of her throat closing, drooling, any difficulty breathing, she needs to be evaluated in the emergency room.  Patient expressed understanding.  I discussed with the patient that we will have to follow her closely in clinic.  Patient is to follow-up on Monday.  I instructed the patient to call in the meantime with any questions or concerns.  Pictures were obtained of the patient and placed in the chart with the patient's or guardian's permission.   Dr. Lovena Le also had the opportunity to examine the patient and discussed the plan with her.

## 2022-05-07 ENCOUNTER — Other Ambulatory Visit: Payer: Self-pay | Admitting: Oncology

## 2022-05-09 ENCOUNTER — Encounter: Payer: Self-pay | Admitting: Dermatology

## 2022-05-11 ENCOUNTER — Encounter: Payer: Self-pay | Admitting: Student

## 2022-05-11 ENCOUNTER — Other Ambulatory Visit: Payer: Self-pay

## 2022-05-11 ENCOUNTER — Ambulatory Visit (INDEPENDENT_AMBULATORY_CARE_PROVIDER_SITE_OTHER): Payer: Managed Care, Other (non HMO) | Admitting: Physician Assistant

## 2022-05-11 ENCOUNTER — Telehealth: Payer: Self-pay | Admitting: Student

## 2022-05-11 VITALS — BP 139/81 | HR 82

## 2022-05-11 DIAGNOSIS — Z9889 Other specified postprocedural states: Secondary | ICD-10-CM

## 2022-05-11 MED ORDER — PREDNISONE 50 MG PO TABS: ORAL_TABLET | ORAL | 0 refills | Status: DC

## 2022-05-11 NOTE — Progress Notes (Cosign Needed Addendum)
Ms. Osorto is a 77 YOF with a PMH of right sided breast cancer. She recently underwent removal of right tissue expander and placement with 500 cc high profile Mentor gel implant and left sided reduction by Dr. Lovena Le on 05/01/2022.  She was last seen in the clinic on 05/06/2022. At that time she had reports a rash to the incisions and surrounding skin. She was started on antihistamines at that time.   She notes that since that time she has not had any improvement in the rash and feels that it may be slightly worsening. She denies any new areas, no fevers.   Chaperone present. On exam the incisions sites are red with some surrounding satellite areas of small papules. She has a small wound at the left inferior T zone. No other redness or swelling noted, no warmth to touch. No areas of firmness. Left NAC viable, right mastectomy flaps are viable.   Photos taken today  The patients presentation is likely secondary to allergic reaction. At this time she does not have any signs of acute infection. Dr. Lovena Le also had the opportunity to evaluate the patient, at this time give the amount of reaction and no improvement we do feel a short course of prednisone would be indicated.  I have called in '50mg'$  prednisone to be taken daily for 5 days. She is scheduled to follow up in our clinic in three days. She was instructed to reach out to use if she develops any new or worsening signs or symptoms. She verbalized understanding and agreement to today's plan.

## 2022-05-11 NOTE — Telephone Encounter (Signed)
LVM and my chart message that PA will be in surgery this morning and unable to see her at 10am but could she come at 11am and to please contact our office asap.

## 2022-05-13 ENCOUNTER — Encounter: Payer: Managed Care, Other (non HMO) | Admitting: Plastic Surgery

## 2022-05-14 ENCOUNTER — Ambulatory Visit (INDEPENDENT_AMBULATORY_CARE_PROVIDER_SITE_OTHER): Payer: Managed Care, Other (non HMO) | Admitting: Plastic Surgery

## 2022-05-14 ENCOUNTER — Encounter: Payer: Self-pay | Admitting: Plastic Surgery

## 2022-05-14 VITALS — BP 127/78 | HR 91 | Ht 65.5 in | Wt 181.0 lb

## 2022-05-14 DIAGNOSIS — C50911 Malignant neoplasm of unspecified site of right female breast: Secondary | ICD-10-CM

## 2022-05-14 DIAGNOSIS — Z9889 Other specified postprocedural states: Secondary | ICD-10-CM

## 2022-05-14 NOTE — Progress Notes (Signed)
Emma Cuevas returns today for postoperative follow-up after her tissue expander to implant exchange and reduction on the left side.  She had a reaction to the Dermabond which ultimately required treatment with a short burst of steroids and removal of the Dermabond with topical moisturizing cream.  She is doing much better now, the rash is almost completely resolved and there is no erythema over the incisions.  She has excellent symmetry of the breasts, and she is happy with the result.  Will cancel her follow-up appointment next week and reschedule for the middle of January.  She of course will call if she has any questions or concerns.

## 2022-05-17 ENCOUNTER — Encounter: Payer: Self-pay | Admitting: Plastic Surgery

## 2022-05-17 MED ORDER — CLOBETASOL PROPIONATE 0.05 % EX CREA: 1.0000 | TOPICAL_CREAM | Freq: Two times a day (BID) | CUTANEOUS | 0 refills | Status: AC

## 2022-05-19 ENCOUNTER — Ambulatory Visit (INDEPENDENT_AMBULATORY_CARE_PROVIDER_SITE_OTHER): Payer: Managed Care, Other (non HMO) | Admitting: Plastic Surgery

## 2022-05-19 ENCOUNTER — Encounter: Payer: Self-pay | Admitting: Plastic Surgery

## 2022-05-19 VITALS — BP 117/77 | HR 88

## 2022-05-19 DIAGNOSIS — Z9889 Other specified postprocedural states: Secondary | ICD-10-CM

## 2022-05-19 MED ORDER — SULFAMETHOXAZOLE-TRIMETHOPRIM 800-160 MG PO TABS: 1.0000 | ORAL_TABLET | Freq: Two times a day (BID) | ORAL | 0 refills | Status: AC

## 2022-05-19 NOTE — Progress Notes (Signed)
Ms. Emma Cuevas returns today for evaluation of erythema of the right breast associated with drainage.  She underwent replacement of a tissue expander on the right side and balancing reduction on the left on December 8.  She has had issues with contact dermatitis and erythema on the left breast.  Last night she developed a significant amount of drainage from the left breast and now feels better with less erythema.  I suspect that she had a retained seroma which drained however given the significant morbidity if the breast implant becomes infected I have elected to treat her with 5 days of Septra DS.  Status post left breast reduction: Erythema and drainage from the breast.  Though she does not have any clear signs of infection I will treat her for a postoperative infection and she will will call the office on Thursday and follow-up then if she has had no improvement.

## 2022-05-20 ENCOUNTER — Encounter: Payer: Managed Care, Other (non HMO) | Admitting: Student

## 2022-05-21 ENCOUNTER — Encounter: Payer: Self-pay | Admitting: Plastic Surgery

## 2022-05-21 ENCOUNTER — Telehealth: Payer: Self-pay | Admitting: Student

## 2022-05-21 NOTE — Telephone Encounter (Signed)
Patient sent message via MyChart regarding an update on her symptoms.  I called and spoke with the patient regarding her symptoms.  Patient reports that last weekend she developed some redness to her left breast and then experienced a significant amount of drainage on Monday.  She was seen by Dr. Lovena Le on Tuesday and was found to have some erythema to the left breast.  5 days of Septra DS was started for the patient.    Patient states that on Tuesday night, she noticed a little bit of redness to the medial aspect of the left breast.  She states that this also began draining then, and reported the drainage was similar color and characteristic to what she had been draining before.  She states that now, the erythema has significantly improved to her left breast both medially and laterally and the drainage has significantly slowed down.  She denies any issues to her right breast.  She denies any fevers or chills.  Patient reports she has taken 4 doses of her Septra DS.  I discussed with the patient that it sounds like there is an improvement in her symptoms and we can plan to see her earlier next week.  Patient was in agreement with this.  I discussed with the patient to continue to monitor the area.  I discussed with her to call if there is any worsening redness or drainage, or if she develops any fevers or chills.  I instructed the patient to continue to take her antibiotics and to continue to use ABD pads for drainage and change as needed.  Patient expressed understanding.  Patient to follow-up on Tuesday.

## 2022-05-22 ENCOUNTER — Inpatient Hospital Stay: Payer: Managed Care, Other (non HMO) | Attending: Nurse Practitioner

## 2022-05-22 ENCOUNTER — Inpatient Hospital Stay (HOSPITAL_BASED_OUTPATIENT_CLINIC_OR_DEPARTMENT_OTHER): Payer: Managed Care, Other (non HMO) | Admitting: Oncology

## 2022-05-22 ENCOUNTER — Other Ambulatory Visit: Payer: Self-pay | Admitting: *Deleted

## 2022-05-22 VITALS — BP 105/69 | HR 79 | Temp 98.7°F | Resp 14 | Ht 65.5 in | Wt 180.8 lb

## 2022-05-22 DIAGNOSIS — Z170 Estrogen receptor positive status [ER+]: Secondary | ICD-10-CM | POA: Diagnosis not present

## 2022-05-22 DIAGNOSIS — C50011 Malignant neoplasm of nipple and areola, right female breast: Secondary | ICD-10-CM | POA: Insufficient documentation

## 2022-05-22 DIAGNOSIS — Z803 Family history of malignant neoplasm of breast: Secondary | ICD-10-CM | POA: Insufficient documentation

## 2022-05-22 DIAGNOSIS — Z79811 Long term (current) use of aromatase inhibitors: Secondary | ICD-10-CM

## 2022-05-22 DIAGNOSIS — Z86018 Personal history of other benign neoplasm: Secondary | ICD-10-CM | POA: Diagnosis not present

## 2022-05-22 DIAGNOSIS — N179 Acute kidney failure, unspecified: Secondary | ICD-10-CM | POA: Diagnosis not present

## 2022-05-22 DIAGNOSIS — Z800 Family history of malignant neoplasm of digestive organs: Secondary | ICD-10-CM | POA: Insufficient documentation

## 2022-05-22 DIAGNOSIS — Z923 Personal history of irradiation: Secondary | ICD-10-CM | POA: Insufficient documentation

## 2022-05-22 DIAGNOSIS — Z79899 Other long term (current) drug therapy: Secondary | ICD-10-CM | POA: Diagnosis not present

## 2022-05-22 DIAGNOSIS — Z8052 Family history of malignant neoplasm of bladder: Secondary | ICD-10-CM | POA: Insufficient documentation

## 2022-05-22 DIAGNOSIS — Z9221 Personal history of antineoplastic chemotherapy: Secondary | ICD-10-CM | POA: Insufficient documentation

## 2022-05-22 DIAGNOSIS — Z510 Encounter for antineoplastic radiation therapy: Secondary | ICD-10-CM | POA: Diagnosis present

## 2022-05-22 DIAGNOSIS — Z808 Family history of malignant neoplasm of other organs or systems: Secondary | ICD-10-CM | POA: Diagnosis not present

## 2022-05-22 DIAGNOSIS — Z5181 Encounter for therapeutic drug level monitoring: Secondary | ICD-10-CM | POA: Diagnosis not present

## 2022-05-22 LAB — CBC WITH DIFFERENTIAL/PLATELET
Abs Immature Granulocytes: 0.02 10*3/uL (ref 0.00–0.07)
Basophils Absolute: 0 10*3/uL (ref 0.0–0.1)
Basophils Relative: 1 %
Eosinophils Absolute: 0.5 10*3/uL (ref 0.0–0.5)
Eosinophils Relative: 12 %
HCT: 33 % — ABNORMAL LOW (ref 36.0–46.0)
Hemoglobin: 10.7 g/dL — ABNORMAL LOW (ref 12.0–15.0)
Immature Granulocytes: 1 %
Lymphocytes Relative: 24 %
Lymphs Abs: 1 10*3/uL (ref 0.7–4.0)
MCH: 30.5 pg (ref 26.0–34.0)
MCHC: 32.4 g/dL (ref 30.0–36.0)
MCV: 94 fL (ref 80.0–100.0)
Monocytes Absolute: 0.4 10*3/uL (ref 0.1–1.0)
Monocytes Relative: 8 %
Neutro Abs: 2.4 10*3/uL (ref 1.7–7.7)
Neutrophils Relative %: 54 %
Platelets: 224 10*3/uL (ref 150–400)
RBC: 3.51 MIL/uL — ABNORMAL LOW (ref 3.87–5.11)
RDW: 15.2 % (ref 11.5–15.5)
WBC: 4.3 10*3/uL (ref 4.0–10.5)
nRBC: 0 % (ref 0.0–0.2)

## 2022-05-22 LAB — COMPREHENSIVE METABOLIC PANEL
ALT: 12 U/L (ref 0–44)
AST: 19 U/L (ref 15–41)
Albumin: 3.7 g/dL (ref 3.5–5.0)
Alkaline Phosphatase: 48 U/L (ref 38–126)
Anion gap: 7 (ref 5–15)
BUN: 13 mg/dL (ref 6–20)
CO2: 24 mmol/L (ref 22–32)
Calcium: 9 mg/dL (ref 8.9–10.3)
Chloride: 108 mmol/L (ref 98–111)
Creatinine, Ser: 1.23 mg/dL — ABNORMAL HIGH (ref 0.44–1.00)
GFR, Estimated: 51 mL/min — ABNORMAL LOW (ref >60)
Glucose, Bld: 98 mg/dL (ref 70–99)
Potassium: 4.2 mmol/L (ref 3.5–5.1)
Sodium: 139 mmol/L (ref 135–145)
Total Bilirubin: 0.7 mg/dL (ref 0.3–1.2)
Total Protein: 7.2 g/dL (ref 6.5–8.1)

## 2022-05-22 MED ORDER — ANASTROZOLE 1 MG PO TABS: 1.0000 mg | ORAL_TABLET | Freq: Every day | ORAL | 2 refills | Status: DC

## 2022-05-22 MED ORDER — ABEMACICLIB 100 MG PO TABS: 100.0000 mg | ORAL_TABLET | Freq: Two times a day (BID) | ORAL | 2 refills | Status: DC

## 2022-05-22 NOTE — Progress Notes (Signed)
Hematology/Oncology Consult note [facility]  [phone] [phone]  Patient Care Team: Sindy Guadeloupe, MD as PCP - General (Oncology) Sindy Guadeloupe, MD as Consulting Physician (Hematology and Oncology) Noreene Filbert, MD as Consulting Physician (Radiation Oncology) Herbert Pun, MD as Consulting Physician (General Surgery)   Name of the patient: Emma Cuevas  [id number]  [birth year]   Date of visit: 05/22/22  Diagnosis- clinical prognostic stage IIIa right breast cancer T3 N1 M0 ER/PR positive HER2 negative   Chief complaint/ Reason for visit-routine follow-up of breast cancer on Arimidex and Verzenio  Heme/Onc history: Patient is a 56 year old female who underwent a routine bilateral screening mammogram in September 2022 which showed calcifications in the right breast and prominent right axillary lymph node concerning for malignancy.  This was followed by diagnostic mammogram and ultrasound.  Mammogram showed extensive suspicious pleomorphic calcifications measuring up to 6.8 cm.  2 abnormal lymph nodes were seen in the right axilla on ultrasound as well.  Both the calcifications and the lymph node was biopsied.  Anterior and posterior end of the calcifications was consistent with invasive mammary carcinoma grade 3.  Lymph node was positive for metastatic carcinoma as well.  ER 91 to 100% positive PR 41 to 50% positive and HER2 negative.  Ki-67 40%   MRI bilateral breasts showed non-mass enhancement involving the upper outer lower outer and upper inner quadrants finding 10.9 x 8.4 x 6.8 cm.  Non-mass enhancement in the left breast spanning 1.6 x 1.3 x 0.8 cm.  6 metastatic right axillary lymph nodes.   CT chest abdomen and pelvis with contrast showed mild right axillary adenopathy compatible with metastatic disease but no evidence of distant metastatic disease.  5 mm left lower lobe nodule.  Bone scan was negative for metastatic  disease. Patient completed neoadjuvant chemotherapy with dose dense AC followed by Taxol.  She underwent right mastectomy with immediate reconstruction on 09/16/2021.     Final pathology showedMultiple foci of invasive mammary carcinoma the largest focus being 3 mm with extensive lymphovascular invasion.  Tumor invading this dermis of the skin without skin ulceration.  1 micrometastatic carcinoma in the lymph node and 1 out of 8 metastatic carcinoma in the nonsentinel lymph nodes.  Margins negative.  Arimidex started in September 2023.She is also receiving adjuvant Zometa.  Verzenio started on 02/02/2022.   Patient completed adjuvant radiation therapy.     Interval history-patient had an allergic reaction possibly to the glue that was used during her mastectomy and mastopexy procedure.  She developed a wound infection of the left breast mastopexy site requiring oral antibiotics.  She was off Verzenio for a while and restarted it about a week ago.  ECOG PS- 1 Pain scale- 2   Review of systems- Review of Systems  Constitutional:  Negative for chills, fever, malaise/fatigue and weight loss.  HENT:  Negative for congestion, ear discharge and nosebleeds.   Eyes:  Negative for blurred vision.  Respiratory:  Negative for cough, hemoptysis, sputum production, shortness of breath and wheezing.   Cardiovascular:  Negative for chest pain, palpitations, orthopnea and claudication.  Gastrointestinal:  Negative for abdominal pain, blood in stool, constipation, diarrhea, heartburn, melena, nausea and vomiting.  Genitourinary:  Negative for dysuria, flank pain, frequency, hematuria and urgency.  Musculoskeletal:  Negative for back pain, joint pain and myalgias.  Skin:  Negative for rash.  Neurological:  Negative for dizziness, tingling, focal weakness, seizures, weakness and headaches.  Endo/Heme/Allergies:  Does not  bruise/bleed easily.  Psychiatric/Behavioral:  Negative for depression and suicidal ideas.  The patient does not have insomnia.       Allergies  Allergen Reactions   Other Rash    DERMABOND SKIN GLUE- localized skin reaction; severe skin rash and blistering   Tegaderm Ag Mesh [Silver] Rash    Itchy/buring tiny red bump   Tape     Redness and burning     Past Medical History:  Diagnosis Date   Anemia    Benign neoplasm of breast 2013   left breast   BRCA negative 2015   Breast cancer ([place])    Cancer ([place])    Family history of bladder cancer    Family history of breast cancer    Family history of malignant neoplasm of breast 2013   Family history of pancreatic cancer    Screening for obesity      Past Surgical History:  Procedure Laterality Date   ABDOMINAL HYSTERECTOMY  2011   partial   BREAST BIOPSY Left 2013   BREAST BIOPSY Right 03/14/2021   Stereo bx-anterior calcs, "coil" clip-path pending   BREAST BIOPSY Right 03/14/2021   stereo bx-calcs, "Ribbon" clip-path pending   breast biopsy Right 03/14/2021   [place] Bx, Axilla, path pending   BREAST BIOPSY Left 04/30/2021   BREAST BIOPSY WITH RADIO FREQUENCY LOCALIZER Right 09/09/2021   RF tag in axilla   BREAST RECONSTRUCTION WITH PLACEMENT OF TISSUE EXPANDER AND FLEX HD (ACELLULAR HYDRATED DERMIS) Right 09/16/2021   Procedure: RIGHT BREAST RECONSTRUCTION WITH PLACEMENT OF TISSUE EXPANDER AND FLEX HD (ACELLULAR HYDRATED DERMIS);  Surgeon: Cindra Presume, MD;  Location: [facility];  Service: Plastics;  Laterality: Right;   BREAST REDUCTION SURGERY Left 05/01/2022   Procedure: MAMMARY REDUCTION  (BREAST);  Surgeon: [name], MD;  Location: [place];  Service: Plastics;  Laterality: Left;   BREAST SURGERY Left 10/16/2011   left breast finesse biopsy   COLONOSCOPY WITH PROPOFOL N/A 12/05/2014   Procedure: COLONOSCOPY WITH PROPOFOL;  Surgeon: Christene Lye, MD;  Location: [facility];  Service: Endoscopy;  Laterality: N/A;   DILATION AND CURETTAGE OF UTERUS     IR CV LINE  INJECTION  05/06/2021   IR IMAGING GUIDED PORT INSERTION  05/08/2021   MASTECTOMY Right    PORT-A-CATH REMOVAL  09/16/2021   Procedure: REMOVAL PORT-A-CATH;  Surgeon: Herbert Pun, MD;  Location: [facility];  Service: General;;   PORTACATH PLACEMENT N/A 04/02/2021   Procedure: INSERTION PORT-A-CATH;  Surgeon: Herbert Pun, MD;  Location: [facility];  Service: General;  Laterality: N/A;   REMOVAL OF TISSUE EXPANDER AND PLACEMENT OF IMPLANT Right 05/01/2022   Procedure: REMOVAL OF TISSUE EXPANDER AND PLACEMENT OF IMPLANT;  Surgeon: [name], MD;  Location: [place];  Service: Plastics;  Laterality: Right;   SIMPLE MASTECTOMY WITH AXILLARY SENTINEL NODE BIOPSY Right 09/16/2021   Procedure: SIMPLE MASTECTOMY WITH AXILLARY SENTINEL NODE BIOPSY -- RF tag in axillary;  Surgeon: Herbert Pun, MD;  Location: [facility];  Service: General;  Laterality: Right;    Social History   Socioeconomic History   Marital status: Married    Spouse name: Legrand Como   Number of children: Not on file   Years of education: Not on file   Highest education level: Not on file  Occupational History   Not on file  Tobacco Use   Smoking status: Never   Smokeless tobacco: Never  Vaping Use   Vaping Use: Never used  Substance and Sexual Activity   Alcohol use: No   Drug use: No   Sexual activity: Not on file  Other Topics Concern   Not on file  Social History Narrative   Married lives at home with husband   Social Determinants of Health   Financial Resource Strain: Not on file  Food Insecurity: Not on file  Transportation Needs: Not on file  Physical Activity: Not on file  Stress: Not on file  Social Connections: Not on file  Intimate Partner Violence: Not on file    Family History  Problem Relation Age of Onset   Breast cancer Mother 43   Bladder Cancer Father 44   Breast cancer Paternal Aunt 10   Pancreatic cancer Paternal Uncle 29   Pancreatic cancer  Maternal Grandfather 73   Cancer Cousin        in ear     Current Outpatient Medications:    abemaciclib (VERZENIO) 100 MG tablet, Take 1 tablet (100 mg total) by mouth 2 (two) times daily., Disp: 60 tablet, Rfl: 2   acetaminophen (TYLENOL) 500 MG tablet, Take 1,000 mg by mouth every 6 (six) hours as needed for moderate pain., Disp: , Rfl:    anastrozole (ARIMIDEX) 1 MG tablet, Take 1 tablet (1 mg total) by mouth daily., Disp: 90 tablet, Rfl: 2   Calcium Carb-Cholecalciferol 500-10 MG-MCG TABS, Take 1 tablet by mouth every morning., Disp: , Rfl:    clobetasol cream (TEMOVATE) 1.54 %, Apply 1 Application topically 2 (two) times daily for 7 days., Disp: 7 g, Rfl: 0   diphenoxylate-atropine (LOMOTIL) 2.5-0.025 MG tablet, Take 2 tablets by mouth 4 (four) times daily as needed for diarrhea or loose stools., Disp: 60 tablet, Rfl: 0   prochlorperazine (COMPAZINE) 10 MG tablet, Take 1 tablet (10 mg total) by mouth every 6 (six) hours as needed for nausea or vomiting., Disp: 30 tablet, Rfl: 2   sulfamethoxazole-trimethoprim (BACTRIM DS) 800-160 MG tablet, Take 1 tablet by mouth 2 (two) times daily for 6 days., Disp: 12 tablet, Rfl: 0  Physical exam:  Vitals:   05/22/22 0928  BP: 105/69  Pulse: 79  Resp: 14  Temp: 98.7 F (37.1 C)  Weight: 180 lb 12.8 oz (82 kg)  Height: 5' 5.5" (1.664 m)   Physical Exam Cardiovascular:     Rate and Rhythm: Normal rate and regular rhythm.     Heart sounds: Normal heart sounds.  Pulmonary:     Effort: Pulmonary effort is normal.     Breath sounds: Normal breath sounds.  Skin:    General: Skin is warm and dry.  Neurological:     Mental Status: She is alert and oriented to person, place, and time.   Breast/chest wall exam: Patient is s/p right mastectomy with reconstruction.  No evidence of chest wall recurrence.  Mastectomy wound is well-healed.  Patient is s/p left mastopexy.  There is a small open wound about dime sized at the inferior aspect of the  right breast.  There is some minimal discharge noted at that site.     Latest Ref Rng & Units 05/22/2022    9:05 AM  CMP  Glucose 70 - 99 mg/dL 98   BUN 6 - 20 mg/dL 13   Creatinine 0.44 - 1.00 mg/dL 1.23   Sodium 135 - 145 mmol/L 139   Potassium 3.5 - 5.1 mmol/L 4.2   Chloride 98 - 111 mmol/L 108   CO2 22 - 32 mmol/L 24   Calcium 8.9 -  10.3 mg/dL 9.0   Total Protein 6.5 - 8.1 g/dL 7.2   Total Bilirubin 0.3 - 1.2 mg/dL 0.7   Alkaline Phos 38 - 126 U/L 48   AST 15 - 41 U/L 19   ALT 0 - 44 U/L 12       Latest Ref Rng & Units 05/22/2022    9:05 AM  CBC  WBC 4.0 - 10.5 K/uL 4.3   Hemoglobin 12.0 - 15.0 g/dL 10.7   Hematocrit 36.0 - 46.0 % 33.0   Platelets 150 - 400 K/uL 224      Assessment and plan- Patient is a 56 y.o. 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  She is here for routine follow-up of breast cancer on Verzenio and Arimidex  Patient has started taking Verzenio about a week ago.  Serum creatinine is mildly elevated at 1.2 today as compared to 0.9 which is at her baseline.  I have asked her to improve her oral fluid intake.  This could be secondary to [employer].  Continue to monitor.  She is currently on first dose reduction of Verzenio at 100 mg twice daily.  If her renal functions continue to worsen I will have to consider decreasing the dose of Verzenio further.  Chronic normocytic anemia.  Overall at her baseline continue to monitor  Left breast mastopexy wound is healing well.  She is following up with plastic surgery for this  Will repeat labs in 6 weeks and 12 weeks and I will see her back in 12 weeks and she will receive Zometa on that day   Visit Diagnosis 1. 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 High risk medication use   3. Visit for monitoring Arimidex therapy      Dr. Randa Evens, MD, MPH [facility] at [facility] [phone] 05/22/2022 11:22 AM

## 2022-05-26 ENCOUNTER — Encounter: Payer: Self-pay | Admitting: Surgical

## 2022-05-26 ENCOUNTER — Ambulatory Visit (INDEPENDENT_AMBULATORY_CARE_PROVIDER_SITE_OTHER): Payer: Managed Care, Other (non HMO) | Admitting: Surgical

## 2022-05-26 ENCOUNTER — Encounter: Payer: Self-pay | Admitting: Oncology

## 2022-05-26 VITALS — BP 105/59 | HR 97

## 2022-05-26 DIAGNOSIS — C50911 Malignant neoplasm of unspecified site of right female breast: Secondary | ICD-10-CM

## 2022-05-26 DIAGNOSIS — Z9889 Other specified postprocedural states: Secondary | ICD-10-CM

## 2022-05-26 NOTE — Progress Notes (Signed)
Patient is a 57 year old female here for follow-up on her right breast reconstruction and left breast balancing reduction for symmetry.  She underwent placement of a 500 cc high-profile Mentor gel implant and left breast reduction with Dr. Lovena Le on 05/01/2022.  She is just shy of 4 weeks postop.  Postoperatively she had some issues with dermatitis and erythema to the left breast, possible retained seroma.  She was treated with Bactrim for 5 days at her last appointment on 05/19/2022  Today patient reports she is doing much better since starting the antibiotic, she does have a T-junction wound on the left breast.  She reports the right breast has not had any issues.  She finished the Bactrim yesterday.  She is not having any infectious symptoms, denies any increased pain to the left or right breast.  Chaperone present on exam On exam right breast incision is intact, implant is soft, no erythema or cellulitic changes noted.  There is no tenderness with palpation.  No subcutaneous fluid collection noted.  On exam of the left breast, NAC is viable, she does have a T-junction wound that is approximately 2 x 1.5 cm.  There is no surrounding cellulitic changes.  She does have some redness along the inframammary fold but it appears more irritated than infected at this point.  I do not appreciate any subcutaneous fluid collections with palpation.  She does have some firmness of the left breast but this seems consistent with postoperative swelling/fat necrosis.  I do not feel any areas of fluctuance.  A/P:  Patient has finished antibiotics, I do not see any signs of overt infection on exam today.  Recommend Vaseline and gauze to the left T-junction wound.  There is no signs of concern on the right side which has an implant in place.  She has good shape and symmetry at this time.  Recommend following up in approximately 1 week for reevaluation.  Recommend calling if she develops any new symptoms, increased  pain of left or right breast or any redness/infectious signs.  Patient was agreeable to this.  Pictures were taken and placed in her chart with her permission.

## 2022-06-03 ENCOUNTER — Encounter: Payer: Self-pay | Admitting: Physician Assistant

## 2022-06-03 ENCOUNTER — Telehealth: Payer: Self-pay

## 2022-06-03 ENCOUNTER — Telehealth: Payer: Self-pay | Admitting: *Deleted

## 2022-06-03 ENCOUNTER — Ambulatory Visit: Payer: Managed Care, Other (non HMO) | Admitting: Physician Assistant

## 2022-06-03 VITALS — BP 115/60 | HR 90

## 2022-06-03 DIAGNOSIS — Z9889 Other specified postprocedural states: Secondary | ICD-10-CM

## 2022-06-03 NOTE — Progress Notes (Signed)
Patient is a pleasant 57 year old female with PMH of right-sided breast cancer s/p reconstruction with unilateral implant placement 05/01/2022 along with left-sided reduction and mastopexy for symmetry performed 05/01/2022 by Dr. Lovena Le who presents to clinic for postoperative follow-up.  Reviewed chart and she experienced dermatitis and erythema to left breast postoperatively treated with Bactrim.  She was last seen here in clinic on 05/26/2022.  At that time, she was without any issues involving the right breast, but reported inferior T zone wound on the left side.  Wound was noted to be 2 x 1.5 cm.  Some firmness was appreciated consistent with postoperative inflammation versus fat necrosis, but no palpable fluid collections or fluctuance.  Discussed Vaseline and gauze to left T zone wound.  Return in 1 week.  Today, she is doing well with the exception of her postoperative wound.  She states that her dressings had been sticking previously, but for the past week has been putting a liberal amount of Vaseline on the wound before placing the gauze.  Changing daily.  Showering normally.  No other issues or complaints.  Breasts with good shape and symmetry.  Left NAC healthy and viable.  Unfortunately she has a 3.5 x 3 cm largely superficial wound at inferior T zone left breast.  She has an additional 1 x 1 cm wound at the NAC vertical limb junction and another 1 x 1 cm incisional wound just medial of the large inferior T zone wound.  There was a mild amount of slough around periphery of wound that was gently debrided with 4 x 4 gauze here in clinic.  Surrounding skin actually appears improved compared to previous images.  No significant erythema or induration.  Nontender.  On the right side, she did have some dryness on the most medial and lateral aspects of her mastectomy incision.  She also had a small, 0.25 cm suture abscess on the lateral aspect.  Superficial.  No purulence or redness.  Drained with gentle  gauze debridement.  Placing order with [facility] for Adaptic and KY jelly dressing changes twice daily followed by gauze and ABD pad.  Return in 2 weeks, sooner if needed.  Picture(s) obtained of the patient and placed in the chart were with the patient's or guardian's permission.

## 2022-06-03 NOTE — Telephone Encounter (Signed)
Received fax from [facility] regarding requested wound care supplies: "[facility] will be contacting the patient with pricing options. Their deductible has not yet been satisfied." I contacted patient and made aware. She conveyed understanding.

## 2022-06-03 NOTE — Telephone Encounter (Signed)
Faxed order,demographics,insurance information, and recent office notes to [facility] for supplies for the patient.     Supplies:ABD Pads-Twice daily                Gauze-(4x4)-Twice daily                 Adaptic-Twice daily                 KY Gel-Twice daily  Confirmation received and copy scanned into the chart.//AB/CMA

## 2022-06-04 ENCOUNTER — Telehealth: Payer: Self-pay

## 2022-06-04 ENCOUNTER — Encounter: Payer: Self-pay | Admitting: Oncology

## 2022-06-04 NOTE — Telephone Encounter (Signed)
Per [facility]: "[facility] has contacted the patient with pricing options. Their deductible has not yet been satisfied. The patient purchased the requested supplies."

## 2022-06-05 ENCOUNTER — Telehealth: Payer: Self-pay | Admitting: *Deleted

## 2022-06-05 NOTE — Telephone Encounter (Signed)
Called the  [facility]. I called to tell the staff that pt was off of them med fore several week because pt had surgery and now she should be set up for the verzenio. I was trf to another person and I was told that she is getting shipment of hte drug on next tues. And the pt is aware

## 2022-06-11 ENCOUNTER — Telehealth: Payer: Self-pay | Admitting: *Deleted

## 2022-06-11 NOTE — Telephone Encounter (Signed)
Faxed on (06/03/22) [facility] order for wound supplies for the patient.  Confirmation received and copy scanned into the chart.//AB/CMA

## 2022-06-16 NOTE — Progress Notes (Signed)
Patient is a pleasant 57 year old female with PMH of right-sided breast cancer s/p reconstruction with unilateral implant placement 05/01/2022 along with left-sided reduction and mastopexy for symmetry performed 05/01/2022 by Dr. Lovena Le who presents to clinic for postoperative follow-up.   She was last seen here in clinic on 06/03/2022.  At that time, breasts with good shape and symmetry.  Unfortunately, 3.5 x 3 cm largely superficial wound was noted at the inferior T zone left breast.  Additional 1 x 1 cm wound at the NAC vertical limb junction and another 1 x 1 cm incisional wound just medial to the large inferior T zone wound.  Slough from periphery of wound was gently debrided with 4 x 4 gauze.  No evidence concerning for infection.  Placed order with [facility] for Adaptic and K-Y jelly dressing changes twice daily followed by gauze and ABD pad.  Return in 2 weeks, sooner if needed.  Pictures were placed in chart.  Today, patient reports that the wound has improved.  She is frustrated with the chronicity of her wound healing.  She tells me that she has been applying Adaptic followed by K-Y jelly twice daily, as instructed.  The large T zone wound emerged with the smaller wound just medially and now she states that it is much larger.  She states that it has been kept clean and she denies any significant pain.  The wound at the top of her vertical limb has improved.  She also states that she has another incisional wound out laterally where she reports she had a lot of drainage earlier on her postoperative recovery.  No fevers.  On exam, she is well-appearing.  Unfortunately the inferior T zone wound is now 6 x 4 cm, approximately 0.25 cm depth.  No malodor.  No slough.  Clean.  Small 1 cm incisional wound laterally, nondraining.  Superior vertical limb wound appears healed compared to last encounter.  Breast is soft.  No surrounding cellulitic changes.  Discussed case with Dr. Lovena Le who personally evaluated  patient.  Given that she has been having some drainage, will transition her to silver calcium alginate dressings daily to see if that translates to improved wound healing.  Will call her on the phone tomorrow to see how she does with the dressing this evening.  Close follow-up in 1 week, Dr. Lovena Le will biopsy the skin edges if there is no improvement to look for underlying cause.  Picture(s) obtained of the patient and placed in the chart were with the patient's or guardian's permission.

## 2022-06-17 ENCOUNTER — Ambulatory Visit: Payer: Managed Care, Other (non HMO) | Admitting: Physician Assistant

## 2022-06-17 ENCOUNTER — Encounter: Payer: Self-pay | Admitting: Physician Assistant

## 2022-06-17 VITALS — BP 118/75 | HR 86

## 2022-06-17 DIAGNOSIS — Z9889 Other specified postprocedural states: Secondary | ICD-10-CM

## 2022-06-17 NOTE — Progress Notes (Signed)
Patient is a pleasant 57 year old female with PMH of right-sided breast cancer s/p reconstruction with unilateral implant placement 05/01/2022 along with left-sided reduction and mastopexy for symmetry performed 05/01/2022 by Dr. Lovena Le who presents to clinic for postoperative follow-up.   Unfortunately, patient's postoperative recovery has been complicated by a large inferior T zone wound on left breast at site of her reduction and mastopexy.  She was seen here in office on 06/17/2022 and wound was noted to be 6 x 4 cm, 0.25 cm depth.  Clean, no slough or malodor.  Plan is for transition to silver calcium alginate dressings daily to see if that improves wound healing.  Telephone visit scheduled for the following day to see how she did with the calcium alginate dressing changes or if she would prefer to stick with the current management.  She was also encouraged to follow-up in 1 week and if there is no significant progression in wound healing, Dr. Lovena Le is planning biopsy of the skin edges to evaluate for underlying cause.  The patient, Wednesday, gave permission to have this encounter performed via telemedicine, two identifiers were used to confirm patient's identity.  They also consented to chart review and treatment via this encounter.  The patient was at home and this provider was calling from their office.  A total of 10 minutes was spent speaking with the patient and reviewing chart.    Today, she tells me that the calcium alginate dressings have been well-tolerated.  She states that she does not use the Medipore tape because it irritates her skin, but she has been using calcium alginate followed by 4 x 4 gauze, ABD pad, and secured with compressive bra.  Plan for her to follow-up next week for reevaluation.  She understands that if there is no significant improvement, will require biopsy by Dr. Lovena Le.  Placing orders with [facility].

## 2022-06-18 ENCOUNTER — Telehealth (INDEPENDENT_AMBULATORY_CARE_PROVIDER_SITE_OTHER): Payer: Managed Care, Other (non HMO) | Admitting: Physician Assistant

## 2022-06-18 DIAGNOSIS — Z9889 Other specified postprocedural states: Secondary | ICD-10-CM

## 2022-06-19 ENCOUNTER — Telehealth: Payer: Self-pay

## 2022-06-19 NOTE — Telephone Encounter (Signed)
Faxed wound supply order to [facility] with demographics, insurance card and most recent ov note. Received fax success confirmation. Forwarded order to front desk for batch scanning.

## 2022-06-19 NOTE — Telephone Encounter (Signed)
Received correspondence from [facility] with [facility] stating that they would call the pt with pricing options due to the pt's deductible not being satisfied yet. Will forward to front desk for batch scanning.

## 2022-06-23 ENCOUNTER — Telehealth: Payer: Self-pay | Admitting: *Deleted

## 2022-06-23 NOTE — Telephone Encounter (Signed)
Call received from Ms. Pitkin. She states she noticed redness at the site where she has had a portacath in the past and also lower on her breast earlier today but it is better now. She also sent a message through [place] to [place]. Advised her that he has been in surgery today but should see her message later today and for her to notify us if anything changes and she has not heard back from him. States she has a wound that Dr. Lovena Le is aware of but denies new redness, tenderness, and/or drainage at this time.

## 2022-06-24 NOTE — Telephone Encounter (Signed)
I called the patient in regards to her MyChart message.  She states that 2 nights ago, she noticed a little bit of redness to her left superior chest.  She denied any bumps, itching or irritation with the redness.  She states that when she got up the next morning, that it was completely gone.  She reports that she has not had issues with the area since then.  She denies any fevers or chills.  I discussed that she should continue to monitor the area.  I discussed with her to call us back if she notices it again or if she has any concerns.  Patient expressed understanding.  I recommended the patient to continue wound care to her left breast wound as she has been prescribed.  Patient expressed understanding and states that she has been doing the recommended wound care.

## 2022-06-25 ENCOUNTER — Encounter: Payer: Self-pay | Admitting: Plastic Surgery

## 2022-06-25 ENCOUNTER — Ambulatory Visit: Payer: Managed Care, Other (non HMO) | Admitting: Plastic Surgery

## 2022-06-25 VITALS — BP 126/62 | HR 77

## 2022-06-25 DIAGNOSIS — N6032 Fibrosclerosis of left breast: Secondary | ICD-10-CM

## 2022-06-25 DIAGNOSIS — T8189XD Other complications of procedures, not elsewhere classified, subsequent encounter: Secondary | ICD-10-CM

## 2022-06-25 DIAGNOSIS — Z9889 Other specified postprocedural states: Secondary | ICD-10-CM

## 2022-06-25 MED ORDER — SULFAMETHOXAZOLE-TRIMETHOPRIM 800-160 MG PO TABS: 1.0000 | ORAL_TABLET | Freq: Two times a day (BID) | ORAL | 0 refills | Status: AC

## 2022-06-25 NOTE — Progress Notes (Signed)
Ms. Emma Cuevas returns today for follow-up after her left breast reduction in her exchange from a tissue expander to implant on the right side.  The wound has not healed on the left side but does not seem to have gotten any larger in size either.  I do not have an explanation for why she has had so much wound healing difficulty.  2 -2 mm punch biopsies were obtained today 1 from the medial aspect of the wound and 1 for the lateral aspect of the wound.  These will be sent for pathologic diagnosis to ensure there is no underlying pathology report responsible for her delayed wound healing.  She also has a small area now on the right breast which is drained.  There is no surrounding erythema and no evidence of an ongoing infection.  I will however start her on Bactrim for 7-day course.  Follow-up next week

## 2022-06-29 ENCOUNTER — Encounter: Payer: Self-pay | Admitting: Oncology

## 2022-06-29 ENCOUNTER — Telehealth: Payer: Self-pay | Admitting: *Deleted

## 2022-06-29 NOTE — Telephone Encounter (Signed)
Pt sent mychart msg this morning with c/o fever (102) yesterday, sore throat and drainage. Reports that last night she experienced really bad diarrhea. Fever this am 101  RN returned call to patient. Diarrhea now resolved as pt took lomotil. Fever still present. Pt very raspy in voice.   I offered the patient a virtual visit with smc, but advised pt to get  covid/rsv/flu testing. Pt stated that she would see if her pcp can see her today and will let us know the results. We discussed that she is on Verzenio which could contribute to the diarrhea. However, given the upper respiratory symptoms- she would need to get the URI testing mentioned above. Pt thanked me for returning her phone cal.

## 2022-06-30 ENCOUNTER — Encounter: Payer: Self-pay | Admitting: Plastic Surgery

## 2022-07-02 ENCOUNTER — Ambulatory Visit: Payer: Managed Care, Other (non HMO) | Admitting: Plastic Surgery

## 2022-07-02 ENCOUNTER — Encounter: Payer: Self-pay | Admitting: Plastic Surgery

## 2022-07-02 VITALS — BP 113/72 | HR 94 | Ht 65.5 in | Wt 178.2 lb

## 2022-07-02 DIAGNOSIS — Z9889 Other specified postprocedural states: Secondary | ICD-10-CM

## 2022-07-02 DIAGNOSIS — T8189XD Other complications of procedures, not elsewhere classified, subsequent encounter: Secondary | ICD-10-CM

## 2022-07-02 MED ORDER — GABAPENTIN 100 MG PO CAPS: 100.0000 mg | ORAL_CAPSULE | Freq: Three times a day (TID) | ORAL | 3 refills | Status: DC

## 2022-07-02 NOTE — Progress Notes (Signed)
Ms. Emma Cuevas returns today for evaluation of the left breast.  She still has  an open wound on the inferior aspect of the left breast however there is clear evidence of healing since her last visit.  There is no erythema around the breast and the drainage from the breast appears to be tissue fluid.  The center of the wound has granulation tissue and has been bleeding a small amount.  She does note increased pain in the incisions of the breast.  Biopsy results are still pending.  Will continue the current wound care regimen and she will follow-up with me in 1 week.  I have started her on a short course of gabapentin for the pain in the incisions.

## 2022-07-03 ENCOUNTER — Other Ambulatory Visit: Payer: Managed Care, Other (non HMO)

## 2022-07-07 ENCOUNTER — Ambulatory Visit: Payer: Managed Care, Other (non HMO) | Admitting: Radiation Oncology

## 2022-07-09 ENCOUNTER — Ambulatory Visit: Payer: Managed Care, Other (non HMO) | Admitting: Plastic Surgery

## 2022-07-09 ENCOUNTER — Encounter: Payer: Self-pay | Admitting: Plastic Surgery

## 2022-07-09 VITALS — BP 128/82 | HR 71 | Ht 65.5 in | Wt 178.0 lb

## 2022-07-09 DIAGNOSIS — Z9889 Other specified postprocedural states: Secondary | ICD-10-CM

## 2022-07-09 DIAGNOSIS — T8189XD Other complications of procedures, not elsewhere classified, subsequent encounter: Secondary | ICD-10-CM

## 2022-07-09 NOTE — Progress Notes (Signed)
Ms. Emma Cuevas returns today for evaluation of wound on the vertical incision of her left breast reduction.  Wound has finally started to close.  She notes less pain and less drainage.  We reviewed the results of her biopsies which were fibrosis and inflammatory tissue, no malignancy noted.  She will continue with her dressing changes.  We will extend her follow-up to 2 weeks.  She may of course return to see me if needed.

## 2022-07-10 ENCOUNTER — Inpatient Hospital Stay: Payer: Managed Care, Other (non HMO) | Attending: Nurse Practitioner

## 2022-07-10 DIAGNOSIS — Z510 Encounter for antineoplastic radiation therapy: Secondary | ICD-10-CM | POA: Diagnosis present

## 2022-07-10 DIAGNOSIS — Z170 Estrogen receptor positive status [ER+]: Secondary | ICD-10-CM | POA: Insufficient documentation

## 2022-07-10 DIAGNOSIS — C50011 Malignant neoplasm of nipple and areola, right female breast: Secondary | ICD-10-CM | POA: Insufficient documentation

## 2022-07-10 LAB — CBC WITH DIFFERENTIAL/PLATELET
Abs Immature Granulocytes: 0.02 10*3/uL (ref 0.00–0.07)
Basophils Absolute: 0 10*3/uL (ref 0.0–0.1)
Basophils Relative: 1 %
Eosinophils Absolute: 0.2 10*3/uL (ref 0.0–0.5)
Eosinophils Relative: 4 %
HCT: 30.3 % — ABNORMAL LOW (ref 36.0–46.0)
Hemoglobin: 9.7 g/dL — ABNORMAL LOW (ref 12.0–15.0)
Immature Granulocytes: 1 %
Lymphocytes Relative: 29 %
Lymphs Abs: 1.2 10*3/uL (ref 0.7–4.0)
MCH: 30.5 pg (ref 26.0–34.0)
MCHC: 32 g/dL (ref 30.0–36.0)
MCV: 95.3 fL (ref 80.0–100.0)
Monocytes Absolute: 0.5 10*3/uL (ref 0.1–1.0)
Monocytes Relative: 13 %
Neutro Abs: 2.2 10*3/uL (ref 1.7–7.7)
Neutrophils Relative %: 52 %
Platelets: 187 10*3/uL (ref 150–400)
RBC: 3.18 MIL/uL — ABNORMAL LOW (ref 3.87–5.11)
RDW: 14.2 % (ref 11.5–15.5)
WBC: 4.2 10*3/uL (ref 4.0–10.5)
nRBC: 0 % (ref 0.0–0.2)

## 2022-07-10 LAB — COMPREHENSIVE METABOLIC PANEL
ALT: 13 U/L (ref 0–44)
AST: 16 U/L (ref 15–41)
Albumin: 3.5 g/dL (ref 3.5–5.0)
Alkaline Phosphatase: 40 U/L (ref 38–126)
Anion gap: 7 (ref 5–15)
BUN: 22 mg/dL — ABNORMAL HIGH (ref 6–20)
CO2: 26 mmol/L (ref 22–32)
Calcium: 8.7 mg/dL — ABNORMAL LOW (ref 8.9–10.3)
Chloride: 103 mmol/L (ref 98–111)
Creatinine, Ser: 1.22 mg/dL — ABNORMAL HIGH (ref 0.44–1.00)
GFR, Estimated: 51 mL/min — ABNORMAL LOW (ref >60)
Glucose, Bld: 98 mg/dL (ref 70–99)
Potassium: 4.1 mmol/L (ref 3.5–5.1)
Sodium: 136 mmol/L (ref 135–145)
Total Bilirubin: 0.4 mg/dL (ref 0.3–1.2)
Total Protein: 6.4 g/dL — ABNORMAL LOW (ref 6.5–8.1)

## 2022-07-13 ENCOUNTER — Ambulatory Visit: Payer: Managed Care, Other (non HMO) | Admitting: Radiation Oncology

## 2022-07-27 ENCOUNTER — Encounter: Payer: Self-pay | Admitting: *Deleted

## 2022-07-27 ENCOUNTER — Ambulatory Visit (INDEPENDENT_AMBULATORY_CARE_PROVIDER_SITE_OTHER): Payer: Managed Care, Other (non HMO) | Admitting: Plastic Surgery

## 2022-07-27 ENCOUNTER — Encounter: Payer: Self-pay | Admitting: Oncology

## 2022-07-27 DIAGNOSIS — T8189XD Other complications of procedures, not elsewhere classified, subsequent encounter: Secondary | ICD-10-CM

## 2022-07-27 DIAGNOSIS — Z9889 Other specified postprocedural states: Secondary | ICD-10-CM

## 2022-07-27 NOTE — Progress Notes (Signed)
Emma Cuevas returns today for evaluation of her left breast.  She underwent a left breast reduction as a balancing procedure on December 8 and has had a nonhealing wound since that time.  We have tried multiple wound healing strategies and I biopsied the wound.  There is no evidence of ongoing infection and the wound biopsies only showed chronic inflammation consistent with a healing wound.  There has been some progress in healing of the wound since it was at its largest size.  She had some setback since I last saw her but again the wound appears to be healing though slowly.  She is going to speak to her oncologist today about stopping her Verzenio for 4 to 6 weeks to see if we can get her wound healed without the interference of this medication.  She is also going to focus her diet to include more protein vitamin a vitamin C and zinc.  She will return to see me in 7 to 14 days.  If there is not clear evidence of healing with these new strategies I will order an MRI of the breast to ensure there is no hidden fluid collection or other pathology that I am missing.

## 2022-07-28 NOTE — Telephone Encounter (Signed)
[place] to let the know her treatment was on hold.

## 2022-08-03 ENCOUNTER — Encounter: Payer: Self-pay | Admitting: Internal Medicine

## 2022-08-03 ENCOUNTER — Inpatient Hospital Stay: Payer: Managed Care, Other (non HMO) | Admitting: Internal Medicine

## 2022-08-03 ENCOUNTER — Inpatient Hospital Stay: Payer: Managed Care, Other (non HMO) | Attending: Nurse Practitioner

## 2022-08-03 ENCOUNTER — Inpatient Hospital Stay: Payer: Managed Care, Other (non HMO)

## 2022-08-03 VITALS — BP 125/65 | HR 82 | Temp 98.7°F | Resp 15 | Wt 182.0 lb

## 2022-08-03 DIAGNOSIS — Z808 Family history of malignant neoplasm of other organs or systems: Secondary | ICD-10-CM | POA: Diagnosis not present

## 2022-08-03 DIAGNOSIS — Z800 Family history of malignant neoplasm of digestive organs: Secondary | ICD-10-CM | POA: Diagnosis not present

## 2022-08-03 DIAGNOSIS — Z832 Family history of diseases of the blood and blood-forming organs and certain disorders involving the immune mechanism: Secondary | ICD-10-CM | POA: Diagnosis not present

## 2022-08-03 DIAGNOSIS — Z79899 Other long term (current) drug therapy: Secondary | ICD-10-CM | POA: Diagnosis not present

## 2022-08-03 DIAGNOSIS — R911 Solitary pulmonary nodule: Secondary | ICD-10-CM | POA: Insufficient documentation

## 2022-08-03 DIAGNOSIS — Z8052 Family history of malignant neoplasm of bladder: Secondary | ICD-10-CM | POA: Insufficient documentation

## 2022-08-03 DIAGNOSIS — Z9221 Personal history of antineoplastic chemotherapy: Secondary | ICD-10-CM | POA: Insufficient documentation

## 2022-08-03 DIAGNOSIS — Z803 Family history of malignant neoplasm of breast: Secondary | ICD-10-CM | POA: Diagnosis not present

## 2022-08-03 DIAGNOSIS — R590 Localized enlarged lymph nodes: Secondary | ICD-10-CM | POA: Diagnosis not present

## 2022-08-03 DIAGNOSIS — Z7983 Long term (current) use of bisphosphonates: Secondary | ICD-10-CM | POA: Diagnosis not present

## 2022-08-03 DIAGNOSIS — Z90710 Acquired absence of both cervix and uterus: Secondary | ICD-10-CM | POA: Insufficient documentation

## 2022-08-03 DIAGNOSIS — Z79811 Long term (current) use of aromatase inhibitors: Secondary | ICD-10-CM | POA: Insufficient documentation

## 2022-08-03 DIAGNOSIS — C50011 Malignant neoplasm of nipple and areola, right female breast: Secondary | ICD-10-CM | POA: Diagnosis not present

## 2022-08-03 DIAGNOSIS — C50911 Malignant neoplasm of unspecified site of right female breast: Secondary | ICD-10-CM

## 2022-08-03 DIAGNOSIS — Z923 Personal history of irradiation: Secondary | ICD-10-CM | POA: Insufficient documentation

## 2022-08-03 DIAGNOSIS — Z170 Estrogen receptor positive status [ER+]: Secondary | ICD-10-CM

## 2022-08-03 DIAGNOSIS — Z86018 Personal history of other benign neoplasm: Secondary | ICD-10-CM | POA: Diagnosis not present

## 2022-08-03 DIAGNOSIS — Z510 Encounter for antineoplastic radiation therapy: Secondary | ICD-10-CM | POA: Diagnosis present

## 2022-08-03 LAB — CBC WITH DIFFERENTIAL/PLATELET
Abs Immature Granulocytes: 0.02 10*3/uL (ref 0.00–0.07)
Basophils Absolute: 0 10*3/uL (ref 0.0–0.1)
Basophils Relative: 1 %
Eosinophils Absolute: 0.1 10*3/uL (ref 0.0–0.5)
Eosinophils Relative: 2 %
HCT: 31.2 % — ABNORMAL LOW (ref 36.0–46.0)
Hemoglobin: 9.9 g/dL — ABNORMAL LOW (ref 12.0–15.0)
Immature Granulocytes: 1 %
Lymphocytes Relative: 20 %
Lymphs Abs: 0.8 10*3/uL (ref 0.7–4.0)
MCH: 30.5 pg (ref 26.0–34.0)
MCHC: 31.7 g/dL (ref 30.0–36.0)
MCV: 96 fL (ref 80.0–100.0)
Monocytes Absolute: 0.4 10*3/uL (ref 0.1–1.0)
Monocytes Relative: 10 %
Neutro Abs: 2.4 10*3/uL (ref 1.7–7.7)
Neutrophils Relative %: 66 %
Platelets: 168 10*3/uL (ref 150–400)
RBC: 3.25 MIL/uL — ABNORMAL LOW (ref 3.87–5.11)
RDW: 16 % — ABNORMAL HIGH (ref 11.5–15.5)
WBC: 3.7 10*3/uL — ABNORMAL LOW (ref 4.0–10.5)
nRBC: 0 % (ref 0.0–0.2)

## 2022-08-03 LAB — COMPREHENSIVE METABOLIC PANEL
ALT: 12 U/L (ref 0–44)
AST: 20 U/L (ref 15–41)
Albumin: 4 g/dL (ref 3.5–5.0)
Alkaline Phosphatase: 47 U/L (ref 38–126)
Anion gap: 6 (ref 5–15)
BUN: 16 mg/dL (ref 6–20)
CO2: 27 mmol/L (ref 22–32)
Calcium: 9.2 mg/dL (ref 8.9–10.3)
Chloride: 105 mmol/L (ref 98–111)
Creatinine, Ser: 1.05 mg/dL — ABNORMAL HIGH (ref 0.44–1.00)
GFR, Estimated: >60 mL/min (ref >60)
Glucose, Bld: 121 mg/dL — ABNORMAL HIGH (ref 70–99)
Potassium: 4.2 mmol/L (ref 3.5–5.1)
Sodium: 138 mmol/L (ref 135–145)
Total Bilirubin: 0.4 mg/dL (ref 0.3–1.2)
Total Protein: 7.3 g/dL (ref 6.5–8.1)

## 2022-08-03 MED ORDER — ZOLEDRONIC ACID 4 MG/100ML IV SOLN
4.0000 mg | INTRAVENOUS | Status: DC
  Administered 2022-08-03: 4 mg via INTRAVENOUS
  Filled 2022-08-03: qty 100

## 2022-08-03 MED ORDER — SODIUM CHLORIDE 0.9 % IV SOLN
Freq: Once | INTRAVENOUS | Status: AC
  Filled 2022-08-03: qty 250

## 2022-08-03 NOTE — Progress Notes (Signed)
Concerns with left breast- healing troubles

## 2022-08-03 NOTE — Progress Notes (Signed)
Hematology/Oncology Consult note [facility]  Telephone:([phone] Fax:([phone]  Patient Care Team: Sindy Guadeloupe, MD as PCP - General (Oncology) Sindy Guadeloupe, MD as Consulting Physician (Hematology and Oncology) Noreene Filbert, MD as Consulting Physician (Radiation Oncology) Herbert Pun, MD as Consulting Physician (General Surgery)   Name of the patient: Emma Cuevas  [id number]  1966/05/24   Date of visit: 08/03/22  Diagnosis- clinical prognostic stage IIIa right breast cancer T3 N1 M0 ER/PR positive HER2 negative   Chief complaint/ Reason for visit-routine follow-up of breast cancer on Arimidex and Verzenio  Heme/Onc history: Patient is a 57 year old female who underwent a routine bilateral screening mammogram in September 2022 which showed calcifications in the right breast and prominent right axillary lymph node concerning for malignancy.  This was followed by diagnostic mammogram and ultrasound.  Mammogram showed extensive suspicious pleomorphic calcifications measuring up to 6.8 cm.  2 abnormal lymph nodes were seen in the right axilla on ultrasound as well.  Both the calcifications and the lymph node was biopsied.  Anterior and posterior end of the calcifications was consistent with invasive mammary carcinoma grade 3.  Lymph node was positive for metastatic carcinoma as well.  ER 91 to 100% positive PR 41 to 50% positive and HER2 negative.  Ki-67 40%   MRI bilateral breasts showed non-mass enhancement involving the upper outer lower outer and upper inner quadrants finding 10.9 x 8.4 x 6.8 cm.  Non-mass enhancement in the left breast spanning 1.6 x 1.3 x 0.8 cm.  6 metastatic right axillary lymph nodes.   CT chest abdomen and pelvis with contrast showed mild right axillary adenopathy compatible with metastatic disease but no evidence of distant metastatic disease.  5 mm left lower lobe nodule.  Bone scan was negative for metastatic  disease. Patient completed neoadjuvant chemotherapy with dose dense AC followed by Taxol.  She underwent right mastectomy with immediate reconstruction on 09/16/2021.     Final pathology showedMultiple foci of invasive mammary carcinoma the largest focus being 3 mm with extensive lymphovascular invasion.  Tumor invading this dermis of the skin without skin ulceration.  1 micrometastatic carcinoma in the lymph node and 1 out of 8 metastatic carcinoma in the nonsentinel lymph nodes.  Margins negative.  Arimidex started in September 2023.She is also receiving adjuvant Zometa.  Verzenio started on 02/02/2022.   Patient completed adjuvant radiation therapy.     Interval history- Patient was seen today for labs and Zometa. She is been experiencing issues with wound healing in her left breast after the breast reduction procedure.  Has been closely following with plastic surgery.  Verzenio is on hold for 1 week now.  She continues with her anastrozole and tolerating that well.  Continues taking her calcium vitamin D.  ECOG PS- 1 Pain scale- 2   Review of systems- Review of Systems  Constitutional:  Negative for chills, fever, malaise/fatigue and weight loss.  HENT:  Negative for congestion, ear discharge and nosebleeds.   Eyes:  Negative for blurred vision.  Respiratory:  Negative for cough, hemoptysis, sputum production, shortness of breath and wheezing.   Cardiovascular:  Negative for chest pain, palpitations, orthopnea and claudication.  Gastrointestinal:  Negative for abdominal pain, blood in stool, constipation, diarrhea, heartburn, melena, nausea and vomiting.  Genitourinary:  Negative for dysuria, flank pain, frequency, hematuria and urgency.  Musculoskeletal:  Negative for back pain, joint pain and myalgias.  Skin:  Negative for rash.  Neurological:  Negative for dizziness,  tingling, focal weakness, seizures, weakness and headaches.  Endo/Heme/Allergies:  Does not bruise/bleed easily.   Psychiatric/Behavioral:  Negative for depression and suicidal ideas. The patient does not have insomnia.       Allergies  Allergen Reactions   Other Rash    DERMABOND SKIN GLUE- localized skin reaction; severe skin rash and blistering   Tegaderm Ag Mesh [Silver] Rash    Itchy/buring tiny red bump   Tape     Redness and burning     Past Medical History:  Diagnosis Date   Anemia    Benign neoplasm of breast 2013   left breast   BRCA negative 2015   Breast cancer ([place])    Cancer ([place])    Family history of bladder cancer    Family history of breast cancer    Family history of malignant neoplasm of breast 2013   Family history of pancreatic cancer    Screening for obesity      Past Surgical History:  Procedure Laterality Date   ABDOMINAL HYSTERECTOMY  2011   partial   BREAST BIOPSY Left 2013   BREAST BIOPSY Right 03/14/2021   Stereo bx-anterior calcs, "coil" clip-path pending   BREAST BIOPSY Right 03/14/2021   stereo bx-calcs, "Ribbon" clip-path pending   breast biopsy Right 03/14/2021   [place] Bx, Axilla, path pending   BREAST BIOPSY Left 04/30/2021   BREAST BIOPSY WITH RADIO FREQUENCY LOCALIZER Right 09/09/2021   RF tag in axilla   BREAST RECONSTRUCTION WITH PLACEMENT OF TISSUE EXPANDER AND FLEX HD (ACELLULAR HYDRATED DERMIS) Right 09/16/2021   Procedure: RIGHT BREAST RECONSTRUCTION WITH PLACEMENT OF TISSUE EXPANDER AND FLEX HD (ACELLULAR HYDRATED DERMIS);  Surgeon: Cindra Presume, MD;  Location: [facility];  Service: Plastics;  Laterality: Right;   BREAST REDUCTION SURGERY Left 05/01/2022   Procedure: MAMMARY REDUCTION  (BREAST);  Surgeon: [name], MD;  Location: [place];  Service: Plastics;  Laterality: Left;   BREAST SURGERY Left 10/16/2011   left breast finesse biopsy   COLONOSCOPY WITH PROPOFOL N/A 12/05/2014   Procedure: COLONOSCOPY WITH PROPOFOL;  Surgeon: Christene Lye, MD;  Location: [facility];  Service: Endoscopy;   Laterality: N/A;   DILATION AND CURETTAGE OF UTERUS     IR CV LINE INJECTION  05/06/2021   IR IMAGING GUIDED PORT INSERTION  05/08/2021   MASTECTOMY Right    PORT-A-CATH REMOVAL  09/16/2021   Procedure: REMOVAL PORT-A-CATH;  Surgeon: Herbert Pun, MD;  Location: [facility];  Service: General;;   PORTACATH PLACEMENT N/A 04/02/2021   Procedure: INSERTION PORT-A-CATH;  Surgeon: Herbert Pun, MD;  Location: [facility];  Service: General;  Laterality: N/A;   REMOVAL OF TISSUE EXPANDER AND PLACEMENT OF IMPLANT Right 05/01/2022   Procedure: REMOVAL OF TISSUE EXPANDER AND PLACEMENT OF IMPLANT;  Surgeon: [name], MD;  Location: [place];  Service: Plastics;  Laterality: Right;   SIMPLE MASTECTOMY WITH AXILLARY SENTINEL NODE BIOPSY Right 09/16/2021   Procedure: SIMPLE MASTECTOMY WITH AXILLARY SENTINEL NODE BIOPSY -- RF tag in axillary;  Surgeon: Herbert Pun, MD;  Location: [facility];  Service: General;  Laterality: Right;    Social History   Socioeconomic History   Marital status: Married    Spouse name: Legrand Como   Number of children: Not on file   Years of education: Not on file   Highest education level: Not on file  Occupational History   Not on file  Tobacco Use   Smoking status: Never   Smokeless  tobacco: Never  Vaping Use   Vaping Use: Never used  Substance and Sexual Activity   Alcohol use: No   Drug use: No   Sexual activity: Not on file  Other Topics Concern   Not on file  Social History Narrative   Married lives at home with husband   Social Determinants of Health   Financial Resource Strain: Not on file  Food Insecurity: Not on file  Transportation Needs: Not on file  Physical Activity: Not on file  Stress: Not on file  Social Connections: Not on file  Intimate Partner Violence: Not on file    Family History  Problem Relation Age of Onset   Breast cancer Mother 54   Bladder Cancer Father 49   Breast cancer Paternal  Aunt 72   Pancreatic cancer Paternal Uncle 16   Pancreatic cancer Maternal Grandfather 73   Cancer Cousin        in ear     Current Outpatient Medications:    abemaciclib (VERZENIO) 100 MG tablet, Take 1 tablet (100 mg total) by mouth 2 (two) times daily., Disp: 60 tablet, Rfl: 2   acetaminophen (TYLENOL) 500 MG tablet, Take 1,000 mg by mouth every 6 (six) hours as needed for moderate pain., Disp: , Rfl:    anastrozole (ARIMIDEX) 1 MG tablet, Take 1 tablet (1 mg total) by mouth daily., Disp: 90 tablet, Rfl: 2   Calcium Carb-Cholecalciferol 500-10 MG-MCG TABS, Take 1 tablet by mouth every morning., Disp: , Rfl:    diphenoxylate-atropine (LOMOTIL) 2.5-0.025 MG tablet, Take 2 tablets by mouth 4 (four) times daily as needed for diarrhea or loose stools., Disp: 60 tablet, Rfl: 0   gabapentin (NEURONTIN) 100 MG capsule, Take 1 capsule (100 mg total) by mouth 3 (three) times daily., Disp: 90 capsule, Rfl: 3   prochlorperazine (COMPAZINE) 10 MG tablet, Take 1 tablet (10 mg total) by mouth every 6 (six) hours as needed for nausea or vomiting. (Patient not taking: Reported on 08/03/2022), Disp: 30 tablet, Rfl: 2 No current facility-administered medications for this visit.  Facility-Administered Medications Ordered in Other Visits:    Zoledronic Acid (ZOMETA) IVPB 4 mg, 4 mg, Intravenous, Q6 months, Sindy Guadeloupe, MD, Last Rate: 400 mL/hr at 08/03/22 1415, 4 mg at 08/03/22 1415  Physical exam:  Vitals:   08/03/22 1331  BP: 125/65  Pulse: 82  Resp: 15  Temp: 98.7 F (37.1 C)  TempSrc: Tympanic  SpO2: 100%  Weight: 182 lb (82.6 kg)   Physical Exam Cardiovascular:     Rate and Rhythm: Normal rate and regular rhythm.     Heart sounds: Normal heart sounds.  Pulmonary:     Effort: Pulmonary effort is normal.     Breath sounds: Normal breath sounds.  Skin:    General: Skin is warm and dry.  Neurological:     Mental Status: She is alert and oriented to person, place, and time.    Breast/chest wall exam: Patient is s/p right mastectomy with reconstruction.  No evidence of chest wall recurrence.  Mastectomy wound is well-healed.  Patient is s/p left mastopexy.  There is a small open wound about dime sized at the inferior aspect of the right breast.  There is some minimal discharge noted at that site.     Latest Ref Rng & Units 08/03/2022    1:13 PM  CMP  Glucose 70 - 99 mg/dL 121   BUN 6 - 20 mg/dL 16   Creatinine 0.44 - 1.00 mg/dL 1.05  Sodium 135 - 145 mmol/L 138   Potassium 3.5 - 5.1 mmol/L 4.2   Chloride 98 - 111 mmol/L 105   CO2 22 - 32 mmol/L 27   Calcium 8.9 - 10.3 mg/dL 9.2   Total Protein 6.5 - 8.1 g/dL 7.3   Total Bilirubin 0.3 - 1.2 mg/dL 0.4   Alkaline Phos 38 - 126 U/L 47   AST 15 - 41 U/L 20   ALT 0 - 44 U/L 12       Latest Ref Rng & Units 08/03/2022    1:13 PM  CBC  WBC 4.0 - 10.5 K/uL 3.7   Hemoglobin 12.0 - 15.0 g/dL 9.9   Hematocrit 36.0 - 46.0 % 31.2   Platelets 150 - 400 K/uL 168      Assessment and plan- Patient is a 56 y.o. 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  She is here for routine follow-up of breast cancer on Verzenio and Arimidex  Verzenio is on hold since 1 week now due to concerns for wound healing in her left breast from breast reduction procedure.  Closely following with plastic surgery.  She continues with Anastrozole and tolerating well.  CBC with differential was reviewed today.  She has mild anemia which could be from [employer].  CMP was reviewed creatinine and calcium levels are within normal.  Will proceed with Zometa IV 4 mg today.  Confirmed patient is taking calcium vitamin D supplements.  Patient is already scheduled to follow-up with Dr. Janese Banks on March 26 to discuss about reinitiation of Verzenio and to check on wound healing.     Visit Diagnosis 1. Carcinoma of right breast, estrogen and progesterone receptor positive ([place])   2.  Long term use of bisphosphonates

## 2022-08-10 ENCOUNTER — Ambulatory Visit: Payer: Managed Care, Other (non HMO) | Admitting: Plastic Surgery

## 2022-08-10 ENCOUNTER — Encounter: Payer: Self-pay | Admitting: Plastic Surgery

## 2022-08-10 VITALS — BP 118/83 | HR 85 | Ht 65.5 in | Wt 180.0 lb

## 2022-08-10 DIAGNOSIS — T8189XD Other complications of procedures, not elsewhere classified, subsequent encounter: Secondary | ICD-10-CM

## 2022-08-10 DIAGNOSIS — T8131XD Disruption of external operation (surgical) wound, not elsewhere classified, subsequent encounter: Secondary | ICD-10-CM | POA: Diagnosis not present

## 2022-08-10 NOTE — Progress Notes (Signed)
Emma Cuevas returns today for evaluation of her left breast wound.  She discontinued her verzenio with her primary care physician's concurrence approximately 2 weeks ago.  Since that time she has had noticeable decrease in the size of the wound and has a noticeable increase in the amount of granulation tissue in the base of the wound.  She will continue to hold the medication for the next 2 to 4 weeks while we try to get the wound to completely heal.  She will continue to use nonadherent dressings during that period of time.  Follow-up with me in 4 weeks sooner if needed.

## 2022-08-18 ENCOUNTER — Other Ambulatory Visit: Payer: Managed Care, Other (non HMO)

## 2022-08-18 ENCOUNTER — Ambulatory Visit: Payer: Managed Care, Other (non HMO) | Admitting: Oncology

## 2022-08-18 ENCOUNTER — Inpatient Hospital Stay: Payer: Managed Care, Other (non HMO) | Admitting: Oncology

## 2022-08-18 ENCOUNTER — Ambulatory Visit: Payer: Managed Care, Other (non HMO)

## 2022-08-18 ENCOUNTER — Encounter: Payer: Self-pay | Admitting: Oncology

## 2022-08-18 VITALS — BP 117/79 | HR 79 | Temp 96.0°F | Resp 16 | Ht 65.0 in | Wt 184.4 lb

## 2022-08-18 DIAGNOSIS — C50011 Malignant neoplasm of nipple and areola, right female breast: Secondary | ICD-10-CM | POA: Diagnosis not present

## 2022-08-18 DIAGNOSIS — Z5181 Encounter for therapeutic drug level monitoring: Secondary | ICD-10-CM | POA: Diagnosis not present

## 2022-08-18 DIAGNOSIS — Z79811 Long term (current) use of aromatase inhibitors: Secondary | ICD-10-CM | POA: Diagnosis not present

## 2022-08-18 NOTE — Progress Notes (Signed)
Hematology/Oncology Consult note [facility]  Telephone:([phone] [phone]  Patient Care Team: Sindy Guadeloupe, MD as PCP - General (Oncology) Sindy Guadeloupe, MD as Consulting Physician (Hematology and Oncology) Noreene Filbert, MD as Consulting Physician (Radiation Oncology) Herbert Pun, MD as Consulting Physician (General Surgery)   Name of the patient: Emma Cuevas  [id number]  Dec 08, 1965   Date of visit: 08/18/22  Diagnosis- clinical prognostic stage IIIa right breast cancer T3 N1 M0 ER/PR positive HER2 negative   Chief complaint/ Reason for visit-routine follow-up of breast cancer on Arimidex  Heme/Onc history: Patient is a 57 year old female who underwent a routine bilateral screening mammogram in September 2022 which showed calcifications in the right breast and prominent right axillary lymph node concerning for malignancy.  This was followed by diagnostic mammogram and ultrasound.  Mammogram showed extensive suspicious pleomorphic calcifications measuring up to 6.8 cm.  2 abnormal lymph nodes were seen in the right axilla on ultrasound as well.  Both the calcifications and the lymph node was biopsied.  Anterior and posterior end of the calcifications was consistent with invasive mammary carcinoma grade 3.  Lymph node was positive for metastatic carcinoma as well.  ER 91 to 100% positive PR 41 to 50% positive and HER2 negative.  Ki-67 40%   MRI bilateral breasts showed non-mass enhancement involving the upper outer lower outer and upper inner quadrants finding 10.9 x 8.4 x 6.8 cm.  Non-mass enhancement in the left breast spanning 1.6 x 1.3 x 0.8 cm.  6 metastatic right axillary lymph nodes.   CT chest abdomen and pelvis with contrast showed mild right axillary adenopathy compatible with metastatic disease but no evidence of distant metastatic disease.  5 mm left lower lobe nodule.  Bone scan was negative for metastatic  disease. Patient completed neoadjuvant chemotherapy with dose dense AC followed by Taxol.  She underwent right mastectomy with immediate reconstruction on 09/16/2021.     Final pathology showedMultiple foci of invasive mammary carcinoma the largest focus being 3 mm with extensive lymphovascular invasion.  Tumor invading this dermis of the skin without skin ulceration.  1 micrometastatic carcinoma in the lymph node and 1 out of 8 metastatic carcinoma in the nonsentinel lymph nodes.  Margins negative.  Arimidex started in September 2023.She is also receiving adjuvant Zometa.  Verzenio started on 02/02/2022.  However patient had issues with left breast mastopexy wound healing and therefore Verzenio was discontinued in January 2024.   Patient completed adjuvant radiation therapy.        Interval history-left breast wound has still not healed completely but is overall much better as compared to before.  She is only on Arimidex at this time and has stopped Verzenio altogether.  ECOG PS- 0 Pain scale- 2   Review of systems- Review of Systems  Constitutional:  Negative for chills, fever, malaise/fatigue and weight loss.  HENT:  Negative for congestion, ear discharge and nosebleeds.   Eyes:  Negative for blurred vision.  Respiratory:  Negative for cough, hemoptysis, sputum production, shortness of breath and wheezing.   Cardiovascular:  Negative for chest pain, palpitations, orthopnea and claudication.  Gastrointestinal:  Negative for abdominal pain, blood in stool, constipation, diarrhea, heartburn, melena, nausea and vomiting.  Genitourinary:  Negative for dysuria, flank pain, frequency, hematuria and urgency.  Musculoskeletal:  Negative for back pain, joint pain and myalgias.  Skin:  Negative for rash.  Neurological:  Negative for dizziness, tingling, focal weakness, seizures, weakness and headaches.  Endo/Heme/Allergies:  Does not bruise/bleed easily.  Psychiatric/Behavioral:  Negative for  depression and suicidal ideas. The patient does not have insomnia.       Allergies  Allergen Reactions   Other Rash    DERMABOND SKIN GLUE- localized skin reaction; severe skin rash and blistering   Tegaderm Ag Mesh [Silver] Rash    Itchy/buring tiny red bump   Tape     Redness and burning     Past Medical History:  Diagnosis Date   Anemia    Benign neoplasm of breast 2013   left breast   BRCA negative 2015   Breast cancer ([place])    Cancer ([place])    Family history of bladder cancer    Family history of breast cancer    Family history of malignant neoplasm of breast 2013   Family history of pancreatic cancer    Screening for obesity      Past Surgical History:  Procedure Laterality Date   ABDOMINAL HYSTERECTOMY  2011   partial   BREAST BIOPSY Left 2013   BREAST BIOPSY Right 03/14/2021   Stereo bx-anterior calcs, "coil" clip-path pending   BREAST BIOPSY Right 03/14/2021   stereo bx-calcs, "Ribbon" clip-path pending   breast biopsy Right 03/14/2021   [place] Bx, Axilla, path pending   BREAST BIOPSY Left 04/30/2021   BREAST BIOPSY WITH RADIO FREQUENCY LOCALIZER Right 09/09/2021   RF tag in axilla   BREAST RECONSTRUCTION WITH PLACEMENT OF TISSUE EXPANDER AND FLEX HD (ACELLULAR HYDRATED DERMIS) Right 09/16/2021   Procedure: RIGHT BREAST RECONSTRUCTION WITH PLACEMENT OF TISSUE EXPANDER AND FLEX HD (ACELLULAR HYDRATED DERMIS);  Surgeon: Cindra Presume, MD;  Location: [facility];  Service: Plastics;  Laterality: Right;   BREAST REDUCTION SURGERY Left 05/01/2022   Procedure: MAMMARY REDUCTION  (BREAST);  Surgeon: [name], MD;  Location: [place];  Service: Plastics;  Laterality: Left;   BREAST SURGERY Left 10/16/2011   left breast finesse biopsy   COLONOSCOPY WITH PROPOFOL N/A 12/05/2014   Procedure: COLONOSCOPY WITH PROPOFOL;  Surgeon: Christene Lye, MD;  Location: [facility];  Service: Endoscopy;  Laterality: N/A;   DILATION AND CURETTAGE OF  UTERUS     IR CV LINE INJECTION  05/06/2021   IR IMAGING GUIDED PORT INSERTION  05/08/2021   MASTECTOMY Right    PORT-A-CATH REMOVAL  09/16/2021   Procedure: REMOVAL PORT-A-CATH;  Surgeon: Herbert Pun, MD;  Location: [facility];  Service: General;;   PORTACATH PLACEMENT N/A 04/02/2021   Procedure: INSERTION PORT-A-CATH;  Surgeon: Herbert Pun, MD;  Location: [facility];  Service: General;  Laterality: N/A;   REMOVAL OF TISSUE EXPANDER AND PLACEMENT OF IMPLANT Right 05/01/2022   Procedure: REMOVAL OF TISSUE EXPANDER AND PLACEMENT OF IMPLANT;  Surgeon: [name], MD;  Location: [place];  Service: Plastics;  Laterality: Right;   SIMPLE MASTECTOMY WITH AXILLARY SENTINEL NODE BIOPSY Right 09/16/2021   Procedure: SIMPLE MASTECTOMY WITH AXILLARY SENTINEL NODE BIOPSY -- RF tag in axillary;  Surgeon: Herbert Pun, MD;  Location: [facility];  Service: General;  Laterality: Right;    Social History   Socioeconomic History   Marital status: Married    Spouse name: Legrand Como   Number of children: Not on file   Years of education: Not on file   Highest education level: Not on file  Occupational History   Not on file  Tobacco Use   Smoking status: Never   Smokeless tobacco: Never  Vaping Use   Vaping Use: Never  used  Substance and Sexual Activity   Alcohol use: No   Drug use: No   Sexual activity: Not on file  Other Topics Concern   Not on file  Social History Narrative   Married lives at home with husband   Social Determinants of Health   Financial Resource Strain: Not on file  Food Insecurity: Not on file  Transportation Needs: Not on file  Physical Activity: Not on file  Stress: Not on file  Social Connections: Not on file  Intimate Partner Violence: Not on file    Family History  Problem Relation Age of Onset   Breast cancer Mother 80   Bladder Cancer Father 18   Breast cancer Paternal Aunt 43   Pancreatic cancer Paternal Uncle  49   Pancreatic cancer Maternal Grandfather 73   Cancer Cousin        in ear     Current Outpatient Medications:    acetaminophen (TYLENOL) 500 MG tablet, Take 1,000 mg by mouth every 6 (six) hours as needed for moderate pain., Disp: , Rfl:    anastrozole (ARIMIDEX) 1 MG tablet, Take 1 tablet (1 mg total) by mouth daily., Disp: 90 tablet, Rfl: 2   Calcium Carb-Cholecalciferol 500-10 MG-MCG TABS, Take 1 tablet by mouth every morning., Disp: , Rfl:    diphenoxylate-atropine (LOMOTIL) 2.5-0.025 MG tablet, Take 2 tablets by mouth 4 (four) times daily as needed for diarrhea or loose stools., Disp: 60 tablet, Rfl: 0   prochlorperazine (COMPAZINE) 10 MG tablet, Take 1 tablet (10 mg total) by mouth every 6 (six) hours as needed for nausea or vomiting., Disp: 30 tablet, Rfl: 2  Physical exam:  Vitals:   08/18/22 0911  BP: 117/79  Pulse: 79  Resp: 16  Temp: (!) 96 F (35.6 C)  TempSrc: Tympanic  SpO2: 99%  Weight: 184 lb 6.4 oz (83.6 kg)  Height: 5\' 5"  (1.651 m)   Physical Exam Cardiovascular:     Rate and Rhythm: Normal rate and regular rhythm.     Heart sounds: Normal heart sounds.  Pulmonary:     Effort: Pulmonary effort is normal.     Breath sounds: Normal breath sounds.  Abdominal:     General: Bowel sounds are normal.     Palpations: Abdomen is soft.  Skin:    General: Skin is warm and dry.  Neurological:     Mental Status: She is alert and oriented to person, place, and time.   Breast/chest wall exam: Patient is s/p right mastectomy with reconstruction.  No evidence of postmastectomy recurrence.  Implant edges are intact.  No palpable right axillary adenopathy.  No palpable masses in the left breast.  There is a superficial skin thickness ulceration noted in the inframammary area.  No palpable left axillary adenopathy     Latest Ref Rng & Units 08/03/2022    1:13 PM  CMP  Glucose 70 - 99 mg/dL 121   BUN 6 - 20 mg/dL 16   Creatinine 0.44 - 1.00 mg/dL 1.05   Sodium 135 -  145 mmol/L 138   Potassium 3.5 - 5.1 mmol/L 4.2   Chloride 98 - 111 mmol/L 105   CO2 22 - 32 mmol/L 27   Calcium 8.9 - 10.3 mg/dL 9.2   Total Protein 6.5 - 8.1 g/dL 7.3   Total Bilirubin 0.3 - 1.2 mg/dL 0.4   Alkaline Phos 38 - 126 U/L 47   AST 15 - 41 U/L 20   ALT 0 - 44 U/L  12       Latest Ref Rng & Units 08/03/2022    1:13 PM  CBC  WBC 4.0 - 10.5 K/uL 3.7   Hemoglobin 12.0 - 15.0 g/dL 9.9   Hematocrit 36.0 - 46.0 % 31.2   Platelets 150 - 400 K/uL 168     Assessment and plan- Patient is a 57 y.o. 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  She is here for routine follow-up of breast cancer on Arimidex  Patient was ideally supposed to take Verzenio for 2 years adjuvantly.  However given her wound healing issues it has been on hold since January 2024.  She has another 2 months of wound healing at least before we can say that it will be healed completely.  I therefore do not want her to restart Verzenio at this time.  She will only continue Arimidex along with calcium and vitamin D.  Patient has been receiving adjuvant Zometa every 6 months and she received her dose 2 weeks ago.  I will see her sometime in mid September with labs and she will receive Zometa on that day   Visit Diagnosis 1. 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 Visit for monitoring Arimidex therapy      Dr. Randa Evens, MD, MPH [facility] [id number] 08/18/2022 12:45 PM

## 2022-08-18 NOTE — Progress Notes (Signed)
No concerns for the provider. 

## 2022-08-28 ENCOUNTER — Ambulatory Visit: Payer: Managed Care, Other (non HMO) | Admitting: Oncology

## 2022-09-01 ENCOUNTER — Telehealth: Payer: Self-pay

## 2022-09-01 ENCOUNTER — Other Ambulatory Visit (HOSPITAL_COMMUNITY): Payer: Self-pay

## 2022-09-01 NOTE — Telephone Encounter (Signed)
Oral Oncology Patient Advocate Encounter  Re-authorization   Received notification that prior authorization for Verzenio is due for renewal.   PA submitted on 09/01/22  Key [id number]  Status is pending     Ardeen Fillers, CPhT Oncology Pharmacy Patient Advocate  [facility]  [phone] (phone) [phone] (fax) 09/01/2022 8:48 AM

## 2022-09-01 NOTE — Telephone Encounter (Signed)
Oral Oncology Patient Advocate Encounter  Prior Authorization for Kathlen Mody has been approved.    PA# [id number]  Effective dates: 09/01/22 through 09/01/23  Patient may continue to fill through Specialty Pharmacy.    Ardeen Fillers, CPhT Oncology Pharmacy Patient Advocate  [facility]  [phone] (phone) [phone] (fax) 09/01/2022 9:45 AM

## 2022-09-09 ENCOUNTER — Ambulatory Visit: Payer: Managed Care, Other (non HMO) | Admitting: Plastic Surgery

## 2022-09-09 DIAGNOSIS — T8189XD Other complications of procedures, not elsewhere classified, subsequent encounter: Secondary | ICD-10-CM

## 2022-09-09 DIAGNOSIS — T8131XD Disruption of external operation (surgical) wound, not elsewhere classified, subsequent encounter: Secondary | ICD-10-CM

## 2022-09-09 NOTE — Progress Notes (Signed)
[name] returns today for follow-up of her left breast wounds.  Since she has been off her Biologics she has had slow but continued healing of her wounds.  She still has some drainage but this seems to be from the area where the epidermis is still not covered the granulation tissue.  There is no evidence of wound infection.  He is feeling well.   Photographs were obtained today.  No change in her management will continue with dressing changes.  Follow-up with me in 1 month sooner if needed.

## 2022-10-14 ENCOUNTER — Encounter: Payer: Self-pay | Admitting: Oncology

## 2022-10-14 ENCOUNTER — Encounter: Payer: Self-pay | Admitting: Plastic Surgery

## 2022-10-14 ENCOUNTER — Ambulatory Visit: Payer: Managed Care, Other (non HMO) | Admitting: Plastic Surgery

## 2022-10-14 VITALS — BP 131/80 | HR 81 | Ht 65.5 in | Wt 180.0 lb

## 2022-10-14 DIAGNOSIS — S21002A Unspecified open wound of left breast, initial encounter: Secondary | ICD-10-CM

## 2022-10-14 DIAGNOSIS — T8189XD Other complications of procedures, not elsewhere classified, subsequent encounter: Secondary | ICD-10-CM

## 2022-10-14 DIAGNOSIS — Z9889 Other specified postprocedural states: Secondary | ICD-10-CM

## 2022-10-14 DIAGNOSIS — C50911 Malignant neoplasm of unspecified site of right female breast: Secondary | ICD-10-CM

## 2022-10-14 NOTE — Progress Notes (Signed)
Ms. Emma Cuevas returns today approximately 6 months postop from a left breast lift/balancing procedure in 1 month after her last office visit.  She has had a superficial nonhealing wound on her left breast almost since the time that her surgery was performed.  I biopsied the wound with no pathologic diagnosis.  She has stopped her Biologics with initial improvement in wound healing.  Today she relates that the wound was almost completely healed and then started to open up again.  Vertical incision at the areola is now open with approximately 1 cm x 1 cm superficial wound and at the inframammary incision she now has a superficial wound which is approximately 6 cm in length.  I have no explanation for why she has not healed this wound and I have now exhausted all wound healing options that I am aware of.  Will request a wound care consult, will obtain an MRI of the breast, and will asked Dr. Drake Leach him to evaluate the patient for her opinion.  Ms. Emma Cuevas understands and agrees to this plan.  She will return to see me as soon as the MRI is done.  Will schedule her follow-up on a day that Dr. Drake Leach him is in clinic so that she can see both of [place] in 1 day.

## 2022-10-15 ENCOUNTER — Encounter: Payer: Self-pay | Admitting: Plastic Surgery

## 2022-10-20 NOTE — Telephone Encounter (Signed)
Pt called back and said she contacted [facility] and they said order for MRI was incorrect and they needed it changed.  She did not know what it  needed to be changed to. Please advise.

## 2022-10-20 NOTE — Telephone Encounter (Signed)
Can you find me a phone number/person to talk to so I correct the order? Thank you

## 2022-10-20 NOTE — Addendum Note (Signed)
Addended by: Weyman Croon on: 10/20/2022 11:05 AM   Modules accepted: Orders

## 2022-10-21 ENCOUNTER — Encounter: Payer: Managed Care, Other (non HMO) | Attending: Physician Assistant | Admitting: Internal Medicine

## 2022-10-21 DIAGNOSIS — T8131XA Disruption of external operation (surgical) wound, not elsewhere classified, initial encounter: Secondary | ICD-10-CM | POA: Diagnosis not present

## 2022-10-21 DIAGNOSIS — Y834 Other reconstructive surgery as the cause of abnormal reaction of the patient, or of later complication, without mention of misadventure at the time of the procedure: Secondary | ICD-10-CM | POA: Diagnosis not present

## 2022-10-21 DIAGNOSIS — C50911 Malignant neoplasm of unspecified site of right female breast: Secondary | ICD-10-CM | POA: Diagnosis not present

## 2022-10-21 DIAGNOSIS — Z923 Personal history of irradiation: Secondary | ICD-10-CM | POA: Insufficient documentation

## 2022-10-21 DIAGNOSIS — S21002A Unspecified open wound of left breast, initial encounter: Secondary | ICD-10-CM | POA: Diagnosis not present

## 2022-10-21 DIAGNOSIS — Z9221 Personal history of antineoplastic chemotherapy: Secondary | ICD-10-CM | POA: Insufficient documentation

## 2022-10-21 DIAGNOSIS — L98491 Non-pressure chronic ulcer of skin of other sites limited to breakdown of skin: Secondary | ICD-10-CM | POA: Diagnosis not present

## 2022-10-22 ENCOUNTER — Ambulatory Visit
Admission: RE | Admit: 2022-10-22 | Discharge: 2022-10-22 | Disposition: A | Discharge status: Home / Self Care | Payer: Managed Care, Other (non HMO) | Source: Ambulatory Visit | Attending: Plastic Surgery | Admitting: Plastic Surgery

## 2022-10-22 DIAGNOSIS — T8189XD Other complications of procedures, not elsewhere classified, subsequent encounter: Secondary | ICD-10-CM | POA: Diagnosis present

## 2022-10-22 DIAGNOSIS — Z170 Estrogen receptor positive status [ER+]: Secondary | ICD-10-CM | POA: Insufficient documentation

## 2022-10-22 DIAGNOSIS — Z9889 Other specified postprocedural states: Secondary | ICD-10-CM | POA: Diagnosis present

## 2022-10-22 DIAGNOSIS — C50911 Malignant neoplasm of unspecified site of right female breast: Secondary | ICD-10-CM | POA: Insufficient documentation

## 2022-10-22 MED ORDER — GADOBUTROL 1 MMOL/ML IV SOLN
8.0000 mL | Freq: Once | INTRAVENOUS | Status: AC | PRN
  Administered 2022-10-22: 8 mL via INTRAVENOUS

## 2022-10-22 NOTE — Progress Notes (Signed)
EMUNA, NINI ([id number]) [id number] Page 1 of 4 Visit Report for 10/21/2022 Abuse Risk Screen Details Patient Name: Date of Service: Emma Cuevas, Emma Cuevas 10/21/2022 8:30 A M Medical Record Number: [id number] Patient Account Number: [id number] Date of Birth/Sex: Treating RN: 1965-08-02 (57 y.o. Emma Cuevas Primary Care Ryver Zadrozny: Kandyce Rud Other Clinician: Referring Toshiyuki Fredell: Treating Sylvi Rybolt/Extender: Langston Reusing in Treatment: 0 Abuse Risk Screen Items Answer ABUSE RISK SCREEN: Has anyone close to you tried to hurt or harm you recentlyo No Do you feel uncomfortable with anyone in your familyo No Has anyone forced you do things that you didnt want to doo No Electronic Signature(s) Signed: 10/21/2022 5:21:23 PM By: Angelina Pih Entered By: Angelina Pih on 10/21/2022 08:42:18 -------------------------------------------------------------------------------- Activities of Daily Living Details Patient Name: Date of Service: Emma Cuevas, Emma Cuevas 10/21/2022 8:30 A M Medical Record Number: [id number] Patient Account Number: [id number] Date of Birth/Sex: Treating RN: [birth year] (57 y.o. Emma Cuevas Primary Care Hanako Tipping: Kandyce Rud Other Clinician: Referring Sharnee Douglass: Treating Izear Pine/Extender: Langston Reusing in Treatment: 0 Activities of Daily Living Items Answer Activities of Daily Living (Please select one for each item) Drive Automobile Completely Able T Medications ake Completely Able Use T elephone Completely Able Care for Appearance Completely Able Use T oilet Completely Able Bath / Shower Completely Able Dress Self Completely Able Feed Self Completely Able Walk Completely Able Get In / Out Bed Completely Able Housework Completely Able Prepare Meals Completely Able Handle Money Completely Able Shop for Self Completely Able Electronic Signature(s) Signed:  10/21/2022 5:21:23 PM By: Angelina Pih Entered By: Angelina Pih on 10/21/2022 08:42:44 -------------------------------------------------------------------------------- Education Screening Details Patient Name: Date of Service: Emma Dun. 10/21/2022 8:30 A M Medical Record Number: [id number] Patient Account Number: [id number] Date of Birth/Sex: Treating RN: 21-Nov-1965 (57 y.o. Emma Cuevas Primary Care Shantina Chronister: Kandyce Rud Other Clinician: Referring Yaman Grauberger: Treating Trae Bovenzi/Extender: Langston Reusing in Treatment: 0 Emma Cuevas, Emma Cuevas ([id number]) [id number] Page 2 of 4 Primary Learner Assessed: Patient Learning Preferences/Education Level/Primary Language Learning Preference: Explanation, Demonstration, Video, Communication Board, Printed Material Preferred Language: Economist Language Barrier: No Translator Needed: No Memory Deficit: No Emotional Barrier: No Cultural/Religious Beliefs Affecting Medical Care: No Physical Barrier Impaired Vision: No Impaired Hearing: No Decreased Hand dexterity: No Knowledge/Comprehension Knowledge Level: High Comprehension Level: High Ability to understand written instructions: High Ability to understand verbal instructions: High Motivation Anxiety Level: Calm Cooperation: Cooperative Education Importance: Acknowledges Need Interest in Health Problems: Asks Questions Perception: Coherent Willingness to Engage in Self-Management High Activities: Readiness to Engage in Self-Management High Activities: Electronic Signature(s) Signed: 10/21/2022 5:21:23 PM By: Angelina Pih Entered By: Angelina Pih on 10/21/2022 08:43:06 -------------------------------------------------------------------------------- Fall Risk Assessment Details Patient Name: Date of Service: Emma Dun. 10/21/2022 8:30 A M Medical Record Number: [id number] Patient  Account Number: [id number] Date of Birth/Sex: Treating RN: 20-Oct-1965 (58 y.o. Emma Cuevas Primary Care Carloyn Lahue: Kandyce Rud Other Clinician: Referring Kenasia Scheller: Treating Zebulin Siegel/Extender: Langston Reusing in Treatment: 0 Fall Risk Assessment Items Have you had 2 or more falls in the last 12 monthso 0 No Have you had any fall that resulted in injury in the last 12 monthso 0 No FALLS RISK SCREEN History of falling - immediate or within 3 months 0 No Secondary diagnosis (Do you have 2 or more medical diagnoseso) 0 No Ambulatory aid None/bed rest/wheelchair/nurse 0 Yes Crutches/cane/walker 0 No Furniture 0 No Intravenous therapy Access/Saline/Heparin Lock 0 No Gait/Transferring Normal/ bed  rest/ wheelchair 0 Yes Weak (short steps with or without shuffle, stooped but able to lift head while walking, may seek 0 No support from furniture) Impaired (short steps with shuffle, may have difficulty arising from chair, head down, impaired 0 No balance) Mental Status Oriented to own ability 0 Yes Overestimates or forgets limitations 0 No Risk Level: Low Risk Score: 0 Emma Cuevas, Emma Cuevas ([id number]) [phone] [id number] Page 3 of 4 Electronic Signature(s) -------------------------------------------------------------------------------- Foot Assessment Details Patient Name: Date of Service: Emma Cuevas, Emma Cuevas 10/21/2022 8:30 A M Medical Record Number: [id number] Patient Account Number: [id number] Date of Birth/Sex: Treating RN: [birth year] (57 y.o. Emma Cuevas Primary Care Vartan Kerins: Kandyce Rud Other Clinician: Referring Vuk Skillern: Treating Yehudis Monceaux/Extender: Halina Andreas Weeks in Treatment: 0 Foot Assessment Items Site Locations + = Sensation present, - = Sensation absent, C = Callus, U = Ulcer R = Redness, W = Warmth, M = Maceration, PU = Pre-ulcerative lesion F = Fissure, S = Swelling, D =  Dryness Assessment Right: Left: Other Deformity: No No Prior Foot Ulcer: No No Prior Amputation: No No Charcot Joint: No No Ambulatory Status: Ambulatory Without Help Gait: Steady Notes wound on right breast Electronic Signature(s) Signed: 10/21/2022 5:21:23 PM By: Angelina Pih Entered By: Angelina Pih on 10/21/2022 08:43:36 -------------------------------------------------------------------------------- Nutrition Risk Screening Details Patient Name: Date of Service: Emma Cuevas, Emma Cuevas 10/21/2022 8:30 A M Medical Record Number: [id number] Patient Account Number: [id number] Date of Birth/Sex: Treating RN: 21-Dec-1965 (57 y.o. Emma Cuevas Primary Care Xai Frerking: Kandyce Rud Other Clinician: Referring Jaella Weinert: Treating Franki Stemen/Extender: Halina Andreas Weeks in Treatment: 0 Height (in): 65 Weight (lbs): 184 Body Mass Index (BMI): 30.6 Emma Cuevas, Emma Cuevas ([id number]) [phone] [id number] Page 4 of 4 Nutrition Risk Screening Items Score Screening NUTRITION RISK SCREEN: I have an illness or condition that made me change the kind and/or amount of food I eat 0 No I eat fewer than two meals per day 0 No I eat few fruits and vegetables, or milk products 0 No I have three or more drinks of beer, liquor or wine almost every day 0 No I have tooth or mouth problems that make it hard for me to eat 0 No I don't always have enough money to buy the food I need 0 No I eat alone most of the time 0 No I take three or more different prescribed or over-the-counter drugs a day 0 No Without wanting to, I have lost or gained 10 pounds in the last six months 0 No I am not always physically able to shop, cook and/or feed myself 0 No Nutrition Protocols Good Risk Protocol 0 No interventions needed Moderate Risk Protocol High Risk Proctocol Risk Level: Good Risk Score: 0 Electronic Signature(s) Signed: 10/21/2022 5:21:23 PM By: Angelina Pih Entered By: Angelina Pih on 10/21/2022 08:43:22

## 2022-10-27 ENCOUNTER — Ambulatory Visit: Payer: Managed Care, Other (non HMO) | Admitting: Physician Assistant

## 2022-10-28 ENCOUNTER — Encounter: Payer: Managed Care, Other (non HMO) | Attending: Physician Assistant | Admitting: Internal Medicine

## 2022-10-28 DIAGNOSIS — T8131XD Disruption of external operation (surgical) wound, not elsewhere classified, subsequent encounter: Secondary | ICD-10-CM | POA: Insufficient documentation

## 2022-10-28 DIAGNOSIS — Z9889 Other specified postprocedural states: Secondary | ICD-10-CM | POA: Diagnosis not present

## 2022-10-28 DIAGNOSIS — X58XXXD Exposure to other specified factors, subsequent encounter: Secondary | ICD-10-CM | POA: Diagnosis not present

## 2022-10-28 DIAGNOSIS — Z9221 Personal history of antineoplastic chemotherapy: Secondary | ICD-10-CM | POA: Insufficient documentation

## 2022-10-28 DIAGNOSIS — L98491 Non-pressure chronic ulcer of skin of other sites limited to breakdown of skin: Secondary | ICD-10-CM | POA: Diagnosis not present

## 2022-10-28 DIAGNOSIS — Z853 Personal history of malignant neoplasm of breast: Secondary | ICD-10-CM | POA: Diagnosis not present

## 2022-10-28 DIAGNOSIS — Z923 Personal history of irradiation: Secondary | ICD-10-CM | POA: Insufficient documentation

## 2022-10-28 DIAGNOSIS — Z9011 Acquired absence of right breast and nipple: Secondary | ICD-10-CM | POA: Insufficient documentation

## 2022-10-29 ENCOUNTER — Encounter: Payer: Self-pay | Admitting: Oncology

## 2022-11-03 ENCOUNTER — Ambulatory Visit: Payer: Managed Care, Other (non HMO) | Admitting: Plastic Surgery

## 2022-11-03 ENCOUNTER — Encounter: Payer: Self-pay | Admitting: Plastic Surgery

## 2022-11-03 VITALS — BP 133/85 | HR 94

## 2022-11-03 DIAGNOSIS — C50911 Malignant neoplasm of unspecified site of right female breast: Secondary | ICD-10-CM

## 2022-11-03 DIAGNOSIS — Z170 Estrogen receptor positive status [ER+]: Secondary | ICD-10-CM

## 2022-11-03 DIAGNOSIS — S21002A Unspecified open wound of left breast, initial encounter: Secondary | ICD-10-CM

## 2022-11-03 NOTE — Progress Notes (Signed)
   Subjective:    Patient ID: Emma Cuevas, female    DOB: [birth year], 57 y.o.   MRN: [id number]  The patient is a 57 year old female here for evaluation of her left breast.  The patient underwent right breast reconstruction last year.  In December she had removal of the expander and placement of a 500 cc high-profile Mentor gel implant with a reduction of the left breast.  Since that time she has had skin breakdown of the left breast with redness.  According to the pictures it looks like something really irritated her skin after the reduction.  She then had breakdown at the vertical limb.  It has been very slow to heal.  She has tried different creams and is now going to the wound care center.      Review of Systems  Constitutional: Negative.   HENT: Negative.    Eyes: Negative.   Respiratory: Negative.  Negative for chest tightness and shortness of breath.   Cardiovascular: Negative.   Gastrointestinal: Negative.   Endocrine: Negative.   Genitourinary: Negative.        Objective:   Physical Exam Cardiovascular:     Rate and Rhythm: Normal rate.     Pulses: Normal pulses.  Chest:    Musculoskeletal:        General: Tenderness present. No swelling.  Skin:    General: Skin is warm.     Findings: Erythema and lesion present.  Neurological:     Mental Status: She is oriented to person, place, and time.  Psychiatric:        Mood and Affect: Mood normal.        Behavior: Behavior normal.        Thought Content: Thought content normal.        Judgment: Judgment normal.        Assessment & Plan:     ICD-10-CM   1. Malignant neoplasm of right female breast, unspecified estrogen receptor status, unspecified site of breast (HCC)  C50.911     2. Carcinoma of right breast, estrogen and progesterone receptor positive (HCC)  C50.911    Z17.0     3. Breast wound, left, initial encounter  S21.002A        I would like to see her back in 2 weeks.  I have had her start Vashe.   She should soak the gauze in the Vashe and then put it on the area in question for 10 minutes each day.  Then use the dressing that the wound care center gave her.  Pictures were obtained of the patient and placed in the chart with the patient's or guardian's permission.

## 2022-11-04 ENCOUNTER — Encounter: Payer: Managed Care, Other (non HMO) | Admitting: Internal Medicine

## 2022-11-04 DIAGNOSIS — T8131XA Disruption of external operation (surgical) wound, not elsewhere classified, initial encounter: Secondary | ICD-10-CM

## 2022-11-04 DIAGNOSIS — S21002A Unspecified open wound of left breast, initial encounter: Secondary | ICD-10-CM

## 2022-11-04 DIAGNOSIS — L98491 Non-pressure chronic ulcer of skin of other sites limited to breakdown of skin: Secondary | ICD-10-CM

## 2022-11-04 DIAGNOSIS — T8131XD Disruption of external operation (surgical) wound, not elsewhere classified, subsequent encounter: Secondary | ICD-10-CM | POA: Diagnosis not present

## 2022-11-05 NOTE — Progress Notes (Signed)
BISMAH, WEINTRAUB ([id number]) [id number] Page 1 of 5 Visit Report for 11/04/2022 Chief Complaint Document Details Patient Name: Date of Service: Emma Cuevas, Emma Cuevas 11/04/2022 12:30 PM Medical Record Number: [id number] Patient Account Number: [id number] Date of Birth/Sex: Treating RN: 1965/07/14 (57 y.o. Emma Cuevas Primary Care Provider: Kandyce Rud Other Clinician: Referring Provider: Treating Provider/Extender: Linwood Dibbles in Treatment: 2 Information Obtained from: Patient Chief Complaint 10/21/2022; left breast wound Electronic Signature(s) Signed: 11/04/2022 4:14:13 PM By: Geralyn Corwin DO Entered By: Geralyn Corwin on 11/04/2022 13:16:00 -------------------------------------------------------------------------------- HPI Details Patient Name: Date of Service: Emma Dun. 11/04/2022 12:30 PM Medical Record Number: [id number] Patient Account Number: [id number] Date of Birth/Sex: Treating RN: Apr 18, 1966 (57 y.o. Emma Cuevas Primary Care Provider: Kandyce Rud Other Clinician: Referring Provider: Treating Provider/Extender: Linwood Dibbles in Treatment: 2 History of Present Illness HPI Description: 10/21/2022 Ms. Nadyne Coombes is a 57 year old female with a past medical history of right-sided breast cancer status post chemotherapy and radiation that presents to the clinic for a left breast wound. She was diagnosed in October 2022 with invasive mammary carcinoma and started on chemotherapy for the right breast. In April 2023 she had a mastectomy of the right breast with expander insert by Dr. Karie Schwalbe aylor. Starting in June 2023 she had radiation and completed this on August 2023. She had no issues with wound healing on the right side. Her incision has healed here. In December 2023 she had a left breast reduction and lift. Since her surgery on the left side she has had issues with wound  healing. She had biopsy of this area in February 2024 that showed dense fibrosis and mixed inflammatory cell but no malignancy seen. Due to poor wound healing her oncologist Dr. Smith Robert stopped her chemotherapy. She has tried [employer] Ag and oil emulsion nonadherent dressing for her wound care. She currently denies signs of infection. 6/5; second visit for this patient to had a left breast reduction in December 2023. Some point after this surgery which was closed with derma glue. The patient the wound dehisced and she has had a chronic wound on this ever since. It does not sound like there was ever a significant infection in this area. She had biopsies done of the wound which did not show malignancy. Last week the orders were blast X with Promogran. She received neither 1 of these materials I am not sure what the issue was here. Yesterday she came in and she was given a Physicist, medical. She has been using this. Somebody had mentioned to her last week about making sure her bra did not rub on the wound which was a good suggestion she has bought a new bra 6/12; patient presents for follow-up. Eventually blast X made its way to the patient and she has been using this with collagen. She has been using a new bra that has reduced irritation. She has no issues or complaints today. The wound is much smaller today. Electronic Signature(s) Signed: 11/04/2022 4:14:13 PM By: Geralyn Corwin DO Entered By: Geralyn Corwin on 11/04/2022 13:16:35 -------------------------------------------------------------------------------- Physical Exam Details Patient Name: Date of Service: Emma Cuevas, Emma Cuevas 11/04/2022 12:30 PM Medical Record Number: [id number] Patient Account Number: [id number] Date of Birth/Sex: Treating RN: 11/08/1965 (57 y.o. Emma Cuevas Primary Care Provider: Kandyce Rud Other Clinician: Referring Provider: Treating Provider/Extender: Linwood Dibbles in Treatment:  2 TINASHE, HOSS ([id number]) [id number] Page 2 of 5 Constitutional . Psychiatric . Notes T the left breast  there is a small open wound with granulation tissue throughout. No signs of infection. No induration noted. No tenderness on palpation. o Electronic Signature(s) Signed: 11/04/2022 4:14:13 PM By: Geralyn Corwin DO Entered By: Geralyn Corwin on 11/04/2022 13:17:09 -------------------------------------------------------------------------------- Physician Orders Details Patient Name: Date of Service: Emma Dun. 11/04/2022 12:30 PM Medical Record Number: [id number] Patient Account Number: [id number] Date of Birth/Sex: Treating RN: 1966/04/28 (57 y.o. Emma Cuevas Primary Care Provider: Kandyce Rud Other Clinician: Referring Provider: Treating Provider/Extender: Linwood Dibbles in Treatment: 2 Verbal / Phone Orders: No Diagnosis Coding Follow-up Appointments Return Appointment in 1 week. Bathing/ [employer] wounds with antibacterial soap and water. May shower; gently cleanse wound with antibacterial soap, rinse and pat dry prior to dressing wounds - make sure wound is dry prior to dressing, wound should be covered at all times unless showering No tub bath. Other: - Try a new bra, one that is less snug and more breathable and see if this helps wound healing. Anesthetic (Use 'Patient Medications' Section for Anesthetic Order Entry) Lidocaine applied to wound bed Additional Orders / Instructions Other: - no swimming in pools or ocean at this time. If blastX and supplies do not arrive before next dressing change, you may use the gauze dressing supplied from office, cut a single layer to cover wound and then cover with outer dressing. Please call if by Friday if supplies do not arrive. Wound Treatment Wound #1 - Breast Wound Laterality: Left Cleanser: [employer] Ancillary Kit - 15 Day Supply (Generic) 1 x Per  Day/30 Days Discharge Instructions: Use supplies as instructed; Kit contains: (15) Saline Bullets; (15) 3x3 Gauze; 15 pr Gloves Cleanser: Soap and Water 1 x Per Day/30 Days Discharge Instructions: Gently cleanse wound with antibacterial soap, rinse and pat dry prior to dressing wounds Topical: BlastX 1 x Per Day/30 Days Prim Dressing: collagen 1 x Per Day/30 Days ary Discharge Instructions: moistened with blastX Secondary Dressing: (BORDER) Zetuvit Plus SILICONE BORDER Dressing 5x5 (in/in) (Generic) 1 x Per Day/30 Days Discharge Instructions: Please do not put silicone bordered dressings under wraps. Use non-bordered dressing only. Electronic Signature(s) Signed: 11/04/2022 4:14:13 PM By: Geralyn Corwin DO Entered By: Geralyn Corwin on 11/04/2022 13:19:12 -------------------------------------------------------------------------------- Problem List Details Patient Name: Date of Service: Emma Dun. 11/04/2022 12:30 PM Medical Record Number: [id number] Patient Account Number: [id number] Date of Birth/Sex: Treating RN: 1966-03-17 (57 y.o. 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 11/04/2022 12:30 PM Medical Record Number: [id number] Patient Account Number: [id number] Date of Birth/Sex: Treating RN: 12/01/1965 (57 y.o. Emma Cuevas Primary Care Provider: Kandyce Rud Other Clinician: Referring Provider: Treating Provider/Extender: Linwood Dibbles in Treatment: 2 Subjective Chief Complaint Information obtained from Patient 10/21/2022; left breast wound History of Present Illness (  HPI) 10/21/2022 Ms. Nadyne Coombes is a 56 year old female with a past medical history of right-sided breast cancer status post chemotherapy and radiation that presents to the clinic for a left breast wound. She was diagnosed in October 2022 with invasive mammary carcinoma and started on chemotherapy for the right breast. In April 2023 she had a mastectomy of the right breast with expander insert by Dr. Karie Schwalbe aylor. Starting in June 2023 she had radiation and completed this on August 2023. She had no issues with wound healing on the right side. Her incision has healed here. In December 2023 she had a left breast reduction and lift. Since her surgery on the left side she has had issues with wound healing. She had biopsy of this area in February 2024 that showed dense fibrosis and mixed inflammatory cell but no malignancy seen. Due to poor wound healing her oncologist Dr. Smith Robert stopped her chemotherapy. She has tried [employer] Ag and oil emulsion nonadherent dressing for her wound care. She currently denies signs of infection. 6/5; second visit for this patient to had a left breast reduction in December 2023. Some point after this surgery which was closed with derma glue. The patient the wound dehisced and she has had a chronic wound on this ever since. It does not sound like there was  ever a significant infection in this area. She had biopsies done of the wound which did not show malignancy. Last week the orders were blast X with Promogran. She received neither 1 of these materials I am not sure what the issue was here. Yesterday she came in and she was given a Physicist, medical. She has been using this. Somebody had mentioned to her last week about making sure her bra did not rub on the wound which was a good suggestion she has bought a new bra 6/12; patient presents for follow-up. Eventually blast X made its way to the patient and she has been using this with collagen. She has been using a new bra that has reduced irritation. She has no issues or complaints today. The wound is much smaller today. BHUMIKA, PELISSIER ([id number]) [id number] Page 4 of 5 Objective Constitutional Vitals Time Taken: 12:42 PM, Height: 65 in, Weight: 184 lbs, BMI: 30.6, Temperature: 98.1 F, Pulse: 76 bpm, Respiratory Rate: 18 breaths/min, Blood Pressure: 118/71 mmHg. General Notes: T the left breast there is a small open wound with granulation tissue throughout. No signs of infection. No induration noted. No tenderness on o palpation. Integumentary (Hair, Skin) Wound #1 status is Open. Original cause of wound was Surgical Injury. The date acquired was: 05/01/2022. The wound has been in treatment 2 weeks. The wound is located on the Left Breast. The wound measures 0.5cm length x 1.65cm width x 0.1cm depth; 0.648cm^2 area and 0.065cm^3 volume. There is Fat Layer (Subcutaneous Tissue) exposed. There is a medium amount of serosanguineous drainage noted. There is large (67-100%) red, pink granulation within the wound bed. There is a small (1-33%) amount of necrotic tissue within the wound bed including Adherent Slough. Assessment Active Problems ICD-10 Unspecified open wound of left breast, initial encounter Non-pressure chronic ulcer of skin of other sites limited to  breakdown of skin Disruption of external operation (surgical) wound, not elsewhere classified, initial encounter Malignant neoplasm of unspecified site of right female breast Patient's wound has shown improvement in size in appearance since last clinic visit. I recommended continuing blast X and collagen daily. Follow-up in 1 week. Plan Follow-up Appointments: Return Appointment in 1  week. Bathing/ Shower/ Hygiene: Wash wounds with antibacterial soap and water. May shower; gently cleanse wound with antibacterial soap, rinse and pat dry prior to dressing wounds - make sure wound is dry prior to dressing, wound should be covered at all times unless showering No tub bath. Other: - Try a new bra, one that is less snug and more breathable and see if this helps wound healing. Anesthetic (Use 'Patient Medications' Section for Anesthetic Order Entry): Lidocaine applied to wound bed Additional Orders / Instructions: Other: - no swimming in pools or ocean at this time. If blastX and supplies do not arrive before next dressing change, you may use the gauze dressing supplied from office, cut a single layer to cover wound and then cover with outer dressing. Please call if by Friday if supplies do not arrive. WOUND #1: - Breast Wound Laterality: Left Cleanser: [employer] Ancillary Kit - 15 Day Supply (Generic) 1 x Per Day/30 Days Discharge Instructions: Use supplies as instructed; Kit contains: (15) Saline Bullets; (15) 3x3 Gauze; 15 pr Gloves Cleanser: Soap and Water 1 x Per Day/30 Days Discharge Instructions: Gently cleanse wound with antibacterial soap, rinse and pat dry prior to dressing wounds Topical: BlastX 1 x Per Day/30 Days Prim Dressing: collagen 1 x Per Day/30 Days ary Discharge Instructions: moistened with blastX Secondary Dressing: (BORDER) Zetuvit Plus SILICONE BORDER Dressing 5x5 (in/in) (Generic) 1 x Per Day/30 Days Discharge Instructions: Please do not put silicone bordered dressings under  wraps. Use non-bordered dressing only. 1. Blast X and collagen daily 2. Follow-up in 1 week Electronic Signature(s) Signed: 11/04/2022 4:14:13 PM By: Geralyn Corwin DO Entered By: Geralyn Corwin on 11/04/2022 13:18:43 ROS/PFSH Details -------------------------------------------------------------------------------- Emma Dun ([id number]) [id number] Page 5 of 5 Patient Name: Date of Service: Emma Cuevas, Emma Cuevas 11/04/2022 12:30 PM Medical Record Number: [id number] Patient Account Number: [id number] Date of Birth/Sex: Treating RN: March 08, 1966 (57 y.o. Emma Cuevas Primary Care Provider: Kandyce Rud Other Clinician: Referring Provider: Treating Provider/Extender: Linwood Dibbles in Treatment: 2 Information Obtained From Patient Immunizations Pneumococcal Vaccine: Received Pneumococcal Vaccination: No Implantable Devices None Family and Social History Never smoker; Alcohol Use: Never; Drug Use: No History; Caffeine Use: Daily - coffee Electronic Signature(s) Signed: 11/04/2022 4:14:13 PM By: Geralyn Corwin DO Signed: 11/04/2022 4:30:42 PM By: Midge Aver MSN RN CNS WTA Entered By: Geralyn Corwin on 11/04/2022 13:19:21 -------------------------------------------------------------------------------- SuperBill Details Patient Name: Date of Service: Emma Dun. 11/04/2022 Medical Record Number: [id number] Patient Account Number: [id number] Date of Birth/Sex: Treating RN: Dec 19, 1965 (57 y.o. 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 T81.31XA Disruption of external operation (surgical)  wound, not elsewhere classified, initial encounter C50.911 Malignant neoplasm of unspecified site of right female breast Facility Procedures : CPT4 Code: 13244010 Description: 99213 - WOUND CARE VISIT-LEV 3 EST PT Modifier: Quantity: 1 Physician Procedures : CPT4 Code Description Modifier [id number] - WC PHYS LEVEL 3 - EST PT ICD-10 Diagnosis Description S21.002A Unspecified open wound of left breast, initial encounter 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 

## 2022-11-05 NOTE — Progress Notes (Signed)
Emma Cuevas ([id number]) [id number] Page 1 of 7 Visit Report for 11/04/2022 Arrival Information Details Patient Name: Date of Service: Emma Cuevas, Emma Cuevas 11/04/2022 12:30 PM Medical Record Number: [id number] Patient Account Number: [id number] Date of Birth/Sex: Treating RN: 1966-01-15 (57 y.o. [name] Primary Care Carole Deere: Kandyce Rud Other Clinician: Referring Lycan Davee: Treating Wade Asebedo/Extender: Linwood Dibbles in Treatment: 2 Visit Information History Since Last Visit Added or deleted any medications: No Patient Arrived: Ambulatory Has Dressing in Place as Prescribed: Yes Arrival Time: 12:41 Pain Present Now: No Accompanied By: self Transfer Assistance: None Patient Requires Transmission-Based Precautions: No Patient Has Alerts: No Electronic Signature(s) Signed: 11/04/2022 4:30:42 PM By: Midge Aver MSN RN CNS WTA Entered By: Midge Aver on 11/04/2022 12:41:58 -------------------------------------------------------------------------------- Clinic Level of Care Assessment Details Patient Name: Date of Service: Emma Cuevas 11/04/2022 12:30 PM Medical Record Number: [id number] Patient Account Number: [id number] Date of Birth/Sex: Treating RN: [birth year] (57 y.o. [name] Primary Care Adarsh Mundorf: Kandyce Rud Other Clinician: Referring Aleayah Chico: Treating Dorothey Oetken/Extender: Linwood Dibbles in Treatment: 2 Clinic Level of Care Assessment Items TOOL 4 Quantity Score X- 1 0 Use when only an EandM is performed on FOLLOW-UP visit ASSESSMENTS - Nursing Assessment / Reassessment X- 1 10 Reassessment of Co-morbidities (includes updates in patient status) X- 1 5 Reassessment of Adherence to Treatment Plan ASSESSMENTS - Wound and Skin A ssessment / Reassessment X - Simple Wound Assessment / Reassessment - one wound 1 5 []  - 0 Complex Wound Assessment / Reassessment - multiple  wounds []  - 0 Dermatologic / Skin Assessment (not related to wound area) ASSESSMENTS - Focused Assessment []  - 0 Circumferential Edema Measurements - multi extremities []  - 0 Nutritional Assessment / Counseling / Intervention []  - 0 Lower Extremity Assessment (monofilament, tuning fork, pulses) []  - 0 Peripheral Arterial Disease Assessment (using hand held doppler) ASSESSMENTS - Ostomy and/or Continence Assessment and Care []  - 0 Incontinence Assessment and Management []  - 0 Ostomy Care Assessment and Management (repouching, etc.) PROCESS - Coordination of Care X - Simple Patient / Family Education for ongoing care 1 15 []  - 0 Complex (extensive) Patient / Family Education for ongoing care X- 1 10 Staff obtains Consents, Records, T Results / Process Orders est []  - 0 Staff telephones HHA, Nursing Homes / Clarify orders / etc Emma Cuevas ([id number]) [phone].pdf Page 2 of 7 []  - 0 Routine Transfer to another Facility (non-emergent condition) []  - 0 Routine Hospital Admission (non-emergent condition) []  - 0 New Admissions / Manufacturing engineer / Ordering NPWT Apligraf, etc. , []  - 0 Emergency Hospital Admission (emergent condition) X- 1 10 Simple Discharge Coordination []  - 0 Complex (extensive) Discharge Coordination PROCESS - Special Needs []  - 0 Pediatric / Minor Patient Management []  - 0 Isolation Patient Management []  - 0 Hearing / Language / Visual special needs []  - 0 Assessment of Community assistance (transportation, D/C planning, etc.) []  - 0 Additional assistance / Altered mentation []  - 0 Support Surface(s) Assessment (bed, cushion, seat, etc.) INTERVENTIONS - Wound Cleansing / Measurement X - Simple Wound Cleansing - one wound 1 5 []  - 0 Complex Wound Cleansing - multiple wounds X- 1 5 Wound Imaging (photographs - any number of wounds) []  - 0 Wound Tracing (instead of photographs) X- 1 5 Simple Wound  Measurement - one wound []  - 0 Complex Wound Measurement - multiple wounds INTERVENTIONS - Wound Dressings X - Small Wound Dressing one or multiple wounds 1 10 []  -  0 Medium Wound Dressing one or multiple wounds []  - 0 Large Wound Dressing one or multiple wounds []  - 0 Application of Medications - topical []  - 0 Application of Medications - injection INTERVENTIONS - Miscellaneous []  - 0 External ear exam []  - 0 Specimen Collection (cultures, biopsies, blood, body fluids, etc.) []  - 0 Specimen(s) / Culture(s) sent or taken to Lab for analysis []  - 0 Patient Transfer (multiple staff / Michiel Sites Lift / Similar devices) []  - 0 Simple Staple / Suture removal (25 or less) []  - 0 Complex Staple / Suture removal (26 or more) []  - 0 Hypo / Hyperglycemic Management (close monitor of Blood Glucose) []  - 0 Ankle / Brachial Index (ABI) - do not check if billed separately X- 1 5 Vital Signs Has the patient been seen at the hospital within the last three years: Yes Total Score: 85 Level Of Care: New/Established - Level 3 Electronic Signature(s) Signed: 11/04/2022 4:30:42 PM By: Midge Aver MSN RN CNS WTA Entered By: Midge Aver on 11/04/2022 12:54:15 Encounter Discharge Information Details -------------------------------------------------------------------------------- Emma Cuevas ([id number]) [id number] Page 3 of 7 Patient Name: Date of Service: Emma Cuevas 11/04/2022 12:30 PM Medical Record Number: [id number] Patient Account Number: [id number] Date of Birth/Sex: Treating RN: 1965-10-27 (57 y.o. [name] Primary Care Madeliene Tejera: Kandyce Rud Other Clinician: Referring Ryker Sudbury: Treating Taner Rzepka/Extender: Linwood Dibbles in Treatment: 2 Encounter Discharge Information Items Discharge Condition: Stable Ambulatory Status: Ambulatory Discharge Destination: Home Transportation: Private Auto Accompanied By:  self Schedule Follow-up Appointment: Yes Clinical Summary of Care: Electronic Signature(s) Signed: 11/04/2022 4:30:42 PM By: Midge Aver MSN RN CNS WTA Entered By: Midge Aver on 11/04/2022 12:56:11 -------------------------------------------------------------------------------- Lower Extremity Assessment Details Patient Name: Date of Service: Emma Cuevas, CHAUDHURI. 11/04/2022 12:30 PM Medical Record Number: [id number] Patient Account Number: [id number] Date of Birth/Sex: Treating RN: 07-17-1965 (57 y.o. [name] Primary Care Balin Vandegrift: Kandyce Rud Other Clinician: Referring Zarinah Oviatt: Treating Derreon Consalvo/Extender: Linwood Dibbles in Treatment: 2 Electronic Signature(s) Signed: 11/04/2022 4:30:42 PM By: Midge Aver MSN RN CNS WTA Entered By: Midge Aver on 11/04/2022 12:49:38 -------------------------------------------------------------------------------- Multi Wound Chart Details Patient Name: Date of Service: Emma Cuevas. 11/04/2022 12:30 PM Medical Record Number: [id number] Patient Account Number: [id number] Date of Birth/Sex: Treating RN: Oct 09, 1965 (57 y.o. [name] Primary Care Kody Vigil: Kandyce Rud Other Clinician: Referring Minor Iden: Treating Shakya Sebring/Extender: Linwood Dibbles in Treatment: 2 Vital Signs Height(in): 65 Pulse(bpm): 76 Weight(lbs): 184 Blood Pressure(mmHg): 118/71 Body Mass Index(BMI): 30.6 Temperature(F): 98.1 Respiratory Rate(breaths/min): 18 [1:Photos:] [N/A:N/A] Left Breast N/A N/A Wound Location: Surgical Injury N/A N/A Wounding Event: Open Surgical Wound N/A N/A Primary Etiology: 05/01/2022 N/A N/A Date Acquired: 2 N/A N/A Weeks of Treatment: Open N/A N/A Wound Status: No N/A N/A Wound Recurrence: Emma Cuevas, Emma Cuevas ([id number]) [id number] Page 4 of 7 Yes N/A N/A Clustered Wound: 2 N/A N/A Clustered Quantity: 0.5x1.65x0.1 N/A N/A Measurements L  x W x D (cm) 0.648 N/A N/A A (cm) : rea 0.065 N/A N/A Volume (cm) : 97.10% N/A N/A % Reduction in Area: 97.10% N/A N/A % Reduction in Volume: Full Thickness Without Exposed N/A N/A Classification: Support Structures Medium N/A N/A Exudate A mount: Serosanguineous N/A N/A Exudate Type: red, brown N/A N/A Exudate Color: Large (67-100%) N/A N/A Granulation A mount: Red, Pink N/A N/A Granulation Quality: Small (1-33%) N/A N/A Necrotic A mount: Fat Layer (Subcutaneous Tissue): Yes N/A N/A Exposed Structures: None N/A N/A Epithelialization: Treatment Notes Electronic Signature(s) Signed:  11/04/2022 4:30:42 PM By: Midge Aver MSN RN CNS WTA Entered By: Midge Aver on 11/04/2022 12:52:21 -------------------------------------------------------------------------------- Multi-Disciplinary Care Plan Details Patient Name: Date of Service: Emma Cuevas. 11/04/2022 12:30 PM Medical Record Number: [id number] Patient Account Number: [id number] Date of Birth/Sex: Treating RN: 1965-07-01 (57 y.o. [name] Primary Care Salimatou Simone: Kandyce Rud Other Clinician: Referring Arnetra Terris: Treating Aeisha Minarik/Extender: Linwood Dibbles in Treatment: 2 Active Inactive Wound/Skin Impairment Nursing Diagnoses: Impaired tissue integrity Knowledge deficit related to ulceration/compromised skin integrity Goals: Ulcer/skin breakdown will have a volume reduction of 30% by week 4 Date Initiated: 10/21/2022 Target Resolution Date: 11/18/2022 Goal Status: Active Ulcer/skin breakdown will have a volume reduction of 50% by week 8 Date Initiated: 10/21/2022 Target Resolution Date: 12/16/2022 Goal Status: Active Ulcer/skin breakdown will have a volume reduction of 80% by week 12 Date Initiated: 10/21/2022 Target Resolution Date: 01/13/2023 Goal Status: Active Ulcer/skin breakdown will heal within 14 weeks Date Initiated: 10/21/2022 Target Resolution Date: 01/27/2023 Goal  Status: Active Interventions: Assess patient/caregiver ability to obtain necessary supplies Assess patient/caregiver ability to perform ulcer/skin care regimen upon admission and as needed Assess ulceration(s) every visit Provide education on ulcer and skin care Treatment Activities: Skin care regimen initiated : 10/21/2022 Topical wound management initiated : 10/21/2022 Notes: Electronic Signature(s) Signed: 11/04/2022 4:30:42 PM By: Midge Aver MSN RN CNS Edwena Felty, Michelle Piper ([id number]) [id number] Page 5 of 7 Entered By: Midge Aver on 11/04/2022 12:55:04 -------------------------------------------------------------------------------- Pain Assessment Details Patient Name: Date of Service: Emma Cuevas, Emma Cuevas 11/04/2022 12:30 PM Medical Record Number: [id number] Patient Account Number: [id number] Date of Birth/Sex: Treating RN: [birth year] (57 y.o. [name] Primary Care Sachi Boulay: Kandyce Rud Other Clinician: Referring Eladia Frame: Treating Jastin Fore/Extender: Linwood Dibbles in Treatment: 2 Active Problems Location of Pain Severity and Description of Pain Patient Has Paino No Site Locations Pain Management and Medication Current Pain Management: Electronic Signature(s) Signed: 11/04/2022 4:30:42 PM By: Midge Aver MSN RN CNS WTA Entered By: Midge Aver on 11/04/2022 12:43:14 -------------------------------------------------------------------------------- Patient/Caregiver Education Details Patient Name: Date of Service: Emma Cuevas 6/12/2024andnbsp12:30 PM Medical Record Number: [id number] Patient Account Number: [id number] Date of Birth/Gender: Treating RN: 22-Sep-1965 (57 y.o. [name] Primary Care Physician: Kandyce Rud Other Clinician: Referring Physician: Treating Physician/Extender: Linwood Dibbles in Treatment: 2 Education Assessment Education Provided  To: Patient Education Topics Provided Wound/Skin Impairment: Handouts: Caring for Your Ulcer Methods: Explain/Verbal Responses: State content correctly Electronic Signature(s) Signed: 11/04/2022 4:30:42 PM By: Midge Aver MSN RN CNS WTA Entered By: Midge Aver on 11/04/2022 12:55:21 Emma Cuevas ([id number]) [id number] Page 6 of 7 -------------------------------------------------------------------------------- Wound Assessment Details Patient Name: Date of Service: Emma Cuevas, Emma Cuevas 11/04/2022 12:30 PM Medical Record Number: [id number] Patient Account Number: [id number] Date of Birth/Sex: Treating RN: [birth year] (57 y.o. [name] Primary Care Cloee Dunwoody: Kandyce Rud Other Clinician: Referring Kieli Golladay: Treating Jasn Xia/Extender: Linwood Dibbles in Treatment: 2 Wound Status Wound Number: 1 Primary Etiology: Open Surgical Wound Wound Location: Left Breast Wound Status: Open Wounding Event: Surgical Injury Date Acquired: 05/01/2022 Weeks Of Treatment: 2 Clustered Wound: Yes Photos Wound Measurements Length: (cm) Width: (cm) Depth: (cm) Clustered Quantity: Area: (cm) Volume: (cm) 0.5 % Reduction in Area: 97.1% 1.65 % Reduction in Volume: 97.1% 0.1 Epithelialization: None 2 0.648 0.065 Wound Description Classification: Full Thickness Without Exposed Sup Exudate Amount: Medium Exudate Type: Serosanguineous Exudate Color: red, brown port Structures Foul Odor After Cleansing: No Slough/Fibrino Yes Wound Bed Granulation Amount: Large (67-100%) Exposed Structure  Granulation Quality: Red, Pink Fat Layer (Subcutaneous Tissue) Exposed: Yes Necrotic Amount: Small (1-33%) Necrotic Quality: Adherent Slough Treatment Notes Wound #1 (Breast) Wound Laterality: Left Cleanser [place] Ancillary Kit - 15 Day Supply Discharge Instruction: Use supplies as instructed; Kit contains: (15) Saline Bullets; (15) 3x3 Gauze; 15  pr Gloves Soap and Water Discharge Instruction: Gently cleanse wound with antibacterial soap, rinse and pat dry prior to dressing wounds Peri-Wound Care Topical BlastX Primary Dressing collagen Discharge Instruction: moistened with CHARLIA, GENNETT ([id number]) [id number] Page 7 of 7 Secondary Dressing (BORDER) Zetuvit Plus SILICONE BORDER Dressing 5x5 (in/in) Discharge Instruction: Please do not put silicone bordered dressings under wraps. Use non-bordered dressing only. Secured With Compression Wrap Compression Stockings Facilities manager) Signed: 11/04/2022 4:30:42 PM By: Midge Aver MSN RN CNS WTA Entered By: Midge Aver on 11/04/2022 12:49:21 -------------------------------------------------------------------------------- Vitals Details Patient Name: Date of Service: Emma Cuevas. 11/04/2022 12:30 PM Medical Record Number: [id number] Patient Account Number: [id number] Date of Birth/Sex: Treating RN: February 02, 1966 (57 y.o. [name] Primary Care Jaliel Deavers: Kandyce Rud Other Clinician: Referring Othel Hoogendoorn: Treating Alberto Pina/Extender: Linwood Dibbles in Treatment: 2 Vital Signs Time Taken: 12:42 Temperature (F): 98.1 Height (in): 65 Pulse (bpm): 76 Weight (lbs): 184 Respiratory Rate (breaths/min): 18 Body Mass Index (BMI): 30.6 Blood Pressure (mmHg): 118/71 Reference Range: 80 - 120 mg / dl Electronic Signature(s) Signed: 11/04/2022 4:30:42 PM By: Midge Aver MSN RN CNS WTA Entered By: Midge Aver on 11/04/2022 12:43:06

## 2022-11-11 ENCOUNTER — Encounter (HOSPITAL_BASED_OUTPATIENT_CLINIC_OR_DEPARTMENT_OTHER): Payer: Managed Care, Other (non HMO) | Admitting: Internal Medicine

## 2022-11-11 DIAGNOSIS — C50911 Malignant neoplasm of unspecified site of right female breast: Secondary | ICD-10-CM | POA: Diagnosis not present

## 2022-11-11 DIAGNOSIS — S21002A Unspecified open wound of left breast, initial encounter: Secondary | ICD-10-CM | POA: Diagnosis not present

## 2022-11-11 DIAGNOSIS — T8131XD Disruption of external operation (surgical) wound, not elsewhere classified, subsequent encounter: Secondary | ICD-10-CM | POA: Diagnosis not present

## 2022-11-11 DIAGNOSIS — T8131XA Disruption of external operation (surgical) wound, not elsewhere classified, initial encounter: Secondary | ICD-10-CM | POA: Diagnosis not present

## 2022-11-11 DIAGNOSIS — L98491 Non-pressure chronic ulcer of skin of other sites limited to breakdown of skin: Secondary | ICD-10-CM | POA: Diagnosis not present

## 2022-11-12 NOTE — Progress Notes (Signed)
BAANI, ANGLES ([id number]) [id number] Page 1 of 8 Visit Report for 11/11/2022 Arrival Information Details Patient Name: Date of Service: Emma Cuevas, Emma Cuevas 11/11/2022 12:30 PM Medical Record Number: [id number] Patient Account Number: [id number] Date of Birth/Sex: Treating RN: 04-Oct-1965 (57 y.o. [name] Primary Care Jarmarcus Wambold: Kandyce Rud Other Clinician: Referring Adain Geurin: Treating Anzel Kearse/Extender: Linwood Dibbles in Treatment: 3 Visit Information History Since Last Visit Added or deleted any medications: No Patient Arrived: Ambulatory Has Dressing in Place as Prescribed: Yes Arrival Time: 12:34 Pain Present Now: No Accompanied By: self Transfer Assistance: None Patient Identification Verified: Yes Secondary Verification Process Completed: Yes Patient Requires Transmission-Based Precautions: No Patient Has Alerts: No Electronic Signature(s) Signed: 11/12/2022 4:40:27 PM By: Midge Aver MSN RN CNS WTA Entered By: Midge Aver on 11/11/2022 12:34:39 -------------------------------------------------------------------------------- Clinic Level of Care Assessment Details Patient Name: Date of Service: Emma Cuevas, Emma Cuevas 11/11/2022 12:30 PM Medical Record Number: [id number] Patient Account Number: [id number] Date of Birth/Sex: Treating RN: [birth year] (57 y.o. [name] Primary Care Conna Terada: Kandyce Rud Other Clinician: Referring Aksel Bencomo: Treating Ashya Nicolaisen/Extender: Linwood Dibbles in Treatment: 3 Clinic Level of Care Assessment Items TOOL 4 Quantity Score X- 1 0 Use when only an EandM is performed on FOLLOW-UP visit ASSESSMENTS - Nursing Assessment / Reassessment X- 1 10 Reassessment of Co-morbidities (includes updates in patient status) X- 1 5 Reassessment of Adherence to Treatment Plan ASSESSMENTS - Wound and Skin A ssessment / Reassessment X - Simple Wound Assessment /  Reassessment - one wound 1 5 []  - 0 Complex Wound Assessment / Reassessment - multiple wounds Emma Cuevas, Emma Cuevas ([id number]) [id number] Page 2 of 8 []  - 0 Dermatologic / Skin Assessment (not related to wound area) ASSESSMENTS - Focused Assessment []  - 0 Circumferential Edema Measurements - multi extremities []  - 0 Nutritional Assessment / Counseling / Intervention []  - 0 Lower Extremity Assessment (monofilament, tuning fork, pulses) []  - 0 Peripheral Arterial Disease Assessment (using hand held doppler) ASSESSMENTS - Ostomy and/or Continence Assessment and Care []  - 0 Incontinence Assessment and Management []  - 0 Ostomy Care Assessment and Management (repouching, etc.) PROCESS - Coordination of Care X - Simple Patient / Family Education for ongoing care 1 15 []  - 0 Complex (extensive) Patient / Family Education for ongoing care X- 1 10 Staff obtains Chiropractor, Records, T Results / Process Orders est []  - 0 Staff telephones HHA, Nursing Homes / Clarify orders / etc []  - 0 Routine Transfer to another Facility (non-emergent condition) []  - 0 Routine Hospital Admission (non-emergent condition) []  - 0 New Admissions / Manufacturing engineer / Ordering NPWT Apligraf, etc. , []  - 0 Emergency Hospital Admission (emergent condition) X- 1 10 Simple Discharge Coordination []  - 0 Complex (extensive) Discharge Coordination PROCESS - Special Needs []  - 0 Pediatric / Minor Patient Management []  - 0 Isolation Patient Management []  - 0 Hearing / Language / Visual special needs []  - 0 Assessment of Community assistance (transportation, D/C planning, etc.) []  - 0 Additional assistance / Altered mentation []  - 0 Support Surface(s) Assessment (bed, cushion, seat, etc.) INTERVENTIONS - Wound Cleansing / Measurement X - Simple Wound Cleansing - one wound 1 5 []  - 0 Complex Wound Cleansing - multiple wounds X- 1 5 Wound Imaging (photographs - any number  of wounds) []  - 0 Wound Tracing (instead of photographs) X- 1 5 Simple Wound Measurement - one wound []  - 0 Complex Wound Measurement - multiple wounds INTERVENTIONS - Wound Dressings X - Small  Wound Dressing one or multiple wounds 1 10 []  - 0 Medium Wound Dressing one or multiple wounds []  - 0 Large Wound Dressing one or multiple wounds []  - 0 Application of Medications - topical []  - 0 Application of Medications - injection INTERVENTIONS - Miscellaneous []  - 0 External ear exam []  - 0 Specimen Collection (cultures, biopsies, blood, body fluids, etc.) []  - 0 Specimen(s) / Culture(s) sent or taken to Lab for analysis Emma Cuevas, Emma Cuevas ([id number]) [phone].pdf Page 3 of 8 []  - 0 Patient Transfer (multiple staff / Nurse, adult / Similar devices) []  - 0 Simple Staple / Suture removal (25 or less) []  - 0 Complex Staple / Suture removal (26 or more) []  - 0 Hypo / Hyperglycemic Management (close monitor of Blood Glucose) []  - 0 Ankle / Brachial Index (ABI) - do not check if billed separately X- 1 5 Vital Signs Has the patient been seen at the hospital within the last three years: Yes Total Score: 85 Level Of Care: New/Established - Level 3 Electronic Signature(s) Signed: 11/12/2022 4:40:27 PM By: Midge Aver MSN RN CNS WTA Entered By: Midge Aver on 11/11/2022 13:00:12 -------------------------------------------------------------------------------- Encounter Discharge Information Details Patient Name: Date of Service: Emma Dun. 11/11/2022 12:30 PM Medical Record Number: [id number] Patient Account Number: [id number] Date of Birth/Sex: Treating RN: 1965/11/13 (57 y.o. [name] Primary Care Rowen Wilmer: Kandyce Rud Other Clinician: Referring Lahna Nath: Treating Tayveon Lombardo/Extender: Linwood Dibbles in Treatment: 3 Encounter Discharge Information Items Discharge Condition: Stable Ambulatory Status:  Ambulatory Discharge Destination: Home Transportation: Private Auto Accompanied By: self Schedule Follow-up Appointment: No Clinical Summary of Care: Electronic Signature(s) Signed: 11/12/2022 4:40:27 PM By: Midge Aver MSN RN CNS WTA Entered By: Midge Aver on 11/11/2022 13:02:09 -------------------------------------------------------------------------------- Lower Extremity Assessment Details Patient Name: Date of Service: Emma Cuevas, ZAPPONE. 11/11/2022 12:30 PM Medical Record Number: [id number] Patient Account Number: [id number] Date of Birth/Sex: Treating RN: 07-17-1965 (57 y.o. [name] Primary Care Rosamae Rocque: Kandyce Rud Other Clinician: Referring Ambrosio Reuter: Treating Jillaine Waren/Extender: Dierdre Highman [place], Michelle Piper ([id number]) [phone].pdf Page 4 of 8 Weeks in Treatment: 3 Electronic Signature(s) Signed: 11/12/2022 4:40:27 PM By: Midge Aver MSN RN CNS WTA Entered By: Midge Aver on 11/11/2022 12:56:57 -------------------------------------------------------------------------------- Multi Wound Chart Details Patient Name: Date of Service: Emma Dun. 11/11/2022 12:30 PM Medical Record Number: [id number] Patient Account Number: [id number] Date of Birth/Sex: Treating RN: [birth year] (57 y.o. [name] Primary Care Lakeia Bradshaw: Kandyce Rud Other Clinician: Referring Dametria Tuzzolino: Treating Tae Vonada/Extender: Linwood Dibbles in Treatment: 3 Vital Signs Height(in): 65 Pulse(bpm): 81 Weight(lbs): 184 Blood Pressure(mmHg): 125/79 Body Mass Index(BMI): 30.6 Temperature(F): 97.7 Respiratory Rate(breaths/min): 18 [1:Photos: No Photos Left Breast Wound Location: Surgical Injury Wounding Event: Open Surgical Wound Primary Etiology: 05/01/2022 Date Acquired: 3 Weeks of Treatment: Healed - Epithelialized Wound Status: No Wound Recurrence: Yes Clustered Wound: 2 Clustered  Quantity: 0x0x0 Measurements L  x W x D (cm) 0 A (cm) : rea 0 Volume (cm) : 100.00% % Reduction in A rea: 100.00% % Reduction in Volume: Full Thickness Without Exposed Classification: Support Structures Medium Exudate A mount: Serosanguineous Exudate  Type: red, brown Exudate Color: Large (67-100%) Granulation A mount: Red, Pink Granulation Quality: Small (1-33%) Necrotic A mount: Fat Layer (Subcutaneous Tissue): Yes N/A Exposed Structures: None Epithelialization:] [N/A:N/A N/A N/A N/A N/A N/A N/A N/A  N/A N/A N/A N/A N/A N/A N/A N/A N/A N/A N/A N/A N/A N/A N/A] Treatment Notes Electronic Signature(s) Signed: 11/12/2022 4:40:27 PM By: Katrinka Blazing,  Chip Boer MSN RN CNS WTA Entered By: Midge Aver on 11/11/2022 12:57:02 Emma Dun ([id number]) [id number] Page 5 of 8 -------------------------------------------------------------------------------- Multi-Disciplinary Care Plan Details Patient Name: Date of Service: Emma Cuevas, Emma Cuevas 11/11/2022 12:30 PM Medical Record Number: [id number] Patient Account Number: [id number] Date of Birth/Sex: Treating RN: 1966/01/01 (57 y.o. [name] Primary Care Kerman Pfost: Kandyce Rud Other Clinician: Referring Farha Dano: Treating Nasir Bright/Extender: Linwood Dibbles in Treatment: 3 Active Inactive Electronic Signature(s) Signed: 11/12/2022 4:40:27 PM By: Midge Aver MSN RN CNS WTA Entered By: Midge Aver on 11/11/2022 13:00:31 -------------------------------------------------------------------------------- Pain Assessment Details Patient Name: Date of Service: Emma Cuevas, Emma Cuevas 11/11/2022 12:30 PM Medical Record Number: [id number] Patient Account Number: [id number] Date of Birth/Sex: Treating RN: [birth year] (57 y.o. [name] Primary Care Hawkins Seaman: Kandyce Rud Other Clinician: Referring Skyy Nilan: Treating Doloris Servantes/Extender: Linwood Dibbles in Treatment: 3 Active Problems Location of Pain Severity and  Description of Pain Patient Has Paino No Site Locations [place], [place] ([id number]) [phone].pdf Page 6 of 8 Pain Management and Medication Current Pain Management: Electronic Signature(s) Signed: 11/12/2022 4:40:27 PM By: Midge Aver MSN RN CNS WTA Entered By: Midge Aver on 11/11/2022 12:41:36 -------------------------------------------------------------------------------- Patient/Caregiver Education Details Patient Name: Date of Service: Emma Dun 6/19/2024andnbsp12:30 PM Medical Record Number: [id number] Patient Account Number: [id number] Date of Birth/Gender: Treating RN: [birth year] (57 y.o. [name] Primary Care Physician: Kandyce Rud Other Clinician: Referring Physician: Treating Physician/Extender: Linwood Dibbles in Treatment: 3 Education Assessment Education Provided To: Patient Education Topics Provided Discharge Packet: Methods: Explain/Verbal Responses: State content correctly Electronic Signature(s) Signed: 11/12/2022 4:40:27 PM By: Midge Aver MSN RN CNS WTA Entered By: Midge Aver on 11/11/2022 13:01:13 -------------------------------------------------------------------------------- Wound Assessment Details Patient Name: Date of Service: Emma Dun. 11/11/2022 12:30 PM Medical Record Number: [id number] Patient Account Number: [id number] Date of Birth/Sex: Treating RN: 1966/04/25 (57 y.o. [name] Primary Care Xaidyn Kepner: Kandyce Rud Other Clinician: Referring Lancer Thurner: Treating Roosevelt Bisher/Extender: Linwood Dibbles in Treatment: 3 Wound Status Wound Number: 1 Primary Etiology: Open Surgical Wound Wound Location: Left Breast Wound Status: Healed Emma Cuevas, Emma Cuevas ([id number]) [phone].pdf Page 7 of 8 Wounding Event: Surgical Injury Date Acquired: 05/01/2022 Weeks Of Treatment: 3 Clustered Wound: Yes Wound  Measurements Length: (cm) Width: (cm) Depth: (cm) Clustered Quantity: Area: (cm) Volume: (cm) 0 % Reduction in Area: 100% 0 % Reduction in Volume: 100% 0 Epithelialization: None 2 0 0 Wound Description Classification: Full Thickness Without Exposed Sup Exudate Amount: Medium Exudate Type: Serosanguineous Exudate Color: red, brown port Structures Foul Odor After Cleansing: No Slough/Fibrino Yes Wound Bed Granulation Amount: Large (67-100%) Exposed Structure Granulation Quality: Red, Pink Fat Layer (Subcutaneous Tissue) Exposed: Yes Necrotic Amount: Small (1-33%) Treatment Notes Wound #1 (Breast) Wound Laterality: Left Cleanser Peri-Wound Care Topical Primary Dressing Secondary Dressing Secured With Compression Wrap Compression Stockings Add-Ons Electronic Signature(s) Signed: 11/12/2022 4:40:27 PM By: Midge Aver MSN RN CNS WTA Entered By: Midge Aver on 11/11/2022 12:56:52 -------------------------------------------------------------------------------- Vitals Details Patient Name: Date of Service: Emma Dun. 11/11/2022 12:30 PM Medical Record Number: [id number] Patient Account Number: [id number] Date of Birth/Sex: Treating RN: 1966-05-07 (57 y.o. [name] Primary Care Arabela Basaldua: Kandyce Rud Other Clinician: Referring Jax Abdelrahman: Treating Janiel Crisostomo/Extender: Linwood Dibbles in Treatment: 3 Vital Signs Time Taken: 12:36 Temperature (F): 97.7 Height (in): 65 Pulse (bpm): [address] ([id number]) [phone].pdf Page 8 of 8 Weight (lbs): 184 Respiratory Rate (breaths/min): 18 Body Mass Index (BMI): 30.6 Blood  Pressure (mmHg): 125/79 Reference Range: 80 - 120 mg / dl Electronic Signature(s) Signed: 11/12/2022 4:40:27 PM By: Midge Aver MSN RN CNS WTA Entered By: Midge Aver on 11/11/2022 12:41:30

## 2022-11-12 NOTE — Progress Notes (Signed)
KAHLILAH, EPP ([id number]) [id number] Page 1 of 5 Visit Report for 11/11/2022 Chief Complaint Document Details Patient Name: Date of Service: Emma Cuevas, Emma Cuevas 11/11/2022 12:30 PM Medical Record Number: [id number] Patient Account Number: [id number] Date of Birth/Sex: Treating RN: 16-Oct-1965 (57 y.o. [name] Primary Care Provider: Kandyce Rud Other Clinician: Referring Provider: Treating Provider/Extender: Linwood Dibbles in Treatment: 3 Information Obtained from: Patient Chief Complaint 10/21/2022; left breast wound Electronic Signature(s) Signed: 11/12/2022 12:49:28 PM By: Geralyn Corwin DO Entered By: Geralyn Corwin on 11/11/2022 13:01:00 -------------------------------------------------------------------------------- HPI Details Patient Name: Date of Service: Emma Dun. 11/11/2022 12:30 PM Medical Record Number: [id number] Patient Account Number: [id number] Date of Birth/Sex: Treating RN: 1965-05-31 (57 y.o. [name] Primary Care Provider: Kandyce Rud Other Clinician: Referring Provider: Treating Provider/Extender: Linwood Dibbles in Treatment: 3 History of Present Illness HPI Description: 10/21/2022 Ms. Emma Cuevas is a 57 year old female with a past medical history of right-sided breast cancer status post chemotherapy and radiation that presents to the clinic for a left breast wound. She was diagnosed in October 2022 with invasive mammary carcinoma and started on chemotherapy for the right breast. In April 2023 she had a mastectomy of the right breast with expander insert by Dr. Karie Schwalbe aylor. Starting in June 2023 she had radiation and completed this on August 2023. She had no issues with wound healing on the right side. Her incision has healed here. In December 2023 she had a left breast reduction and lift. Since her surgery on the left side she has had issues with wound  healing. She had biopsy of this area in February 2024 that showed dense fibrosis and mixed inflammatory cell but no malignancy seen. Due to poor wound healing her oncologist Dr. Smith Robert stopped her chemotherapy. She has tried [employer] Ag and oil emulsion nonadherent dressing for her wound care. She currently denies signs of infection. 6/5; second visit for this patient to had a left breast reduction in December 2023. Some point after this surgery which was closed with derma glue. The patient the wound dehisced and she has had a chronic wound on this ever since. It does not sound like there was ever a significant infection in this area. She had biopsies done of the wound which did not show malignancy. Last week the orders were blast X with Promogran. She received neither 1 of these materials I am not sure what the issue was here. Yesterday she came in and she was given a Physicist, medical. She has been using this. Somebody had mentioned to her last week about making sure her bra did not rub on the wound which was a good suggestion she has bought a new bra Emma Cuevas, Emma Cuevas ([id number]) [phone].pdf Page 2 of 5 6/12; patient presents for follow-up. Eventually blast X made its way to the patient and she has been using this with collagen. She has been using a new bra that has reduced irritation. She has no issues or complaints today. The wound is much smaller today. 6/19; patient presents for follow-up. She has been using blast X and collagen to the wound bed. Her wound is healed. Electronic Signature(s) Signed: 11/12/2022 12:49:28 PM By: Geralyn Corwin DO Entered By: Geralyn Corwin on 11/11/2022 13:08:56 -------------------------------------------------------------------------------- Physical Exam Details Patient Name: Date of Service: QUANTERIA, TEICHMAN 11/11/2022 12:30 PM Medical Record Number: [id number] Patient Account Number: [id number] Date of Birth/Sex: Treating  RN: [birth year] (57 y.o. [name] Primary Care Provider: Kandyce Rud Other Clinician:  Referring Provider: Treating Provider/Extender: Linwood Dibbles in Treatment: 3 Constitutional . Psychiatric . Notes Epithelization to the previous wound site under the left breast Electronic Signature(s) Signed: 11/12/2022 12:49:28 PM By: Geralyn Corwin DO Entered By: Geralyn Corwin on 11/11/2022 13:13:40 -------------------------------------------------------------------------------- Physician Orders Details Patient Name: Date of Service: Emma Dun. 11/11/2022 12:30 PM Medical Record Number: [id number] Patient Account Number: [id number] Date of Birth/Sex: Treating RN: [birth year] (57 y.o. [name] Primary Care Provider: Kandyce Rud Other Clinician: Referring Provider: Treating Provider/Extender: Linwood Dibbles in Treatment: 3 Verbal / Phone Orders: No Diagnosis Coding Discharge From [facility] Discharge from Wound Care Center Treatment Complete - Vaseline at night Emma Cuevas, Emma Cuevas ([id number]) [id number] Page 3 of 5 Electronic Signature(s) Signed: 11/12/2022 12:49:28 PM By: Geralyn Corwin DO Entered By: Geralyn Corwin on 11/11/2022 13:18:26 -------------------------------------------------------------------------------- Problem List Details Patient Name: Date of Service: Emma Dun. 11/11/2022 12:30 PM Medical Record Number: [id number] Patient Account Number: [id number] Date of Birth/Sex: Treating RN: 1965-12-07 (57 y.o. 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 11/11/2022 12:30 PM Emma Dun ([id number]) [id number] Page 4 of 5 Medical Record Number: [id number] Patient Account Number: [id number] Date of Birth/Sex: Treating RN: May 27, 1965 (57 y.o. [name] Primary Care Provider: Kandyce Rud Other Clinician: Referring Provider: Treating Provider/Extender: Linwood Dibbles in Treatment: 3 Subjective Chief Complaint Information obtained from Patient 10/21/2022; left breast wound History of Present Illness (HPI) 10/21/2022 Ms. Emma Cuevas is a 57 year old female with a past medical history of right-sided breast cancer status post chemotherapy and radiation that presents to the clinic for a left breast wound. She was diagnosed in October 2022 with invasive mammary carcinoma and started on chemotherapy for the right breast. In April 2023 she had a mastectomy of the right breast with expander insert by Dr. Karie Schwalbe aylor. Starting in June 2023 she had radiation and completed this on August 2023. She had no issues with wound healing on the right side. Her incision has healed here. In December 2023 she had a left breast reduction and lift. Since her surgery on the left side she has had issues with wound healing. She had biopsy of this area in February 2024 that showed dense fibrosis and  mixed inflammatory cell but no malignancy seen. Due to poor wound healing her oncologist Dr. Smith Robert stopped her chemotherapy. She has tried [employer] Ag and oil emulsion nonadherent dressing for her wound care. She currently denies signs of infection. 6/5; second visit for this patient to had a left breast reduction in December 2023. Some point after this surgery which was closed with derma glue. The patient the wound dehisced and she has had a chronic wound on this ever since. It does not sound like there was ever a significant infection in this area. She had biopsies  done of the wound which did not show malignancy. Last week the orders were blast X with Promogran. She received neither 1 of these materials I am not sure what the issue was here. Yesterday she came in and she was given a Physicist, medical. She has been using this. Somebody had mentioned to her last week about making sure her bra did not rub on the wound which was a good suggestion she has bought a new bra 6/12; patient presents for follow-up. Eventually blast X made its way to the patient and she has been using this with collagen. She has been using a new bra that has reduced irritation. She has no issues or complaints today. The wound is much smaller today. 6/19; patient presents for follow-up. She has been using blast X and collagen to the wound bed. Her wound is healed. Objective Constitutional Vitals Time Taken: 12:36 PM, Height: 65 in, Weight: 184 lbs, BMI: 30.6, Temperature: 97.7 F, Pulse: 81 bpm, Respiratory Rate: 18 breaths/min, Blood Pressure: 125/79 mmHg. General Notes: Epithelization to the previous wound site under the left breast Integumentary (Hair, Skin) Wound #1 status is Healed - Epithelialized. Original cause of wound was Surgical Injury. The date acquired was: 05/01/2022. The wound has been in treatment 3 weeks. The wound is located on the Left Breast. The wound measures 0cm length x 0cm width x 0cm depth; 0cm^2 area  and 0cm^3 volume. There is Fat Layer (Subcutaneous Tissue) exposed. There is a medium amount of serosanguineous drainage noted. There is large (67-100%) red, pink granulation within the wound bed. There is a small (1-33%) amount of necrotic tissue within the wound bed. Assessment Active Problems ICD-10 Unspecified open wound of left breast, initial encounter Non-pressure chronic ulcer of skin of other sites limited to breakdown of skin Disruption of external operation (surgical) wound, not elsewhere classified, initial encounter Malignant neoplasm of unspecified site of right female breast Patient has done well with Lasix and collagen. Her wound is healed. I recommended continuing to limit moisture retention and friction to this area. She can use the thin layer of Vaseline daily and nightly to not allow this to dry or cracked. If the wound were to reopen she knows to give [place] a call. Follow-up as needed. Plan Discharge From [facility]: Emma Cuevas, Emma Cuevas ([id number]) [phone].pdf Page 5 of 5 Discharge from Wound Care Center Treatment Complete - Vaseline at night 1. Discharge from clinic due to closed wound 2. Follow-up as needed Electronic Signature(s) Signed: 11/12/2022 12:49:28 PM By: Geralyn Corwin DO Entered By: Geralyn Corwin on 11/11/2022 13:15:02 -------------------------------------------------------------------------------- SuperBill Details Patient Name: Date of Service: Emma Dun. 11/11/2022 Medical Record Number: [id number] Patient Account Number: [id number] Date of Birth/Sex: Treating RN: 06/01/1965 (57 y.o. [name] Primary Care Provider: Kandyce Rud Other Clinician: Referring Provider: Treating Provider/Extender: Linwood Dibbles in Treatment: 3 Diagnosis Coding ICD-10 Codes Code Description [id number] Unspecified open wound of left breast, initial encounter L98.491 Non-pressure chronic ulcer of skin  of other sites limited to breakdown of skin T81.31XA Disruption of external operation (surgical) wound, not elsewhere classified, initial encounter C50.911 Malignant neoplasm of unspecified site of right female breast Facility Procedures : CPT4 Code: [id number] Description: 99213 - WOUND CARE VISIT-LEV 3 EST PT Modifier: Quantity: 1 Physician Procedures : CPT4 Code Description Modifier [id number] 99213 - WC PHYS LEVEL 3 - EST PT ICD-10 Diagnosis Description S21.002A Unspecified open wound of left breast, initial encounter L98.491 Non-pressure chronic ulcer of skin of other sites limited to  breakdown of  skin T81.31XA Disruption of external operation (surgical) wound, not elsewhere classified, initial encounter C50.911 Malignant neoplasm of unspecified site of right female breast Quantity: 1 Electronic Signature(s) Signed: 11/12/2022 12:49:28 PM By: Geralyn Corwin DO Entered By: Geralyn Corwin on 11/11/2022 13:18:20

## 2022-11-17 ENCOUNTER — Ambulatory Visit: Payer: Managed Care, Other (non HMO) | Admitting: Physician Assistant

## 2022-11-17 ENCOUNTER — Encounter: Payer: Self-pay | Admitting: Physician Assistant

## 2022-11-17 VITALS — BP 133/83 | HR 92 | Ht 65.5 in | Wt 185.4 lb

## 2022-11-17 DIAGNOSIS — S21002A Unspecified open wound of left breast, initial encounter: Secondary | ICD-10-CM | POA: Diagnosis not present

## 2022-11-17 DIAGNOSIS — T8189XD Other complications of procedures, not elsewhere classified, subsequent encounter: Secondary | ICD-10-CM

## 2022-11-17 NOTE — Progress Notes (Signed)
   Referring Provider Kandyce Rud, MD [phone] [address] [facility] [place],  [place] [address]   CC:  Chief Complaint  Patient presents with   Follow-up      Emma Cuevas is an 57 y.o. female.   HPI: This is a 57 year old female seen in our office for follow-up evaluation of left breast wound.  The patient was most recently seen in the office on 11/03/2022.  She is status post breast reconstruction in 2023.  In December she had removal of the expander and placement of a 500 cc high-profile Mentor gel implant with reduction of the left breast.  Since that time she has had breakdown of the left breast predominantly over the vertical limb.  She was last seen on 11/03/2022 and since that time had complete resolution of the wound.  She was being seen at the wound care center.  She notes over the last few days she has noticed a small wound along the vertical limb.  She denies any infectious signs or symptoms.  She has started applying collagen to it as she had this previously.  Review of Systems General: Positive for breast wound  Physical Exam    11/17/2022    8:15 AM 11/03/2022    8:03 AM 10/14/2022    8:13 AM  Vitals with BMI  Height 5' 5.5"  5' 5.5"  Weight 185 lbs 6 oz  180 lbs  BMI 30.37  29.49  Systolic 133 133 109  Diastolic 83 85 80  Pulse 92 94 81    General:  No acute distress,  Alert and oriented, Non-Toxic, Normal speech and affect Very minimal superficial wound along the left vertical limb, chronic skin changes surrounding, no warmth to touch, no discharge, NAC viable.   Assessment/Plan This a 58 year old female seen in our office for follow-up evaluation of left breast wound.  She did have complete resolution of her wound but had a small pinpoint wound opened up over the last several days.  This is superficial, she has started using collagen.  I encouraged her to continue using this and follow-up with wound care as needed.  She may  follow-up in our office on a as needed basis if the wound becomes any larger.  The patient verbalized understanding and agreement to today's plan had no further questions or concerns.  Emma Cuevas Emma Cuevas 11/17/2022, 9:54 AM

## 2022-11-18 ENCOUNTER — Encounter: Payer: Managed Care, Other (non HMO) | Admitting: Internal Medicine

## 2022-11-30 ENCOUNTER — Encounter: Payer: Self-pay | Admitting: Oncology

## 2022-12-12 ENCOUNTER — Ambulatory Visit
Admission: RE | Admit: 2022-12-12 | Discharge: 2022-12-12 | Disposition: A | Discharge status: Home / Self Care | Payer: Managed Care, Other (non HMO) | Attending: Internal Medicine | Admitting: Internal Medicine

## 2022-12-12 ENCOUNTER — Ambulatory Visit
Admission: EM | Admit: 2022-12-12 | Discharge: 2022-12-12 | Disposition: A | Discharge status: Home / Self Care | Payer: Managed Care, Other (non HMO) | Attending: Internal Medicine | Admitting: Internal Medicine

## 2022-12-12 ENCOUNTER — Ambulatory Visit
Admission: RE | Admit: 2022-12-12 | Discharge: 2022-12-12 | Disposition: A | Discharge status: Home / Self Care | Payer: Managed Care, Other (non HMO) | Source: Ambulatory Visit | Attending: Internal Medicine | Admitting: Internal Medicine

## 2022-12-12 DIAGNOSIS — R509 Fever, unspecified: Secondary | ICD-10-CM

## 2022-12-12 DIAGNOSIS — Z1152 Encounter for screening for COVID-19: Secondary | ICD-10-CM | POA: Insufficient documentation

## 2022-12-12 DIAGNOSIS — R051 Acute cough: Secondary | ICD-10-CM

## 2022-12-12 LAB — POCT INFLUENZA A/B
Influenza A, POC: NEGATIVE
Influenza B, POC: NEGATIVE

## 2022-12-12 LAB — POCT URINALYSIS DIP (MANUAL ENTRY)
Glucose, UA: NEGATIVE mg/dL
Leukocytes, UA: NEGATIVE
Nitrite, UA: NEGATIVE
Protein Ur, POC: 100 mg/dL — AB
Spec Grav, UA: >=1.03 — AB (ref 1.010–1.025)
Urobilinogen, UA: 0.2 E.U./dL
pH, UA: 5 (ref 5.0–8.0)

## 2022-12-12 MED ORDER — ACETAMINOPHEN 325 MG PO TABS
975.0000 mg | ORAL_TABLET | Freq: Once | ORAL | Status: AC
  Administered 2022-12-12: 975 mg via ORAL

## 2022-12-12 NOTE — ED Provider Notes (Addendum)
Emma Cuevas    CSN: [id number] Arrival date & time: 12/12/22  1305      History   Chief Complaint Chief Complaint  Patient presents with   Chills   Cough    HPI Emma Cuevas is a 57 y.o. female started with mild cough in the pm 6 days ago. Wed had chills. The next day no fever when she checked. Thursday pm chills again. Yesterday fever has been 99-102 Woke up soaked in sweat. Last done of Tylenol at 3 am.  The cough is still on occasion.  Negative home covid test yesterday.  Denies any tick bites in the past week since she  she went for a walk in th woods.   Wound from L breast reduction has not changed to the worse, has seen wound care when it was not healing this year. She denies any changes on the look of this area. She denies UTI symptoms. Only has a mild HA right now. Denies having body aches or abdominal pain. Denies nose congestion or rhinitis.     Past Medical History:  Diagnosis Date   Anemia    Benign neoplasm of breast 2013   left breast   BRCA negative 2015   Breast cancer (HCC)    Cancer (HCC)    Family history of bladder cancer    Family history of breast cancer    Family history of malignant neoplasm of breast 2013   Family history of pancreatic cancer    Screening for obesity     Patient Active Problem List   Diagnosis Date Noted   Breast wound, left, initial encounter 11/03/2022   Long term use of bisphosphonates 08/03/2022   Breast cancer (HCC) 09/16/2021   Genetic testing 05/06/2021   Family history of pancreatic cancer 04/23/2021   Family history of bladder cancer 04/23/2021   Carcinoma of breast, estrogen and progesterone receptor positive (HCC) 03/25/2021   Goals of care, counseling/discussion 03/25/2021   Menopause 10/05/2016   Mild renal insufficiency 10/05/2016   Benign neoplasm of breast 10/27/2012   Family history of breast cancer 10/27/2012    Past Surgical History:  Procedure Laterality Date   ABDOMINAL  HYSTERECTOMY  2011   partial   BREAST BIOPSY Left 2013   BREAST BIOPSY Right 03/14/2021   Stereo bx-anterior calcs, "coil" clip-path pending   BREAST BIOPSY Right 03/14/2021   stereo bx-calcs, "Ribbon" clip-path pending   breast biopsy Right 03/14/2021   [place] Bx, Axilla, path pending   BREAST BIOPSY Left 04/30/2021   BREAST BIOPSY WITH RADIO FREQUENCY LOCALIZER Right 09/09/2021   RF tag in axilla   BREAST RECONSTRUCTION WITH PLACEMENT OF TISSUE EXPANDER AND FLEX HD (ACELLULAR HYDRATED DERMIS) Right 09/16/2021   Procedure: RIGHT BREAST RECONSTRUCTION WITH PLACEMENT OF TISSUE EXPANDER AND FLEX HD (ACELLULAR HYDRATED DERMIS);  Surgeon: Allena Napoleon, MD;  Location: [facility];  Service: Plastics;  Laterality: Right;   BREAST REDUCTION SURGERY Left 05/01/2022   Procedure: MAMMARY REDUCTION  (BREAST);  Surgeon: Santiago Glad, MD;  Location: [facility];  Service: Plastics;  Laterality: Left;   BREAST SURGERY Left 10/16/2011   left breast finesse biopsy   COLONOSCOPY WITH PROPOFOL N/A 12/05/2014   Procedure: COLONOSCOPY WITH PROPOFOL;  Surgeon: Kieth Brightly, MD;  Location: [facility];  Service: Endoscopy;  Laterality: N/A;   DILATION AND CURETTAGE OF UTERUS     IR CV LINE INJECTION  05/06/2021   IR IMAGING GUIDED PORT INSERTION  05/08/2021  MASTECTOMY Right    PORT-A-CATH REMOVAL  09/16/2021   Procedure: REMOVAL PORT-A-CATH;  Surgeon: Carolan Shiver, MD;  Location: [facility];  Service: General;;   PORTACATH PLACEMENT N/A 04/02/2021   Procedure: INSERTION PORT-A-CATH;  Surgeon: Carolan Shiver, MD;  Location: [facility];  Service: General;  Laterality: N/A;   REMOVAL OF TISSUE EXPANDER AND PLACEMENT OF IMPLANT Right 05/01/2022   Procedure: REMOVAL OF TISSUE EXPANDER AND PLACEMENT OF IMPLANT;  Surgeon: Santiago Glad, MD;  Location: [facility];  Service: Plastics;  Laterality: Right;   SIMPLE MASTECTOMY WITH AXILLARY SENTINEL NODE BIOPSY  Right 09/16/2021   Procedure: SIMPLE MASTECTOMY WITH AXILLARY SENTINEL NODE BIOPSY -- RF tag in axillary;  Surgeon: Carolan Shiver, MD;  Location: [facility];  Service: General;  Laterality: Right;    OB History     Gravida  2   Para      Term      Preterm      AB      Living  2      SAB      IAB      Ectopic      Multiple      Live Births           Obstetric Comments  Age of first pregnancy-21 Age of first menstruation-11 LMP-age 29          Home Medications    Prior to Admission medications   Medication Sig Start Date End Date Taking? Authorizing Provider  acetaminophen (TYLENOL) 500 MG tablet Take 1,000 mg by mouth every 6 (six) hours as needed for moderate pain.    [provider]  anastrozole (ARIMIDEX) 1 MG tablet Take 1 tablet (1 mg total) by mouth daily. 05/22/22   Creig Hines, MD  Calcium Carb-Cholecalciferol 500-10 MG-MCG TABS Take 1 tablet by mouth every morning. 06/02/21   [provider]  diphenoxylate-atropine (LOMOTIL) 2.5-0.025 MG tablet Take 2 tablets by mouth 4 (four) times daily as needed for diarrhea or loose stools. 03/03/22   Creig Hines, MD    Family History Family History  Problem Relation Age of Onset   Breast cancer Mother 79   Bladder Cancer Father 43   Breast cancer Paternal Aunt 64   Pancreatic cancer Paternal Uncle 50   Pancreatic cancer Maternal Grandfather 53   Cancer Cousin        in ear    Social History Social History   Tobacco Use   Smoking status: Never   Smokeless tobacco: Never  Vaping Use   Vaping status: Never Used  Substance Use Topics   Alcohol use: No   Drug use: No     Allergies   Other, Tegaderm ag mesh [silver], and Tape   Review of Systems Review of Systems  As noted in HPI Physical Exam Triage Vital Signs ED Triage Vitals [12/12/22 1403]  Encounter Vitals Group     BP 102/61     Systolic BP Percentile      Diastolic BP Percentile      Pulse Rate (!)  116     Resp 20     Temp (!) 102.6 F (39.2 C)     Temp Source Oral     SpO2 98 %     Weight      Height      Head Circumference      Peak Flow      Pain Score      Pain Loc  Pain Education      Exclude from Growth Chart    No data found.  Updated Vital Signs BP 102/61 (BP Location: Left Arm)   Pulse (!) 116   Temp (!) 100.6 F (38.1 C) (Oral)   Resp 20   SpO2 98%   Visual Acuity Right Eye Distance:   Left Eye Distance:   Bilateral Distance:    Right Eye Near:   Left Eye Near:    Bilateral Near:       Physical Exam Vitals signs and nursing note reviewed.  Constitutional:      General: She is not in acute distress.    Appearance: Normal appearance. She is not ill-appearing, toxic-appearing or diaphoretic.  HENT:     Head: Normocephalic.     Right Ear: Tympanic membrane, ear canal and external ear normal.     Left Ear: Tympanic membrane, ear canal and external ear normal.     Nose: Nose normal.     Mouth/Throat: clear    Mouth: Mucous membranes are moist.  Eyes:     General: No scleral icterus.       Right eye: No discharge.        Left eye: No discharge.     Conjunctiva/sclera: Conjunctivae normal.  Neck:     Musculoskeletal: Neck supple. No neck rigidity.  Cardiovascular:     Rate and Rhythm: Normal rate and regular rhythm.     Heart sounds: No murmur.  Pulmonary:     Effort: Pulmonary effort is normal.     Breath sounds: Normal breath sounds.   Musculoskeletal: Normal range of motion.  Lymphadenopathy:     Cervical: No cervical adenopathy.  Skin:    General: Skin is warm and dry.     Coloration: Skin is not jaundiced.     Findings: has erythematous scar tissue on L lower mid breath which is not open and not hotter than her body. I checked this x 2 separate times. No streaking noted to axilla. This area is not tender Neurological:     Mental Status: She is alert and oriented to person, place, and time.     Gait: Gait normal.  Psychiatric:         Mood and Affect: Mood normal.        Behavior: Behavior normal.        Thought Content: Thought content normal.        Judgment: Judgment normal.   UC Treatments / Results  Labs (all labs ordered are listed, but only abnormal results are displayed) Labs Reviewed  POCT URINALYSIS DIP (MANUAL ENTRY) - Abnormal; Notable for the following components:      Result Value   Bilirubin, UA small (*)    Ketones, POC UA small (15) (*)    Spec Grav, UA >=1.030 (*)    Blood, UA small (*)    Protein Ur, POC =100 (*)    All other components within normal limits  SARS CORONAVIRUS 2 (TAT 6-24 HRS)  POCT INFLUENZA A/B   Flu A&B negative Covid pending EKG   Radiology DG Chest 2 View  Result Date: 12/12/2022 CLINICAL DATA:  Fever.  Cough. EXAM: CHEST - 2 VIEW COMPARISON:  06/02/2021 FINDINGS: Normal heart size. No pleural fluid or interstitial edema. Mild coarsened interstitial marking with chronic bronchial wall thickening. Scar with tenting along the left hemidiaphragm is unchanged. Postoperative changes involving the right breast noted. IMPRESSION: 1. No acute findings. 2. Chronic bronchial wall thickening. Electronically Signed  By: Signa Kell M.D.   On: 12/12/2022 15:33    Procedures Procedures (including critical care time)  Medications Ordered in UC Medications  acetaminophen (TYLENOL) tablet 975 mg (975 mg Oral Given 12/12/22 1433)    Initial Impression / Assessment and Plan / UC Course  I have reviewed the triage vital signs and the nursing notes. She was given Tylenol PO 975 mg while here.  Pertinent labs  results that were available during my care of the patient were reviewed by me and considered in my medical decision making (see chart for details). We could not do a chest xray here since we had a pt suspect with TB and we had to close the xray room for the day. I sent her to [facility] for stat chest xray as out patient.   Febrile illness  She was explained if all the rest  are negative and she was still with fever, to go to ER to have labs done to r/o viral vs bacterial etiology of fever. Advised to hydrate better.  If Covid is positive, she may have Molnupiravir since last eGFR was 51 from March 2024   Final Clinical Impressions(s) / UC Diagnoses   Final diagnoses:  Acute cough  Fever, unspecified     Discharge Instructions      Your flu test was negative and Urine test shows no infection.  Go to ER right now to have a chest xray done, and we will call you when the results are done. We will call you if the covid test is positive, but you may see the results in your Mychart as soon as the lab post it.  Continue with Tylenol for fever.      ED Prescriptions   None    PDMP not reviewed this encounter.   Garey Ham, PA-C 12/12/22 1505    Rodriguez-Southworth, [place], PA-C 12/12/22 1507    Rodriguez-Southworth, [place], PA-C 12/12/22 1632    Rodriguez-Southworth, Nettie Elm, PA-C 12/12/22 [phone]

## 2022-12-12 NOTE — Discharge Instructions (Addendum)
Your flu test was negative and Urine test shows no infection.  Go to ER right now to have a chest xray done, and we will call you when the results are done. We will call you if the covid test is positive, but you may see the results in your Mychart as soon as the lab post it.  Continue with Tylenol for fever.

## 2022-12-12 NOTE — ED Triage Notes (Signed)
Pt states Wednesday night she began having chills, dizziness, and dry cough.  Home interventions: mucinex, tylenol (last taken around 0300)

## 2022-12-13 LAB — SARS CORONAVIRUS 2 (TAT 6-24 HRS): SARS Coronavirus 2: NEGATIVE

## 2022-12-21 ENCOUNTER — Other Ambulatory Visit: Payer: Self-pay | Admitting: Oncology

## 2023-02-16 ENCOUNTER — Inpatient Hospital Stay: Payer: Managed Care, Other (non HMO) | Attending: Radiation Oncology

## 2023-02-16 ENCOUNTER — Encounter: Payer: Self-pay | Admitting: Oncology

## 2023-02-16 ENCOUNTER — Inpatient Hospital Stay (HOSPITAL_BASED_OUTPATIENT_CLINIC_OR_DEPARTMENT_OTHER): Payer: Managed Care, Other (non HMO) | Admitting: Oncology

## 2023-02-16 ENCOUNTER — Inpatient Hospital Stay: Payer: Managed Care, Other (non HMO)

## 2023-02-16 VITALS — BP 117/64 | HR 72 | Temp 97.7°F | Resp 18 | Ht 65.0 in | Wt 180.7 lb

## 2023-02-16 DIAGNOSIS — Z9011 Acquired absence of right breast and nipple: Secondary | ICD-10-CM | POA: Insufficient documentation

## 2023-02-16 DIAGNOSIS — Z803 Family history of malignant neoplasm of breast: Secondary | ICD-10-CM | POA: Insufficient documentation

## 2023-02-16 DIAGNOSIS — Z800 Family history of malignant neoplasm of digestive organs: Secondary | ICD-10-CM | POA: Diagnosis not present

## 2023-02-16 DIAGNOSIS — M25531 Pain in right wrist: Secondary | ICD-10-CM | POA: Diagnosis not present

## 2023-02-16 DIAGNOSIS — C50811 Malignant neoplasm of overlapping sites of right female breast: Secondary | ICD-10-CM | POA: Diagnosis present

## 2023-02-16 DIAGNOSIS — Z853 Personal history of malignant neoplasm of breast: Secondary | ICD-10-CM

## 2023-02-16 DIAGNOSIS — Z79899 Other long term (current) drug therapy: Secondary | ICD-10-CM | POA: Insufficient documentation

## 2023-02-16 DIAGNOSIS — Z8052 Family history of malignant neoplasm of bladder: Secondary | ICD-10-CM | POA: Diagnosis not present

## 2023-02-16 DIAGNOSIS — Z7983 Long term (current) use of bisphosphonates: Secondary | ICD-10-CM | POA: Diagnosis not present

## 2023-02-16 DIAGNOSIS — C50011 Malignant neoplasm of nipple and areola, right female breast: Secondary | ICD-10-CM

## 2023-02-16 DIAGNOSIS — Z808 Family history of malignant neoplasm of other organs or systems: Secondary | ICD-10-CM | POA: Insufficient documentation

## 2023-02-16 DIAGNOSIS — M79645 Pain in left finger(s): Secondary | ICD-10-CM | POA: Insufficient documentation

## 2023-02-16 DIAGNOSIS — Z170 Estrogen receptor positive status [ER+]: Secondary | ICD-10-CM | POA: Diagnosis not present

## 2023-02-16 DIAGNOSIS — Z923 Personal history of irradiation: Secondary | ICD-10-CM | POA: Diagnosis not present

## 2023-02-16 DIAGNOSIS — Z90710 Acquired absence of both cervix and uterus: Secondary | ICD-10-CM | POA: Diagnosis not present

## 2023-02-16 DIAGNOSIS — Z08 Encounter for follow-up examination after completed treatment for malignant neoplasm: Secondary | ICD-10-CM

## 2023-02-16 DIAGNOSIS — Z86018 Personal history of other benign neoplasm: Secondary | ICD-10-CM | POA: Insufficient documentation

## 2023-02-16 DIAGNOSIS — R911 Solitary pulmonary nodule: Secondary | ICD-10-CM | POA: Insufficient documentation

## 2023-02-16 DIAGNOSIS — Z9221 Personal history of antineoplastic chemotherapy: Secondary | ICD-10-CM | POA: Diagnosis not present

## 2023-02-16 DIAGNOSIS — Z79811 Long term (current) use of aromatase inhibitors: Secondary | ICD-10-CM

## 2023-02-16 DIAGNOSIS — R590 Localized enlarged lymph nodes: Secondary | ICD-10-CM | POA: Diagnosis not present

## 2023-02-16 DIAGNOSIS — Z5181 Encounter for therapeutic drug level monitoring: Secondary | ICD-10-CM

## 2023-02-16 LAB — COMPREHENSIVE METABOLIC PANEL
ALT: 25 U/L (ref 0–44)
AST: 27 U/L (ref 15–41)
Albumin: 3.9 g/dL (ref 3.5–5.0)
Alkaline Phosphatase: 57 U/L (ref 38–126)
Anion gap: 6 (ref 5–15)
BUN: 19 mg/dL (ref 6–20)
CO2: 28 mmol/L (ref 22–32)
Calcium: 9.5 mg/dL (ref 8.9–10.3)
Chloride: 104 mmol/L (ref 98–111)
Creatinine, Ser: 0.98 mg/dL (ref 0.44–1.00)
GFR, Estimated: >60 mL/min (ref >60)
Glucose, Bld: 92 mg/dL (ref 70–99)
Potassium: 4.1 mmol/L (ref 3.5–5.1)
Sodium: 138 mmol/L (ref 135–145)
Total Bilirubin: 0.6 mg/dL (ref 0.3–1.2)
Total Protein: 7 g/dL (ref 6.5–8.1)

## 2023-02-16 LAB — CBC WITH DIFFERENTIAL/PLATELET
Abs Immature Granulocytes: 0.01 10*3/uL (ref 0.00–0.07)
Basophils Absolute: 0 10*3/uL (ref 0.0–0.1)
Basophils Relative: 0 %
Eosinophils Absolute: 0.1 10*3/uL (ref 0.0–0.5)
Eosinophils Relative: 3 %
HCT: 34.7 % — ABNORMAL LOW (ref 36.0–46.0)
Hemoglobin: 10.8 g/dL — ABNORMAL LOW (ref 12.0–15.0)
Immature Granulocytes: 0 %
Lymphocytes Relative: 52 %
Lymphs Abs: 2.5 10*3/uL (ref 0.7–4.0)
MCH: 28.1 pg (ref 26.0–34.0)
MCHC: 31.1 g/dL (ref 30.0–36.0)
MCV: 90.1 fL (ref 80.0–100.0)
Monocytes Absolute: 0.5 10*3/uL (ref 0.1–1.0)
Monocytes Relative: 11 %
Neutro Abs: 1.6 10*3/uL — ABNORMAL LOW (ref 1.7–7.7)
Neutrophils Relative %: 34 %
Platelets: 181 10*3/uL (ref 150–400)
RBC: 3.85 MIL/uL — ABNORMAL LOW (ref 3.87–5.11)
RDW: 15.9 % — ABNORMAL HIGH (ref 11.5–15.5)
WBC: 4.8 10*3/uL (ref 4.0–10.5)
nRBC: 0 % (ref 0.0–0.2)

## 2023-02-16 MED ORDER — ZOLEDRONIC ACID 4 MG/100ML IV SOLN
4.0000 mg | INTRAVENOUS | Status: DC
  Administered 2023-02-16: 4 mg via INTRAVENOUS
  Filled 2023-02-16: qty 100

## 2023-02-16 MED ORDER — SODIUM CHLORIDE 0.9 % IV SOLN
Freq: Once | INTRAVENOUS | Status: AC
  Filled 2023-02-16: qty 250

## 2023-02-16 MED ORDER — EXEMESTANE 25 MG PO TABS: 25.0000 mg | ORAL_TABLET | Freq: Every day | ORAL | 3 refills | Status: AC

## 2023-02-16 NOTE — Progress Notes (Signed)
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  This was followed by diagnostic mammogram and ultrasound.  Mammogram showed extensive suspicious pleomorphic calcifications measuring up to 6.8 cm.  2 abnormal lymph nodes were seen in the right axilla on ultrasound as well.  Both the calcifications and the lymph node was biopsied.  Anterior and posterior end of the calcifications was consistent with invasive mammary carcinoma grade 3.  Lymph node was positive for metastatic carcinoma as well.  ER 91 to 100% positive PR 41 to 50% positive and HER2 negative.  Ki-67 40%   MRI bilateral breasts showed non-mass enhancement involving the upper outer lower outer and upper inner quadrants finding 10.9 x 8.4 x 6.8 cm.  Non-mass enhancement in the left breast spanning 1.6 x 1.3 x 0.8 cm.  6 metastatic right axillary lymph nodes.   CT chest abdomen and pelvis with contrast showed mild right axillary adenopathy compatible with metastatic disease but no evidence of distant metastatic disease.  5 mm left lower lobe nodule.  Bone scan was negative for metastatic  disease. Patient completed neoadjuvant chemotherapy with dose dense AC followed by Taxol.  She underwent right mastectomy with immediate reconstruction on 09/16/2021.     Final pathology showedMultiple foci of invasive mammary carcinoma the largest focus being 3 mm with extensive lymphovascular invasion.  Tumor invading this dermis of the skin without skin ulceration.  1 micrometastatic carcinoma in the lymph node and 1 out of 8 metastatic carcinoma in the nonsentinel lymph nodes.  Margins negative.  Arimidex started in September 2023.She is also receiving adjuvant Zometa.  Verzenio started on 02/02/2022.  However patient had issues with left breast mastopexy wound healing and therefore Verzenio was discontinued in January 2024.   Patient completed adjuvant radiation therapy.  she is on adjuvant zometa  Interval history-continues to have issues with on and off wound healing involving left breast skin following mastopexy.  She is concerned about going through a mammogram at this time since any kind of pressure in that area can potentially lead to open wound again.  She is following up with wound care team as well.  Appetite and weight have remained normal.  She denies any breast concerns at this time she has also had issues with right wrist swelling as well as pain with movement of her left thumb.  She did not have these issues when she was not on endocrine therapy.  ECOG PS- 1 Pain scale- 0   Review of systems- Review of Systems  Constitutional:  Negative for chills, fever, malaise/fatigue and weight loss.  HENT:  Negative for congestion, ear discharge and nosebleeds.   Eyes:  Negative for blurred vision.  Respiratory:  Negative for cough, hemoptysis, sputum production, shortness of breath  and wheezing.   Cardiovascular:  Negative for chest pain, palpitations, orthopnea and claudication.  Gastrointestinal:  Negative for abdominal pain, blood in stool, constipation, diarrhea, heartburn, melena,  nausea and vomiting.  Genitourinary:  Negative for dysuria, flank pain, frequency, hematuria and urgency.  Musculoskeletal:  Positive for joint pain (right wrist pain). Negative for back pain and myalgias.  Skin:  Negative for rash.  Neurological:  Negative for dizziness, tingling, focal weakness, seizures, weakness and headaches.  Endo/Heme/Allergies:  Does not bruise/bleed easily.  Psychiatric/Behavioral:  Negative for depression and suicidal ideas. The patient does not have insomnia.       Allergies  Allergen Reactions   Other Rash    DERMABOND SKIN GLUE- localized skin reaction; severe skin rash and blistering   Tegaderm Ag Mesh [Silver] Rash    Itchy/buring tiny red bump   Tape     Redness and burning     Past Medical History:  Diagnosis Date   Anemia    Benign neoplasm of breast 2013   left breast   BRCA negative 2015   Breast cancer (HCC)    Cancer (HCC)    Family history of bladder cancer    Family history of breast cancer    Family history of malignant neoplasm of breast 2013   Family history of pancreatic cancer    Screening for obesity      Past Surgical History:  Procedure Laterality Date   ABDOMINAL HYSTERECTOMY  2011   partial   BREAST BIOPSY Left 2013   BREAST BIOPSY Right 03/14/2021   Stereo bx-anterior calcs, "coil" clip-path pending   BREAST BIOPSY Right 03/14/2021   stereo bx-calcs, "Ribbon" clip-path pending   breast biopsy Right 03/14/2021   [place] Bx, Axilla, path pending   BREAST BIOPSY Left 04/30/2021   BREAST BIOPSY WITH RADIO FREQUENCY LOCALIZER Right 09/09/2021   RF tag in axilla   BREAST RECONSTRUCTION WITH PLACEMENT OF TISSUE EXPANDER AND FLEX HD (ACELLULAR HYDRATED DERMIS) Right 09/16/2021   Procedure: RIGHT BREAST RECONSTRUCTION WITH PLACEMENT OF TISSUE EXPANDER AND FLEX HD (ACELLULAR HYDRATED DERMIS);  Surgeon: Allena Napoleon, MD;  Location: [facility];  Service: Plastics;  Laterality: Right;   BREAST REDUCTION SURGERY Left 05/01/2022    Procedure: MAMMARY REDUCTION  (BREAST);  Surgeon: Santiago Glad, MD;  Location: [facility];  Service: Plastics;  Laterality: Left;   BREAST SURGERY Left 10/16/2011   left breast finesse biopsy   COLONOSCOPY WITH PROPOFOL N/A 12/05/2014   Procedure: COLONOSCOPY WITH PROPOFOL;  Surgeon: Kieth Brightly, MD;  Location: [facility];  Service: Endoscopy;  Laterality: N/A;   DILATION AND CURETTAGE OF UTERUS     IR CV LINE INJECTION  05/06/2021   IR IMAGING GUIDED PORT INSERTION  05/08/2021   MASTECTOMY Right    PORT-A-CATH REMOVAL  09/16/2021   Procedure: REMOVAL PORT-A-CATH;  Surgeon: Carolan Shiver, MD;  Location: [facility];  Service: General;;   PORTACATH PLACEMENT N/A 04/02/2021   Procedure: INSERTION PORT-A-CATH;  Surgeon: Carolan Shiver, MD;  Location: [facility];  Service: General;  Laterality: N/A;   REMOVAL OF TISSUE EXPANDER AND PLACEMENT OF IMPLANT Right 05/01/2022   Procedure: REMOVAL OF TISSUE EXPANDER AND PLACEMENT OF IMPLANT;  Surgeon: Santiago Glad, MD;  Location: [facility];  Service: Plastics;  Laterality: Right;   SIMPLE MASTECTOMY WITH AXILLARY SENTINEL NODE BIOPSY Right 09/16/2021   Procedure: SIMPLE MASTECTOMY WITH AXILLARY SENTINEL NODE BIOPSY -- RF tag in axillary;  Surgeon: Carolan Shiver, MD;  Location:  [facility];  Service: General;  Laterality: Right;    Social History   Socioeconomic History   Marital status: Married    Spouse name: Casimiro Needle   Number of children: Not on file   Years of education: Not on file   Highest education level: Not on file  Occupational History   Not on file  Tobacco Use   Smoking status: Never   Smokeless tobacco: Never  Vaping Use   Vaping status: Never Used  Substance and Sexual Activity   Alcohol use: No   Drug use: No   Sexual activity: Not on file  Other Topics Concern   Not on file  Social History Narrative   Married lives at home with husband   Social  Determinants of Health   Financial Resource Strain: Low Risk  (02/16/2022)   Received from [facility]   Overall Financial Resource Strain (CARDIA)  Food Insecurity: No Food Insecurity (02/16/2022)   Received from [facility], [facility]   Hunger Vital Sign    Worried About Running Out of Food in the Last Year: Never true    Ran Out of Food in the Last Year: Never true  Transportation Needs: No Transportation Needs (02/16/2022)   Received from [facility]   PRAPARE - Transportation  Physical Activity: Not on file  Stress: Not on file  Social Connections: Not on file  Intimate Partner Violence: Not on file    Family History  Problem Relation Age of Onset   Breast cancer Mother 12   Bladder Cancer Father 52   Breast cancer Paternal Aunt 92   Pancreatic cancer Paternal Uncle 51   Pancreatic cancer Maternal Grandfather 87   Cancer Cousin        in ear     Current Outpatient Medications:    acetaminophen (TYLENOL) 500 MG tablet, Take 1,000 mg by mouth every 6 (six) hours as needed for moderate pain., Disp: , Rfl:    Calcium Carb-Cholecalciferol 500-10 MG-MCG TABS, Take 1 tablet by mouth every morning., Disp: , Rfl:    exemestane (AROMASIN) 25 MG tablet, Take 1 tablet (25 mg total) by mouth daily after breakfast., Disp: 30 tablet, Rfl: 3   diphenoxylate-atropine (LOMOTIL) 2.5-0.025 MG tablet, Take 2 tablets by mouth 4 (four) times daily as needed for diarrhea or loose stools. (Patient not taking: Reported on 02/16/2023), Disp: 60 tablet, Rfl: 0  Physical exam:  Vitals:   02/16/23 1121  BP: 117/64  Pulse: 72  Resp: 18  Temp: 97.7 F (36.5 C)  TempSrc: Tympanic  SpO2: 100%  Weight: 180 lb 11.2 oz (82 kg)  Height: 5\' 5"  (1.651 m)   Physical Exam Cardiovascular:     Rate and Rhythm: Normal rate and regular rhythm.     Heart sounds: Normal heart sounds.  Pulmonary:     Effort: Pulmonary effort is normal.      Breath sounds: Normal breath sounds.  Abdominal:     General: Bowel sounds are normal.     Palpations: Abdomen is soft.  Skin:    General: Skin is warm and dry.  Neurological:     Mental Status: She is alert and oriented to person, place, and time.    Breast exam: Patient is s/p left mastectomy with reconstruction.  No evidence of chest wall recurrence.  Implant edges are intact.  No palpable bilateral axillary adenopathy.  There is superficial erythema involving the inferior aspect of the left breast from  prior wound healing issues.     Latest Ref Rng & Units 02/16/2023   10:47 AM  CMP  Glucose 70 - 99 mg/dL 92   BUN 6 - 20 mg/dL 19   Creatinine 7.84 - 1.00 mg/dL 6.96   Sodium 295 - 284 mmol/L 138   Potassium 3.5 - 5.1 mmol/L 4.1   Chloride 98 - 111 mmol/L 104   CO2 22 - 32 mmol/L 28   Calcium 8.9 - 10.3 mg/dL 9.5   Total Protein 6.5 - 8.1 g/dL 7.0   Total Bilirubin 0.3 - 1.2 mg/dL 0.6   Alkaline Phos 38 - 126 U/L 57   AST 15 - 41 U/L 27   ALT 0 - 44 U/L 25       Latest Ref Rng & Units 02/16/2023   10:47 AM  CBC  WBC 4.0 - 10.5 K/uL 4.8   Hemoglobin 12.0 - 15.0 g/dL 13.2   Hematocrit 44.0 - 46.0 % 34.7   Platelets 150 - 400 K/uL 181     No images are attached to the encounter.  No results found.   Assessment and plan- Patient is a 57 y.o. female   with pathological prognostic stage I invasive mammary carcinoma mpT1 apN1 acM0 ER/PR positive HER2 negative.  This is a routine follow-up visit for breast cancer  Patient reports more pain in her right wrist as well as left thumb over the last couple of months.  Have asked her to hold off on taking Arimidex and we will switch her to an alternative aromatase inhibitor like Aromasin.  It is very important that patient stays on endocrine therapy as she had high risk ER/PR positive breast cancer requiring chemotherapy and was not able to continue adjuvant Verzenio due to wound healing issues.  Patient is on adjuvant Zometa  every 6 months and will receive her third dose today.  Calcium levels acceptable to proceed with Zometa.  Patient is due for a mammogram at this time but is concerned about proceeding with mammogram given her left breast wound healing issues.  She had anUpon breast MRI in May 2024 which showed changes of fat necrosis involving the left breast and a bilateral breast MRI was recommended in 6 months.  She will reach out to Dr. Maia Plan regarding her mammogram but I will go ahead and proceed with bilateral breast MRI for November 2024.  I will see her back in 6 months with labs to receive Zometa   Visit Diagnosis 1. Encounter for follow-up surveillance of breast cancer   2. Visit for monitoring Arimidex therapy   3. Encounter for monitoring zoledronate therapy      Dr. Owens Shark, MD, MPH [facility] [phone] 02/16/2023 9:00 PM

## 2023-04-29 ENCOUNTER — Encounter: Payer: Self-pay | Admitting: Oncology

## 2023-04-29 NOTE — Telephone Encounter (Signed)
MRI was ordered a while ago but not scheduled. Please schedule before the end of the year. Thank you

## 2023-05-05 ENCOUNTER — Ambulatory Visit
Admission: RE | Admit: 2023-05-05 | Discharge: 2023-05-05 | Disposition: A | Discharge status: Home / Self Care | Payer: Managed Care, Other (non HMO) | Source: Ambulatory Visit | Attending: Oncology | Admitting: Oncology

## 2023-05-05 ENCOUNTER — Ambulatory Visit: Payer: Managed Care, Other (non HMO) | Admitting: Dermatology

## 2023-05-05 ENCOUNTER — Encounter: Payer: Self-pay | Admitting: Dermatology

## 2023-05-05 DIAGNOSIS — L570 Actinic keratosis: Secondary | ICD-10-CM | POA: Diagnosis not present

## 2023-05-05 DIAGNOSIS — D229 Melanocytic nevi, unspecified: Secondary | ICD-10-CM

## 2023-05-05 DIAGNOSIS — Z853 Personal history of malignant neoplasm of breast: Secondary | ICD-10-CM | POA: Insufficient documentation

## 2023-05-05 DIAGNOSIS — Z1283 Encounter for screening for malignant neoplasm of skin: Secondary | ICD-10-CM | POA: Diagnosis not present

## 2023-05-05 DIAGNOSIS — L814 Other melanin hyperpigmentation: Secondary | ICD-10-CM

## 2023-05-05 DIAGNOSIS — Z7189 Other specified counseling: Secondary | ICD-10-CM

## 2023-05-05 DIAGNOSIS — L719 Rosacea, unspecified: Secondary | ICD-10-CM

## 2023-05-05 DIAGNOSIS — D1801 Hemangioma of skin and subcutaneous tissue: Secondary | ICD-10-CM

## 2023-05-05 DIAGNOSIS — L578 Other skin changes due to chronic exposure to nonionizing radiation: Secondary | ICD-10-CM

## 2023-05-05 DIAGNOSIS — Z08 Encounter for follow-up examination after completed treatment for malignant neoplasm: Secondary | ICD-10-CM | POA: Insufficient documentation

## 2023-05-05 DIAGNOSIS — L905 Scar conditions and fibrosis of skin: Secondary | ICD-10-CM

## 2023-05-05 DIAGNOSIS — L821 Other seborrheic keratosis: Secondary | ICD-10-CM

## 2023-05-05 DIAGNOSIS — W908XXA Exposure to other nonionizing radiation, initial encounter: Secondary | ICD-10-CM | POA: Diagnosis not present

## 2023-05-05 MED ORDER — GADOBUTROL 1 MMOL/ML IV SOLN
10.0000 mL | Freq: Once | INTRAVENOUS | Status: AC | PRN
  Administered 2023-05-05: 10 mL via INTRAVENOUS

## 2023-05-05 NOTE — Patient Instructions (Addendum)
Cryotherapy Aftercare  Wash gently with soap and water everyday.   Apply Vaseline and Band-Aid daily until healed.   Actinic Keratosis  What is an actinic keratosis? An actinic keratosis (plural: actinic keratoses) is growth on the surface of the skin that usually appears as a red, hard, crusty or scaly bump.   What causes actinic keratoses? Repeated prolonged sun exposure causes skin damage, especially in fair-skinned persons. Sun-damaged skin becomes dry and wrinkled and may form rough, scaly spots called actinic keratoses. These rough spots remain on the skin even though the crust or scale on top is picked off.   Why treat actinic keratoses? Actinic keratoses are not skin cancers, but because they may sometimes turn cancerous they are called "pre-cancerous". Not all will turn to skin cancer, and it usually takes several years for this to happen. Because it is much easier to treat an actinic keratosis then it is to remove a skin cancer, actinic keratoses should be treated to prevent future skin cancer.   How are actinic keratoses treated? The most common way of treating actinic keratoses is to freeze them with liquid nitrogen. Freezing causes scabbing and shedding of the sun-damaged skin. Healing after a removal usually takes two weeks, depending on the size and location of the keratosis. Hands and legs heal more slowly than the face. The skin's final appearance is usually excellent. There are several topical medications that can be used to treat actinic keratoses. These medications generally have side effects of redness, crusting, and pain. Some are used for a few days, and some for several months before the actinic keratosis is completely gone. Photodynamic therapy is another alternative to freezing actinic keratoses. This treatment is done in a physician's office. A medication is applied to the area of skin with actinic keratoses, and it is allowed to soak in for one or more hours. A special  light is then applied to the skin. Side effects include redness, burning, and peeling.  How can you prevent actinic keratoses? Protection from the sun is the best way to prevent actinic keratoses. The use of proper clothing and sunscreens can prevent the sun damage that leads to an actinic keratosis.  Unfortunately, some sun damage is permanent. Once sun damage has progressed to the point where actinic keratoses develop, new keratoses may appear even without further sun exposure. However, even in skin that is already heavily sun damaged, good sun protection can help reduce the number of actinic keratoses that will appear.       Due to recent changes in healthcare laws, you may see results of your pathology and/or laboratory studies on MyChart before the doctors have had a chance to review them. We understand that in some cases there may be results that are confusing or concerning to you. Please understand that not all results are received at the same time and often the doctors may need to interpret multiple results in order to provide you with the best plan of care or course of treatment. Therefore, we ask that you please give [place] 2 business days to thoroughly review all your results before contacting the office for clarification. Should we see a critical lab result, you will be contacted sooner.   If You Need Anything After Your Visit  If you have any questions or concerns for your doctor, please call our main line at [phone] and press option 4 to reach your doctor's medical assistant. If no one answers, please leave a voicemail as directed and we will  return your call as soon as possible. Messages left after 4 pm will be answered the following business day.   You may also send [place] a message via MyChart. We typically respond to MyChart messages within 1-2 business days.  For prescription refills, please ask your pharmacy to contact our office. Our fax number is [phone].  If you have an  urgent issue when the clinic is closed that cannot wait until the next business day, you can page your doctor at the number below.    Please note that while we do our best to be available for urgent issues outside of office hours, we are not available 24/7.   If you have an urgent issue and are unable to reach [place], you may choose to seek medical care at your doctor's office, retail clinic, urgent care center, or emergency room.  If you have a medical emergency, please immediately call 911 or go to the emergency department.  Pager Numbers  - Dr. Gwen Pounds: [phone]  - Dr. Roseanne Reno: [phone]  - Dr. Katrinka Blazing: [phone]   In the event of inclement weather, please call our main line at [phone] for an update on the status of any delays or closures.  Dermatology Medication Tips: Please keep the boxes that topical medications come in in order to help keep track of the instructions about where and how to use these. Pharmacies typically print the medication instructions only on the boxes and not directly on the medication tubes.   If your medication is too expensive, please contact our office at [phone] option 4 or send [place] a message through MyChart.   We are unable to tell what your co-pay for medications will be in advance as this is different depending on your insurance coverage. However, we may be able to find a substitute medication at lower cost or fill out paperwork to get insurance to cover a needed medication.   If a prior authorization is required to get your medication covered by your insurance company, please allow [place] 1-2 business days to complete this process.  Drug prices often vary depending on where the prescription is filled and some pharmacies may offer cheaper prices.  The website www.goodrx.com contains coupons for medications through different pharmacies. The prices here do not account for what the cost may be with help from insurance (it may be cheaper with your  insurance), but the website can give you the price if you did not use any insurance.  - You can print the associated coupon and take it with your prescription to the pharmacy.  - You may also stop by our office during regular business hours and pick up a GoodRx coupon card.  - If you need your prescription sent electronically to a different pharmacy, notify our office through [facility] or by phone at [phone] option 4.     Si Usted Necesita Algo Despus de Su Visita  Tambin puede enviarnos un mensaje a travs de Clinical cytogeneticist. Por lo general respondemos a los mensajes de MyChart en el transcurso de 1 a 2 das hbiles.  Para renovar recetas, por favor pida a su farmacia que se ponga en contacto con nuestra oficina. Annie Sable de fax es [place] [phone].  Si tiene un asunto urgente cuando la clnica est cerrada y que no puede esperar hasta el siguiente da hbil, puede llamar/localizar a su doctor(a) al nmero que aparece a continuacin.   Por favor, tenga en cuenta que aunque hacemos todo lo posible para estar disponibles para  asuntos urgentes fuera del horario de [place], no estamos disponibles las 24 horas del da, los 7 [address]  [address] de la [place].   Si tiene un problema urgente y no puede comunicarse con nosotros, puede optar por buscar atencin mdica  en el consultorio de su doctor(a), en una clnica privada, en un centro de atencin urgente o en una sala de emergencias.  Si tiene Engineer, drilling, por favor llame inmediatamente al 911 o vaya a la sala de emergencias.  Nmeros de bper  - Dr. Gwen Pounds: [phone]  - Dra. Roseanne Reno: [phone]  - Dr. Katrinka Blazing: [phone]   En caso de inclemencias del tiempo, por favor llame a Lacy Duverney principal al [phone] para una actualizacin sobre el [place] de cualquier retraso o cierre.  Consejos para la medicacin en dermatologa: Por favor, guarde las cajas en las que vienen los medicamentos de uso tpico para ayudarle a  seguir las instrucciones sobre dnde y cmo usarlos. Las farmacias generalmente imprimen las instrucciones del medicamento slo en las cajas y no directamente en los tubos del [place].   Si su medicamento es muy caro, por favor, pngase en contacto con Rolm Gala llamando al [phone] y presione la opcin 4 o envenos un mensaje a travs de Clinical cytogeneticist.   No podemos decirle cul ser su copago por los medicamentos por adelantado ya que esto es diferente dependiendo de la cobertura de su seguro. Sin embargo, es posible que podamos encontrar un medicamento sustituto a Audiological scientist un formulario para que el seguro cubra el medicamento que se considera necesario.   Si se requiere una autorizacin previa para que su compaa de seguros [place] su medicamento, por favor permtanos de 1 a 2 das hbiles para completar [address]  Los precios de los medicamentos varan con frecuencia dependiendo del Environmental consultant de dnde se surte la receta y alguna farmacias pueden ofrecer precios ms baratos.  El sitio web www.goodrx.com tiene cupones para medicamentos de Health and safety inspector. Los precios aqu no tienen en cuenta lo que podra costar con la ayuda del seguro (puede ser ms barato con su seguro), pero el sitio web puede darle el precio si no utiliz Tourist information centre manager.  - Puede imprimir el cupn correspondiente y llevarlo con su receta a la farmacia.  - Tambin puede pasar por nuestra oficina durante el horario de atencin regular y Education officer, museum una tarjeta de cupones de GoodRx.  - Si necesita que su receta se enve electrnicamente a una farmacia diferente, informe a nuestra oficina a travs de MyChart de [place] o por telfono llamando al [phone] y presione la opcin 4.

## 2023-05-05 NOTE — Progress Notes (Signed)
Follow-Up Visit   Subjective  Emma Cuevas is a 57 y.o. female who presents for the following: Skin Cancer Screening and Full Body Skin Exam, Rosacea face, pt has not been using SM Triple Cream  The patient presents for Total-Body Skin Exam (TBSE) for skin cancer screening and mole check. The patient has spots, moles and lesions to be evaluated, some may be new or changing and the patient may have concern these could be cancer.    The following portions of the chart were reviewed this encounter and updated as appropriate: medications, allergies, medical history  Review of Systems:  No other skin or systemic complaints except as noted in HPI or Assessment and Plan.  Objective  Well appearing patient in no apparent distress; mood and affect are within normal limits.  A full examination was performed including scalp, head, eyes, ears, nose, lips, neck, chest, axillae, abdomen, back, buttocks, bilateral upper extremities, bilateral lower extremities, hands, feet, fingers, toes, fingernails, and toenails. All findings within normal limits unless otherwise noted below.   Relevant physical exam findings are noted in the Assessment and Plan.  R nose x 1 Pink scaly macules   Assessment & Plan   SKIN CANCER SCREENING PERFORMED TODAY.  ACTINIC DAMAGE - Chronic condition, secondary to cumulative UV/sun exposure - diffuse scaly erythematous macules with underlying dyspigmentation - Recommend daily broad spectrum sunscreen SPF 30+ to sun-exposed areas, reapply every 2 hours as needed.  - Staying in the shade or wearing long sleeves, sun glasses (UVA+UVB protection) and wide brim hats (4-inch brim around the entire circumference of the hat) are also recommended for sun protection.  - Call for new or changing lesions.  LENTIGINES, SEBORRHEIC KERATOSES, HEMANGIOMAS - Benign normal skin lesions - Benign-appearing - Call for any changes  MELANOCYTIC NEVI - Tan-brown and/or  pink-flesh-colored symmetric macules and papules - Benign appearing on exam today - Observation - Call clinic for new or changing moles - Recommend daily use of broad spectrum spf 30+ sunscreen to sun-exposed areas.   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 Currently well-controlled.   Rosacea is a chronic progressive skin condition usually affecting the face of adults, causing redness and/or acne bumps. It is treatable but not curable. It sometimes affects the eyes (ocular rosacea) as well. It may respond to topical and/or systemic medication and can flare with stress, sun exposure, alcohol, exercise, topical steroids (including hydrocortisone/cortisone 10) and some foods.  Daily application of broad spectrum spf 30+ sunscreen to face is recommended to reduce flares.  Patient denies grittiness of the eyes  Treatment Plan No treatment at this time, if flares may restart SM Triple cream   SCAR L breast, secondary to surgery Exam: Dyspigmented smooth macule or patch. Benign-appearing.  Observation.  Call clinic for new or changing lesions. Recommend daily broad spectrum sunscreen SPF 30+, reapply every 2 hours as needed. Treatment: Recommend Serica moisturizing scar formula cream every night or [facility] brand or Mederma silicone scar sheet every night for the first year after a scar appears to help with scar remodeling if desired. Scars remodel on their own for a full year and will gradually improve in appearance over time.  Counseling for BBL / IPL / Laser and Coordination of Care Discussed the treatment option of Broad Band Light (BBL) Windell Moulding Pulsed Light (  IPL)/ Laser for skin discoloration, including brown spots and redness.  Typically we recommend at least 1-3 treatment sessions  about 5-8 weeks apart for best results.  Cannot have tanned skin when BBL performed, and regular use of sunscreen/photoprotection is advised after the procedure to help maintain results. The patient's condition may also require "maintenance treatments" in the future.  The fee for BBL / laser treatments is $350 per treatment session for the whole face.  A fee can be quoted for other parts of the body.  Insurance typically does not pay for BBL/laser treatments and therefore the fee is an out-of-pocket cost.      AK (ACTINIC KERATOSIS) R nose x 1 Actinic keratoses are precancerous spots that appear secondary to cumulative UV radiation exposure/sun exposure over time. They are chronic with expected duration over 1 year. A portion of actinic keratoses will progress to squamous cell carcinoma of the skin. It is not possible to reliably predict which spots will progress to skin cancer and so treatment is recommended to prevent development of skin cancer.  Recommend daily broad spectrum sunscreen SPF 30+ to sun-exposed areas, reapply every 2 hours as needed.  Recommend staying in the shade or wearing long sleeves, sun glasses (UVA+UVB protection) and wide brim hats (4-inch brim around the entire circumference of the hat). Call for new or changing lesions. Destruction of lesion - R nose x 1 Complexity: simple   Destruction method: cryotherapy   Informed consent: discussed and consent obtained   Timeout:  patient name, date of birth, surgical site, and procedure verified Lesion destroyed using liquid nitrogen: Yes   Region frozen until ice ball extended beyond lesion: Yes   Outcome: patient tolerated procedure well with no complications   Post-procedure details: wound care instructions given    Return in about 1 year (around 05/04/2024) for 1-2 yrs, TBSE, Hx of AKs.  I, Ardis Rowan, RMA, am acting as scribe for Armida Sans, MD .   Documentation: I have reviewed the above documentation for  accuracy and completeness, and I agree with the above.  Armida Sans, MD

## 2023-05-08 ENCOUNTER — Encounter: Payer: Self-pay | Admitting: Dermatology

## 2023-05-10 ENCOUNTER — Encounter: Payer: Self-pay | Admitting: Oncology

## 2023-05-10 ENCOUNTER — Other Ambulatory Visit: Payer: Self-pay

## 2023-05-10 MED ORDER — ANASTROZOLE 1 MG PO TABS: 1.0000 mg | ORAL_TABLET | Freq: Every day | ORAL | 2 refills | Status: DC

## 2023-08-12 ENCOUNTER — Other Ambulatory Visit: Payer: Self-pay

## 2023-08-12 DIAGNOSIS — Z08 Encounter for follow-up examination after completed treatment for malignant neoplasm: Secondary | ICD-10-CM

## 2023-08-16 ENCOUNTER — Inpatient Hospital Stay: Payer: Managed Care, Other (non HMO)

## 2023-08-16 ENCOUNTER — Encounter: Payer: Self-pay | Admitting: Oncology

## 2023-08-16 ENCOUNTER — Inpatient Hospital Stay: Payer: Managed Care, Other (non HMO) | Attending: Oncology

## 2023-08-16 ENCOUNTER — Inpatient Hospital Stay: Payer: Managed Care, Other (non HMO) | Admitting: Oncology

## 2023-08-16 ENCOUNTER — Other Ambulatory Visit: Payer: Self-pay

## 2023-08-16 VITALS — BP 117/63 | HR 79 | Temp 99.1°F | Resp 16 | Wt 186.0 lb

## 2023-08-16 VITALS — BP 120/59 | HR 74 | Temp 96.5°F | Resp 19

## 2023-08-16 DIAGNOSIS — Z170 Estrogen receptor positive status [ER+]: Secondary | ICD-10-CM | POA: Diagnosis not present

## 2023-08-16 DIAGNOSIS — Z79811 Long term (current) use of aromatase inhibitors: Secondary | ICD-10-CM | POA: Diagnosis not present

## 2023-08-16 DIAGNOSIS — Z800 Family history of malignant neoplasm of digestive organs: Secondary | ICD-10-CM | POA: Insufficient documentation

## 2023-08-16 DIAGNOSIS — M79602 Pain in left arm: Secondary | ICD-10-CM | POA: Insufficient documentation

## 2023-08-16 DIAGNOSIS — Z79899 Other long term (current) drug therapy: Secondary | ICD-10-CM | POA: Diagnosis not present

## 2023-08-16 DIAGNOSIS — C7802 Secondary malignant neoplasm of left lung: Secondary | ICD-10-CM | POA: Insufficient documentation

## 2023-08-16 DIAGNOSIS — C773 Secondary and unspecified malignant neoplasm of axilla and upper limb lymph nodes: Secondary | ICD-10-CM | POA: Insufficient documentation

## 2023-08-16 DIAGNOSIS — Z08 Encounter for follow-up examination after completed treatment for malignant neoplasm: Secondary | ICD-10-CM

## 2023-08-16 DIAGNOSIS — Z1721 Progesterone receptor positive status: Secondary | ICD-10-CM | POA: Diagnosis not present

## 2023-08-16 DIAGNOSIS — Z853 Personal history of malignant neoplasm of breast: Secondary | ICD-10-CM | POA: Diagnosis not present

## 2023-08-16 DIAGNOSIS — C50011 Malignant neoplasm of nipple and areola, right female breast: Secondary | ICD-10-CM

## 2023-08-16 DIAGNOSIS — C50812 Malignant neoplasm of overlapping sites of left female breast: Secondary | ICD-10-CM | POA: Insufficient documentation

## 2023-08-16 DIAGNOSIS — Z86018 Personal history of other benign neoplasm: Secondary | ICD-10-CM | POA: Diagnosis not present

## 2023-08-16 DIAGNOSIS — Z923 Personal history of irradiation: Secondary | ICD-10-CM | POA: Diagnosis not present

## 2023-08-16 DIAGNOSIS — Z803 Family history of malignant neoplasm of breast: Secondary | ICD-10-CM | POA: Diagnosis not present

## 2023-08-16 DIAGNOSIS — Z1732 Human epidermal growth factor receptor 2 negative status: Secondary | ICD-10-CM | POA: Insufficient documentation

## 2023-08-16 DIAGNOSIS — Z90710 Acquired absence of both cervix and uterus: Secondary | ICD-10-CM | POA: Diagnosis not present

## 2023-08-16 DIAGNOSIS — Z5181 Encounter for therapeutic drug level monitoring: Secondary | ICD-10-CM

## 2023-08-16 DIAGNOSIS — Z8051 Family history of malignant neoplasm of kidney: Secondary | ICD-10-CM | POA: Insufficient documentation

## 2023-08-16 DIAGNOSIS — Z9221 Personal history of antineoplastic chemotherapy: Secondary | ICD-10-CM | POA: Insufficient documentation

## 2023-08-16 DIAGNOSIS — Z7983 Long term (current) use of bisphosphonates: Secondary | ICD-10-CM

## 2023-08-16 DIAGNOSIS — Z808 Family history of malignant neoplasm of other organs or systems: Secondary | ICD-10-CM | POA: Diagnosis not present

## 2023-08-16 LAB — CBC (CANCER CENTER ONLY)
HCT: 38.2 % (ref 36.0–46.0)
Hemoglobin: 12.1 g/dL (ref 12.0–15.0)
MCH: 28.5 pg (ref 26.0–34.0)
MCHC: 31.7 g/dL (ref 30.0–36.0)
MCV: 90.1 fL (ref 80.0–100.0)
Platelet Count: 210 10*3/uL (ref 150–400)
RBC: 4.24 MIL/uL (ref 3.87–5.11)
RDW: 13 % (ref 11.5–15.5)
WBC Count: 7.5 10*3/uL (ref 4.0–10.5)
nRBC: 0 % (ref 0.0–0.2)

## 2023-08-16 LAB — CMP (CANCER CENTER ONLY)
ALT: 18 U/L (ref 0–44)
AST: 20 U/L (ref 15–41)
Albumin: 3.8 g/dL (ref 3.5–5.0)
Alkaline Phosphatase: 52 U/L (ref 38–126)
Anion gap: 9 (ref 5–15)
BUN: 18 mg/dL (ref 6–20)
CO2: 25 mmol/L (ref 22–32)
Calcium: 9 mg/dL (ref 8.9–10.3)
Chloride: 101 mmol/L (ref 98–111)
Creatinine: 0.96 mg/dL (ref 0.44–1.00)
GFR, Estimated: >60 mL/min (ref >60)
Glucose, Bld: 92 mg/dL (ref 70–99)
Potassium: 3.8 mmol/L (ref 3.5–5.1)
Sodium: 135 mmol/L (ref 135–145)
Total Bilirubin: 0.5 mg/dL (ref 0.0–1.2)
Total Protein: 7.2 g/dL (ref 6.5–8.1)

## 2023-08-16 MED ORDER — ZOLEDRONIC ACID 4 MG/100ML IV SOLN
4.0000 mg | INTRAVENOUS | Status: DC
  Administered 2023-08-16: 4 mg via INTRAVENOUS
  Filled 2023-08-16: qty 100

## 2023-08-16 MED ORDER — SODIUM CHLORIDE 0.9 % IV SOLN
Freq: Once | INTRAVENOUS | Status: AC
  Filled 2023-08-16: qty 250

## 2023-08-16 NOTE — Progress Notes (Signed)
 Hematology/Oncology Consult note [facility]  Telephone:([phone] [phone]  Patient Care Team: Kandyce Rud, MD as PCP - General (Family Medicine) Creig Hines, MD as Consulting Physician (Hematology and Oncology) Carmina Miller, MD as Consulting Physician (Radiation Oncology) Carolan Shiver, MD as Consulting Physician (General Surgery)   Name of the patient: Emma Cuevas  [id number]  Dec 19, 1965   Date of visit: 08/16/23  Diagnosis- clinical prognostic stage IIIa right breast cancer T3 N1 M0 ER/PR positive HER2 negative   Chief complaint/ Reason for visit-routine follow-up of breast cancer presently on Arimidex and to receive Zometa  Heme/Onc history:  Patient is a 58 year old female who underwent a routine bilateral screening mammogram in September 2022 which showed calcifications in the right breast and prominent right axillary lymph node concerning for malignancy.  This was followed by diagnostic mammogram and ultrasound.  Mammogram showed extensive suspicious pleomorphic calcifications measuring up to 6.8 cm.  2 abnormal lymph nodes were seen in the right axilla on ultrasound as well.  Both the calcifications and the lymph node was biopsied.  Anterior and posterior end of the calcifications was consistent with invasive mammary carcinoma grade 3.  Lymph node was positive for metastatic carcinoma as well.  ER 91 to 100% positive PR 41 to 50% positive and HER2 negative.  Ki-67 40%   MRI bilateral breasts showed non-mass enhancement involving the upper outer lower outer and upper inner quadrants finding 10.9 x 8.4 x 6.8 cm.  Non-mass enhancement in the left breast spanning 1.6 x 1.3 x 0.8 cm.  6 metastatic right axillary lymph nodes.   CT chest abdomen and pelvis with contrast showed mild right axillary adenopathy compatible with metastatic disease but no evidence of distant metastatic disease.  5 mm left lower lobe nodule.  Bone scan was  negative for metastatic disease. Patient completed neoadjuvant chemotherapy with dose dense AC followed by Taxol.  She underwent right mastectomy with immediate reconstruction on 09/16/2021.     Final pathology showedMultiple foci of invasive mammary carcinoma the largest focus being 3 mm with extensive lymphovascular invasion.  Tumor invading this dermis of the skin without skin ulceration.  1 micrometastatic carcinoma in the lymph node and 1 out of 8 metastatic carcinoma in the nonsentinel lymph nodes.  Margins negative.  Arimidex started in September 2023.She is also receiving adjuvant Zometa.  Verzenio started on 02/02/2022.  However patient had issues with left breast mastopexy wound healing and therefore Verzenio was discontinued in January 2024.   Patient completed adjuvant radiation therapy.  she is on adjuvant zometa    Interval history-she complains of occasional dull aching pain in her left arm. Appetite and weight have remained stable.  Denies any new aches and pains anywhere.  Tolerating Arimidex well without any significant side effects.  ECOG PS- 0 Pain scale- 0  Review of systems- Review of Systems  Constitutional:  Negative for chills, fever, malaise/fatigue and weight loss.  HENT:  Negative for congestion, ear discharge and nosebleeds.   Eyes:  Negative for blurred vision.  Respiratory:  Negative for cough, hemoptysis, sputum production, shortness of breath and wheezing.   Cardiovascular:  Negative for chest pain, palpitations, orthopnea and claudication.  Gastrointestinal:  Negative for abdominal pain, blood in stool, constipation, diarrhea, heartburn, melena, nausea and vomiting.  Genitourinary:  Negative for dysuria, flank pain, frequency, hematuria and urgency.  Musculoskeletal:  Negative for back pain, joint pain and myalgias.  Skin:  Negative for rash.  Neurological:  Negative for  dizziness, tingling, focal weakness, seizures, weakness and headaches.   Endo/Heme/Allergies:  Does not bruise/bleed easily.  Psychiatric/Behavioral:  Negative for depression and suicidal ideas. The patient does not have insomnia.       Allergies  Allergen Reactions   Other Rash    DERMABOND SKIN GLUE- localized skin reaction; severe skin rash and blistering   Tegaderm Ag Mesh [Silver] Rash    Itchy/buring tiny red bump   Tape     Redness and burning     Past Medical History:  Diagnosis Date   Actinic keratosis    Anemia    Benign neoplasm of breast 2013   left breast   BRCA negative 2015   Breast cancer (HCC)    Cancer (HCC)    Family history of bladder cancer    Family history of breast cancer    Family history of malignant neoplasm of breast 2013   Family history of pancreatic cancer    Screening for obesity      Past Surgical History:  Procedure Laterality Date   ABDOMINAL HYSTERECTOMY  2011   partial   BREAST BIOPSY Left 2013   BREAST BIOPSY Right 03/14/2021   Stereo bx-anterior calcs, "coil" clip-path pending   BREAST BIOPSY Right 03/14/2021   stereo bx-calcs, "Ribbon" clip-path pending   breast biopsy Right 03/14/2021   [place] Bx, Axilla, path pending   BREAST BIOPSY Left 04/30/2021   BREAST BIOPSY WITH RADIO FREQUENCY LOCALIZER Right 09/09/2021   RF tag in axilla   BREAST RECONSTRUCTION WITH PLACEMENT OF TISSUE EXPANDER AND FLEX HD (ACELLULAR HYDRATED DERMIS) Right 09/16/2021   Procedure: RIGHT BREAST RECONSTRUCTION WITH PLACEMENT OF TISSUE EXPANDER AND FLEX HD (ACELLULAR HYDRATED DERMIS);  Surgeon: Allena Napoleon, MD;  Location: [facility];  Service: Plastics;  Laterality: Right;   BREAST REDUCTION SURGERY Left 05/01/2022   Procedure: MAMMARY REDUCTION  (BREAST);  Surgeon: Santiago Glad, MD;  Location: [facility];  Service: Plastics;  Laterality: Left;   BREAST SURGERY Left 10/16/2011   left breast finesse biopsy   COLONOSCOPY WITH PROPOFOL N/A 12/05/2014   Procedure: COLONOSCOPY WITH PROPOFOL;  Surgeon:  Kieth Brightly, MD;  Location: [facility];  Service: Endoscopy;  Laterality: N/A;   DILATION AND CURETTAGE OF UTERUS     IR CV LINE INJECTION  05/06/2021   IR IMAGING GUIDED PORT INSERTION  05/08/2021   MASTECTOMY Right    PORT-A-CATH REMOVAL  09/16/2021   Procedure: REMOVAL PORT-A-CATH;  Surgeon: Carolan Shiver, MD;  Location: [facility];  Service: General;;   PORTACATH PLACEMENT N/A 04/02/2021   Procedure: INSERTION PORT-A-CATH;  Surgeon: Carolan Shiver, MD;  Location: [facility];  Service: General;  Laterality: N/A;   REMOVAL OF TISSUE EXPANDER AND PLACEMENT OF IMPLANT Right 05/01/2022   Procedure: REMOVAL OF TISSUE EXPANDER AND PLACEMENT OF IMPLANT;  Surgeon: Santiago Glad, MD;  Location: [facility];  Service: Plastics;  Laterality: Right;   SIMPLE MASTECTOMY WITH AXILLARY SENTINEL NODE BIOPSY Right 09/16/2021   Procedure: SIMPLE MASTECTOMY WITH AXILLARY SENTINEL NODE BIOPSY -- RF tag in axillary;  Surgeon: Carolan Shiver, MD;  Location: [facility];  Service: General;  Laterality: Right;    Social History   Socioeconomic History   Marital status: Married    Spouse name: Casimiro Needle   Number of children: Not on file   Years of education: Not on file   Highest education level: Not on file  Occupational History   Not on file  Tobacco Use  Smoking status: Never   Smokeless tobacco: Never  Vaping Use   Vaping status: Never Used  Substance and Sexual Activity   Alcohol use: No   Drug use: No   Sexual activity: Not on file  Other Topics Concern   Not on file  Social History Narrative   Married lives at home with husband   Social Drivers of Corporate investment banker Strain: Low Risk  (06/30/2023)   Received from [facility]   Overall Financial Resource Strain (CARDIA)    Difficulty of Paying Living Expenses: Not hard at all  Food Insecurity: No Food Insecurity (06/30/2023)   Received from [facility]    Hunger Vital Sign    Worried About Running Out of Food in the Last Year: Never true    Ran Out of Food in the Last Year: Never true  Transportation Needs: No Transportation Needs (06/30/2023)   Received from [facility] - [facility]    In the past 12 months, has lack of transportation kept you from medical appointments or from getting medications?: No    Lack of Transportation (Non-Medical): No  Physical Activity: Not on file  Stress: Not on file  Social Connections: Not on file  Intimate Partner Violence: Not on file    Family History  Problem Relation Age of Onset   Breast cancer Mother 26   Bladder Cancer Father 95   Breast cancer Paternal Aunt 39   Pancreatic cancer Paternal Uncle 45   Pancreatic cancer Maternal Grandfather 38   Cancer Cousin        in ear     Current Outpatient Medications:    acetaminophen (TYLENOL) 500 MG tablet, Take 1,000 mg by mouth every 6 (six) hours as needed for moderate pain., Disp: , Rfl:    anastrozole (ARIMIDEX) 1 MG tablet, Take 1 tablet (1 mg total) by mouth daily., Disp: 90 tablet, Rfl: 2   Calcium Carb-Cholecalciferol 500-10 MG-MCG TABS, Take 1 tablet by mouth every morning., Disp: , Rfl:    diphenoxylate-atropine (LOMOTIL) 2.5-0.025 MG tablet, Take 2 tablets by mouth 4 (four) times daily as needed for diarrhea or loose stools. (Patient not taking: Reported on 08/16/2023), Disp: 60 tablet, Rfl: 0   exemestane (AROMASIN) 25 MG tablet, Take 1 tablet (25 mg total) by mouth daily after breakfast. (Patient not taking: Reported on 08/16/2023), Disp: 30 tablet, Rfl: 3 No current facility-administered medications for this visit.  Facility-Administered Medications Ordered in Other Visits:    Zoledronic Acid (ZOMETA) IVPB 4 mg, 4 mg, Intravenous, Q6 months, Creig Hines, MD, Stopped at 08/16/23 1237  Physical exam:  Vitals:   08/16/23 1106  BP: 117/63  Pulse: 79  Resp: 16  Temp: 99.1 F (37.3 C)  TempSrc:  Tympanic  SpO2: 99%  Weight: 186 lb (84.4 kg)   Physical Exam Cardiovascular:     Rate and Rhythm: Normal rate and regular rhythm.     Heart sounds: Normal heart sounds.  Pulmonary:     Effort: Pulmonary effort is normal.     Breath sounds: Normal breath sounds.  Skin:    General: Skin is warm and dry.  Neurological:     Mental Status: She is alert and oriented to person, place, and time.   Breast exam: Patient is s/p right mastectomy with reconstruction.  Implant edges are intact.  No palpable bilateral axillary adenopathy.  She is status post left mastopexy with scarring from prior wound healing  issues.     Latest Ref Rng & Units 08/16/2023   10:48 AM  CMP  Glucose 70 - 99 mg/dL 92   BUN 6 - 20 mg/dL 18   Creatinine 3.08 - 1.00 mg/dL 6.57   Sodium 846 - 962 mmol/L 135   Potassium 3.5 - 5.1 mmol/L 3.8   Chloride 98 - 111 mmol/L 101   CO2 22 - 32 mmol/L 25   Calcium 8.9 - 10.3 mg/dL 9.0   Total Protein 6.5 - 8.1 g/dL 7.2   Total Bilirubin 0.0 - 1.2 mg/dL 0.5   Alkaline Phos 38 - 126 U/L 52   AST 15 - 41 U/L 20   ALT 0 - 44 U/L 18       Latest Ref Rng & Units 08/16/2023   10:48 AM  CBC  WBC 4.0 - 10.5 K/uL 7.5   Hemoglobin 12.0 - 15.0 g/dL 95.2   Hematocrit 84.1 - 46.0 % 38.2   Platelets 150 - 400 K/uL 210     No images are attached to the encounter.  No results found.   Assessment and plan- Patient is a 58 y.o. 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  She is presently on Arimidex and this is a routine follow-up visit  Clinically patient is doing well with no concerning signs and symptoms Is of recurrence based on today's exam.  She had an MRI in December 2024 which showed postsurgical changes in the left breast but a  33-month follow-up MRI was recommended and I will schedule that for June 2025.  Following that we will move to yearly mammograms for the left breast.  Patient is due for a  bone density scan in 2 months and we will schedule that.  Her last bone density scanIn May 2023 was normal.  Patient is also receiving adjuvant Zometa for her breast cancer and today would be dose 4 out of 6.  I will see her back in 6 months with labs and for dose 5 or Zometa.   Visit Diagnosis 1. Encounter for follow-up surveillance of breast cancer   2. Visit for monitoring Arimidex therapy   3. Encounter for monitoring zoledronic acid therapy   4. High risk medication use      Dr. Owens Shark, MD, MPH [facility] [phone] 08/16/2023 1:00 PM

## 2023-08-18 ENCOUNTER — Inpatient Hospital Stay: Admitting: Occupational Therapy

## 2023-08-18 DIAGNOSIS — I890 Lymphedema, not elsewhere classified: Secondary | ICD-10-CM

## 2023-08-18 NOTE — Therapy (Signed)
 [facility] Dept Of [facility] [address] [place], [place], [address] Phone: [phone]   Fax:  [phone]  Occupational Therapy Screen  Patient Details  Name: Emma Cuevas MRN: [id number] Date of Birth: 18-Sep-1965 No data recorded  Encounter Date: 08/18/2023   OT End of Session - 08/18/23 1030     Visit Number 0             Past Medical History:  Diagnosis Date   Actinic keratosis    Anemia    Benign neoplasm of breast 2013   left breast   BRCA negative 2015   Breast cancer (HCC)    Cancer (HCC)    Family history of bladder cancer    Family history of breast cancer    Family history of malignant neoplasm of breast 2013   Family history of pancreatic cancer    Screening for obesity     Past Surgical History:  Procedure Laterality Date   ABDOMINAL HYSTERECTOMY  2011   partial   BREAST BIOPSY Left 2013   BREAST BIOPSY Right 03/14/2021   Stereo bx-anterior calcs, "coil" clip-path pending   BREAST BIOPSY Right 03/14/2021   stereo bx-calcs, "Ribbon" clip-path pending   breast biopsy Right 03/14/2021   [place] Bx, Axilla, path pending   BREAST BIOPSY Left 04/30/2021   BREAST BIOPSY WITH RADIO FREQUENCY LOCALIZER Right 09/09/2021   RF tag in axilla   BREAST RECONSTRUCTION WITH PLACEMENT OF TISSUE EXPANDER AND FLEX HD (ACELLULAR HYDRATED DERMIS) Right 09/16/2021   Procedure: RIGHT BREAST RECONSTRUCTION WITH PLACEMENT OF TISSUE EXPANDER AND FLEX HD (ACELLULAR HYDRATED DERMIS);  Surgeon: Allena Napoleon, MD;  Location: [facility];  Service: Plastics;  Laterality: Right;   BREAST REDUCTION SURGERY Left 05/01/2022   Procedure: MAMMARY REDUCTION  (BREAST);  Surgeon: Santiago Glad, MD;  Location: [facility];  Service: Plastics;  Laterality: Left;   BREAST SURGERY Left 10/16/2011   left breast finesse biopsy   COLONOSCOPY WITH PROPOFOL N/A 12/05/2014   Procedure: COLONOSCOPY WITH  PROPOFOL;  Surgeon: Kieth Brightly, MD;  Location: [facility];  Service: Endoscopy;  Laterality: N/A;   DILATION AND CURETTAGE OF UTERUS     IR CV LINE INJECTION  05/06/2021   IR IMAGING GUIDED PORT INSERTION  05/08/2021   MASTECTOMY Right    PORT-A-CATH REMOVAL  09/16/2021   Procedure: REMOVAL PORT-A-CATH;  Surgeon: Carolan Shiver, MD;  Location: [facility];  Service: General;;   PORTACATH PLACEMENT N/A 04/02/2021   Procedure: INSERTION PORT-A-CATH;  Surgeon: Carolan Shiver, MD;  Location: [facility];  Service: General;  Laterality: N/A;   REMOVAL OF TISSUE EXPANDER AND PLACEMENT OF IMPLANT Right 05/01/2022   Procedure: REMOVAL OF TISSUE EXPANDER AND PLACEMENT OF IMPLANT;  Surgeon: Santiago Glad, MD;  Location: [facility];  Service: Plastics;  Laterality: Right;   SIMPLE MASTECTOMY WITH AXILLARY SENTINEL NODE BIOPSY Right 09/16/2021   Procedure: SIMPLE MASTECTOMY WITH AXILLARY SENTINEL NODE BIOPSY -- RF tag in axillary;  Surgeon: Carolan Shiver, MD;  Location: [facility];  Service: General;  Laterality: Right;    There were no vitals filed for this visit.   Subjective Assessment - 08/18/23 1027     Subjective  Dr. Smith Robert referred me to  check the swelling in my right arm again.  Since have seen you in '23 I had my implants exchange on the R and then had a reduction on  the left breast and that took so long time to heal the wound.  Then for a while I did not had to wear my sleeve on my right arm.  But I have increased swelling in the upper arm now again - I do have grand daughter I pick up and going to work in the yard coming soon    Currently in Pain? No/denies                 LYMPHEDEMA/ONCOLOGY QUESTIONNAIRE - 08/18/23 0001       Right Upper Extremity Lymphedema   15 cm Proximal to Olecranon Process 37.5 cm    10 cm Proximal to Olecranon Process 37 cm    Olecranon Process 28.5 cm    15 cm Proximal to Ulnar Styloid Process 28.4 cm    10  cm Proximal to Ulnar Styloid Process 24.8 cm    Just Proximal to Ulnar Styloid Process 17.2 cm    Across Hand at [facility] 18.5 cm      Left Upper Extremity Lymphedema   15 cm Proximal to Olecranon Process 35 cm    10 cm Proximal to Olecranon Process 34 cm    Olecranon Process 28 cm    15 cm Proximal to Ulnar Styloid Process 27 cm    10 cm Proximal to Ulnar Styloid Process 24.4 cm    Just Proximal to Ulnar Styloid Process 16.8 cm    Across Hand at [facility] 18 cm              08/16/23 Dr Smith Robert note: Assessment and plan- Patient is a 58 y.o. 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  She is presently on Arimidex and this is a routine follow-up visit   Clinically patient is doing well with no concerning signs and symptoms Is of recurrence based on today's exam.  She had an MRI in December 2024 which showed postsurgical changes in the left breast but a  75-month follow-up MRI was recommended and I will schedule that for June 2025.  Following that we will move to yearly mammograms for the left breast.   Patient is due for a bone density scan in 2 months and we will schedule that.  Her last bone density scanIn May 2023 was normal.   Patient is also receiving adjuvant Zometa for her breast cancer and today would be dose 4 out of 6.  I will see her back in 6 months with labs and for dose 5 or Zometa.   Visit Diagnosis 1. Encounter for follow-up surveillance of breast cancer   2. Visit for monitoring Arimidex therapy   3. Encounter for monitoring zoledronic acid therapy   4. High risk medication use      OT SCREEN 08/18/23: Patient was seen in 2023 by this OT for right upper extremity lymphedema after having a right mastectomy with expanders and since this appointment patient had exchange of implants as well as reduction in the left breast. 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 Do have a 43-year-old and 34-1/2-year-old grandkids that they keep. Patient report did not had to wear her sleeve in 24.  But the last few months increased swelling in the right upper arm.  With some achiness.  Her sleeves is still from 2023. Upon circumference patient is increased in the upper arm by 2.5 to 3 cm and in the proximal forearm 1.4  cm. Compared to 2023 patient was increased only by 0.4 to 1 cm.  That is within normal limits. Recommend for patient to get new over-the-counter compression sleeves [employer] like she has had in the past. Wear those sleeves for at least 2 weeks 8 hours a day.  And we will reassess if need to wear for 4 weeks.  And we will reassess at that time to if need any compression at nighttime. Reviewed with patient again precautions and signs and symptoms of lymphedema. Recommend modifications in picking up and carrying grandkids as well as doing yard work. Will asked Dr. Smith Robert to send in a prescription to [facility] for over-the-counter compression sleeves.                                 Visit Diagnosis: Lymphedema    Problem List Patient Active Problem List   Diagnosis Date Noted   Breast wound, left, initial encounter 11/03/2022   Long term use of bisphosphonates 08/03/2022   Breast cancer (HCC) 09/16/2021   Genetic testing 05/06/2021   Family history of pancreatic cancer 04/23/2021   Family history of bladder cancer 04/23/2021   Carcinoma of breast, estrogen and progesterone receptor positive (HCC) 03/25/2021   Goals of care, counseling/discussion 03/25/2021   Menopause 10/05/2016   Mild renal insufficiency 10/05/2016   Benign neoplasm of breast 10/27/2012   Family history of breast cancer 10/27/2012    Oletta Cohn, OTR/L,CLT 08/18/2023, 10:32 AM  [facility] - [facility] Dept Of [place] [address] [place], [place], [address] Phone:  [phone]   Fax:  [phone]  Name: AKASHA MELENA MRN: [id number] Date of Birth: 1965-09-01

## 2023-08-19 ENCOUNTER — Encounter: Payer: Self-pay | Admitting: *Deleted

## 2023-09-01 ENCOUNTER — Inpatient Hospital Stay: Attending: Oncology | Admitting: Occupational Therapy

## 2023-09-01 DIAGNOSIS — Z79899 Other long term (current) drug therapy: Secondary | ICD-10-CM | POA: Insufficient documentation

## 2023-09-01 DIAGNOSIS — Z8052 Family history of malignant neoplasm of bladder: Secondary | ICD-10-CM | POA: Insufficient documentation

## 2023-09-01 DIAGNOSIS — Z800 Family history of malignant neoplasm of digestive organs: Secondary | ICD-10-CM | POA: Insufficient documentation

## 2023-09-01 DIAGNOSIS — I890 Lymphedema, not elsewhere classified: Secondary | ICD-10-CM

## 2023-09-01 DIAGNOSIS — Z1721 Progesterone receptor positive status: Secondary | ICD-10-CM | POA: Insufficient documentation

## 2023-09-01 DIAGNOSIS — Z1732 Human epidermal growth factor receptor 2 negative status: Secondary | ICD-10-CM | POA: Insufficient documentation

## 2023-09-01 DIAGNOSIS — C50812 Malignant neoplasm of overlapping sites of left female breast: Secondary | ICD-10-CM | POA: Insufficient documentation

## 2023-09-01 DIAGNOSIS — Z803 Family history of malignant neoplasm of breast: Secondary | ICD-10-CM | POA: Insufficient documentation

## 2023-09-01 DIAGNOSIS — Z170 Estrogen receptor positive status [ER+]: Secondary | ICD-10-CM | POA: Insufficient documentation

## 2023-09-01 NOTE — Therapy (Addendum)
 [place] [facility] - A Dept Of [place]. [facility] [address] [place], [place], [address] Phone: [phone]   Fax:  [phone]  Occupational Therapy Screen  Patient Details  Name: Emma Cuevas MRN: [id number] Date of Birth: May 01, 1966 No data recorded  Encounter Date: 09/01/2023   OT End of Session - 09/01/23 1720     Visit Number 0             Past Medical History:  Diagnosis Date   Actinic keratosis    Anemia    Benign neoplasm of breast 2013   left breast   BRCA negative 2015   Breast cancer (HCC)    Cancer (HCC)    Family history of bladder cancer    Family history of breast cancer    Family history of malignant neoplasm of breast 2013   Family history of pancreatic cancer    Screening for obesity     Past Surgical History:  Procedure Laterality Date   ABDOMINAL HYSTERECTOMY  2011   partial   BREAST BIOPSY Left 2013   BREAST BIOPSY Right 03/14/2021   Stereo bx-anterior calcs, "coil" clip-path pending   BREAST BIOPSY Right 03/14/2021   stereo bx-calcs, "Ribbon" clip-path pending   breast biopsy Right 03/14/2021   [place] Bx, Axilla, path pending   BREAST BIOPSY Left 04/30/2021   BREAST BIOPSY WITH RADIO FREQUENCY LOCALIZER Right 09/09/2021   RF tag in axilla   BREAST RECONSTRUCTION WITH PLACEMENT OF TISSUE EXPANDER AND FLEX HD (ACELLULAR HYDRATED DERMIS) Right 09/16/2021   Procedure: RIGHT BREAST RECONSTRUCTION WITH PLACEMENT OF TISSUE EXPANDER AND FLEX HD (ACELLULAR HYDRATED DERMIS);  Surgeon: Allena Napoleon, MD;  Location: [facility];  Service: Plastics;  Laterality: Right;   BREAST REDUCTION SURGERY Left 05/01/2022   Procedure: MAMMARY REDUCTION  (BREAST);  Surgeon: Santiago Glad, MD;  Location: [facility];  Service: Plastics;  Laterality: Left;   BREAST SURGERY Left 10/16/2011   left breast finesse biopsy   COLONOSCOPY WITH PROPOFOL N/A 12/05/2014   Procedure: COLONOSCOPY WITH PROPOFOL;   Surgeon: Kieth Brightly, MD;  Location: [facility];  Service: Endoscopy;  Laterality: N/A;   DILATION AND CURETTAGE OF UTERUS     IR CV LINE INJECTION  05/06/2021   IR IMAGING GUIDED PORT INSERTION  05/08/2021   MASTECTOMY Right    PORT-A-CATH REMOVAL  09/16/2021   Procedure: REMOVAL PORT-A-CATH;  Surgeon: Carolan Shiver, MD;  Location: [facility];  Service: General;;   PORTACATH PLACEMENT N/A 04/02/2021   Procedure: INSERTION PORT-A-CATH;  Surgeon: Carolan Shiver, MD;  Location: [facility];  Service: General;  Laterality: N/A;   REMOVAL OF TISSUE EXPANDER AND PLACEMENT OF IMPLANT Right 05/01/2022   Procedure: REMOVAL OF TISSUE EXPANDER AND PLACEMENT OF IMPLANT;  Surgeon: Santiago Glad, MD;  Location: [facility];  Service: Plastics;  Laterality: Right;   SIMPLE MASTECTOMY WITH AXILLARY SENTINEL NODE BIOPSY Right 09/16/2021   Procedure: SIMPLE MASTECTOMY WITH AXILLARY SENTINEL NODE BIOPSY -- RF tag in axillary;  Surgeon: Carolan Shiver, MD;  Location: [facility];  Service: General;  Laterality: Right;    There were no vitals filed for this visit.         LYMPHEDEMA/ONCOLOGY QUESTIONNAIRE - 09/01/23 0001       Right Upper Extremity Lymphedema   15 cm Proximal to Olecranon Process 36.4 cm    10 cm Proximal to Olecranon Process 35.5 cm    Olecranon  Process 28.5 cm    15 cm Proximal to Ulnar Styloid Process 28.4 cm    10 cm Proximal to Ulnar Styloid Process 24.2 cm    Just Proximal to Ulnar Styloid Process 17.2 cm    Across Hand at [facility] 18.5 cm              08/16/23 Dr Smith Robert note: Assessment and plan- Patient is a 58 y.o. 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  She is presently on Arimidex and this is a routine follow-up visit   Clinically patient is doing well with no concerning signs and symptoms Is of recurrence based on today's exam.  She had  an MRI in December 2024 which showed postsurgical changes in the left breast but a  28-month follow-up MRI was recommended and I will schedule that for June 2025.  Following that we will move to yearly mammograms for the left breast.   Patient is due for a bone density scan in 2 months and we will schedule that.  Her last bone density scanIn May 2023 was normal.   Patient is also receiving adjuvant Zometa for her breast cancer and today would be dose 4 out of 6.  I will see her back in 6 months with labs and for dose 5 or Zometa.   Visit Diagnosis 1. Encounter for follow-up surveillance of breast cancer   2. Visit for monitoring Arimidex therapy   3. Encounter for monitoring zoledronic acid therapy   4. High risk medication use      OT SCREEN 08/18/23: Patient was seen in 2023 by this OT for right upper extremity lymphedema after having a right mastectomy with expanders and since this appointment patient had exchange of implants as well as reduction in the left breast. Patient reports still working from home for [employer] on the computer. Do have a 83-year-old and 65-1/2-year-old grandkids that they keep. Patient report did not had to wear her sleeve in 24.  But the last few months increased swelling in the right upper arm.  With some achiness.  Her sleeves is still from 2023. Upon circumference patient is increased in the upper arm by 2.5 to 3 cm and in the proximal forearm 1.4 cm. Compared to 2023 patient was increased only by 0.4 to 1 cm.  That is within normal limits. Recommend for patient to get new over-the-counter compression sleeves Medi Harmony like she has had in the past. Wear those sleeves for at least 2 weeks 8 hours a day.  And we will reassess if need to wear for 4 weeks.  And we will reassess at that time to if need any compression at nighttime. Reviewed with patient again precautions and signs and symptoms of lymphedema. Recommend modifications in picking up and carrying grandkids  as well as doing yard work. Will asked Dr. Smith Robert to send in a prescription to [facility] for over-the-counter compression sleeves.  OT SCREEN  4/9//25: Patient arrive with over-the-counter compression sleeve Harmonie in place.  Patient has been wearing it for about 2 weeks 6 to 8 hours a day.   Circumference measurements taken.  With great decongestion of right upper extremity circumference.  Upper arm decreased by 1.5 cm in forearm 0.6 cm.   Recommend for patient to wear another 2 to 4 weeks compression sleeve 6 to 8 hours a day.   Reviewed with patient donning and doffing as well as wearing.  Sleeve fitting well.   Reviewed with patient again precautions and signs and symptoms of lymphedema. Recommend modifications in picking up and carrying grandkids as well as doing yard work. Will follow-up with patient again 2 to 4 weeks and we will reassess at that time with patient continues to wear it all the time or just with high risk activity.                                  Visit Diagnosis: Lymphedema    Problem List Patient Active Problem List   Diagnosis Date Noted   Breast wound, left, initial encounter 11/03/2022   Long term use of bisphosphonates 08/03/2022   Breast cancer (HCC) 09/16/2021   Genetic testing 05/06/2021   Family history of pancreatic cancer 04/23/2021   Family history of bladder cancer 04/23/2021   Carcinoma of breast, estrogen and progesterone receptor positive (HCC) 03/25/2021   Goals of care, counseling/discussion 03/25/2021   Menopause 10/05/2016   Mild renal insufficiency 10/05/2016   Benign neoplasm of breast 10/27/2012   Family history of breast cancer 10/27/2012    Oletta Cohn, OTR/L,CLT 09/01/2023, 5:25 PM  [facility] - A Dept Of [place]. [facility] [address], [address] [place], [place], [address] Phone: [phone]   Fax:  [phone]  Name: Emma Cuevas MRN:  [id number] Date of Birth: Jul 28, 1965

## 2023-09-29 ENCOUNTER — Inpatient Hospital Stay: Attending: Oncology | Admitting: Occupational Therapy

## 2023-09-29 DIAGNOSIS — I890 Lymphedema, not elsewhere classified: Secondary | ICD-10-CM

## 2023-09-29 NOTE — Therapy (Signed)
 [facility] - A Dept Of [place]. [facility] [address] [place], [place], [address] Phone: [phone]   Fax:  [phone]  Occupational Therapy Screen  Patient Details  Name: Emma Cuevas MRN: [id number] Date of Birth: 10-19-1965 No data recorded  Encounter Date: 09/29/2023   OT End of Session - 09/29/23 1025     Visit Number 0             Past Medical History:  Diagnosis Date   Actinic keratosis    Anemia    Benign neoplasm of breast 2013   left breast   BRCA negative 2015   Breast cancer (HCC)    Cancer (HCC)    Family history of bladder cancer    Family history of breast cancer    Family history of malignant neoplasm of breast 2013   Family history of pancreatic cancer    Screening for obesity     Past Surgical History:  Procedure Laterality Date   ABDOMINAL HYSTERECTOMY  2011   partial   BREAST BIOPSY Left 2013   BREAST BIOPSY Right 03/14/2021   Stereo bx-anterior calcs, "coil" clip-path pending   BREAST BIOPSY Right 03/14/2021   stereo bx-calcs, "Ribbon" clip-path pending   breast biopsy Right 03/14/2021   US  Bx, Axilla, path pending   BREAST BIOPSY Left 04/30/2021   BREAST BIOPSY WITH RADIO FREQUENCY LOCALIZER Right 09/09/2021   RF tag in axilla   BREAST RECONSTRUCTION WITH PLACEMENT OF TISSUE EXPANDER AND FLEX HD (ACELLULAR HYDRATED DERMIS) Right 09/16/2021   Procedure: RIGHT BREAST RECONSTRUCTION WITH PLACEMENT OF TISSUE EXPANDER AND FLEX HD (ACELLULAR HYDRATED DERMIS);  Surgeon: Barb Bonito, MD;  Location: [facility];  Service: Plastics;  Laterality: Right;   BREAST REDUCTION SURGERY Left 05/01/2022   Procedure: MAMMARY REDUCTION  (BREAST);  Surgeon: Teretha Ferguson, MD;  Location: [facility];  Service: Plastics;  Laterality: Left;   BREAST SURGERY Left 10/16/2011   left breast finesse biopsy   COLONOSCOPY WITH PROPOFOL  N/A 12/05/2014   Procedure: COLONOSCOPY WITH PROPOFOL ;   Surgeon: Jerlean Mood, MD;  Location: [facility];  Service: Endoscopy;  Laterality: N/A;   DILATION AND CURETTAGE OF UTERUS     IR CV LINE INJECTION  05/06/2021   IR IMAGING GUIDED PORT INSERTION  05/08/2021   MASTECTOMY Right    PORT-A-CATH REMOVAL  09/16/2021   Procedure: REMOVAL PORT-A-CATH;  Surgeon: Eldred Grego, MD;  Location: [facility];  Service: General;;   PORTACATH PLACEMENT N/A 04/02/2021   Procedure: INSERTION PORT-A-CATH;  Surgeon: Eldred Grego, MD;  Location: [facility];  Service: General;  Laterality: N/A;   REMOVAL OF TISSUE EXPANDER AND PLACEMENT OF IMPLANT Right 05/01/2022   Procedure: REMOVAL OF TISSUE EXPANDER AND PLACEMENT OF IMPLANT;  Surgeon: Teretha Ferguson, MD;  Location: [facility];  Service: Plastics;  Laterality: Right;   SIMPLE MASTECTOMY WITH AXILLARY SENTINEL NODE BIOPSY Right 09/16/2021   Procedure: SIMPLE MASTECTOMY WITH AXILLARY SENTINEL NODE BIOPSY -- RF tag in axillary;  Surgeon: Eldred Grego, MD;  Location: [facility];  Service: General;  Laterality: Right;    There were no vitals filed for this visit.        LYMPHEDEMA/ONCOLOGY QUESTIONNAIRE - 09/29/23 0001       Right Upper Extremity Lymphedema   15 cm Proximal to Olecranon Process 37 cm    10 cm Proximal to Olecranon Process 36 cm    Olecranon Process  28 cm    15 cm Proximal to Ulnar Styloid Process 28.5 cm    10 cm Proximal to Ulnar Styloid Process 24.4 cm    Just Proximal to Ulnar Styloid Process 17 cm    Across Hand at [facility] 18.5 cm                08/16/23 Dr Randy Buttery note: Assessment and plan- Patient is a 58 y.o. 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  She is presently on Arimidex  and this is a routine follow-up visit   Clinically patient is doing well with no concerning signs and symptoms Is of recurrence based on today's exam.  She had an MRI  in December 2024 which showed postsurgical changes in the left breast but a  23-month follow-up MRI was recommended and I will schedule that for June 2025.  Following that we will move to yearly mammograms for the left breast.   Patient is due for a bone density scan in 2 months and we will schedule that.  Her last bone density scanIn May 2023 was normal.   Patient is also receiving adjuvant Zometa  for her breast cancer and today would be dose 4 out of 6.  I will see her back in 6 months with labs and for dose 5 or Zometa .   Visit Diagnosis 1. Encounter for follow-up surveillance of breast cancer   2. Visit for monitoring Arimidex  therapy   3. Encounter for monitoring zoledronic  acid therapy   4. High risk medication use        OT SCREEN 08/18/23: Patient was seen in 2023 by this OT for right upper extremity lymphedema after having a right mastectomy with expanders and since this appointment patient had exchange of implants as well as reduction in the left breast. Patient reports still working from home for [employer] on the computer. Do have a 60-year-old and 45-1/2-year-old grandkids that they keep. Patient report did not had to wear her sleeve in 24.  But the last few months increased swelling in the right upper arm.  With some achiness.  Her sleeves is still from 2023. Upon circumference patient is increased in the upper arm by 2.5 to 3 cm and in the proximal forearm 1.4 cm. Compared to 2023 patient was increased only by 0.4 to 1 cm.  That is within normal limits. Recommend for patient to get new over-the-counter compression sleeves Medi Harmony like she has had in the past. Wear those sleeves for at least 2 weeks 8 hours a day.  And we will reassess if need to wear for 4 weeks.  And we will reassess at that time to if need any compression at nighttime. Reviewed with patient again precautions and signs and symptoms of lymphedema. Recommend modifications in picking up and carrying grandkids as  well as doing yard work. Will asked Dr. Randy Buttery to send in a prescription to [facility] for over-the-counter compression sleeves.   OT SCREEN  4/9//25: Patient arrive with over-the-counter compression sleeve Harmonie in place.  Patient has been wearing it for about 2 weeks 6 to 8 hours a day.   Circumference measurements taken.  With great decongestion of right upper extremity circumference.  Upper arm decreased by 1.5 cm in forearm 0.6 cm.   Recommend for patient to wear another 2 to 4 weeks compression sleeve 6 to 8 hours a day.   Reviewed with patient donning and doffing as well  as wearing.   Sleeve fitting well.   Reviewed with patient again precautions and signs and symptoms of lymphedema. Recommend modifications in picking up and carrying grandkids as well as doing yard work. Will follow-up with patient again 2 to 4 weeks and we will reassess at that time with patient continues to wear it all the time or just with high risk activity.   OT SCREEN  5/7//25: Patient arrive with over-the-counter compression sleeve Harmony in place.  Patient has been wearing it for about 4weeks 6 to 8 hours a day. But [place] at the beach and did not wear in daily for about 5 days Circumference measurements taken.  Had last time great decongestion of right upper extremity circumference wearing sleeve 6-8 hrs - but Upper arm increase some again but still decrease compare to first session .   Recommend for patient to wear another  4 weeks compression sleeve 6 to 8 hours a day.   Reviewed with patient wearing.   Sleeve fitting well.   Reviewed with patient again precautions and signs and symptoms of lymphedema. Recommend modifications in picking up and carrying grandkids as well as doing yard work. Will follow-up with patient again  4 weeks and we will reassess at that time with patient continues to wear it all the time or just with high risk activity.                                    Visit Diagnosis: Lymphedema    Problem List Patient Active Problem List   Diagnosis Date Noted   Breast wound, left, initial encounter 11/03/2022   Long term use of bisphosphonates 08/03/2022   Breast cancer (HCC) 09/16/2021   Genetic testing 05/06/2021   Family history of pancreatic cancer 04/23/2021   Family history of bladder cancer 04/23/2021   Carcinoma of breast, estrogen and progesterone receptor positive (HCC) 03/25/2021   Goals of care, counseling/discussion 03/25/2021   Menopause 10/05/2016   Mild renal insufficiency 10/05/2016   Benign neoplasm of breast 10/27/2012   Family history of breast cancer 10/27/2012    Heloise Lobo, OTR/L,CLT 09/29/2023, 10:28 AM  [facility] - A Dept Of [place]. [facility] [address], [address] [place], [place], [address] Phone: [phone]   Fax:  [phone]  Name: Emma Cuevas MRN: [id number] Date of Birth: [birth year]

## 2023-10-20 ENCOUNTER — Inpatient Hospital Stay: Admitting: Occupational Therapy

## 2023-10-20 DIAGNOSIS — I890 Lymphedema, not elsewhere classified: Secondary | ICD-10-CM

## 2023-10-20 NOTE — Therapy (Signed)
 [facility] - A Dept Of [place]. [facility] [address], [address] [place], [place], [address] Phone: [phone]   Fax:  [phone]  Occupational Therapy Screen  Patient Details  Name: Emma Cuevas MRN: [id number] Date of Birth: [birth year] No data recorded  Encounter Date: 10/20/2023   OT End of Session - 10/20/23 0819     Visit Number 0             Past Medical History:  Diagnosis Date   Actinic keratosis    Anemia    Benign neoplasm of breast 2013   left breast   BRCA negative 2015   Breast cancer (HCC)    Cancer (HCC)    Family history of bladder cancer    Family history of breast cancer    Family history of malignant neoplasm of breast 2013   Family history of pancreatic cancer    Screening for obesity     Past Surgical History:  Procedure Laterality Date   ABDOMINAL HYSTERECTOMY  2011   partial   BREAST BIOPSY Left 2013   BREAST BIOPSY Right 03/14/2021   Stereo bx-anterior calcs, "coil" clip-path pending   BREAST BIOPSY Right 03/14/2021   stereo bx-calcs, "Ribbon" clip-path pending   breast biopsy Right 03/14/2021   US  Bx, Axilla, path pending   BREAST BIOPSY Left 04/30/2021   BREAST BIOPSY WITH RADIO FREQUENCY LOCALIZER Right 09/09/2021   RF tag in axilla   BREAST RECONSTRUCTION WITH PLACEMENT OF TISSUE EXPANDER AND FLEX HD (ACELLULAR HYDRATED DERMIS) Right 09/16/2021   Procedure: RIGHT BREAST RECONSTRUCTION WITH PLACEMENT OF TISSUE EXPANDER AND FLEX HD (ACELLULAR HYDRATED DERMIS);  Surgeon: Barb Bonito, MD;  Location: [facility];  Service: Plastics;  Laterality: Right;   BREAST REDUCTION SURGERY Left 05/01/2022   Procedure: MAMMARY REDUCTION  (BREAST);  Surgeon: Teretha Ferguson, MD;  Location: [facility];  Service: Plastics;  Laterality: Left;   BREAST SURGERY Left 10/16/2011   left breast finesse biopsy   COLONOSCOPY WITH PROPOFOL  N/A 12/05/2014   Procedure: COLONOSCOPY WITH  PROPOFOL ;  Surgeon: Jerlean Mood, MD;  Location: [facility];  Service: Endoscopy;  Laterality: N/A;   DILATION AND CURETTAGE OF UTERUS     IR CV LINE INJECTION  05/06/2021   IR IMAGING GUIDED PORT INSERTION  05/08/2021   MASTECTOMY Right    PORT-A-CATH REMOVAL  09/16/2021   Procedure: REMOVAL PORT-A-CATH;  Surgeon: Eldred Grego, MD;  Location: [facility];  Service: General;;   PORTACATH PLACEMENT N/A 04/02/2021   Procedure: INSERTION PORT-A-CATH;  Surgeon: Eldred Grego, MD;  Location: [facility];  Service: General;  Laterality: N/A;   REMOVAL OF TISSUE EXPANDER AND PLACEMENT OF IMPLANT Right 05/01/2022   Procedure: REMOVAL OF TISSUE EXPANDER AND PLACEMENT OF IMPLANT;  Surgeon: Teretha Ferguson, MD;  Location: [facility];  Service: Plastics;  Laterality: Right;   SIMPLE MASTECTOMY WITH AXILLARY SENTINEL NODE BIOPSY Right 09/16/2021   Procedure: SIMPLE MASTECTOMY WITH AXILLARY SENTINEL NODE BIOPSY -- RF tag in axillary;  Surgeon: Eldred Grego, MD;  Location: [facility];  Service: General;  Laterality: Right;    There were no vitals filed for this visit.        LYMPHEDEMA/ONCOLOGY QUESTIONNAIRE - 10/20/23 0001       Right Upper Extremity Lymphedema   15 cm Proximal to Olecranon Process 36 cm    10 cm Proximal to Olecranon Process 35 cm    Olecranon Process  28 cm    15 cm Proximal to Ulnar Styloid Process 27.4 cm    10 cm Proximal to Ulnar Styloid Process 24 cm    Just Proximal to Ulnar Styloid Process 16.6 cm    Across Hand at [facility] 18 cm      Left Upper Extremity Lymphedema   15 cm Proximal to Olecranon Process 35 cm    10 cm Proximal to Olecranon Process 34 cm    Olecranon Process 28 cm    15 cm Proximal to Ulnar Styloid Process 27 cm    10 cm Proximal to Ulnar Styloid Process 24 cm    Just Proximal to Ulnar Styloid Process 17 cm    Across Hand at [facility] 18 cm            08/16/23 Dr Randy Buttery note: Assessment  and plan- Patient is a 58 y.o. 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  She is presently on Arimidex  and this is a routine follow-up visit   Clinically patient is doing well with no concerning signs and symptoms Is of recurrence based on today's exam.  She had an MRI in December 2024 which showed postsurgical changes in the left breast but a  69-month follow-up MRI was recommended and I will schedule that for June 2025.  Following that we will move to yearly mammograms for the left breast.   Patient is due for a bone density scan in 2 months and we will schedule that.  Her last bone density scanIn May 2023 was normal.   Patient is also receiving adjuvant Zometa  for her breast cancer and today would be dose 4 out of 6.  I will see her back in 6 months with labs and for dose 5 or Zometa .   Visit Diagnosis 1. Encounter for follow-up surveillance of breast cancer   2. Visit for monitoring Arimidex  therapy   3. Encounter for monitoring zoledronic  acid therapy   4. High risk medication use        OT SCREEN 08/18/23: Patient was seen in 2023 by this OT for right upper extremity lymphedema after having a right mastectomy with expanders and since this appointment patient had exchange of implants as well as reduction in the left breast. Patient reports still working from home for [employer] on the computer. Do have a 15-year-old and 37-1/2-year-old grandkids that they keep. Patient report did not had to wear her sleeve in 24.  But the last few months increased swelling in the right upper arm.  With some achiness.  Her sleeves is still from 2023. Upon circumference patient is increased in the upper arm by 2.5 to 3 cm and in the proximal forearm 1.4 cm. Compared to 2023 patient was increased only by 0.4 to 1 cm.  That is within normal limits. Recommend for patient to get new over-the-counter compression sleeves Medi Harmony like she has  had in the past. Wear those sleeves for at least 2 weeks 8 hours a day.  And we will reassess if need to wear for 4 weeks.  And we will reassess at that time to if need any compression at nighttime. Reviewed with patient again precautions and signs and symptoms of lymphedema. Recommend modifications in picking up and carrying grandkids as well as doing yard work. Will asked Dr. Randy Buttery to send in a prescription to [facility] for over-the-counter compression sleeves.   OT SCREEN  4/9//25: Patient  arrive with over-the-counter compression sleeve Harmonie in place.  Patient has been wearing it for about 2 weeks 6 to 8 hours a day.   Circumference measurements taken.  With great decongestion of right upper extremity circumference.  Upper arm decreased by 1.5 cm in forearm 0.6 cm.   Recommend for patient to wear another 2 to 4 weeks compression sleeve 6 to 8 hours a day.   Reviewed with patient donning and doffing as well as wearing.   Sleeve fitting well.   Reviewed with patient again precautions and signs and symptoms of lymphedema. Recommend modifications in picking up and carrying grandkids as well as doing yard work. Will follow-up with patient again 2 to 4 weeks and we will reassess at that time with patient continues to wear it all the time or just with high risk activity.     OT SCREEN  5/7//25: Patient arrive with over-the-counter compression sleeve Harmony in place.  Patient has been wearing it for about 4weeks 6 to 8 hours a day. But was at the beach and did not wear in daily for about 5 days Circumference measurements taken.  Had last time great decongestion of right upper extremity circumference wearing sleeve 6-8 hrs - but Upper arm increase some again but still decrease compare to first session .   Recommend for patient to wear another  4 weeks compression sleeve 6 to 8 hours a day.   Reviewed with patient wearing.   Sleeve fitting well.   Reviewed with patient again precautions and  signs and symptoms of lymphedema. Recommend modifications in picking up and carrying grandkids as well as doing yard work. Will follow-up with patient again  4 weeks and we will reassess at that time with patient continues to wear it all the time or just with high risk activity.   OT SCREEN  5/28//25: Patient arrive with over-the-counter compression sleeve Harmony in place.  Patient has been wearing it for about 8 weeks 6 to 8 hours a day. But was at the beach and did not wear  daily for about 5 days prior to last appt Today after pt wearing it the last 3 wks 6-8 hrs- Circumference measurements taken.   Showed great decrease in circumference  See flowsheet Sleeve fitting well.  Need replacement around Oct/Nov 25  Pt to wear compression now with high risk activities for about 2 months and follow up  Reviewed with patient again precautions and signs and symptoms of lymphedema Pt have 60 yrs old, 2 yr old grand kids and new baby on the way Recommend  last visit modifications in picking up and carrying grandkids as well as doing yard work.    Visit Diagnosis: Lymphedema    Problem List Patient Active Problem List   Diagnosis Date Noted   Breast wound, left, initial encounter 11/03/2022   Long term use of bisphosphonates 08/03/2022   Breast cancer (HCC) 09/16/2021   Genetic testing 05/06/2021   Family history of pancreatic cancer 04/23/2021   Family history of bladder cancer 04/23/2021   Carcinoma of breast, estrogen and progesterone receptor positive (HCC) 03/25/2021   Goals of care, counseling/discussion 03/25/2021   Menopause 10/05/2016   Mild renal insufficiency 10/05/2016   Benign neoplasm of breast 10/27/2012   Family history of breast cancer 10/27/2012    Heloise Lobo, OTR/L,CLT 10/20/2023, 8:21 AM  [facility] - A Dept Of [place]. [facility] [address], [address] [place], [place], [address]  Phone: [phone]   Fax:   [phone]  Name: SHERISSA TENENBAUM MRN: [id number] Date of Birth: 06-04-1965

## 2023-11-11 ENCOUNTER — Ambulatory Visit: Admission: RE | Admit: 2023-11-11 | Source: Ambulatory Visit

## 2023-11-16 ENCOUNTER — Ambulatory Visit
Admission: RE | Admit: 2023-11-16 | Discharge: 2023-11-16 | Disposition: A | Discharge status: Home / Self Care | Source: Ambulatory Visit | Attending: Oncology | Admitting: Oncology

## 2023-11-16 DIAGNOSIS — Z853 Personal history of malignant neoplasm of breast: Secondary | ICD-10-CM | POA: Diagnosis present

## 2023-11-16 DIAGNOSIS — Z08 Encounter for follow-up examination after completed treatment for malignant neoplasm: Secondary | ICD-10-CM | POA: Diagnosis present

## 2023-11-16 MED ORDER — GADOBUTROL 1 MMOL/ML IV SOLN
8.0000 mL | Freq: Once | INTRAVENOUS | Status: AC | PRN
  Administered 2023-11-16: 8 mL via INTRAVENOUS

## 2023-12-22 ENCOUNTER — Inpatient Hospital Stay: Attending: Oncology | Admitting: Occupational Therapy

## 2023-12-22 DIAGNOSIS — I890 Lymphedema, not elsewhere classified: Secondary | ICD-10-CM

## 2023-12-22 NOTE — Therapy (Signed)
 [facility] - A Dept Of [place]. [facility] [address], [address] [place], [place], [address] Phone: [phone]   Fax:  [phone]  Occupational Therapy Screen  Patient Details  Name: Emma Cuevas MRN: [id number] Date of Birth: 20-Feb-1966 No data recorded  Encounter Date: 12/22/2023   OT End of Session - 12/22/23 1654     Visit Number 0          Past Medical History:  Diagnosis Date   Actinic keratosis    Anemia    Benign neoplasm of breast 2013   left breast   BRCA negative 2015   Breast cancer (HCC)    Cancer (HCC)    Family history of bladder cancer    Family history of breast cancer    Family history of malignant neoplasm of breast 2013   Family history of pancreatic cancer    Screening for obesity     Past Surgical History:  Procedure Laterality Date   ABDOMINAL HYSTERECTOMY  2011   partial   BREAST BIOPSY Left 2013   BREAST BIOPSY Right 03/14/2021   Stereo bx-anterior calcs, coil clip-path pending   BREAST BIOPSY Right 03/14/2021   stereo bx-calcs, Ribbon clip-path pending   breast biopsy Right 03/14/2021   US  Bx, Axilla, path pending   BREAST BIOPSY Left 04/30/2021   BREAST BIOPSY WITH RADIO FREQUENCY LOCALIZER Right 09/09/2021   RF tag in axilla   BREAST RECONSTRUCTION WITH PLACEMENT OF TISSUE EXPANDER AND FLEX HD (ACELLULAR HYDRATED DERMIS) Right 09/16/2021   Procedure: RIGHT BREAST RECONSTRUCTION WITH PLACEMENT OF TISSUE EXPANDER AND FLEX HD (ACELLULAR HYDRATED DERMIS);  Surgeon: Elisabeth Craig RAMAN, MD;  Location: [facility];  Service: Plastics;  Laterality: Right;   BREAST REDUCTION SURGERY Left 05/01/2022   Procedure: MAMMARY REDUCTION  (BREAST);  Surgeon: Waddell Leonce NOVAK, MD;  Location: [facility];  Service: Plastics;  Laterality: Left;   BREAST SURGERY Left 10/16/2011   left breast finesse biopsy   COLONOSCOPY WITH PROPOFOL  N/A 12/05/2014   Procedure: COLONOSCOPY WITH PROPOFOL ;   Surgeon: Louanne KANDICE Muse, MD;  Location: [facility];  Service: Endoscopy;  Laterality: N/A;   DILATION AND CURETTAGE OF UTERUS     IR CV LINE INJECTION  05/06/2021   IR IMAGING GUIDED PORT INSERTION  05/08/2021   MASTECTOMY Right    PORT-A-CATH REMOVAL  09/16/2021   Procedure: REMOVAL PORT-A-CATH;  Surgeon: Rodolph Romano, MD;  Location: [facility];  Service: General;;   PORTACATH PLACEMENT N/A 04/02/2021   Procedure: INSERTION PORT-A-CATH;  Surgeon: Rodolph Romano, MD;  Location: [facility];  Service: General;  Laterality: N/A;   REMOVAL OF TISSUE EXPANDER AND PLACEMENT OF IMPLANT Right 05/01/2022   Procedure: REMOVAL OF TISSUE EXPANDER AND PLACEMENT OF IMPLANT;  Surgeon: Waddell Leonce NOVAK, MD;  Location: [facility];  Service: Plastics;  Laterality: Right;   SIMPLE MASTECTOMY WITH AXILLARY SENTINEL NODE BIOPSY Right 09/16/2021   Procedure: SIMPLE MASTECTOMY WITH AXILLARY SENTINEL NODE BIOPSY -- RF tag in axillary;  Surgeon: Rodolph Romano, MD;  Location: [facility];  Service: General;  Laterality: Right;    There were no vitals filed for this visit.  OT SCREEN 08/18/23: Patient was seen in 2023 by this OT for right upper extremity lymphedema after having a right mastectomy with expanders and since this appointment patient had exchange of implants as well as reduction in the left breast. 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 Do have a 10-year-old and 30-1/2-year-old grandkids that they keep. Patient report did not had to wear her sleeve in 24.  But the last few months increased swelling in the right upper arm.  With some achiness.  Her sleeves is still from 2023. Upon circumference patient is increased in the upper arm by 2.5 to 3 cm and in the proximal forearm 1.4 cm. Compared to 2023 patient was increased only by 0.4 to 1 cm.  That is within normal limits. Recommend for patient to get new over-the-counter compression sleeves  Medi Harmony like she has had in the past. Wear those sleeves for at least 2 weeks 8 hours a day.  And we will reassess if need to wear for 4 weeks.  And we will reassess at that time to if need any compression at nighttime. Reviewed with patient again precautions and signs and symptoms of lymphedema. Recommend modifications in picking up and carrying grandkids as well as doing yard work. Will asked Dr. Melanee to send in a prescription to [facility] for over-the-counter compression sleeves.   OT SCREEN  4/9//25: Patient arrive with over-the-counter compression sleeve Harmonie in place.  Patient has been wearing it for about 2 weeks 6 to 8 hours a day.   Circumference measurements taken.  With great decongestion of right upper extremity circumference.  Upper arm decreased by 1.5 cm in forearm 0.6 cm.   Recommend for patient to wear another 2 to 4 weeks compression sleeve 6 to 8 hours a day.   Reviewed with patient donning and doffing as well as wearing.   Sleeve fitting well.   Reviewed with patient again precautions and signs and symptoms of lymphedema. Recommend modifications in picking up and carrying grandkids as well as doing yard work. Will follow-up with patient again 2 to 4 weeks and we will reassess at that time with patient continues to wear it all the time or just with high risk activity.     OT SCREEN  5/7//25: Patient arrive with over-the-counter compression sleeve Harmony in place.  Patient has been wearing it for about 4weeks 6 to 8 hours a day. But was at the beach and did not wear in daily for about 5 days Circumference measurements taken.  Had last time great decongestion of right upper extremity circumference wearing sleeve 6-8 hrs - but Upper arm increase some again but still decrease compare to first session .   Recommend for patient to wear another  4 weeks compression sleeve 6 to 8 hours a day.   Reviewed with patient wearing.   Sleeve fitting well.   Reviewed with  patient again precautions and signs and symptoms of lymphedema. Recommend modifications in picking up and carrying grandkids as well as doing yard work. Will follow-up with patient again  4 weeks and we will reassess at that time with patient continues to wear it all the time or just with high risk activity.    OT SCREEN 12/22/23:  Patient arrive with over-the-counter compression sleeve Harmony in place.  Patient has been wearing it for about 8 to 12 weeks -with high risk activities Circumference measurements taken.  Patient's measurements increased in the right upper extremity. Right upper extremity compared to the left is 2.5 cm in the upper arm increased, elbow 1.5 cm and forearm 1.8 cm. Recommend for patient to wear  compression sleeve again daily 8 hours a day.   Sleeve fitting well.   Reviewed with patient again precautions and signs and symptoms of  lymphedema. Patient starting to exercise program [employer] next week for 12 wks Patient can benefit from lymphedema pump to assist with lymphatic mobilization.   LYMPHEDEMA/ONCOLOGY QUESTIONNAIRE - 12/22/23 0001       Right Upper Extremity Lymphedema   15 cm Proximal to Olecranon Process 36.5 cm    10 cm Proximal to Olecranon Process 36 cm    Olecranon Process 28.5 cm    15 cm Proximal to Ulnar Styloid Process 28 cm    10 cm Proximal to Ulnar Styloid Process 24.5 cm    Just Proximal to Ulnar Styloid Process 16.4 cm    Across Hand at [facility] 18.4 cm      Left Upper Extremity Lymphedema   15 cm Proximal to Olecranon Process 34.5 cm    10 cm Proximal to Olecranon Process 33.5 cm    Olecranon Process 27.5 cm    15 cm Proximal to Ulnar Styloid Process 26.8 cm    10 cm Proximal to Ulnar Styloid Process 23.7 cm    Just Proximal to Ulnar Styloid Process 16.5 cm    Across Hand at [facility] 18.4 cm                                               Visit  Diagnosis: Lymphedema    Problem List Patient Active Problem List   Diagnosis Date Noted   Breast wound, left, initial encounter 11/03/2022   Long term use of bisphosphonates 08/03/2022   Breast cancer (HCC) 09/16/2021   Genetic testing 05/06/2021   Family history of pancreatic cancer 04/23/2021   Family history of bladder cancer 04/23/2021   Carcinoma of breast, estrogen and progesterone receptor positive (HCC) 03/25/2021   Goals of care, counseling/discussion 03/25/2021   Menopause 10/05/2016   Mild renal insufficiency 10/05/2016   Benign neoplasm of breast 10/27/2012   Family history of breast cancer 10/27/2012    Ancel Peters, OTR/L,CLT 12/22/2023, 4:59 PM  [facility] - A Dept Of [place]. [facility] [address] [place], [place], [address] Phone: [phone]   Fax:  [phone]  Name: Emma Cuevas MRN: [id number] Date of Birth: 12-28-1965

## 2023-12-27 ENCOUNTER — Ambulatory Visit
Admission: RE | Admit: 2023-12-27 | Discharge: 2023-12-27 | Disposition: A | Discharge status: Home / Self Care | Source: Ambulatory Visit | Attending: Oncology | Admitting: Oncology

## 2023-12-27 DIAGNOSIS — Z7983 Long term (current) use of bisphosphonates: Secondary | ICD-10-CM | POA: Diagnosis present

## 2023-12-27 DIAGNOSIS — Z5181 Encounter for therapeutic drug level monitoring: Secondary | ICD-10-CM | POA: Insufficient documentation

## 2023-12-27 DIAGNOSIS — Z79899 Other long term (current) drug therapy: Secondary | ICD-10-CM | POA: Diagnosis present

## 2023-12-27 DIAGNOSIS — Z79811 Long term (current) use of aromatase inhibitors: Secondary | ICD-10-CM | POA: Insufficient documentation

## 2024-02-02 ENCOUNTER — Inpatient Hospital Stay: Attending: Oncology | Admitting: Occupational Therapy

## 2024-02-02 DIAGNOSIS — Z7983 Long term (current) use of bisphosphonates: Secondary | ICD-10-CM | POA: Insufficient documentation

## 2024-02-02 DIAGNOSIS — Z1721 Progesterone receptor positive status: Secondary | ICD-10-CM | POA: Insufficient documentation

## 2024-02-02 DIAGNOSIS — Z8052 Family history of malignant neoplasm of bladder: Secondary | ICD-10-CM | POA: Insufficient documentation

## 2024-02-02 DIAGNOSIS — Z803 Family history of malignant neoplasm of breast: Secondary | ICD-10-CM | POA: Insufficient documentation

## 2024-02-02 DIAGNOSIS — Z79899 Other long term (current) drug therapy: Secondary | ICD-10-CM | POA: Insufficient documentation

## 2024-02-02 DIAGNOSIS — I890 Lymphedema, not elsewhere classified: Secondary | ICD-10-CM | POA: Insufficient documentation

## 2024-02-02 DIAGNOSIS — Z1732 Human epidermal growth factor receptor 2 negative status: Secondary | ICD-10-CM | POA: Insufficient documentation

## 2024-02-02 DIAGNOSIS — Z808 Family history of malignant neoplasm of other organs or systems: Secondary | ICD-10-CM | POA: Insufficient documentation

## 2024-02-02 DIAGNOSIS — C50812 Malignant neoplasm of overlapping sites of left female breast: Secondary | ICD-10-CM | POA: Insufficient documentation

## 2024-02-02 DIAGNOSIS — Z9011 Acquired absence of right breast and nipple: Secondary | ICD-10-CM | POA: Insufficient documentation

## 2024-02-02 DIAGNOSIS — Z9221 Personal history of antineoplastic chemotherapy: Secondary | ICD-10-CM | POA: Insufficient documentation

## 2024-02-02 DIAGNOSIS — Z923 Personal history of irradiation: Secondary | ICD-10-CM | POA: Insufficient documentation

## 2024-02-02 DIAGNOSIS — Z86018 Personal history of other benign neoplasm: Secondary | ICD-10-CM | POA: Insufficient documentation

## 2024-02-02 DIAGNOSIS — M7989 Other specified soft tissue disorders: Secondary | ICD-10-CM | POA: Insufficient documentation

## 2024-02-02 DIAGNOSIS — Z800 Family history of malignant neoplasm of digestive organs: Secondary | ICD-10-CM | POA: Insufficient documentation

## 2024-02-02 DIAGNOSIS — Z90710 Acquired absence of both cervix and uterus: Secondary | ICD-10-CM | POA: Insufficient documentation

## 2024-02-02 DIAGNOSIS — Z79811 Long term (current) use of aromatase inhibitors: Secondary | ICD-10-CM | POA: Insufficient documentation

## 2024-02-02 DIAGNOSIS — Z170 Estrogen receptor positive status [ER+]: Secondary | ICD-10-CM | POA: Insufficient documentation

## 2024-02-02 NOTE — Therapy (Signed)
 [facility] - A Dept Of [facility] [address] [place], [place], [address] Phone: [phone]   Fax:  [phone]  Occupational Therapy Screen  Patient Details  Name: Emma Cuevas MRN: [id number] Date of Birth: 1966/03/26 No data recorded  Encounter Date: 02/02/2024   OT End of Session - 02/02/24 0822     Visit Number 0          Past Medical History:  Diagnosis Date   Actinic keratosis    Anemia    Benign neoplasm of breast 2013   left breast   BRCA negative 2015   Breast cancer (HCC)    Cancer (HCC)    Family history of bladder cancer    Family history of breast cancer    Family history of malignant neoplasm of breast 2013   Family history of pancreatic cancer    Screening for obesity     Past Surgical History:  Procedure Laterality Date   ABDOMINAL HYSTERECTOMY  2011   partial   BREAST BIOPSY Left 2013   BREAST BIOPSY Right 03/14/2021   Stereo bx-anterior calcs, coil clip-path pending   BREAST BIOPSY Right 03/14/2021   stereo bx-calcs, Ribbon clip-path pending   breast biopsy Right 03/14/2021   US  Bx, Axilla, path pending   BREAST BIOPSY Left 04/30/2021   BREAST BIOPSY WITH RADIO FREQUENCY LOCALIZER Right 09/09/2021   RF tag in axilla   BREAST RECONSTRUCTION WITH PLACEMENT OF TISSUE EXPANDER AND FLEX HD (ACELLULAR HYDRATED DERMIS) Right 09/16/2021   Procedure: RIGHT BREAST RECONSTRUCTION WITH PLACEMENT OF TISSUE EXPANDER AND FLEX HD (ACELLULAR HYDRATED DERMIS);  Surgeon: Elisabeth Craig RAMAN, MD;  Location: [facility];  Service: Plastics;  Laterality: Right;   BREAST REDUCTION SURGERY Left 05/01/2022   Procedure: MAMMARY REDUCTION  (BREAST);  Surgeon: Waddell Leonce NOVAK, MD;  Location: [facility];  Service: Plastics;  Laterality: Left;   BREAST SURGERY Left 10/16/2011   left breast finesse biopsy   COLONOSCOPY WITH PROPOFOL  N/A 12/05/2014   Procedure: COLONOSCOPY WITH PROPOFOL ;   Surgeon: Louanne KANDICE Muse, MD;  Location: [facility];  Service: Endoscopy;  Laterality: N/A;   DILATION AND CURETTAGE OF UTERUS     IR CV LINE INJECTION  05/06/2021   IR IMAGING GUIDED PORT INSERTION  05/08/2021   MASTECTOMY Right    PORT-A-CATH REMOVAL  09/16/2021   Procedure: REMOVAL PORT-A-CATH;  Surgeon: Rodolph Romano, MD;  Location: [facility];  Service: General;;   PORTACATH PLACEMENT N/A 04/02/2021   Procedure: INSERTION PORT-A-CATH;  Surgeon: Rodolph Romano, MD;  Location: [facility];  Service: General;  Laterality: N/A;   REMOVAL OF TISSUE EXPANDER AND PLACEMENT OF IMPLANT Right 05/01/2022   Procedure: REMOVAL OF TISSUE EXPANDER AND PLACEMENT OF IMPLANT;  Surgeon: Waddell Leonce NOVAK, MD;  Location: [facility];  Service: Plastics;  Laterality: Right;   SIMPLE MASTECTOMY WITH AXILLARY SENTINEL NODE BIOPSY Right 09/16/2021   Procedure: SIMPLE MASTECTOMY WITH AXILLARY SENTINEL NODE BIOPSY -- RF tag in axillary;  Surgeon: Rodolph Romano, MD;  Location: [facility];  Service: General;  Laterality: Right;    There were no vitals filed for this visit.   OT SCREEN 08/18/23: Patient was seen in 2023 by this OT for right upper extremity lymphedema after having a right mastectomy with expanders and since this appointment patient had exchange of implants as well as reduction in the left breast. Patient reports still working from home for [employer] on  the computer. Do have a 60-year-old and 60-1/2-year-old grandkids that they keep. Patient report did not had to wear her sleeve in 24.  But the last few months increased swelling in the right upper arm.  With some achiness.  Her sleeves is still from 2023. Upon circumference patient is increased in the upper arm by 2.5 to 3 cm and in the proximal forearm 1.4 cm. Compared to 2023 patient was increased only by 0.4 to 1 cm.  That is within normal limits. Recommend for patient to get new over-the-counter compression  sleeves Medi Harmony like she has had in the past. Wear those sleeves for at least 2 weeks 8 hours a day.  And we will reassess if need to wear for 4 weeks.  And we will reassess at that time to if need any compression at nighttime. Reviewed with patient again precautions and signs and symptoms of lymphedema. Recommend modifications in picking up and carrying grandkids as well as doing yard work. Will asked Dr. Melanee to send in a prescription to [facility] for over-the-counter compression sleeves.   OT SCREEN  4/9//25: Patient arrive with over-the-counter compression sleeve Harmonie in place.  Patient has been wearing it for about 2 weeks 6 to 8 hours a day.   Circumference measurements taken.  With great decongestion of right upper extremity circumference.  Upper arm decreased by 1.5 cm in forearm 0.6 cm.   Recommend for patient to wear another 2 to 4 weeks compression sleeve 6 to 8 hours a day.   Reviewed with patient donning and doffing as well as wearing.   Sleeve fitting well.   Reviewed with patient again precautions and signs and symptoms of lymphedema. Recommend modifications in picking up and carrying grandkids as well as doing yard work. Will follow-up with patient again 2 to 4 weeks and we will reassess at that time with patient continues to wear it all the time or just with high risk activity.     OT SCREEN  5/7//25: Patient arrive with over-the-counter compression sleeve Harmony in place.  Patient has been wearing it for about 4weeks 6 to 8 hours a day. But [place] the [place] and did not wear in daily for about 5 days Circumference measurements taken.  Had last time great decongestion of right upper extremity circumference wearing sleeve 6-8 hrs - but Upper arm increase some again but still decrease compare to first session .   Recommend for patient to wear another  4 weeks compression sleeve 6 to 8 hours a day.   Reviewed with patient wearing.   Sleeve fitting well.   Reviewed  with patient again precautions and signs and symptoms of lymphedema. Recommend modifications in picking up and carrying grandkids as well as doing yard work. Will follow-up with patient again  4 weeks and we will reassess at that time with patient continues to wear it all the time or just with high risk activity.      OT SCREEN 12/22/23:  Patient arrive with over-the-counter compression sleeve Harmony in place.  Patient has been wearing it for about 8 to 12 weeks -with high risk activities Circumference measurements taken.  Patient's measurements increased in the right upper extremity. Right upper extremity compared to the left is 2.5 cm in the upper arm increased, elbow 1.5 cm and forearm 1.8 cm. Recommend for patient to wear  compression sleeve again daily 8 hours a day.   Sleeve fitting well.   Reviewed with patient again precautions and signs  and symptoms of lymphedema. Patient starting to exercise program [employer] next week for 12 wks Patient can benefit from lymphedema pump to assist with lymphatic mobilization.   02/02/24:   Patient arrive  after 5 wks of wearing  over-the-counter compression sleeve Harmony - arrive with it on.   Pt had been wearing it for 5 days a week all day - and hight risk activities  Patient started  exercise program [employer] and she is in week 5 - doing well - more muscle and strength - endurance - show in her circumference  See flowsheet Right upper extremity compared to the left  still increase 2 - 2.5 cm in the upper arm increased, elbow  same and forearm 1.8 cm. Recommend for patient to cont to wear  compression sleeve daily 8 hours a day.   Sleeve fitting well.  She had 15 ln removed and high risk . She has 3 young grandkids- one of them newborn. Pt to see Dr Melanee in 2 wks Patient can benefit from lymphedema pump to assist with lymphatic mobilization in the future. Will check on pt last week of [employer] program   LYMPHEDEMA/ONCOLOGY  QUESTIONNAIRE - 02/02/24 0001       Right Upper Extremity Lymphedema   15 cm Proximal to Olecranon Process 36.5 cm    10 cm Proximal to Olecranon Process 36.4 cm    Olecranon Process 28 cm    15 cm Proximal to Ulnar Styloid Process 28 cm    10 cm Proximal to Ulnar Styloid Process 24.2 cm    Just Proximal to Ulnar Styloid Process 16.5 cm    Across Hand at [facility] 19 cm      Left Upper Extremity Lymphedema   15 cm Proximal to Olecranon Process 34.5 cm    10 cm Proximal to Olecranon Process 34 cm    Olecranon Process 28 cm    15 cm Proximal to Ulnar Styloid Process 26.7 cm    10 cm Proximal to Ulnar Styloid Process 24.5 cm    Just Proximal to Ulnar Styloid Process 16.5 cm                                              Visit Diagnosis: Lymphedema    Problem List Patient Active Problem List   Diagnosis Date Noted   Breast wound, left, initial encounter 11/03/2022   Long term use of bisphosphonates 08/03/2022   Breast cancer (HCC) 09/16/2021   Genetic testing 05/06/2021   Family history of pancreatic cancer 04/23/2021   Family history of bladder cancer 04/23/2021   Carcinoma of breast, estrogen and progesterone receptor positive (HCC) 03/25/2021   Goals of care, counseling/discussion 03/25/2021   Menopause 10/05/2016   Mild renal insufficiency 10/05/2016   Benign neoplasm of breast 10/27/2012   Family history of breast cancer 10/27/2012    Ancel Peters, OTR/L,CLT 02/02/2024, 8:25 AM  [facility] - A Dept Of [place]. [facility] [address], [address] [place], [place], [address] Phone: [phone]   Fax:  [phone]  Name: LYLIE BLACKLOCK MRN: [id number] Date of Birth: 08-04-1965

## 2024-02-08 ENCOUNTER — Other Ambulatory Visit: Payer: Self-pay | Admitting: Oncology

## 2024-02-09 ENCOUNTER — Encounter: Payer: Self-pay | Admitting: Oncology

## 2024-02-16 ENCOUNTER — Inpatient Hospital Stay

## 2024-02-16 ENCOUNTER — Encounter: Payer: Self-pay | Admitting: Oncology

## 2024-02-16 ENCOUNTER — Inpatient Hospital Stay: Admitting: Oncology

## 2024-02-16 VITALS — BP 100/67 | HR 66 | Temp 98.3°F | Resp 16 | Wt 186.0 lb

## 2024-02-16 DIAGNOSIS — I890 Lymphedema, not elsewhere classified: Secondary | ICD-10-CM | POA: Diagnosis not present

## 2024-02-16 DIAGNOSIS — Z1721 Progesterone receptor positive status: Secondary | ICD-10-CM

## 2024-02-16 DIAGNOSIS — Z08 Encounter for follow-up examination after completed treatment for malignant neoplasm: Secondary | ICD-10-CM

## 2024-02-16 DIAGNOSIS — Z86018 Personal history of other benign neoplasm: Secondary | ICD-10-CM | POA: Diagnosis not present

## 2024-02-16 DIAGNOSIS — M7989 Other specified soft tissue disorders: Secondary | ICD-10-CM | POA: Diagnosis not present

## 2024-02-16 DIAGNOSIS — Z8052 Family history of malignant neoplasm of bladder: Secondary | ICD-10-CM | POA: Diagnosis not present

## 2024-02-16 DIAGNOSIS — Z7983 Long term (current) use of bisphosphonates: Secondary | ICD-10-CM

## 2024-02-16 DIAGNOSIS — C50011 Malignant neoplasm of nipple and areola, right female breast: Secondary | ICD-10-CM

## 2024-02-16 DIAGNOSIS — Z90710 Acquired absence of both cervix and uterus: Secondary | ICD-10-CM | POA: Diagnosis not present

## 2024-02-16 DIAGNOSIS — Z79811 Long term (current) use of aromatase inhibitors: Secondary | ICD-10-CM | POA: Diagnosis not present

## 2024-02-16 DIAGNOSIS — Z803 Family history of malignant neoplasm of breast: Secondary | ICD-10-CM | POA: Diagnosis not present

## 2024-02-16 DIAGNOSIS — Z170 Estrogen receptor positive status [ER+]: Secondary | ICD-10-CM | POA: Diagnosis not present

## 2024-02-16 DIAGNOSIS — Z79899 Other long term (current) drug therapy: Secondary | ICD-10-CM | POA: Diagnosis not present

## 2024-02-16 DIAGNOSIS — Z1732 Human epidermal growth factor receptor 2 negative status: Secondary | ICD-10-CM | POA: Diagnosis not present

## 2024-02-16 DIAGNOSIS — Z5181 Encounter for therapeutic drug level monitoring: Secondary | ICD-10-CM

## 2024-02-16 DIAGNOSIS — C50812 Malignant neoplasm of overlapping sites of left female breast: Secondary | ICD-10-CM | POA: Diagnosis not present

## 2024-02-16 DIAGNOSIS — Z923 Personal history of irradiation: Secondary | ICD-10-CM | POA: Diagnosis not present

## 2024-02-16 DIAGNOSIS — Z9011 Acquired absence of right breast and nipple: Secondary | ICD-10-CM | POA: Diagnosis not present

## 2024-02-16 DIAGNOSIS — Z808 Family history of malignant neoplasm of other organs or systems: Secondary | ICD-10-CM | POA: Diagnosis not present

## 2024-02-16 DIAGNOSIS — Z800 Family history of malignant neoplasm of digestive organs: Secondary | ICD-10-CM | POA: Diagnosis not present

## 2024-02-16 DIAGNOSIS — Z9221 Personal history of antineoplastic chemotherapy: Secondary | ICD-10-CM | POA: Diagnosis not present

## 2024-02-16 LAB — CBC WITH DIFFERENTIAL (CANCER CENTER ONLY)
Abs Immature Granulocytes: 0.01 K/uL (ref 0.00–0.07)
Basophils Absolute: 0 K/uL (ref 0.0–0.1)
Basophils Relative: 1 %
Eosinophils Absolute: 0.2 K/uL (ref 0.0–0.5)
Eosinophils Relative: 4 %
HCT: 37.4 % (ref 36.0–46.0)
Hemoglobin: 12 g/dL (ref 12.0–15.0)
Immature Granulocytes: 0 %
Lymphocytes Relative: 46 %
Lymphs Abs: 2.3 K/uL (ref 0.7–4.0)
MCH: 28.1 pg (ref 26.0–34.0)
MCHC: 32.1 g/dL (ref 30.0–36.0)
MCV: 87.6 fL (ref 80.0–100.0)
Monocytes Absolute: 0.4 K/uL (ref 0.1–1.0)
Monocytes Relative: 9 %
Neutro Abs: 1.9 K/uL (ref 1.7–7.7)
Neutrophils Relative %: 40 %
Platelet Count: 189 K/uL (ref 150–400)
RBC: 4.27 MIL/uL (ref 3.87–5.11)
RDW: 14 % (ref 11.5–15.5)
WBC Count: 4.8 K/uL (ref 4.0–10.5)
nRBC: 0 % (ref 0.0–0.2)

## 2024-02-16 LAB — CMP (CANCER CENTER ONLY)
ALT: 17 U/L (ref 0–44)
AST: 22 U/L (ref 15–41)
Albumin: 4 g/dL (ref 3.5–5.0)
Alkaline Phosphatase: 57 U/L (ref 38–126)
Anion gap: 8 (ref 5–15)
BUN: 21 mg/dL — ABNORMAL HIGH (ref 6–20)
CO2: 25 mmol/L (ref 22–32)
Calcium: 9.4 mg/dL (ref 8.9–10.3)
Chloride: 102 mmol/L (ref 98–111)
Creatinine: 0.88 mg/dL (ref 0.44–1.00)
GFR, Estimated: >60 mL/min (ref >60)
Glucose, Bld: 108 mg/dL — ABNORMAL HIGH (ref 70–99)
Potassium: 4 mmol/L (ref 3.5–5.1)
Sodium: 135 mmol/L (ref 135–145)
Total Bilirubin: 0.8 mg/dL (ref 0.0–1.2)
Total Protein: 7.3 g/dL (ref 6.5–8.1)

## 2024-02-16 MED ORDER — ZOLEDRONIC ACID 4 MG/100ML IV SOLN
4.0000 mg | INTRAVENOUS | Status: DC
  Administered 2024-02-16: 4 mg via INTRAVENOUS
  Filled 2024-02-16: qty 100

## 2024-02-16 MED ORDER — SODIUM CHLORIDE 0.9 % IV SOLN
INTRAVENOUS | Status: DC
  Filled 2024-02-16: qty 250

## 2024-02-16 NOTE — Patient Instructions (Signed)

## 2024-02-16 NOTE — Progress Notes (Signed)
 Hematology/Oncology Consult note [facility]  [phone] [phone]  Patient Care Team: Diedra Lame, MD as PCP - General (Family Medicine) Melanee Annah BROCKS, MD as Consulting Physician (Oncology) Lenn Aran, MD as Consulting Physician (Radiation Oncology) Rodolph Romano, MD as Consulting Physician (General Surgery)   Name of the patient: Emma Cuevas  [id number]  December 17, 1965   Date of visit: 02/16/24  Diagnosis- clinical prognostic stage IIIa right breast cancer T3 N1 M0 ER/PR positive HER2 negative   Chief complaint/ Reason for visit-routine follow-up of breast cancer and to receive Zometa   Heme/Onc history:  Patient is a 59 year old female who underwent a routine bilateral screening mammogram in September 2022 which showed calcifications in the right breast and prominent right axillary lymph node concerning for malignancy.  This was followed by diagnostic mammogram and ultrasound.  Mammogram showed extensive suspicious pleomorphic calcifications measuring up to 6.8 cm.  2 abnormal lymph nodes were seen in the right axilla on ultrasound as well.  Both the calcifications and the lymph node was biopsied.  Anterior and posterior end of the calcifications was consistent with invasive mammary carcinoma grade 3.  Lymph node was positive for metastatic carcinoma as well.  ER 91 to 100% positive PR 41 to 50% positive and HER2 negative.  Ki-67 40%   MRI bilateral breasts showed non-mass enhancement involving the upper outer lower outer and upper inner quadrants finding 10.9 x 8.4 x 6.8 cm.  Non-mass enhancement in the left breast spanning 1.6 x 1.3 x 0.8 cm.  6 metastatic right axillary lymph nodes.   CT chest abdomen and pelvis with contrast showed mild right axillary adenopathy compatible with metastatic disease but no evidence of distant metastatic disease.  5 mm left lower lobe nodule.  Bone scan was negative for metastatic  disease. Patient completed neoadjuvant chemotherapy with dose dense AC followed by Taxol .  She underwent right mastectomy with immediate reconstruction on 09/16/2021.     Final pathology showedMultiple foci of invasive mammary carcinoma the largest focus being 3 mm with extensive lymphovascular invasion.  Tumor invading this dermis of the skin without skin ulceration.  1 micrometastatic carcinoma in the lymph node and 1 out of 8 metastatic carcinoma in the nonsentinel lymph nodes.  Margins negative.  Arimidex  started in September 2023.She is also receiving adjuvant Zometa .  Verzenio  started on 02/02/2022.  However patient had issues with left breast mastopexy wound healing and therefore Verzenio  was discontinued in January 2024.   Patient completed adjuvant radiation therapy.  she is on adjuvant zometa     Interval history-tolerating Arimidex  well without any significant side effects.  She is also following up with [facility] to help with right upper extremity swelling.  Denies any changes in her appetite or weight.  Denies any new aches and pains anywhere  ECOG PS- 0 Pain scale- 0   Review of systems- Review of Systems  Constitutional:  Negative for chills, fever, malaise/fatigue and weight loss.  HENT:  Negative for congestion, ear discharge and nosebleeds.   Eyes:  Negative for blurred vision.  Respiratory:  Negative for cough, hemoptysis, sputum production, shortness of breath and wheezing.   Cardiovascular:  Negative for chest pain, palpitations, orthopnea and claudication.  Gastrointestinal:  Negative for abdominal pain, blood in stool, constipation, diarrhea, heartburn, melena, nausea and vomiting.  Genitourinary:  Negative for dysuria, flank pain, frequency, hematuria and urgency.  Musculoskeletal:  Negative for back pain, joint pain and myalgias.  Skin:  Negative for rash.  Neurological:  Negative for dizziness, tingling, focal weakness, seizures, weakness and headaches.   Endo/Heme/Allergies:  Does not bruise/bleed easily.  Psychiatric/Behavioral:  Negative for depression and suicidal ideas. The patient does not have insomnia.       Allergies  Allergen Reactions   Other Rash    DERMABOND SKIN GLUE- localized skin reaction; severe skin rash and blistering   Tegaderm Ag Mesh [Silver] Rash    Itchy/buring tiny red bump   Tape     Redness and burning     Past Medical History:  Diagnosis Date   Actinic keratosis    Anemia    Benign neoplasm of breast 2013   left breast   BRCA negative 2015   Breast cancer (HCC)    Cancer (HCC)    Family history of bladder cancer    Family history of breast cancer    Family history of malignant neoplasm of breast 2013   Family history of pancreatic cancer    Screening for obesity      Past Surgical History:  Procedure Laterality Date   ABDOMINAL HYSTERECTOMY  2011   partial   BREAST BIOPSY Left 2013   BREAST BIOPSY Right 03/14/2021   Stereo bx-anterior calcs, coil clip-path pending   BREAST BIOPSY Right 03/14/2021   stereo bx-calcs, Ribbon clip-path pending   breast biopsy Right 03/14/2021   US  Bx, Axilla, path pending   BREAST BIOPSY Left 04/30/2021   BREAST BIOPSY WITH RADIO FREQUENCY LOCALIZER Right 09/09/2021   RF tag in axilla   BREAST RECONSTRUCTION WITH PLACEMENT OF TISSUE EXPANDER AND FLEX HD (ACELLULAR HYDRATED DERMIS) Right 09/16/2021   Procedure: RIGHT BREAST RECONSTRUCTION WITH PLACEMENT OF TISSUE EXPANDER AND FLEX HD (ACELLULAR HYDRATED DERMIS);  Surgeon: Elisabeth Craig RAMAN, MD;  Location: [facility];  Service: Plastics;  Laterality: Right;   BREAST REDUCTION SURGERY Left 05/01/2022   Procedure: MAMMARY REDUCTION  (BREAST);  Surgeon: Waddell Leonce NOVAK, MD;  Location: [facility];  Service: Plastics;  Laterality: Left;   BREAST SURGERY Left 10/16/2011   left breast finesse biopsy   COLONOSCOPY WITH PROPOFOL  N/A 12/05/2014   Procedure: COLONOSCOPY WITH PROPOFOL ;  Surgeon:  Louanne KANDICE Muse, MD;  Location: [facility];  Service: Endoscopy;  Laterality: N/A;   DILATION AND CURETTAGE OF UTERUS     IR CV LINE INJECTION  05/06/2021   IR IMAGING GUIDED PORT INSERTION  05/08/2021   MASTECTOMY Right    PORT-A-CATH REMOVAL  09/16/2021   Procedure: REMOVAL PORT-A-CATH;  Surgeon: Rodolph Romano, MD;  Location: [facility];  Service: General;;   PORTACATH PLACEMENT N/A 04/02/2021   Procedure: INSERTION PORT-A-CATH;  Surgeon: Rodolph Romano, MD;  Location: [facility];  Service: General;  Laterality: N/A;   REMOVAL OF TISSUE EXPANDER AND PLACEMENT OF IMPLANT Right 05/01/2022   Procedure: REMOVAL OF TISSUE EXPANDER AND PLACEMENT OF IMPLANT;  Surgeon: Waddell Leonce NOVAK, MD;  Location: [facility];  Service: Plastics;  Laterality: Right;   SIMPLE MASTECTOMY WITH AXILLARY SENTINEL NODE BIOPSY Right 09/16/2021   Procedure: SIMPLE MASTECTOMY WITH AXILLARY SENTINEL NODE BIOPSY -- RF tag in axillary;  Surgeon: Rodolph Romano, MD;  Location: [facility];  Service: General;  Laterality: Right;    Social History   Socioeconomic History   Marital status: Married    Spouse name: Ozell   Number of children: Not on file   Years of education: Not on file   Highest education level: Not on file  Occupational History   Not on file  Tobacco Use  Smoking status: Never   Smokeless tobacco: Never  Vaping Use   Vaping status: Never Used  Substance and Sexual Activity   Alcohol use: No   Drug use: No   Sexual activity: Not on file  Other Topics Concern   Not on file  Social History Narrative   Married lives at home with husband   Social Drivers of Corporate investment banker Strain: Low Risk  (06/30/2023)   Received from [facility]   Overall Financial Resource Strain (CARDIA)    Difficulty of Paying Living Expenses: Not hard at all  Food Insecurity: No Food Insecurity (06/30/2023)   Received from [facility]    Hunger Vital Sign    Within the past 12 months, you worried that your food would run out before you got the money to buy more.: Never true    Within the past 12 months, the food you bought just didn't last and you didn't have money to get more.: Never true  Transportation Needs: No Transportation Needs (06/30/2023)   Received from [facility]    In the past 12 months, has lack of transportation kept you from medical appointments or from getting medications?: No    Lack of Transportation (Non-Medical): No  Physical Activity: Not on file  Stress: Not on file  Social Connections: Not on file  Intimate Partner Violence: Not on file    Family History  Problem Relation Age of Onset   Breast cancer Mother 60   Bladder Cancer Father 79   Breast cancer Paternal Aunt 3   Pancreatic cancer Paternal Uncle 35   Pancreatic cancer Maternal Grandfather 87   Cancer Cousin        in ear     Current Outpatient Medications:    acetaminophen  (TYLENOL ) 500 MG tablet, Take 1,000 mg by mouth every 6 (six) hours as needed for moderate pain., Disp: , Rfl:    anastrozole  (ARIMIDEX ) 1 MG tablet, TAKE 1 TABLET(1 MG) BY MOUTH DAILY, Disp: 90 tablet, Rfl: 2   Calcium  Carb-Cholecalciferol 500-10 MG-MCG TABS, Take 1 tablet by mouth every morning., Disp: , Rfl:    diphenoxylate -atropine  (LOMOTIL ) 2.5-0.025 MG tablet, Take 2 tablets by mouth 4 (four) times daily as needed for diarrhea or loose stools. (Patient not taking: Reported on 02/16/2024), Disp: 60 tablet, Rfl: 0   exemestane  (AROMASIN ) 25 MG tablet, Take 1 tablet (25 mg total) by mouth daily after breakfast. (Patient not taking: Reported on 02/16/2024), Disp: 30 tablet, Rfl: 3 No current facility-administered medications for this visit.  Facility-Administered Medications Ordered in Other Visits:    0.9 %  sodium chloride  infusion, , Intravenous, Continuous, Melanee Annah BROCKS, MD, Stopped at 02/16/24 1150    Zoledronic  Acid (ZOMETA ) IVPB 4 mg, 4 mg, Intravenous, Q6 months, Melanee Annah BROCKS, MD, Stopped at 02/16/24 1147  Physical exam:  Vitals:   02/16/24 1102  BP: 100/67  Pulse: 66  Resp: 16  Temp: 98.3 F (36.8 C)  TempSrc: Tympanic  SpO2: 99%  Weight: 186 lb (84.4 kg)   Physical Exam Cardiovascular:     Rate and Rhythm: Normal rate and regular rhythm.     Heart sounds: Normal heart sounds.  Pulmonary:     Effort: Pulmonary effort is normal.     Breath sounds: Normal breath sounds.  Skin:    General: Skin is warm and dry.  Neurological:     Mental Status: She is alert and oriented  to person, place, and time.   Breast exam: Patient is s/p right mastectomy with reconstruction.  No evidence of chest wall recurrence.  No palpable bilateral axillary adenopathy.  No palpable masses in the left breast  I have personally reviewed labs listed below:    Latest Ref Rng & Units 02/16/2024   10:54 AM  CMP  Glucose 70 - 99 mg/dL 891   BUN 6 - 20 mg/dL 21   Creatinine 9.55 - 1.00 mg/dL 9.11   Sodium 864 - 854 mmol/L 135   Potassium 3.5 - 5.1 mmol/L 4.0   Chloride 98 - 111 mmol/L 102   CO2 22 - 32 mmol/L 25   Calcium  8.9 - 10.3 mg/dL 9.4   Total Protein 6.5 - 8.1 g/dL 7.3   Total Bilirubin 0.0 - 1.2 mg/dL 0.8   Alkaline Phos 38 - 126 U/L 57   AST 15 - 41 U/L 22   ALT 0 - 44 U/L 17       Latest Ref Rng & Units 02/16/2024   10:54 AM  CBC  WBC 4.0 - 10.5 K/uL 4.8   Hemoglobin 12.0 - 15.0 g/dL 87.9   Hematocrit 63.9 - 46.0 % 37.4   Platelets 150 - 400 K/uL 189       Assessment and plan- Patient is a 58 y.o. 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  She is here for a routine follow-up visit for breast cancer  Clinically patient is doing well with no concerning signs and symptoms of recurrence based on today's exam.  She has been getting MRIs over the last 1 year due to presence of postsurgical changes in the  left breast which were followed.  She is due for a mammogram at this time which we will schedule.  Also her bone density scan continues to be normal.  She will continue with Arimidex  at this time.  She is receiving dose 5 out of 6 of Zometa  today.  I will see her back in 6 months with labs for dose 6 of Zometa  which would be her last dose   Visit Diagnosis 1. High risk medication use   2. Encounter for follow-up surveillance of breast cancer   3. Visit for monitoring Arimidex  therapy   4. Encounter for monitoring zoledronate therapy      Dr. Annah Skene, MD, MPH [facility] [phone] 02/16/2024 1:51 PM

## 2024-03-09 ENCOUNTER — Ambulatory Visit
Admission: RE | Admit: 2024-03-09 | Discharge: 2024-03-09 | Disposition: A | Discharge status: Home / Self Care | Source: Ambulatory Visit | Attending: Oncology | Admitting: Oncology

## 2024-03-09 DIAGNOSIS — Z1231 Encounter for screening mammogram for malignant neoplasm of breast: Secondary | ICD-10-CM | POA: Insufficient documentation

## 2024-03-09 DIAGNOSIS — Z08 Encounter for follow-up examination after completed treatment for malignant neoplasm: Secondary | ICD-10-CM

## 2024-03-09 DIAGNOSIS — Z853 Personal history of malignant neoplasm of breast: Secondary | ICD-10-CM | POA: Insufficient documentation

## 2024-05-04 ENCOUNTER — Ambulatory Visit (INDEPENDENT_AMBULATORY_CARE_PROVIDER_SITE_OTHER): Payer: Managed Care, Other (non HMO) | Admitting: Dermatology

## 2024-05-04 ENCOUNTER — Encounter: Payer: Self-pay | Admitting: Dermatology

## 2024-05-04 DIAGNOSIS — W908XXA Exposure to other nonionizing radiation, initial encounter: Secondary | ICD-10-CM

## 2024-05-04 DIAGNOSIS — Z7189 Other specified counseling: Secondary | ICD-10-CM

## 2024-05-04 DIAGNOSIS — L814 Other melanin hyperpigmentation: Secondary | ICD-10-CM | POA: Diagnosis not present

## 2024-05-04 DIAGNOSIS — L719 Rosacea, unspecified: Secondary | ICD-10-CM

## 2024-05-04 DIAGNOSIS — L905 Scar conditions and fibrosis of skin: Secondary | ICD-10-CM | POA: Diagnosis not present

## 2024-05-04 DIAGNOSIS — Z853 Personal history of malignant neoplasm of breast: Secondary | ICD-10-CM | POA: Diagnosis not present

## 2024-05-04 DIAGNOSIS — Z1283 Encounter for screening for malignant neoplasm of skin: Secondary | ICD-10-CM | POA: Diagnosis not present

## 2024-05-04 DIAGNOSIS — L821 Other seborrheic keratosis: Secondary | ICD-10-CM

## 2024-05-04 DIAGNOSIS — I781 Nevus, non-neoplastic: Secondary | ICD-10-CM | POA: Diagnosis not present

## 2024-05-04 DIAGNOSIS — D1801 Hemangioma of skin and subcutaneous tissue: Secondary | ICD-10-CM | POA: Diagnosis not present

## 2024-05-04 DIAGNOSIS — Z79899 Other long term (current) drug therapy: Secondary | ICD-10-CM

## 2024-05-04 DIAGNOSIS — L578 Other skin changes due to chronic exposure to nonionizing radiation: Secondary | ICD-10-CM | POA: Diagnosis not present

## 2024-05-04 DIAGNOSIS — D229 Melanocytic nevi, unspecified: Secondary | ICD-10-CM

## 2024-05-04 NOTE — Patient Instructions (Signed)
 Melanoma ABCDEs  Melanoma is the most dangerous type of skin cancer, and is the leading cause of death from skin disease.  You are more likely to develop melanoma if you: Have light-colored skin, light-colored eyes, or red or blond hair Spend a lot of time in the sun Tan regularly, either outdoors or in a tanning bed Have had blistering sunburns, especially during childhood Have a close family member who has had a melanoma Have atypical moles or large birthmarks  Early detection of melanoma is key since treatment is typically straightforward and cure rates are extremely high if we catch it early.   The first sign of melanoma is often a change in a mole or a new dark spot.  The ABCDE system is a way of remembering the signs of melanoma.  A for asymmetry:  The two halves do not match. B for border:  The edges of the growth are irregular. C for color:  A mixture of colors are present instead of an even brown color. D for diameter:  Melanomas are usually (but not always) greater than 6mm - the size of a pencil eraser. E for evolution:  The spot keeps changing in size, shape, and color.  Please check your skin once per month between visits. You can use a small mirror in front and a large mirror behind you to keep an eye on the back side or your body.   If you see any new or changing lesions before your next follow-up, please call to schedule a visit.  Please continue daily skin protection including broad spectrum sunscreen SPF 30+ to sun-exposed areas, reapplying every 2 hours as needed when you're outdoors.   Staying in the shade or wearing long sleeves, sun glasses (UVA+UVB protection) and wide brim hats (4-inch brim around the entire circumference of the hat) are also recommended for sun protection.    Due to recent changes in healthcare laws, you may see results of your pathology and/or laboratory studies on MyChart before the doctors have had a chance to review them. We understand that in  some cases there may be results that are confusing or concerning to you. Please understand that not all results are received at the same time and often the doctors may need to interpret multiple results in order to provide you with the best plan of care or course of treatment. Therefore, we ask that you please give us  2 business days to thoroughly review all your results before contacting the office for clarification. Should we see a critical lab result, you will be contacted sooner.   If You Need Anything After Your Visit  If you have any questions or concerns for your doctor, please call our main line at [phone] and press option 4 to reach your doctor's medical assistant. If no one answers, please leave a voicemail as directed and we will return your call as soon as possible. Messages left after 4 pm will be answered the following business day.   You may also send us  a message via MyChart. We typically respond to MyChart messages within 1-2 business days.  For prescription refills, please ask your pharmacy to contact our office. Our fax number is [phone].  If you have an urgent issue when the clinic is closed that cannot wait until the next business day, you can page your doctor at the number below.    Please note that while we do our best to be available for urgent issues outside of office hours, we  are not available 24/7.   If you have an urgent issue and are unable to reach us , you may choose to seek medical care at your doctor's office, retail clinic, urgent care center, or emergency room.  If you have a medical emergency, please immediately call 911 or go to the emergency department.  Pager Numbers  - Dr. Hester: [phone]  - Dr. Jackquline: [phone]  - Dr. Claudene: [phone]   - Dr. Raymund: [phone]  In the event of inclement weather, please call our main line at [phone] for an update on the status of any delays or closures.  Dermatology Medication  Tips: Please keep the boxes that topical medications come in in order to help keep track of the instructions about where and how to use these. Pharmacies typically print the medication instructions only on the boxes and not directly on the medication tubes.   If your medication is too expensive, please contact our office at [phone] option 4 or send us  a message through MyChart.   We are unable to tell what your co-pay for medications will be in advance as this is different depending on your insurance coverage. However, we may be able to find a substitute medication at lower cost or fill out paperwork to get insurance to cover a needed medication.   If a prior authorization is required to get your medication covered by your insurance company, please allow us  1-2 business days to complete this process.  Drug prices often vary depending on where the prescription is filled and some pharmacies may offer cheaper prices.  The website www.goodrx.com contains coupons for medications through different pharmacies. The prices here do not account for what the cost may be with help from insurance (it may be cheaper with your insurance), but the website can give you the price if you did not use any insurance.  - You can print the associated coupon and take it with your prescription to the pharmacy.  - You may also stop by our office during regular business hours and pick up a GoodRx coupon card.  - If you need your prescription sent electronically to a different pharmacy, notify our office through [facility] or by phone at [phone] option 4.     Si Usted Necesita Algo Despus de Su Visita  Tambin puede enviarnos un mensaje a travs de Clinical cytogeneticist. Por lo general respondemos a los mensajes de MyChart en el transcurso de 1 a 2 das hbiles.  Para renovar recetas, por favor pida a su farmacia que se ponga en contacto con nuestra oficina. Randi lakes de fax es [place] [phone].  Si tiene un  asunto urgente cuando la clnica est cerrada y que no puede esperar hasta el siguiente da hbil, puede llamar/localizar a su doctor(a) al nmero que aparece a continuacin.   Por favor, tenga en cuenta que aunque hacemos todo lo posible para estar disponibles para asuntos urgentes fuera del horario de [place], no estamos disponibles las 24 horas del da, los 7 [address]  [address] de la [place].   Si tiene un problema urgente y no puede comunicarse con nosotros, puede optar por buscar atencin mdica  en el consultorio de su doctor(a), en una clnica privada, en un centro de atencin urgente o en una sala de emergencias.  Si tiene Engineer, drilling, por favor llame inmediatamente al 911 o vaya a la sala de emergencias.  Nmeros de bper  - Dr. Hester: [phone]  - Dra. Jackquline: [phone]  - Dr. Claudene: [phone]  -  Dra. Raymund: [phone]  En caso de inclemencias del tiempo, por favor llame a nuestra lnea principal al [phone] para una actualizacin sobre el [place] de cualquier retraso o cierre.  Consejos para la medicacin en dermatologa: Por favor, guarde las cajas en las que vienen los medicamentos de uso tpico para ayudarle a seguir las instrucciones sobre dnde y cmo usarlos. Las farmacias generalmente imprimen las instrucciones del medicamento slo en las cajas y no directamente en los tubos del Portal.   Si su medicamento es muy caro, por favor, pngase en contacto con landry rieger llamando al [phone] y presione la opcin 4 o envenos un mensaje a travs de Clinical cytogeneticist.   No podemos decirle cul ser su copago por los medicamentos por adelantado ya que esto es diferente dependiendo de la cobertura de su seguro. Sin embargo, es posible que podamos encontrar un medicamento sustituto a Audiological scientist un formulario para que el seguro cubra el medicamento que se considera necesario.   Si se requiere una autorizacin previa para que su compaa de seguros malta su  medicamento, por favor permtanos de 1 a 2 das hbiles para completar este proceso.  Los precios de los medicamentos varan con frecuencia dependiendo del Environmental consultant de dnde se surte la receta y alguna farmacias pueden ofrecer precios ms baratos.  El sitio web www.goodrx.com tiene cupones para medicamentos de Health and safety inspector. Los precios aqu no tienen en cuenta lo que podra costar con la ayuda del seguro (puede ser ms barato con su seguro), pero el sitio web puede darle el precio si no utiliz Tourist information centre manager.  - Puede imprimir el cupn correspondiente y llevarlo con su receta a la farmacia.  - Tambin puede pasar por nuestra oficina durante el horario de atencin regular y Education officer, museum una tarjeta de cupones de GoodRx.  - Si necesita que su receta se enve electrnicamente a una farmacia diferente, informe a nuestra oficina a travs de MyChart de [place] o por telfono llamando al [phone] y presione la opcin 4.

## 2024-05-04 NOTE — Progress Notes (Signed)
 Follow-Up Visit   Subjective  Emma Cuevas is a 58 y.o. female who presents for the following: Skin Cancer Screening and Full Body Skin Exam Hx of breast cancer Hx of aks Hx of rosacea Hx of seborrheic dermatitis  The patient presents for Total-Body Skin Exam (TBSE) for skin cancer screening and mole check. The patient has spots, moles and lesions to be evaluated, some may be new or changing and the patient may have concern these could be cancer.  The following portions of the chart were reviewed this encounter and updated as appropriate: medications, allergies, medical history  Review of Systems:  No other skin or systemic complaints except as noted in HPI or Assessment and Plan.  Objective  Well appearing patient in no apparent distress; mood and affect are within normal limits.  A full examination was performed including scalp, head, eyes, ears, nose, lips, neck, chest, axillae, abdomen, back, buttocks, bilateral upper extremities, bilateral lower extremities, hands, feet, fingers, toes, fingernails, and toenails. All findings within normal limits unless otherwise noted below.   Relevant physical exam findings are noted in the Assessment and Plan.   Assessment & Plan   HISTORY of BREAST CANCER R Breast -  2013 Exam: no lymphadenopathy Treatment Plan: Observe for recurrence, cont f/u with PCP/Oncology    SKIN CANCER SCREENING PERFORMED TODAY.  ACTINIC DAMAGE - Chronic condition, secondary to cumulative UV/sun exposure - diffuse scaly erythematous macules with underlying dyspigmentation - Recommend daily broad spectrum sunscreen SPF 30+ to sun-exposed areas, reapply every 2 hours as needed.  - Staying in the shade or wearing long sleeves, sun glasses (UVA+UVB protection) and wide brim hats (4-inch brim around the entire circumference of the hat) are also recommended for sun protection.  - Call for new or changing lesions.  LENTIGINES, SEBORRHEIC KERATOSES, HEMANGIOMAS  - Benign normal skin lesions - Benign-appearing - Call for any changes Sk observed on left posterior back of leg  Benign   MELANOCYTIC NEVI - Tan-brown and/or pink-flesh-colored symmetric macules and papules - Benign appearing on exam today - Observation - Call clinic for new or changing moles - Recommend daily use of broad spectrum spf 30+ sunscreen to sun-exposed areas.   TELANGIECTASIA Exam: dilated blood vessel(s) Treatment Plan: Benign appearing on exam Call for changes Counseling for BBL / IPL / Laser and Coordination of Care Discussed the treatment option of Broad Band Light (BBL) /Intense Pulsed Light (IPL)/ Laser for skin discoloration, including brown spots and redness.  Typically we recommend at least 1-3 treatment sessions about 5-8 weeks apart for best results.  Cannot have tanned skin when BBL performed, and regular use of sunscreen/photoprotection is advised after the procedure to help maintain results. The patient's condition may also require maintenance treatments in the future.  The fee for BBL / laser treatments is $350 per treatment session for the whole face.  A fee can be quoted for other parts of the body.  Insurance typically does not pay for BBL/laser treatments and therefore the fee is an out-of-pocket cost. Recommend prophylactic valtrex treatment. Once scheduled for procedure, will send Rx in prior to patient's appointment.    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 Currently well-controlled.  Rosacea is a chronic progressive skin condition usually affecting the face of adults, causing redness and/or acne bumps. It is treatable but not curable. It sometimes affects the eyes (ocular rosacea) as well. It may respond to topical and/or systemic medication  and can flare with stress, sun exposure, alcohol, exercise, topical steroids (including hydrocortisone/cortisone 10) and some foods.  Daily  application of broad spectrum spf 30+ sunscreen to face is recommended to reduce flares.   Patient denies grittiness of the eyes   Treatment Plan No treatment at this time, if flares may restart SM Triple cream  Renewed rx sent to [facility] today    SCAR L breast, secondary to surgery Exam: Dyspigmented smooth macule or patch. Benign-appearing.  Observation.  Call clinic for new or changing lesions. Recommend daily broad spectrum sunscreen SPF 30+, reapply every 2 hours as needed. Treatment: Recommend Serica moisturizing scar formula cream every night or [facility] brand or Mederma silicone scar sheet every night for the first year after a scar appears to help with scar remodeling if desired. Scars remodel on their own for a full year and will gradually improve in appearance over time.     Return in about 1 year (around 05/04/2025) for TBSE.  IEleanor Blush, CMA, am acting as scribe for Alm Rhyme, MD.   Documentation: I have reviewed the above documentation for accuracy and completeness, and I agree with the above.  Alm Rhyme, MD

## 2024-08-15 ENCOUNTER — Ambulatory Visit: Admitting: Oncology

## 2024-08-15 ENCOUNTER — Other Ambulatory Visit

## 2024-08-15 ENCOUNTER — Ambulatory Visit

## 2025-05-10 ENCOUNTER — Ambulatory Visit: Admitting: Dermatology
